# Patient Record
Sex: Female | Born: 1965 | Race: White | Hispanic: No | Marital: Single | Smoking: Never smoker
Health system: Southern US, Community
[De-identification: ages and names within clinical notes are randomized; demographics above are authoritative.]

## PROBLEM LIST (undated history)

## (undated) DIAGNOSIS — F32A Depression, unspecified: Secondary | ICD-10-CM

## (undated) DIAGNOSIS — K219 Gastro-esophageal reflux disease without esophagitis: Secondary | ICD-10-CM

## (undated) DIAGNOSIS — K649 Unspecified hemorrhoids: Secondary | ICD-10-CM

## (undated) DIAGNOSIS — N200 Calculus of kidney: Secondary | ICD-10-CM

## (undated) DIAGNOSIS — T4145XA Adverse effect of unspecified anesthetic, initial encounter: Secondary | ICD-10-CM

## (undated) DIAGNOSIS — R3129 Other microscopic hematuria: Secondary | ICD-10-CM

## (undated) DIAGNOSIS — C801 Malignant (primary) neoplasm, unspecified: Secondary | ICD-10-CM

## (undated) DIAGNOSIS — R3915 Urgency of urination: Secondary | ICD-10-CM

## (undated) DIAGNOSIS — E119 Type 2 diabetes mellitus without complications: Secondary | ICD-10-CM

## (undated) DIAGNOSIS — I10 Essential (primary) hypertension: Secondary | ICD-10-CM

## (undated) DIAGNOSIS — Z8719 Personal history of other diseases of the digestive system: Secondary | ICD-10-CM

## (undated) DIAGNOSIS — Z9889 Other specified postprocedural states: Secondary | ICD-10-CM

## (undated) DIAGNOSIS — Z85820 Personal history of malignant melanoma of skin: Secondary | ICD-10-CM

## (undated) DIAGNOSIS — N201 Calculus of ureter: Secondary | ICD-10-CM

## (undated) DIAGNOSIS — M797 Fibromyalgia: Secondary | ICD-10-CM

## (undated) DIAGNOSIS — T8859XA Other complications of anesthesia, initial encounter: Secondary | ICD-10-CM

## (undated) DIAGNOSIS — F329 Major depressive disorder, single episode, unspecified: Secondary | ICD-10-CM

## (undated) DIAGNOSIS — Z973 Presence of spectacles and contact lenses: Secondary | ICD-10-CM

## (undated) DIAGNOSIS — G43909 Migraine, unspecified, not intractable, without status migrainosus: Secondary | ICD-10-CM

## (undated) HISTORY — PX: TONSILLECTOMY: SUR1361

## (undated) HISTORY — DX: Gastro-esophageal reflux disease without esophagitis: K21.9

## (undated) HISTORY — PX: OTHER SURGICAL HISTORY: SHX169

## (undated) HISTORY — PX: HAND SURGERY: SHX662

## (undated) HISTORY — DX: Type 2 diabetes mellitus without complications: E11.9

## (undated) HISTORY — PX: ABDOMINAL HYSTERECTOMY: SHX81

## (undated) HISTORY — DX: Migraine, unspecified, not intractable, without status migrainosus: G43.909

---

## 2001-05-02 ENCOUNTER — Other Ambulatory Visit: Admission: RE | Admit: 2001-05-02 | Discharge: 2001-05-02 | Payer: Self-pay | Admitting: Obstetrics and Gynecology

## 2003-07-08 ENCOUNTER — Ambulatory Visit (HOSPITAL_COMMUNITY): Admission: RE | Admit: 2003-07-08 | Discharge: 2003-07-08 | Payer: Self-pay | Admitting: Obstetrics and Gynecology

## 2003-07-08 ENCOUNTER — Encounter: Payer: Self-pay | Admitting: Obstetrics and Gynecology

## 2005-03-03 ENCOUNTER — Ambulatory Visit (HOSPITAL_COMMUNITY): Payer: Self-pay | Admitting: Oncology

## 2005-03-03 ENCOUNTER — Encounter (HOSPITAL_COMMUNITY): Admission: RE | Admit: 2005-03-03 | Discharge: 2005-04-02 | Payer: Self-pay | Admitting: Oncology

## 2005-03-03 ENCOUNTER — Encounter: Admission: RE | Admit: 2005-03-03 | Discharge: 2005-03-03 | Payer: Self-pay | Admitting: Oncology

## 2005-05-14 ENCOUNTER — Ambulatory Visit (HOSPITAL_COMMUNITY): Admission: RE | Admit: 2005-05-14 | Discharge: 2005-05-14 | Payer: Self-pay | Admitting: Family Medicine

## 2005-06-15 ENCOUNTER — Ambulatory Visit: Payer: Self-pay | Admitting: Gastroenterology

## 2005-07-29 ENCOUNTER — Encounter (HOSPITAL_COMMUNITY): Admission: RE | Admit: 2005-07-29 | Discharge: 2005-08-28 | Payer: Self-pay | Admitting: Endocrinology

## 2008-12-28 ENCOUNTER — Emergency Department (HOSPITAL_COMMUNITY): Admission: EM | Admit: 2008-12-28 | Discharge: 2008-12-28 | Payer: Self-pay | Admitting: Emergency Medicine

## 2009-02-25 ENCOUNTER — Other Ambulatory Visit: Admission: RE | Admit: 2009-02-25 | Discharge: 2009-02-25 | Payer: Self-pay | Admitting: Obstetrics and Gynecology

## 2009-03-12 ENCOUNTER — Encounter: Payer: Self-pay | Admitting: Obstetrics & Gynecology

## 2009-03-12 ENCOUNTER — Ambulatory Visit (HOSPITAL_COMMUNITY): Admission: RE | Admit: 2009-03-12 | Discharge: 2009-03-12 | Payer: Self-pay | Admitting: Obstetrics & Gynecology

## 2009-03-12 HISTORY — PX: DILATION AND CURETTAGE OF UTERUS: SHX78

## 2009-03-15 ENCOUNTER — Emergency Department (HOSPITAL_COMMUNITY): Admission: EM | Admit: 2009-03-15 | Discharge: 2009-03-15 | Payer: Self-pay | Admitting: Emergency Medicine

## 2010-02-10 ENCOUNTER — Ambulatory Visit: Payer: Self-pay | Admitting: Gastroenterology

## 2010-02-10 ENCOUNTER — Inpatient Hospital Stay (HOSPITAL_COMMUNITY): Admission: AD | Admit: 2010-02-10 | Discharge: 2010-02-13 | Payer: Self-pay | Admitting: Family Medicine

## 2010-02-11 ENCOUNTER — Ambulatory Visit: Payer: Self-pay | Admitting: Gastroenterology

## 2010-02-12 ENCOUNTER — Ambulatory Visit: Payer: Self-pay | Admitting: Gastroenterology

## 2010-02-12 ENCOUNTER — Encounter (INDEPENDENT_AMBULATORY_CARE_PROVIDER_SITE_OTHER): Payer: Self-pay | Admitting: Family Medicine

## 2010-02-12 HISTORY — PX: OTHER SURGICAL HISTORY: SHX169

## 2010-03-02 ENCOUNTER — Encounter: Payer: Self-pay | Admitting: Gastroenterology

## 2010-03-02 ENCOUNTER — Telehealth: Payer: Self-pay | Admitting: Gastroenterology

## 2010-03-04 ENCOUNTER — Ambulatory Visit: Payer: Self-pay | Admitting: Gastroenterology

## 2010-03-04 ENCOUNTER — Ambulatory Visit (HOSPITAL_COMMUNITY): Admission: RE | Admit: 2010-03-04 | Discharge: 2010-03-04 | Payer: Self-pay | Admitting: Gastroenterology

## 2010-03-04 ENCOUNTER — Telehealth: Payer: Self-pay | Admitting: Gastroenterology

## 2010-03-06 ENCOUNTER — Encounter: Payer: Self-pay | Admitting: Gastroenterology

## 2010-03-06 ENCOUNTER — Ambulatory Visit (HOSPITAL_COMMUNITY): Admission: RE | Admit: 2010-03-06 | Discharge: 2010-03-06 | Payer: Self-pay | Admitting: Gastroenterology

## 2010-03-12 ENCOUNTER — Encounter: Payer: Self-pay | Admitting: Gastroenterology

## 2010-03-18 ENCOUNTER — Ambulatory Visit: Payer: Self-pay | Admitting: Gastroenterology

## 2010-05-05 ENCOUNTER — Ambulatory Visit: Payer: Self-pay | Admitting: Gastroenterology

## 2010-05-05 ENCOUNTER — Encounter (INDEPENDENT_AMBULATORY_CARE_PROVIDER_SITE_OTHER): Payer: Self-pay

## 2010-05-05 DIAGNOSIS — R1084 Generalized abdominal pain: Secondary | ICD-10-CM | POA: Insufficient documentation

## 2010-05-05 DIAGNOSIS — R197 Diarrhea, unspecified: Secondary | ICD-10-CM | POA: Insufficient documentation

## 2010-06-08 ENCOUNTER — Encounter: Payer: Self-pay | Admitting: Gastroenterology

## 2010-06-19 ENCOUNTER — Encounter: Payer: Self-pay | Admitting: Gastroenterology

## 2010-10-25 HISTORY — PX: CYST EXCISION: SHX5701

## 2010-11-24 NOTE — Letter (Signed)
Summary: GIVENS ORDER  GIVNES ORDER   Imported By: Ave Filter 03/04/2010 11:28:54  _____________________________________________________________________  External Attachment:    Type:   Image     Comment:   External Document

## 2010-11-24 NOTE — Progress Notes (Signed)
Summary: RECTAL BLEEDING  Called by Dr. Sudie Bailey. TCS WED 5/11-suprep, Dx: rectal bleeding. West Bali MD  Mar 02, 2010 10:10 AM  Appended Document: RECTAL BLEEDING Pt is on the schedule for 03/04/10@10 :45a.m.

## 2010-11-24 NOTE — Progress Notes (Signed)
Summary: CAPSULE ENDOSCOPY  Pt having capsule endoscopy Friday 03/06/10. Please read capsule. Pt has had a finding of enteritis on CT APR 2011. She continues with abd pain and loose stools. IleoTCS and enteroscopy revealed only H. pylori gastritis. West Bali MD  Mar 04, 2010 10:47 AM

## 2010-11-24 NOTE — Consult Note (Signed)
Summary: Consultation Report  Consultation Report   Imported By: Peggyann Shoals 03/06/2010 13:13:49  _____________________________________________________________________  External Attachment:    Type:   Image     Comment:   External Document  Appended Document: Consultation Report H. Pylori serologies positive in 4/11 hospitalization. Gastric bx also positive. Please find out if patient was treated.  Appended Document: Consultation Report EGD path from 4/11 needs to be in computer please.  Appended Document: Consultation Report LMOM to call.  Appended Document: Consultation Report Path Report scanned into emr.  Appended Document: Consultation Report Pt said she was treated for the H. Pylori with Amoxicillin, Biaxin, and Protonix.

## 2010-11-24 NOTE — Letter (Signed)
Summary: RECORDS FROM [facility] FROM [facility]   Imported By: Rexene Alberts 06/08/2010 12:27:13  _____________________________________________________________________  External Attachment:    Type:   Image     Comment:   External Document

## 2010-11-24 NOTE — Letter (Signed)
Summary: TRIAGE ORDER  TRIAGE ORDER   Imported By: Ave Filter 03/02/2010 11:07:24  _____________________________________________________________________  External Attachment:    Type:   Image     Comment:   External Document

## 2010-11-24 NOTE — Letter (Signed)
Summary: DISABILITY DETERMINATION  DISABILITY DETERMINATION   Imported By: Rexene Alberts 06/19/2010 15:03:50  _____________________________________________________________________  External Attachment:    Type:   Image     Comment:   External Document

## 2010-11-24 NOTE — Letter (Signed)
Summary: [facility] REFERRAL  [facility] REFERRAL   Imported By: Ave Filter 03/18/2010 15:26:34  _____________________________________________________________________  External Attachment:    Type:   Image     Comment:   External Document

## 2010-11-24 NOTE — Letter (Signed)
Summary: Out of Work Note  [facility]  [address]   [place], [place] [address]   Phone: [phone]  Fax: [phone]    05/05/2010  TO: Leodis Sias IT MAY CONCERN  RE: Courtney Hale [address] [place] Nov 19, 1965       The above named individual was seen in our office today.    If you have any further questions or need additional information, please call.     Sincerely,     [facility] R. Roetta Sessions, M.D.    Jonette Eva, M.D. Lorenza Burton, FNP-BC    Tana Coast, PA-C Phone: [phone]    Fax: [phone]

## 2010-11-24 NOTE — Assessment & Plan Note (Signed)
Summary: ABD PAIN, NVD likely 2o to IBS   Visit Type:  Follow-up Visit Primary Care Provider:  Sudie Bailey, M.D.  Chief Complaint:  follow up.  History of Present Illness: Pt still has bloating and pain. Bms: 12-13, 4-5 times per night: not a whole lot. Formed stool: 2x/week. No normal solid stool. No fever or chills. Has had vomtiing and nausea since she went back to work. Weight: 150s when first got sick and now gained  ~20 lbs. No heartburn or indigestion. No blood in stool or black stools. Appetite not good. "Doesn't eat much due to bloating". 10-15 mins after eats must be near a BR. On minocycline: June 9 for places on her arms: no change in stools, then last couple days NV. Taking ?Fiber pills: 3 every day-doesn't make bloating worse. No ice cream, rare cheese, or 2-3 cups a week: milk. [facility]: JUL 22.  Current Medications (verified): 1)  Minocycline Hcl 50 Mg Caps (Minocycline Hcl) .... Two Times A Day 2)  Protonix 40 Mg Tbec (Pantoprazole Sodium) .... Once Daily 3)  Zolpidem Tartrate 10 Mg Tabs (Zolpidem Tartrate) .... At Bedtime 4)  Ortho Tri-Cyclen (28) 0.18/0.215/0.25 Mg-35 Mcg Tabs (Norgestim-Eth Estrad Triphasic) .... Once Daily 5)  Citalopram Hydrobromide 20 Mg Tabs (Citalopram Hydrobromide) .... Once Daily 6)  Lisinopril 10 Mg Tabs (Lisinopril) .... Once Daily 7)  Clobetasol Propionade 0.5% .... Once Daily  Allergies (verified): No Known Drug Allergies  Past History:  Past Medical History: Epigstric pain, NV APR 2011:  CTAP w/ ivc- Diffuse thickening of small bowel loops in the mid abdomen, likely   mid to distal jejunum, with edema in the associated mesentery **PUSH ENTEROSCOPY APR 2011 unable to reach the jejunum; ILEOTCS 2011-NO LESIONS. Capsule endoscopy MAY 2011: SB ulcer at approximately 2 hours; pending DBE AT [facility] for small bowl enteritis. H. PYLORI GASTRITIS APR 2011-Rx by Dr. Sudie Bailey Skin lesions on body Right hepatic lobe hemangioma MRI 2006  Past Surgical  History: MAR 2010: D&C 2o to Miscarriage No Cholecystectomy  Vital Signs:  Patient profile:   45 year old female Height:      63.5 inches Weight:      175 pounds BMI:     30.62 Temp:     98.7 degrees F oral Pulse rate:   76 / minute BP sitting:   124 / 88  (left arm) Cuff size:   regular  Vitals Entered By: Hendricks Limes LPN (May 05, 2010 8:43 AM)  Physical Exam  General:  Well developed, well nourished, no acute distress. Head:  Normocephalic and atraumatic. Eyes:  PERRLA, no icterus. Mouth:  No deformity or lesions. Lungs:  Clear throughout to auscultation. Heart:  Regular rate and rhythm; no murmurs. Abdomen:  Soft, mild TTPx4 without guarding and without rebound, nondistended. Normal bowel sounds. obese.  Extremities:  No edema noted. Neurologic:  Alert and  oriented x4;  grossly normal neurologically. Skin:  Without significant lesions or rashes in the pretibial region.  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 SB enteritis still remains in differential diagnosis. Doubt SBBO. Continue fiber. Add Probiotics daily. Add Bentyl 10 mg 30 minutes breakfast, lunch and dinner. Med side effects discussed. Cut out dairy products. lactose free hand out given. Continue Protonix. Return in 2 mos. Will await results from DBE. NEXT UPPER ENDOSCOPY WITH PROPOFOL.  Orders: Est. Patient Level IV ([address]109)  Problem # 2:  DIARRHEA (ICD-787.91)  See #1.  CC: PCP  Orders: Est. Patient Level IV (29562)  Patient Instructions: 1)  Add Probiotics daily. 2)  Add Bentyl 10 mg 30 minutes breakfast, lunch and dinner. 3)  Cut out dairy products. 4)  Continue Protonix. 5)  Return in 2 mos. 6)  The medication list was reviewed and reconciled.  All changed / newly prescribed medications were explained.  A complete medication list was provided to the patient / caregiver. Prescriptions: DICYCLOMINE HCL 10 MG CAPS  (DICYCLOMINE HCL) 1 by mouth 30 minutes prior to breakfast, lunch, and dinner  #90 x 5   Entered and Authorized by:   West Bali MD   Signed by:   West Bali MD on 05/05/2010   Method used:   Electronically to        [facility]  [address] (retail)       [address]       [place], [place]  [address]       Ph: [phone]       Fax: [phone]   RxID:   [id number]   Appended Document: ABD PAIN, NVD likely 2o to IBS REMINDER APPOINTMENT IN THE COMPUTER

## 2010-11-24 NOTE — Op Note (Signed)
Summary: Operative Report  Operative Report   Imported By: Peggyann Shoals 03/06/2010 13:12:22  _____________________________________________________________________  External Attachment:    Type:   Image     Comment:   External Document

## 2011-01-12 LAB — DIFFERENTIAL
Basophils Absolute: 0 10*3/uL (ref 0.0–0.1)
Basophils Relative: 0 % (ref 0–1)
Eosinophils Absolute: 0 10*3/uL (ref 0.0–0.7)
Eosinophils Relative: 0 % (ref 0–5)
Lymphocytes Relative: 12 % (ref 12–46)
Lymphs Abs: 1.5 10*3/uL (ref 0.7–4.0)
Monocytes Absolute: 0.6 10*3/uL (ref 0.1–1.0)
Monocytes Relative: 4 % (ref 3–12)
Neutro Abs: 10.8 10*3/uL — ABNORMAL HIGH (ref 1.7–7.7)
Neutrophils Relative %: 83 % — ABNORMAL HIGH (ref 43–77)

## 2011-01-12 LAB — BASIC METABOLIC PANEL
BUN: 9 mg/dL (ref 6–23)
CO2: 23 mEq/L (ref 19–32)
Calcium: 8.7 mg/dL (ref 8.4–10.5)
Chloride: 104 mEq/L (ref 96–112)
Creatinine, Ser: 0.61 mg/dL (ref 0.4–1.2)
GFR calc Af Amer: >60 mL/min (ref >60)
GFR calc non Af Amer: >60 mL/min (ref >60)
Glucose, Bld: 105 mg/dL — ABNORMAL HIGH (ref 70–99)
Potassium: 3.6 mEq/L (ref 3.5–5.1)
Sodium: 134 mEq/L — ABNORMAL LOW (ref 135–145)

## 2011-01-12 LAB — CULTURE, BLOOD (ROUTINE X 2)
Culture: NO GROWTH
Culture: NO GROWTH
Report Status: 4242011
Report Status: 4242011

## 2011-01-12 LAB — PREGNANCY, URINE: Preg Test, Ur: NEGATIVE

## 2011-01-12 LAB — CBC
HCT: 36.6 % (ref 36.0–46.0)
Hemoglobin: 12.9 g/dL (ref 12.0–15.0)
MCHC: 35.2 g/dL (ref 30.0–36.0)
MCV: 84.8 fL (ref 78.0–100.0)
Platelets: 266 10*3/uL (ref 150–400)
RBC: 4.31 MIL/uL (ref 3.87–5.11)
RDW: 14.7 % (ref 11.5–15.5)
WBC: 12.9 10*3/uL — ABNORMAL HIGH (ref 4.0–10.5)

## 2011-01-12 LAB — URINALYSIS, ROUTINE W REFLEX MICROSCOPIC
Bilirubin Urine: NEGATIVE
Glucose, UA: NEGATIVE mg/dL
Hgb urine dipstick: NEGATIVE
Ketones, ur: NEGATIVE mg/dL
Nitrite: NEGATIVE
Protein, ur: NEGATIVE mg/dL
Specific Gravity, Urine: <1.005 — ABNORMAL LOW (ref 1.005–1.030)
Urobilinogen, UA: 0.2 mg/dL (ref 0.0–1.0)
pH: 6.5 (ref 5.0–8.0)

## 2011-01-12 LAB — URINE CULTURE

## 2011-01-12 LAB — C-REACTIVE PROTEIN: CRP: 0.4 mg/dL — ABNORMAL LOW (ref <0.6)

## 2011-01-12 LAB — H. PYLORI ANTIBODY, IGG: H Pylori IgG: 7 {ISR} — ABNORMAL HIGH

## 2011-01-12 LAB — SEDIMENTATION RATE: Sed Rate: 14 mm/hr (ref 0–22)

## 2011-02-02 LAB — URINALYSIS, MICROSCOPIC ONLY
Leukocytes, UA: NEGATIVE
Nitrite: NEGATIVE
Specific Gravity, Urine: 1.015 (ref 1.005–1.030)
Urine-Other: NONE SEEN
Urobilinogen, UA: 0.2 mg/dL (ref 0.0–1.0)
pH: 6.5 (ref 5.0–8.0)

## 2011-02-02 LAB — COMPREHENSIVE METABOLIC PANEL
ALT: 22 U/L (ref 0–35)
AST: 20 U/L (ref 0–37)
AST: 23 U/L (ref 0–37)
Albumin: 3.2 g/dL — ABNORMAL LOW (ref 3.5–5.2)
Alkaline Phosphatase: 51 U/L (ref 39–117)
BUN: 4 mg/dL — ABNORMAL LOW (ref 6–23)
BUN: 8 mg/dL (ref 6–23)
CO2: 23 mEq/L (ref 19–32)
Calcium: 9.1 mg/dL (ref 8.4–10.5)
Chloride: 103 mEq/L (ref 96–112)
Chloride: 107 mEq/L (ref 96–112)
Creatinine, Ser: 0.51 mg/dL (ref 0.4–1.2)
GFR calc Af Amer: >60 mL/min (ref >60)
GFR calc Af Amer: >60 mL/min (ref >60)
GFR calc non Af Amer: >60 mL/min (ref >60)
Potassium: 3.7 mEq/L (ref 3.5–5.1)
Sodium: 138 mEq/L (ref 135–145)
Total Bilirubin: 0.4 mg/dL (ref 0.3–1.2)
Total Protein: 5.9 g/dL — ABNORMAL LOW (ref 6.0–8.3)

## 2011-02-02 LAB — POCT CARDIAC MARKERS
CKMB, poc: <1 ng/mL — ABNORMAL LOW (ref 1.0–8.0)
Troponin i, poc: <0.05 ng/mL (ref 0.00–0.09)

## 2011-02-02 LAB — CBC
HCT: 35.3 % — ABNORMAL LOW (ref 36.0–46.0)
Hemoglobin: 12.7 g/dL (ref 12.0–15.0)
MCHC: 36.1 g/dL — ABNORMAL HIGH (ref 30.0–36.0)
MCV: 88.3 fL (ref 78.0–100.0)
Platelets: 235 10*3/uL (ref 150–400)
Platelets: 268 10*3/uL (ref 150–400)
RBC: 4 MIL/uL (ref 3.87–5.11)
RDW: 12.9 % (ref 11.5–15.5)
RDW: 13.2 % (ref 11.5–15.5)
WBC: 5.5 10*3/uL (ref 4.0–10.5)
WBC: 7 10*3/uL (ref 4.0–10.5)

## 2011-02-02 LAB — DIFFERENTIAL
Basophils Relative: 1 % (ref 0–1)
Eosinophils Relative: 1 % (ref 0–5)
Monocytes Absolute: 0.4 10*3/uL (ref 0.1–1.0)
Monocytes Relative: 7 % (ref 3–12)
Neutro Abs: 3.4 10*3/uL (ref 1.7–7.7)

## 2011-02-02 LAB — BRAIN NATRIURETIC PEPTIDE: Pro B Natriuretic peptide (BNP): 95.6 pg/mL (ref 0.0–100.0)

## 2011-02-04 LAB — PREGNANCY, URINE: Preg Test, Ur: POSITIVE

## 2011-02-04 LAB — CBC
HCT: 34.5 % — ABNORMAL LOW (ref 36.0–46.0)
MCHC: 34.3 g/dL (ref 30.0–36.0)
MCV: 91.1 fL (ref 78.0–100.0)
Platelets: 273 10*3/uL (ref 150–400)
RDW: 13.1 % (ref 11.5–15.5)
WBC: 7.3 10*3/uL (ref 4.0–10.5)

## 2011-02-04 LAB — BASIC METABOLIC PANEL
BUN: 8 mg/dL (ref 6–23)
Chloride: 106 mEq/L (ref 96–112)
Creatinine, Ser: 0.5 mg/dL (ref 0.4–1.2)
Glucose, Bld: 106 mg/dL — ABNORMAL HIGH (ref 70–99)

## 2011-02-04 LAB — DIFFERENTIAL
Basophils Absolute: 0 10*3/uL (ref 0.0–0.1)
Basophils Relative: 1 % (ref 0–1)
Eosinophils Absolute: 0.1 10*3/uL (ref 0.0–0.7)
Eosinophils Relative: 1 % (ref 0–5)
Lymphs Abs: 1.6 10*3/uL (ref 0.7–4.0)
Neutrophils Relative %: 70 % (ref 43–77)

## 2011-02-04 LAB — HCG, QUANTITATIVE, PREGNANCY: hCG, Beta Chain, Quant, S: 3546 m[IU]/mL — ABNORMAL HIGH (ref <5)

## 2011-03-09 NOTE — Op Note (Signed)
NAME:  Courtney Hale, Courtney Hale               ACCOUNT NO.:  [id number]   MEDICAL RECORD NO.:  [id number]          PATIENT TYPE:  AMB   LOCATION:  DAY                           FACILITY:  [facility]   PHYSICIAN:  Lazaro Arms, M.D.   DATE OF BIRTH:  [birth year]   DATE OF PROCEDURE:  03/12/2009  DATE OF DISCHARGE:  03/12/2009                               OPERATIVE REPORT   PREOPERATIVE DIAGNOSIS:  Missed abortion in the first trimester.   POSTOPERATIVE DIAGNOSIS:  Missed abortion in the first trimester.   PROCEDURE:  Cervical dilation with dilation and curettage.   SURGEON:  Lazaro Arms, MD   ANESTHESIA:  Laryngeal mask airway, general.   FINDINGS:  The patient had been seen in the office and had downward  trending hCG, no fetal cardiac activity after seeing cardiac activity  and a  sac that was becoming irregular.  The patient decided she want to  have D and C.  The uterus sounded to about 10 cm in the OR.  No  abnormalities.   DESCRIPTION OF OPERATION:  The patient was taken to the operating room,  placed in supine position, underwent general endotracheal anesthesia.  Placed in the lithotomy position, prepped and draped in usual sterile  fashion.  Cervix was grasped, dilated serially to allow passage of a #9  curved French suction curette.  Several passes were made, tissue was  removed.  Good uterine cry was obtained.  There was minimal bleed and  the patient was given Methergine IM.  She was taken to recovery room in  good and stable condition.  All counts were correct.  Blood loss was  minimal.      Lazaro Arms, M.D.  Electronically Signed     LHE/MEDQ  D:  03/12/2009  T:  03/13/2009  Job:  [id number]

## 2011-10-03 ENCOUNTER — Emergency Department (HOSPITAL_COMMUNITY)
Admission: EM | Admit: 2011-10-03 | Discharge: 2011-10-03 | Disposition: A | Discharge status: Home / Self Care | Payer: BC Managed Care – PPO | Attending: Emergency Medicine | Admitting: Emergency Medicine

## 2011-10-03 ENCOUNTER — Emergency Department (HOSPITAL_COMMUNITY): Payer: BC Managed Care – PPO

## 2011-10-03 DIAGNOSIS — T3795XA Adverse effect of unspecified systemic anti-infective and antiparasitic, initial encounter: Secondary | ICD-10-CM | POA: Insufficient documentation

## 2011-10-03 DIAGNOSIS — J111 Influenza due to unidentified influenza virus with other respiratory manifestations: Secondary | ICD-10-CM | POA: Insufficient documentation

## 2011-10-03 DIAGNOSIS — Z85820 Personal history of malignant melanoma of skin: Secondary | ICD-10-CM | POA: Insufficient documentation

## 2011-10-03 DIAGNOSIS — L270 Generalized skin eruption due to drugs and medicaments taken internally: Secondary | ICD-10-CM | POA: Insufficient documentation

## 2011-10-03 LAB — COMPREHENSIVE METABOLIC PANEL
ALT: 59 U/L — ABNORMAL HIGH (ref 0–35)
AST: 68 U/L — ABNORMAL HIGH (ref 0–37)
Albumin: 3.9 g/dL (ref 3.5–5.2)
Calcium: 9.1 mg/dL (ref 8.4–10.5)
Creatinine, Ser: 0.56 mg/dL (ref 0.50–1.10)
GFR calc non Af Amer: >90 mL/min (ref >90)
Sodium: 134 mEq/L — ABNORMAL LOW (ref 135–145)
Total Protein: 7.3 g/dL (ref 6.0–8.3)

## 2011-10-03 LAB — URINE MICROSCOPIC-ADD ON

## 2011-10-03 LAB — URINALYSIS, ROUTINE W REFLEX MICROSCOPIC
Bilirubin Urine: NEGATIVE
Specific Gravity, Urine: >1.03 — ABNORMAL HIGH (ref 1.005–1.030)
Urobilinogen, UA: 0.2 mg/dL (ref 0.0–1.0)
pH: 6 (ref 5.0–8.0)

## 2011-10-03 LAB — CBC
Hemoglobin: 13.4 g/dL (ref 12.0–15.0)
MCH: 31 pg (ref 26.0–34.0)
MCV: 90 fL (ref 78.0–100.0)
Platelets: 222 10*3/uL (ref 150–400)
RBC: 4.32 MIL/uL (ref 3.87–5.11)
WBC: 4.1 10*3/uL (ref 4.0–10.5)

## 2011-10-03 LAB — RAPID STREP SCREEN (MED CTR MEBANE ONLY): Streptococcus, Group A Screen (Direct): NEGATIVE

## 2011-10-03 MED ORDER — ONDANSETRON HCL 4 MG/2ML IJ SOLN
4.0000 mg | Freq: Once | INTRAMUSCULAR | Status: AC
  Administered 2011-10-03: 4 mg via INTRAVENOUS
  Filled 2011-10-03: qty 2

## 2011-10-03 MED ORDER — SODIUM CHLORIDE 0.9 % IV SOLN: INTRAVENOUS | Status: DC

## 2011-10-03 MED ORDER — HYDROCODONE-ACETAMINOPHEN 5-325 MG PO TABS: 1.0000 | ORAL_TABLET | ORAL | Status: AC | PRN

## 2011-10-03 MED ORDER — FAMOTIDINE IN NACL 20-0.9 MG/50ML-% IV SOLN
20.0000 mg | Freq: Once | INTRAVENOUS | Status: AC
  Administered 2011-10-03: 20 mg via INTRAVENOUS
  Filled 2011-10-03: qty 50

## 2011-10-03 MED ORDER — DIPHENHYDRAMINE HCL 50 MG/ML IJ SOLN
25.0000 mg | Freq: Once | INTRAMUSCULAR | Status: AC
  Administered 2011-10-03: 25 mg via INTRAVENOUS
  Filled 2011-10-03: qty 1

## 2011-10-03 MED ORDER — SODIUM CHLORIDE 0.9 % IV BOLUS (SEPSIS)
1000.0000 mL | Freq: Once | INTRAVENOUS | Status: AC
  Administered 2011-10-03: 1000 mL via INTRAVENOUS

## 2011-10-03 MED ORDER — FAMOTIDINE 20 MG PO TABS: 20.0000 mg | ORAL_TABLET | Freq: Two times a day (BID) | ORAL | Status: DC

## 2011-10-03 MED ORDER — HYDROMORPHONE HCL PF 1 MG/ML IJ SOLN
1.0000 mg | Freq: Once | INTRAMUSCULAR | Status: AC
  Administered 2011-10-03: 1 mg via INTRAVENOUS
  Filled 2011-10-03: qty 1

## 2011-10-03 MED ORDER — DIPHENHYDRAMINE HCL 25 MG PO TABS: 25.0000 mg | ORAL_TABLET | Freq: Four times a day (QID) | ORAL | Status: AC

## 2011-10-03 NOTE — ED Provider Notes (Signed)
History   This chart was scribed for Courtney Jakes, MD scribed by [name]. The patient was seen in room [facility]    CSN: [id number] Arrival date & time: 10/03/2011 11:41 AM   First MD Initiated Contact with Patient 10/03/11 1205      Chief Complaint  Patient presents with  . Rash  . Shortness of Breath  . Dysphagia  . Cough  . Generalized Body Aches    (Consider location/radiation/quality/duration/timing/severity/associated sxs/prior treatment) Patient is a 45 y.o. female presenting with allergic reaction. The history is provided by the patient. No language interpreter was used.  Allergic Reaction The primary symptoms are  cough and rash. The current episode started 13 to 24 hours ago. The problem has not changed since onset.This is a new problem.  The rash appears on the torso, face, back, chest, left arm, right arm, right leg and right foot. Risk factors for rash include new medications (Started 10 day course of Septra 11/29. Started Tamiflu last night. ).  The onset of the reaction was associated with a new medication. Associated symptoms comments: Burning senstation.   Pt seen at 1:01 PM Pt was sen on 11/29 by PCP Dr. Gerda Diss for congestion and cough, dx with sinus infection and placed on Septra. 3 days ago Pt reports her Sx worsened and she developed a high fever ~103, chills, and myalgias. Pt called PCP and was Rx'd with tamiflu. Last night Pt took tamiflu, cough syrup and Septra and woke with a severe, generalized rash. Pt reports taking her last dose of Septra and a second dose of tamiflu this morning. Pt currently c/o of rash, fever, chills, cough, and myalgias.    Past Medical History  Diagnosis Date  . Melanoma     Past Surgical History  Procedure Date  . Melanoma excision     No family history on file.  History  Substance Use Topics  . Smoking status: Never Smoker   . Smokeless tobacco: Not on file  . Alcohol Use: No    Review of Systems    Constitutional: Positive for fever and chills.  HENT: Positive for trouble swallowing.   Respiratory: Positive for cough.   Musculoskeletal: Positive for myalgias.  Skin: Positive for rash.  All other systems reviewed and are negative.    Allergies  Review of patient's allergies indicates no known allergies.  Home Medications   Current Outpatient Rx  Name Route Sig Dispense Refill  . LISINOPRIL 20 MG PO TABS Oral Take 20 mg by mouth daily.      . OSELTAMIVIR PHOSPHATE 75 MG PO CAPS Oral Take 75 mg by mouth 2 (two) times daily.      . SULFAMETHOXAZOLE-TMP DS 800-160 MG PO TABS Oral Take 1 tablet by mouth 2 (two) times daily. Patient started on 09/23/11 for 10 days.     Marland Kitchen DIPHENHYDRAMINE HCL 25 MG PO TABS Oral Take 1 tablet (25 mg total) by mouth every 6 (six) hours. 20 tablet 0  . FAMOTIDINE 20 MG PO TABS Oral Take 1 tablet (20 mg total) by mouth 2 (two) times daily. 30 tablet 0    BP 137/81  Pulse 110  Temp(Src) 101.1 F (38.4 C) (Oral)  Resp 20  Ht 5\' 3"  (1.6 m)  Wt 173 lb (78.472 kg)  BMI 30.65 kg/m2  SpO2 99%  LMP 09/30/2011  Physical Exam  Nursing note and vitals reviewed. Constitutional: She is oriented to person, place, and time. She appears well-developed and well-nourished. No distress.  HENT:  Head: Normocephalic and atraumatic.  Mouth/Throat: Oropharynx is clear and moist. No oropharyngeal exudate.  Eyes: EOM are normal. Pupils are equal, round, and reactive to light.       Conjunctiva is erythematous    Cardiovascular: Normal rate, regular rhythm and normal heart sounds.   Pulmonary/Chest: Effort normal and breath sounds normal.  Abdominal: Soft. Bowel sounds are normal. There is no tenderness.  Musculoskeletal: Normal range of motion.  Neurological: She is alert and oriented to person, place, and time. No cranial nerve deficit. Coordination normal.  Skin: Skin is warm. Rash noted. There is erythema (.).       Macular erythemas rash, which does blanch.  Most intense on her anterior chest and face, but present everywhere.  Psychiatric: She has a normal mood and affect.    ED Course  Procedures (including critical care time) DIAGNOSTIC STUDIES: Oxygen Saturation is 99% on room air, normal by my interpretation.    COORDINATION OF CARE:  Results for orders placed during the hospital encounter of 10/03/11  COMPREHENSIVE METABOLIC PANEL      Component Value Range   Sodium 134 (*) 135 - 145 (mEq/L)   Potassium 3.9  3.5 - 5.1 (mEq/L)   Chloride 102  96 - 112 (mEq/L)   CO2 21  19 - 32 (mEq/L)   Glucose, Bld 122 (*) 70 - 99 (mg/dL)   BUN 8  6 - 23 (mg/dL)   Creatinine, Ser 4.09  0.50 - 1.10 (mg/dL)   Calcium 9.1  8.4 - 81.1 (mg/dL)   Total Protein 7.3  6.0 - 8.3 (g/dL)   Albumin 3.9  3.5 - 5.2 (g/dL)   AST 68 (*) 0 - 37 (U/L)   ALT 59 (*) 0 - 35 (U/L)   Alkaline Phosphatase 125 (*) 39 - 117 (U/L)   Total Bilirubin 0.2 (*) 0.3 - 1.2 (mg/dL)   GFR calc non Af Amer >90  >90 (mL/min)   GFR calc Af Amer >90  >90 (mL/min)  URINALYSIS, ROUTINE W REFLEX MICROSCOPIC      Component Value Range   Color, Urine YELLOW  YELLOW    APPearance CLOUDY (*) CLEAR    Specific Gravity, Urine >1.030 (*) 1.005 - 1.030    pH 6.0  5.0 - 8.0    Glucose, UA NEGATIVE  NEGATIVE (mg/dL)   Hgb urine dipstick LARGE (*) NEGATIVE    Bilirubin Urine NEGATIVE  NEGATIVE    Ketones, ur NEGATIVE  NEGATIVE (mg/dL)   Protein, ur TRACE (*) NEGATIVE (mg/dL)   Urobilinogen, UA 0.2  0.0 - 1.0 (mg/dL)   Nitrite NEGATIVE  NEGATIVE    Leukocytes, UA NEGATIVE  NEGATIVE   CBC      Component Value Range   WBC 4.1  4.0 - 10.5 (K/uL)   RBC 4.32  3.87 - 5.11 (MIL/uL)   Hemoglobin 13.4  12.0 - 15.0 (g/dL)   HCT 91.4  78.2 - 95.6 (%)   MCV 90.0  78.0 - 100.0 (fL)   MCH 31.0  26.0 - 34.0 (pg)   MCHC 34.4  30.0 - 36.0 (g/dL)   RDW 21.3  08.6 - 57.8 (%)   Platelets 222  150 - 400 (K/uL)  RAPID STREP SCREEN      Component Value Range   Streptococcus, Group A Screen (Direct)  NEGATIVE  NEGATIVE   URINE MICROSCOPIC-ADD ON      Component Value Range   Squamous Epithelial / LPF RARE  RARE    WBC, UA 3-6  <  3 (WBC/hpf)   RBC / HPF 21-50  <3 (RBC/hpf)   Bacteria, UA FEW (*) RARE    Dg Chest 2 View  10/03/2011  *RADIOLOGY REPORT*  Clinical Data: Cough, fever, malaise.  CHEST - 2 VIEW  Comparison:  05/14/2005  Findings:  The heart size and mediastinal contours are within normal limits.  Both lungs are clear.  The visualized skeletal structures are unremarkable.  IMPRESSION: No active cardiopulmonary disease.  Original Report Authenticated By: Danae Orleans, M.D.     ED MEDICATIONS  Medications  0.9 %  sodium chloride infusion   sodium chloride 0.9 % bolus 1,000 mL   ondansetron (ZOFRAN) injection 4 mg  HYDROmorphone (DILAUDID) injection 1 mg   famotidine (PEPCID) IVPB 20 mg   diphenhydrAMINE (BENADRYL) injection 25 mg      1. Allergic drug rash due to sulfonamide   2. Influenza       MDM   Allergic drug reaction to sulfur, with skin rash, no hives no lip or tongue swelling no blood pressure problems no difficulty breathing. Patient just completed 10 day course of Septra the drug a [place] started approximately 2 days ago. She also would most likely onset of influenza, started on Tamiflu by her primary care. The Septra was for a questionable sinus infection. Patient does not have a history consistent with [facility] spotted fever and the rash is not consistent with a petechial kind of rash. Most fitting for a sulfur skin drug rash. No evidence of any blind or blistering. Patient white count is on the low side which is fitting with a viral process. Electrolytes without any specific findings. Urinalysis with 21-50 red blood cells of the bacteria is rare and leukocyte esterase is negative. Do not feel this is consistent with a UTI, but patient's menses. Patient observed in the emergency department is improved with fluids currently nontoxic no acute distress    I  personally performed the services described in this documentation, which was scribed in my presence. The recorded information has been reviewed and considered.          Courtney Jakes, MD 10/03/11 [phone]

## 2011-10-03 NOTE — ED Notes (Signed)
Pt presents with rash and burning pain to skin. Pt was given Bactrim for URI last week. Pt did not get any better and was given Tamiflu yesterday. Shortly after taking 1st does pt started breaking out in rash.

## 2011-10-03 NOTE — ED Notes (Signed)
Pt c/o red burning rash to face and over body. Pt states she first noticed burning prior to taking first dose of Tamiflu. Pt states she has been taking several doses of 2gm tylenol a day for fever. Risk of tylenol dose explained to pt. Pt voiced understanding.

## 2011-10-03 NOTE — ED Notes (Signed)
Pt also reports difficulty swallowing and SOB. NAD at this time. Pt states she is becoming nauseated.

## 2012-12-05 ENCOUNTER — Emergency Department (HOSPITAL_COMMUNITY)
Admission: EM | Admit: 2012-12-05 | Discharge: 2012-12-05 | Disposition: A | Discharge status: Home / Self Care | Payer: BC Managed Care – PPO | Attending: Emergency Medicine | Admitting: Emergency Medicine

## 2012-12-05 ENCOUNTER — Encounter (HOSPITAL_COMMUNITY): Payer: Self-pay | Admitting: *Deleted

## 2012-12-05 DIAGNOSIS — R5381 Other malaise: Secondary | ICD-10-CM | POA: Insufficient documentation

## 2012-12-05 DIAGNOSIS — F329 Major depressive disorder, single episode, unspecified: Secondary | ICD-10-CM | POA: Insufficient documentation

## 2012-12-05 DIAGNOSIS — R51 Headache: Secondary | ICD-10-CM | POA: Insufficient documentation

## 2012-12-05 DIAGNOSIS — R5383 Other fatigue: Secondary | ICD-10-CM | POA: Insufficient documentation

## 2012-12-05 DIAGNOSIS — I10 Essential (primary) hypertension: Secondary | ICD-10-CM | POA: Insufficient documentation

## 2012-12-05 DIAGNOSIS — Z85820 Personal history of malignant melanoma of skin: Secondary | ICD-10-CM | POA: Insufficient documentation

## 2012-12-05 DIAGNOSIS — F3289 Other specified depressive episodes: Secondary | ICD-10-CM | POA: Insufficient documentation

## 2012-12-05 DIAGNOSIS — R110 Nausea: Secondary | ICD-10-CM | POA: Insufficient documentation

## 2012-12-05 DIAGNOSIS — Z79899 Other long term (current) drug therapy: Secondary | ICD-10-CM | POA: Insufficient documentation

## 2012-12-05 HISTORY — DX: Essential (primary) hypertension: I10

## 2012-12-05 HISTORY — DX: Depression, unspecified: F32.A

## 2012-12-05 HISTORY — DX: Major depressive disorder, single episode, unspecified: F32.9

## 2012-12-05 LAB — CBC WITH DIFFERENTIAL/PLATELET
Basophils Absolute: 0 10*3/uL (ref 0.0–0.1)
Basophils Relative: 0 % (ref 0–1)
Eosinophils Absolute: 0.1 10*3/uL (ref 0.0–0.7)
HCT: 36 % (ref 36.0–46.0)
Hemoglobin: 12.5 g/dL (ref 12.0–15.0)
MCH: 30 pg (ref 26.0–34.0)
MCHC: 34.7 g/dL (ref 30.0–36.0)
Monocytes Absolute: 0.4 10*3/uL (ref 0.1–1.0)
Monocytes Relative: 7 % (ref 3–12)
Neutrophils Relative %: 57 % (ref 43–77)
RDW: 13 % (ref 11.5–15.5)

## 2012-12-05 LAB — COMPREHENSIVE METABOLIC PANEL
Albumin: 3.7 g/dL (ref 3.5–5.2)
BUN: 6 mg/dL (ref 6–23)
Calcium: 8.9 mg/dL (ref 8.4–10.5)
Creatinine, Ser: 0.5 mg/dL (ref 0.50–1.10)
Total Protein: 7 g/dL (ref 6.0–8.3)

## 2012-12-05 MED ORDER — METOCLOPRAMIDE HCL 5 MG/ML IJ SOLN
10.0000 mg | Freq: Once | INTRAMUSCULAR | Status: AC
  Administered 2012-12-05: 10 mg via INTRAVENOUS
  Filled 2012-12-05: qty 2

## 2012-12-05 MED ORDER — DIPHENHYDRAMINE HCL 50 MG/ML IJ SOLN
25.0000 mg | Freq: Once | INTRAMUSCULAR | Status: AC
  Administered 2012-12-05: 25 mg via INTRAVENOUS
  Filled 2012-12-05: qty 1

## 2012-12-05 MED ORDER — KETOROLAC TROMETHAMINE 30 MG/ML IJ SOLN
30.0000 mg | Freq: Once | INTRAMUSCULAR | Status: AC
  Administered 2012-12-05: 30 mg via INTRAVENOUS
  Filled 2012-12-05: qty 1

## 2012-12-05 MED ORDER — SODIUM CHLORIDE 0.9 % IV BOLUS (SEPSIS)
1000.0000 mL | Freq: Once | INTRAVENOUS | Status: AC
  Administered 2012-12-05: 1000 mL via INTRAVENOUS

## 2012-12-05 NOTE — ED Notes (Signed)
Pt reports having headache for 12 days & blood pressure running high. Was told by nurse to come to the ER.

## 2012-12-05 NOTE — ED Notes (Signed)
Pt c/o ongoing headache for over a week with an increasing BP.  Pt states today she began to feel dizzy with nausea and pain radiating from her head to her left shoulder.

## 2012-12-05 NOTE — ED Provider Notes (Signed)
History    This chart was scribed for [name], MD by [name], ED Scribe. This patient was seen in room APA02/APA02 and the patient's care was started 9:04 PM.   CSN: [id number]  Arrival date & time 12/05/12  2026   First MD Initiated Contact with Patient 12/05/12 2059      Chief Complaint  Patient presents with  . Headache  . Hypertension     The history is provided by the patient. No language interpreter was used.    Courtney Hale is a 47 y.o. female who presents to the Emergency Department complaining of ongoing, constant, unchanged HA that moves around and radiates down her left neck starting 12 days ago.  Her headache is currently in her temples the past 2 days. She rated her HA as 10/10 this morning but currently is a 6/10 after taking ibuprofen with relief. She states that while working, pt states all of a sudden she felt very weak and lost her energy 3 days ago. She checked her BP and it was 159/46. 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 She was seen by a neurologist and told to "tough it out". States she usually gets it with her period, but this time it has persisted.  Usually she doesn't have nausea with her HA unlike this time. States she was woken up last night by her hands being stiff. She has associated feelings of being off balance, nausea. She denies taking any different medications or using any new detergents. She states she feels like she is "burning up". She denies vomiting, numbness or tingling in upper extremities.    PCP Dr. Gerda Diss.    Past Medical History  Diagnosis Date  . Melanoma   . Hypertension   . Depression     Past Surgical History  Procedure Laterality Date  . Melanoma excision    . Tonsillectomy    . Skin biopsy    . Cyst excision      No family history on file.  History  Substance Use Topics  . Smoking status: Never Smoker   . Smokeless tobacco: Not on file  . Alcohol Use: No   employed  No OB history provided.   Review of Systems A complete 10 system review of systems was obtained and all systems are negative except as noted in the HPI and PMH.   Allergies  Sulfa antibiotics  Home Medications   Current Outpatient Rx  Name  Route  Sig  Dispense  Refill  . citalopram (CELEXA) 20 MG tablet   Oral   Take 20 mg by mouth every morning.         Marland Kitchen ibuprofen (ADVIL,MOTRIN) 200 MG tablet   Oral   Take 800 mg by mouth as needed for pain or headache.         . lisinopril (PRINIVIL,ZESTRIL) 20 MG tablet   Oral   Take 20 mg by mouth every morning.          . Norgestimate-Ethinyl Estradiol Triphasic (TRI-SPRINTEC) 0.18/0.215/0.25 MG-35 MCG tablet   Oral   Take 1 tablet by mouth every morning.           BP 171/84  Pulse 81  Temp(Src) 98 F (36.7 C) (Oral)  Resp 17  Ht 5\' 3"  (1.6 m)  Wt 170 lb (77.111 kg)  BMI 30.12 kg/m2  SpO2 97%  LMP 12/03/2012  Physical Exam  Nursing note and vitals reviewed.  Constitutional: She is oriented to person, place, and time. She appears well-developed and well-nourished.  Non-toxic appearance. She does not appear ill. No distress.  HENT:  Head: Normocephalic and atraumatic.  Right Ear: External ear normal.  Left Ear: External ear normal.  Nose: Nose normal. No mucosal edema or rhinorrhea.  Mouth/Throat: Oropharynx is clear and moist and mucous membranes are normal. No dental abscesses or edematous.  Eyes: Conjunctivae and EOM are normal. Pupils are equal, round, and reactive to light.  Neck: Normal range of motion and full passive range of motion without pain. Neck supple.  Cardiovascular: Normal rate, regular rhythm and normal heart sounds.  Exam reveals no gallop and no friction rub.   No murmur heard. Pulmonary/Chest: Effort normal and breath sounds normal. No respiratory distress. She has no wheezes. She has no rhonchi. She has no rales. She exhibits no tenderness and no crepitus.  Abdominal: Soft. Normal  appearance and bowel sounds are normal. She exhibits no distension. There is no tenderness. There is no rebound and no guarding.  Musculoskeletal: Normal range of motion. She exhibits no edema and no tenderness.  Moves all extremities well. Tenderness to left trapezius.   Neurological: She is alert and oriented to person, place, and time. She has normal strength. No cranial nerve deficit.  Skin: Skin is warm, dry and intact. No pallor.  Has redness and flushing of cheeks, upper chest and upper back.   Psychiatric: She has a normal mood and affect. Her speech is normal and behavior is normal. Her mood appears not anxious.    ED Course  Procedures (including critical care time)  Medications  sodium chloride 0.9 % bolus 1,000 mL (1,000 mLs Intravenous New Bag/Given 12/05/12 2141)  metoCLOPramide (REGLAN) injection 10 mg (10 mg Intravenous Given 12/05/12 2141)  diphenhydrAMINE (BENADRYL) injection 25 mg (25 mg Intravenous Given 12/05/12 2141)  ketorolac (TORADOL) 30 MG/ML injection 30 mg (30 mg Intravenous Given 12/05/12 2141)    DIAGNOSTIC STUDIES: Oxygen Saturation is 100% on room air, normal by my interpretation.    COORDINATION OF CARE: 9:12 PM Discussed treatment plan which includes CBC panel, comprehensive metabolic panel with pt at bedside and pt agreed to plan.    10:32PM Upon recheck, pt states the HA is  gone. Advised of lab results. Feels ready to go home.   Last BP is 133/84   Results for orders placed during the hospital encounter of 12/05/12  CBC WITH DIFFERENTIAL      Result Value Range   WBC 6.5  4.0 - 10.5 K/uL   RBC 4.16  3.87 - 5.11 MIL/uL   Hemoglobin 12.5  12.0 - 15.0 g/dL   HCT 45.4  09.8 - 11.9 %   MCV 86.5  78.0 - 100.0 fL   MCH 30.0  26.0 - 34.0 pg   MCHC 34.7  30.0 - 36.0 g/dL   RDW 14.7  82.9 - 56.2 %   Platelets 284  150 - 400 K/uL   Neutrophils Relative 57  43 - 77 %   Neutro Abs 3.7  1.7 - 7.7 K/uL   Lymphocytes Relative 35  12 - 46 %   Lymphs Abs 2.3   0.7 - 4.0 K/uL   Monocytes Relative 7  3 - 12 %   Monocytes Absolute 0.4  0.1 - 1.0 K/uL   Eosinophils Relative 1  0 - 5 %   Eosinophils Absolute 0.1  0.0 - 0.7 K/uL   Basophils Relative 0  0 - 1 %  Basophils Absolute 0.0  0.0 - 0.1 K/uL  COMPREHENSIVE METABOLIC PANEL      Result Value Range   Sodium 136  135 - 145 mEq/L   Potassium 3.3 (*) 3.5 - 5.1 mEq/L   Chloride 101  96 - 112 mEq/L   CO2 24  19 - 32 mEq/L   Glucose, Bld 100 (*) 70 - 99 mg/dL   BUN 6  6 - 23 mg/dL   Creatinine, Ser 9.14  0.50 - 1.10 mg/dL   Calcium 8.9  8.4 - 78.2 mg/dL   Total Protein 7.0  6.0 - 8.3 g/dL   Albumin 3.7  3.5 - 5.2 g/dL   AST 14  0 - 37 U/L   ALT 14  0 - 35 U/L   Alkaline Phosphatase 65  39 - 117 U/L   Total Bilirubin 0.3  0.3 - 1.2 mg/dL   GFR calc non Af Amer >90  >90 mL/min   GFR calc Af Amer >90  >90 mL/min      Laboratory interpretation all normal except mild hypokalemia    1. Headache   2. Hypertension     Plan discharge  Devoria Albe, MD, FACEP     MDM   I personally performed the services described in this documentation, which was scribed in my presence. The recorded information has been reviewed and considered.  Devoria Albe, MD, Armando Gang   [name], MD 12/05/12 [phone]

## 2013-01-27 ENCOUNTER — Encounter: Payer: Self-pay | Admitting: *Deleted

## 2013-01-31 ENCOUNTER — Ambulatory Visit (INDEPENDENT_AMBULATORY_CARE_PROVIDER_SITE_OTHER): Payer: BC Managed Care – PPO | Admitting: Nurse Practitioner

## 2013-01-31 ENCOUNTER — Encounter: Payer: Self-pay | Admitting: Nurse Practitioner

## 2013-01-31 VITALS — BP 140/92 | HR 90 | Ht 63.5 in | Wt 166.2 lb

## 2013-01-31 DIAGNOSIS — I10 Essential (primary) hypertension: Secondary | ICD-10-CM | POA: Insufficient documentation

## 2013-01-31 DIAGNOSIS — G43709 Chronic migraine without aura, not intractable, without status migrainosus: Secondary | ICD-10-CM

## 2013-01-31 MED ORDER — PHENTERMINE HCL 37.5 MG PO TABS: 37.5000 mg | ORAL_TABLET | Freq: Every day | ORAL | Status: DC

## 2013-01-31 MED ORDER — RIZATRIPTAN BENZOATE 10 MG PO TBDP: 10.0000 mg | ORAL_TABLET | ORAL | Status: DC | PRN

## 2013-01-31 MED ORDER — NAPROXEN 500 MG PO TABS: 500.0000 mg | ORAL_TABLET | Freq: Two times a day (BID) | ORAL | Status: DC

## 2013-01-31 MED ORDER — TOPIRAMATE 25 MG PO TABS: ORAL_TABLET | ORAL | Status: DC

## 2013-01-31 NOTE — Patient Instructions (Signed)
Migraine Headache A migraine headache is an intense, throbbing pain on one or both sides of your head. A migraine can last for 30 minutes to several hours. CAUSES  The exact cause of a migraine headache is not always known. However, a migraine may be caused when nerves in the brain become irritated and release chemicals that cause inflammation. This causes pain. SYMPTOMS  Pain on one or both sides of your head.  Pulsating or throbbing pain.  Severe pain that prevents daily activities.  Pain that is aggravated by any physical activity.  Nausea, vomiting, or both.  Dizziness.  Pain with exposure to bright lights, loud noises, or activity.  General sensitivity to bright lights, loud noises, or smells. Before you get a migraine, you may get warning signs that a migraine is coming (aura). An aura may include:  Seeing flashing lights.  Seeing bright spots, halos, or zig-zag lines.  Having tunnel vision or blurred vision.  Having feelings of numbness or tingling.  Having trouble talking.  Having muscle weakness. MIGRAINE TRIGGERS  Alcohol.  Smoking.  Stress.  Menstruation.  Aged cheeses.  Foods or drinks that contain nitrates, glutamate, aspartame, or tyramine.  Lack of sleep.  Chocolate.  Caffeine.  Hunger.  Physical exertion.  Fatigue.  Medicines used to treat chest pain (nitroglycerine), birth control pills, estrogen, and some blood pressure medicines. DIAGNOSIS  A migraine headache is often diagnosed based on:  Symptoms.  Physical examination.  A CT scan or MRI of your head. TREATMENT Medicines may be given for pain and nausea. Medicines can also be given to help prevent recurrent migraines.  HOME CARE INSTRUCTIONS  Only take over-the-counter or prescription medicines for pain or discomfort as directed by your caregiver. The use of long-term narcotics is not recommended.  Lie down in a dark, quiet room when you have a migraine.  Keep a journal  to find out what may trigger your migraine headaches. For example, write down:  What you eat and drink.  How much sleep you get.  Any change to your diet or medicines.  Limit alcohol consumption.  Quit smoking if you smoke.  Get 7 to 9 hours of sleep, or as recommended by your caregiver.  Limit stress.  Keep lights dim if bright lights bother you and make your migraines worse. SEEK IMMEDIATE MEDICAL CARE IF:   Your migraine becomes severe.  You have a fever.  You have a stiff neck.  You have vision loss.  You have muscular weakness or loss of muscle control.  You start losing your balance or have trouble walking.  You feel faint or pass out.  You have severe symptoms that are different from your first symptoms. MAKE SURE YOU:   Understand these instructions.  Will watch your condition.  Will get help right away if you are not doing well or get worse. Document Released: 10/11/2005 Document Revised: 01/03/2012 Document Reviewed: 10/01/2011 Exit[employer] Patient Information 2013 [employer].  

## 2013-01-31 NOTE — Assessment & Plan Note (Signed)
Continue verapamil and lisinopril as directed.

## 2013-01-31 NOTE — Progress Notes (Signed)
Subjective:  Presents for recheck. Has a history of migraine headache without aura for over 12 years. Averaging 1-2 headaches per month. Has had a complete workup in the past including MRI. Symptoms including throbbing pounding pain with photosensitivity photophobia and nausea. Rare vomiting. No visual changes. No numbness or weakness of the face arms or legs. No relief with verapamil see previous note. Currently on birth control pills. Denies any missed pills. No sexual activity for the past 3 years. Has migraine the week before her cycle. Has not identified any other specific triggers for other migraines. Also would like to continue phentermine for a while longer. Very active job. Also doing walking with her family. Doing well with her diet.  Objective:   BP 140/92  Pulse 90  Ht 5' 3.5" (1.613 m)  Wt 166 lb 4 oz (75.411 kg)  BMI 28.98 kg/m2 NAD. Alert, oriented. Lungs clear. Heart regular rate rhythm.

## 2013-01-31 NOTE — Assessment & Plan Note (Signed)
Continue verapamil for blood pressure. Add Topamax with titrated dose. Maxalt and naproxen as directed for migraine. Patient to keep headache diary and try to do 5 any other specific triggers besides her menses. Lengthy discussion regarding risk of birth defects associated with Topamax. If patient becomes sexually active, recommend other form of birth control. Recheck in 3 weeks, call back sooner if any problems.

## 2013-02-21 ENCOUNTER — Encounter: Payer: Self-pay | Admitting: Nurse Practitioner

## 2013-02-21 ENCOUNTER — Ambulatory Visit (INDEPENDENT_AMBULATORY_CARE_PROVIDER_SITE_OTHER): Payer: BC Managed Care – PPO | Admitting: Nurse Practitioner

## 2013-02-21 VITALS — BP 118/82 | Wt 162.4 lb

## 2013-02-21 DIAGNOSIS — G43709 Chronic migraine without aura, not intractable, without status migrainosus: Secondary | ICD-10-CM

## 2013-02-21 DIAGNOSIS — G43829 Menstrual migraine, not intractable, without status migrainosus: Secondary | ICD-10-CM

## 2013-02-21 MED ORDER — ESTRADIOL 0.025 MG/24HR TD PTWK: 1.0000 | MEDICATED_PATCH | TRANSDERMAL | Status: DC

## 2013-02-21 MED ORDER — TOPIRAMATE 50 MG PO TABS: 50.0000 mg | ORAL_TABLET | Freq: Two times a day (BID) | ORAL | Status: DC

## 2013-02-21 MED ORDER — RIZATRIPTAN BENZOATE 10 MG PO TBDP: 10.0000 mg | ORAL_TABLET | ORAL | Status: DC | PRN

## 2013-02-22 ENCOUNTER — Encounter: Payer: Self-pay | Admitting: Nurse Practitioner

## 2013-02-22 DIAGNOSIS — G43829 Menstrual migraine, not intractable, without status migrainosus: Secondary | ICD-10-CM | POA: Insufficient documentation

## 2013-02-22 NOTE — Assessment & Plan Note (Signed)
Continue tri-Sprintec as directed. Add low-dose estrogen patch at the end of the pack as directed.

## 2013-02-22 NOTE — Progress Notes (Signed)
Subjective:  Presents for recheck on her migraine headaches. Overall doing much better. Other than around the time of her menstrual cycle, has not had any migraine headaches. These headaches were improved with Maxalt. Has used about 4 doses total. Currently on tri-Sprintec for birth control. Cycles are regular. Her current menstrual cycle started yesterday, began having headaches 3 days ago. They usually start the day after she takes her last active pill. Has had off-and-on migraines since she took her last pill on 4/26. Also taking Topamax.  Objective:   BP 118/82  Wt 162 lb 6.4 oz (73.664 kg)  BMI 28.31 kg/m2 NAD. Alert, oriented. Lungs clear. Heart regular rate rhythm.  Assessment:Headache, chronic migraine without aura  Menstrual migraine  Plan: Meds ordered this encounter  Medications  . rizatriptan (MAXALT-MLT) 10 MG disintegrating tablet    Sig: Take 1 tablet (10 mg total) by mouth as needed for migraine. May repeat in 2 hours if needed. Max 2 per 24 hours    Dispense:  10 tablet    Refill:  11    Order Specific Question:  Supervising Provider    Answer:  Merlyn Albert [[id number]]  . topiramate (TOPAMAX) 50 MG tablet    Sig: Take 1 tablet (50 mg total) by mouth 2 (two) times daily.    Dispense:  60 tablet    Refill:  5    Order Specific Question:  Supervising Provider    Answer:  Merlyn Albert [[id number]]  . estradiol (CLIMARA - DOSED IN MG/24 HR) 0.025 mg/24hr    Sig: Place 1 patch (0.025 mg total) onto the skin once a week. During menses    Dispense:  4 patch    Refill:  12    Order Specific Question:  Supervising Provider    Answer:  Merlyn Albert [[id number]]   continue Maxalt as directed when necessary. Increase Topamax to 50 mg 1 by mouth twice a day. Again reminded about risk of birth defects with pregnancy. Discussed options regarding menstrual migraines. Patient to apply a low-dose estrogen patch on Saturday when she finishes her last active pill with her next pack. To  call back and let us know if this helps. Other options include increasing dose of estrogen patch or continuous birth control pills.

## 2013-02-22 NOTE — Assessment & Plan Note (Signed)
Improving with Topamax, dosage increased to 50 mg twice a day. Continues to have menstrual migraines.

## 2013-04-26 ENCOUNTER — Telehealth: Payer: Self-pay | Admitting: Family Medicine

## 2013-04-26 NOTE — Telephone Encounter (Signed)
Outgoing call to patient, received voicemail. Left message for the patient to return a call to the office ([facility]).  Her FMLA paperwork is ready for pick-up.  The patient will need to pay the office fee/sign the office form/RFM will be more than glad to mail out her paper work to the [employer] disability/leave service center, but we will not fax in the information.

## 2013-06-18 ENCOUNTER — Ambulatory Visit (INDEPENDENT_AMBULATORY_CARE_PROVIDER_SITE_OTHER): Payer: BC Managed Care – PPO | Admitting: Family Medicine

## 2013-06-18 ENCOUNTER — Encounter: Payer: Self-pay | Admitting: Family Medicine

## 2013-06-18 VITALS — BP 110/78 | Ht 63.5 in | Wt 157.8 lb

## 2013-06-18 DIAGNOSIS — S60559A Superficial foreign body of unspecified hand, initial encounter: Secondary | ICD-10-CM

## 2013-06-18 DIAGNOSIS — S60551A Superficial foreign body of right hand, initial encounter: Secondary | ICD-10-CM

## 2013-06-18 NOTE — Progress Notes (Signed)
  Subjective:    Patient ID: Courtney Hale, female    DOB: [birth year], 47 y.o.   MRN: [id number]  HPI Patient arrives with a splinter in right ring finger since last night. Hit hand on a wood shelf and splinther went under the finger nail.   Review of Systems Last tetanus shot a couple years ago ROS otherwise negative    Objective:   Physical Exam Procedure note patient was prepped draped anesthetized. Fine forceps were inserted underneath the fingernail. Very deep splinter was removed dressing applied wound care discussed       Assessment & Plan:  Impression foreign body removal plan warning signs discussed local measures discussed. WSL

## 2013-07-31 ENCOUNTER — Other Ambulatory Visit: Payer: Self-pay | Admitting: Family Medicine

## 2013-08-28 ENCOUNTER — Other Ambulatory Visit: Payer: Self-pay | Admitting: *Deleted

## 2013-08-28 ENCOUNTER — Telehealth: Payer: Self-pay | Admitting: Family Medicine

## 2013-08-28 MED ORDER — TOPIRAMATE 50 MG PO TABS: 50.0000 mg | ORAL_TABLET | Freq: Two times a day (BID) | ORAL | Status: DC

## 2013-08-28 NOTE — Telephone Encounter (Signed)
Med sent to pharm. Pt notified on voicemail.  

## 2013-08-28 NOTE — Telephone Encounter (Signed)
°  topiramate (TOPAMAX) 50 MG tablet     Pt states [facility] has sent this over several times we have not replied, i don't see any indication  That we have gotten a request, sending this back now. Call pt when done

## 2013-08-29 ENCOUNTER — Other Ambulatory Visit: Payer: Self-pay | Admitting: *Deleted

## 2013-08-29 ENCOUNTER — Telehealth: Payer: Self-pay | Admitting: *Deleted

## 2013-08-29 ENCOUNTER — Encounter: Payer: Self-pay | Admitting: Orthopedic Surgery

## 2013-08-29 ENCOUNTER — Ambulatory Visit (INDEPENDENT_AMBULATORY_CARE_PROVIDER_SITE_OTHER): Payer: BC Managed Care – PPO | Admitting: Orthopedic Surgery

## 2013-08-29 ENCOUNTER — Ambulatory Visit (INDEPENDENT_AMBULATORY_CARE_PROVIDER_SITE_OTHER): Payer: BC Managed Care – PPO

## 2013-08-29 VITALS — BP 126/82 | Ht 63.5 in | Wt 158.0 lb

## 2013-08-29 DIAGNOSIS — S6991XA Unspecified injury of right wrist, hand and finger(s), initial encounter: Secondary | ICD-10-CM

## 2013-08-29 DIAGNOSIS — M79641 Pain in right hand: Secondary | ICD-10-CM

## 2013-08-29 DIAGNOSIS — S6980XA Other specified injuries of unspecified wrist, hand and finger(s), initial encounter: Secondary | ICD-10-CM

## 2013-08-29 DIAGNOSIS — M79609 Pain in unspecified limb: Secondary | ICD-10-CM

## 2013-08-29 DIAGNOSIS — S6990XA Unspecified injury of unspecified wrist, hand and finger(s), initial encounter: Secondary | ICD-10-CM | POA: Insufficient documentation

## 2013-08-29 NOTE — Progress Notes (Signed)
Patient ID: Courtney Hale, female   DOB: Jul 02, 1966, 47 y.o.   MRN: [id number]   Chief Complaint  Patient presents with  . Hand Pain    Right thumb and hand pain,Numbness.  Had injury back in May 2014    HISTORY: This is a 47 year old female who is right-hand-dominant who works in [employer]. She was in the freezer at work in May and when she came out something pop at the base of her right thumb and since that time she had increasing pain decreasing motion and has noticed a defect in the bulk of the thumb musculature on the volar aspect of the hand. She complains of sharp stabbing 8/10 constant pain and she started to notice some numbness and tingling in the thumb and index finger with pain radiating distal to proximal not proximal to distal denies neck pain  History blurred vision eye pain nausea depression and seasonal allergies her remaining review of systems reported as negative  Allergies to sulfa  She had a right index finger trigger finger/cyst excision  Current medications are lisinopril, Celexa, verapamil, Maxalt, Naprosyn, Topamax,  Family history of heart disease, arthritis, cancer, diabetes  Social history divorced. Department manager the bakery at [employer] She does not smoke Alcohol use is light  BP 126/82  Ht 5' 3.5" (1.613 m)  Wt 158 lb (71.668 kg)  BMI 27.55 kg/m2 General appearance is normal, the patient is alert and oriented x3 with normal mood and affect. There is actual muscle bulk defect in the volar aspect of the thumb. Bilaterally however she has decreased metacarpophalangeal joint flexion. There is tenderness at the defect. The IP joint flexor is intact. Skin is normal. Pulses normal. Sensation normal to soft touch. She has no epitrochlear lymph nodes. There is no deformity. She does have some mild pain with palpation over the carpal metacarpal joint.  The x-rays do not show any major abnormality  Impression I think she is ruptured one of the O.-A.-F.  muscles of the thumb.  Recommend hand surgical followup  Splint until seen by hand surgery.

## 2013-08-29 NOTE — Telephone Encounter (Signed)
Faxed referral and office notes to [facility]. Awaiting appointment. 

## 2013-08-29 NOTE — Patient Instructions (Signed)
Refer to hand specialist for thumb flexor muscle tear. Dr Amanda Pea

## 2013-09-10 ENCOUNTER — Other Ambulatory Visit: Payer: Self-pay | Admitting: Family Medicine

## 2013-09-12 NOTE — Telephone Encounter (Signed)
Patient has an appointment with Dr. Amanda Pea 09/15/13 at 10:45 am.

## 2013-10-15 ENCOUNTER — Other Ambulatory Visit: Payer: Self-pay | Admitting: Family Medicine

## 2013-10-29 HISTORY — PX: CARPAL TUNNEL RELEASE: SHX101

## 2014-03-18 ENCOUNTER — Other Ambulatory Visit: Payer: Self-pay | Admitting: Family Medicine

## 2014-03-19 NOTE — Telephone Encounter (Signed)
Last seen for headaches 01/2013

## 2014-08-09 ENCOUNTER — Ambulatory Visit (INDEPENDENT_AMBULATORY_CARE_PROVIDER_SITE_OTHER): Payer: BC Managed Care – PPO | Admitting: Nurse Practitioner

## 2014-08-09 ENCOUNTER — Encounter: Payer: Self-pay | Admitting: Nurse Practitioner

## 2014-08-09 VITALS — BP 112/80 | Ht 63.5 in | Wt 169.0 lb

## 2014-08-09 DIAGNOSIS — I10 Essential (primary) hypertension: Secondary | ICD-10-CM

## 2014-08-09 DIAGNOSIS — E669 Obesity, unspecified: Secondary | ICD-10-CM

## 2014-08-09 DIAGNOSIS — G43709 Chronic migraine without aura, not intractable, without status migrainosus: Secondary | ICD-10-CM

## 2014-08-09 DIAGNOSIS — N943 Premenstrual tension syndrome: Secondary | ICD-10-CM

## 2014-08-09 DIAGNOSIS — G43829 Menstrual migraine, not intractable, without status migrainosus: Secondary | ICD-10-CM

## 2014-08-09 MED ORDER — NAPROXEN 500 MG PO TABS: 500.0000 mg | ORAL_TABLET | Freq: Two times a day (BID) | ORAL | Status: DC

## 2014-08-09 MED ORDER — NORGESTIMATE-ETH ESTRADIOL 0.25-35 MG-MCG PO TABS: ORAL_TABLET | ORAL | Status: DC

## 2014-08-09 MED ORDER — VERAPAMIL HCL ER 180 MG PO TBCR: EXTENDED_RELEASE_TABLET | ORAL | Status: DC

## 2014-08-09 MED ORDER — PHENTERMINE HCL 37.5 MG PO TABS: 37.5000 mg | ORAL_TABLET | Freq: Every day | ORAL | Status: DC

## 2014-08-09 MED ORDER — RIZATRIPTAN BENZOATE 10 MG PO TBDP
10.0000 mg | ORAL_TABLET | ORAL | Status: DC | PRN
Start: 2014-08-09 — End: 2015-05-23

## 2014-08-09 MED ORDER — TOPIRAMATE 50 MG PO TABS: ORAL_TABLET | ORAL | Status: DC

## 2014-08-09 NOTE — Patient Instructions (Signed)
nexplanon mirena Endometrial ablation [email or web address] Continuous birth control pills

## 2014-08-12 ENCOUNTER — Encounter: Payer: Self-pay | Admitting: Nurse Practitioner

## 2014-08-12 NOTE — Progress Notes (Signed)
Subjective:  Presents for routine followup. Sees gynecology for her preventive health physicals. Regular mammograms. Nonsmoker. Currently on tri-Sprintec. Having heavy menstrual cycles as well as menstrual migraines at start right before her menses. That is the main time that she is having migraines at this point. occurs a few days before her cycle begins. Some relief with Maxalt. Also on Topamax and verapamil. No chest pain/ischemic type pain or unusual shortness of breath. No edema. Same sexual partner. Staying active. Doing well with her diet. Would like to continue phentermine for now. Denies any adverse affects.  Objective:   BP 112/80  Ht 5' 3.5" (1.613 m)  Wt 169 lb (76.658 kg)  BMI 29.46 kg/m2 NAD. Alert, oriented. Lungs clear. Heart regular rate rhythm. Lower extremities no edema.  Assessment:  Problem List Items Addressed This Visit     Cardiovascular and Mediastinum   Headache, chronic migraine without aura - Primary   Relevant Medications      verapamil (CALAN-SR) CR tablet      topiramate (TOPAMAX) tablet      rizatriptan (MAXALT MLT) disintegrating tablet      naproxen (NAPROSYN) tablet   Hypertension   Relevant Medications      verapamil (CALAN-SR) CR tablet   Menstrual migraine   Relevant Medications      verapamil (CALAN-SR) CR tablet      topiramate (TOPAMAX) tablet      rizatriptan (MAXALT MLT) disintegrating tablet      naproxen (NAPROSYN) tablet    Other Visit Diagnoses   Obesity        Relevant Medications       phentermine (ADIPEX-P) 37.5 MG tablet         Plan:  Meds ordered this encounter  Medications  . verapamil (CALAN-SR) 180 MG CR tablet    Sig: TAKE ONE TABLET BY MOUTH ONCE DAILY    Dispense:  30 tablet    Refill:  5    Order Specific Question:  Supervising Provider    Answer:  [name] [[id number]]  . topiramate (TOPAMAX) 50 MG tablet    Sig: TAKE ONE TABLET BY MOUTH TWICE DAILY    Dispense:  60 tablet    Refill:  5    Order Specific  Question:  Supervising Provider    Answer:  [name] [[id number]]  . rizatriptan (MAXALT-MLT) 10 MG disintegrating tablet    Sig: Take 1 tablet (10 mg total) by mouth as needed for migraine. May repeat in 2 hours if needed. Max 2 per 24 hours    Dispense:  10 tablet    Refill:  11    Order Specific Question:  Supervising Provider    Answer:  [name] [[id number]]  . naproxen (NAPROSYN) 500 MG tablet    Sig: Take 1 tablet (500 mg total) by mouth 2 (two) times daily with a meal. Prn pain or migraine    Dispense:  30 tablet    Refill:  2    Order Specific Question:  Supervising Provider    Answer:  [name] [[id number]]  . phentermine (ADIPEX-P) 37.5 MG tablet    Sig: Take 1 tablet (37.5 mg total) by mouth daily before breakfast.    Dispense:  30 tablet    Refill:  2    Order Specific Question:  Supervising Provider    Answer:  [name] [[id number]]  . norgestimate-ethinyl estradiol (ORTHO-CYCLEN,SPRINTEC,PREVIFEM) 0.25-35 MG-MCG tablet    Sig: One po qd continuous  Dispense:  1 Package    Refill:  11    Needs to take continuous pills due to menorrhagia and migraines; please send PA if needed    Order Specific Question:  Supervising Provider    Answer:  [name] [[id number]]  . Epinastine HCl 0.05 % ophthalmic solution    Sig:    Switch to continuous Sprintec with her next pack of pills to keep a steady dose of hormones and to hopefully stop her cycle. Patient to let her gynecologist note at next visit. Call back if bleeding or headaches persist. Understands risks associated with hormone use. Discussed other options, encouraged patient to research these for her next visit with her gynecologist. Take Maxalt and naproxen at onset of migraine. Return in about 3 months (around 11/09/2014). Call back sooner if any problems.

## 2014-08-19 ENCOUNTER — Telehealth: Payer: Self-pay | Admitting: *Deleted

## 2014-08-19 ENCOUNTER — Emergency Department (HOSPITAL_COMMUNITY)
Admission: EM | Admit: 2014-08-19 | Discharge: 2014-08-19 | Disposition: A | Discharge status: Home / Self Care | Payer: BC Managed Care – PPO | Attending: Emergency Medicine | Admitting: Emergency Medicine

## 2014-08-19 ENCOUNTER — Emergency Department (HOSPITAL_COMMUNITY): Payer: BC Managed Care – PPO

## 2014-08-19 ENCOUNTER — Encounter (HOSPITAL_COMMUNITY): Payer: Self-pay | Admitting: Emergency Medicine

## 2014-08-19 DIAGNOSIS — K529 Noninfective gastroenteritis and colitis, unspecified: Secondary | ICD-10-CM

## 2014-08-19 DIAGNOSIS — F329 Major depressive disorder, single episode, unspecified: Secondary | ICD-10-CM | POA: Diagnosis not present

## 2014-08-19 DIAGNOSIS — Z79899 Other long term (current) drug therapy: Secondary | ICD-10-CM | POA: Insufficient documentation

## 2014-08-19 DIAGNOSIS — I10 Essential (primary) hypertension: Secondary | ICD-10-CM | POA: Diagnosis not present

## 2014-08-19 DIAGNOSIS — K5289 Other specified noninfective gastroenteritis and colitis: Secondary | ICD-10-CM | POA: Insufficient documentation

## 2014-08-19 DIAGNOSIS — Z85820 Personal history of malignant melanoma of skin: Secondary | ICD-10-CM | POA: Diagnosis not present

## 2014-08-19 DIAGNOSIS — Z3202 Encounter for pregnancy test, result negative: Secondary | ICD-10-CM | POA: Insufficient documentation

## 2014-08-19 DIAGNOSIS — R1084 Generalized abdominal pain: Secondary | ICD-10-CM | POA: Diagnosis present

## 2014-08-19 DIAGNOSIS — Z880 Allergy status to penicillin: Secondary | ICD-10-CM | POA: Insufficient documentation

## 2014-08-19 LAB — URINALYSIS, ROUTINE W REFLEX MICROSCOPIC
Bilirubin Urine: NEGATIVE
GLUCOSE, UA: NEGATIVE mg/dL
KETONES UR: NEGATIVE mg/dL
NITRITE: NEGATIVE
PH: 5 (ref 5.0–8.0)
Urobilinogen, UA: 0.2 mg/dL (ref 0.0–1.0)

## 2014-08-19 LAB — COMPREHENSIVE METABOLIC PANEL
ALBUMIN: 3.9 g/dL (ref 3.5–5.2)
ALK PHOS: 62 U/L (ref 39–117)
ALT: 12 U/L (ref 0–35)
ANION GAP: 15 (ref 5–15)
AST: 14 U/L (ref 0–37)
BILIRUBIN TOTAL: 0.3 mg/dL (ref 0.3–1.2)
BUN: 17 mg/dL (ref 6–23)
CHLORIDE: 104 meq/L (ref 96–112)
CO2: 19 mEq/L (ref 19–32)
CREATININE: 0.86 mg/dL (ref 0.50–1.10)
Calcium: 9.2 mg/dL (ref 8.4–10.5)
GFR calc Af Amer: >90 mL/min (ref >90)
GFR calc non Af Amer: 80 mL/min — ABNORMAL LOW (ref >90)
Glucose, Bld: 108 mg/dL — ABNORMAL HIGH (ref 70–99)
POTASSIUM: 3.4 meq/L — AB (ref 3.7–5.3)
Sodium: 138 mEq/L (ref 137–147)
TOTAL PROTEIN: 7.8 g/dL (ref 6.0–8.3)

## 2014-08-19 LAB — CBC WITH DIFFERENTIAL/PLATELET
BASOS ABS: 0 10*3/uL (ref 0.0–0.1)
BASOS PCT: 0 % (ref 0–1)
EOS ABS: 0.1 10*3/uL (ref 0.0–0.7)
Eosinophils Relative: 1 % (ref 0–5)
HCT: 39.5 % (ref 36.0–46.0)
Hemoglobin: 13.6 g/dL (ref 12.0–15.0)
Lymphocytes Relative: 28 % (ref 12–46)
Lymphs Abs: 3 10*3/uL (ref 0.7–4.0)
MCH: 29.8 pg (ref 26.0–34.0)
MCHC: 34.4 g/dL (ref 30.0–36.0)
MCV: 86.6 fL (ref 78.0–100.0)
Monocytes Absolute: 0.7 10*3/uL (ref 0.1–1.0)
Monocytes Relative: 7 % (ref 3–12)
NEUTROS ABS: 6.9 10*3/uL (ref 1.7–7.7)
NEUTROS PCT: 64 % (ref 43–77)
Platelets: 377 10*3/uL (ref 150–400)
RBC: 4.56 MIL/uL (ref 3.87–5.11)
RDW: 13.7 % (ref 11.5–15.5)
WBC: 10.7 10*3/uL — ABNORMAL HIGH (ref 4.0–10.5)

## 2014-08-19 LAB — URINE MICROSCOPIC-ADD ON

## 2014-08-19 LAB — PREGNANCY, URINE: Preg Test, Ur: NEGATIVE

## 2014-08-19 LAB — LIPASE, BLOOD: Lipase: 30 U/L (ref 11–59)

## 2014-08-19 MED ORDER — ONDANSETRON 4 MG PO TBDP: 4.0000 mg | ORAL_TABLET | Freq: Three times a day (TID) | ORAL | Status: DC | PRN

## 2014-08-19 MED ORDER — CIPROFLOXACIN HCL 500 MG PO TABS: 500.0000 mg | ORAL_TABLET | Freq: Two times a day (BID) | ORAL | Status: DC

## 2014-08-19 MED ORDER — ONDANSETRON HCL 4 MG/2ML IJ SOLN
4.0000 mg | Freq: Once | INTRAMUSCULAR | Status: AC
  Administered 2014-08-19: 4 mg via INTRAVENOUS
  Filled 2014-08-19: qty 2

## 2014-08-19 MED ORDER — OXYCODONE-ACETAMINOPHEN 5-325 MG PO TABS: 1.0000 | ORAL_TABLET | ORAL | Status: DC | PRN

## 2014-08-19 MED ORDER — METRONIDAZOLE 500 MG PO TABS: 500.0000 mg | ORAL_TABLET | Freq: Two times a day (BID) | ORAL | Status: DC

## 2014-08-19 MED ORDER — CIPROFLOXACIN HCL 250 MG PO TABS
500.0000 mg | ORAL_TABLET | Freq: Once | ORAL | Status: AC
  Administered 2014-08-19: 500 mg via ORAL
  Filled 2014-08-19: qty 2

## 2014-08-19 MED ORDER — HYDROMORPHONE HCL 1 MG/ML IJ SOLN
1.0000 mg | Freq: Once | INTRAMUSCULAR | Status: AC
Start: 2014-08-19 — End: 2014-08-19
  Administered 2014-08-19: 1 mg via INTRAVENOUS
  Filled 2014-08-19: qty 1

## 2014-08-19 MED ORDER — SODIUM CHLORIDE 0.9 % IV BOLUS (SEPSIS)
1000.0000 mL | Freq: Once | INTRAVENOUS | Status: AC
  Administered 2014-08-19: 1000 mL via INTRAVENOUS

## 2014-08-19 MED ORDER — IOHEXOL 300 MG/ML  SOLN
100.0000 mL | Freq: Once | INTRAMUSCULAR | Status: AC | PRN
  Administered 2014-08-19: 100 mL via INTRAVENOUS

## 2014-08-19 MED ORDER — METRONIDAZOLE 500 MG PO TABS
500.0000 mg | ORAL_TABLET | Freq: Once | ORAL | Status: AC
  Administered 2014-08-19: 500 mg via ORAL
  Filled 2014-08-19: qty 1

## 2014-08-19 MED ORDER — DOCUSATE SODIUM 100 MG PO CAPS: 100.0000 mg | ORAL_CAPSULE | Freq: Two times a day (BID) | ORAL | Status: DC

## 2014-08-19 MED ORDER — MORPHINE SULFATE 4 MG/ML IJ SOLN
4.0000 mg | Freq: Once | INTRAMUSCULAR | Status: AC
  Administered 2014-08-19: 4 mg via INTRAVENOUS
  Filled 2014-08-19: qty 1

## 2014-08-19 NOTE — ED Notes (Signed)
PT c/o abdominal bloating and pain with nausea/vomiting and no diarrhea x2 days. PT c/o diaphoresis last night and periumbical pain. PT reports having a normal BM yesterday.

## 2014-08-19 NOTE — ED Provider Notes (Signed)
This chart was scribed for [place], DO by Rayfield Citizen, ED Scribe. This patient was seen in room APA04/APA04 and the patient's care was started at 10:41 AM.   TIME SEEN: 10:41 AM  CHIEF COMPLAINT: Abdominal pain   HPI:   HPI Comments: Courtney Hale is a 48 y.o. female who presents to the Emergency Department complaining of 1 day of gradual onset abdominal pain. She reports associated nausea and vomiting. She states that she cannot eat or drink without pain; her bowel movements are normal. She reports that her stomach was distended upon waking this morning. She reports several years ago she had similar symptoms and had an endoscopy and colonoscopy by Dr. Oneida Alar. She states at that time she was tested for Crohn's disease and this was found to be negative.  She denies diarrhea, bloody or black tarry stools, urinary symptoms, vaginal discharge, sick contacts, recent travel, or prior abdominal surgery. She denies chest pain or shortness of breath.  ROS: See HPI Constitutional: no fever  Eyes: no drainage  ENT: no runny nose   Cardiovascular:  no chest pain  Resp: no SOB  GI: VOMITING  GU: no dysuria Integumentary: no rash  Allergy: no hives  Musculoskeletal: no leg swelling  Neurological: no slurred speech ROS otherwise negative  PAST MEDICAL HISTORY/PAST SURGICAL HISTORY:  Past Medical History  Diagnosis Date  . Melanoma   . Hypertension   . Depression     MEDICATIONS:  Prior to Admission medications   Medication Sig Start Date End Date Taking? Authorizing Provider  citalopram (CELEXA) 20 MG tablet Take 20 mg by mouth every morning.    Historical Provider, MD  Epinastine HCl 0.05 % ophthalmic solution  05/09/14   Historical Provider, MD  ibuprofen (ADVIL,MOTRIN) 200 MG tablet Take 800 mg by mouth as needed for pain or headache.    Historical Provider, MD  lisinopril (PRINIVIL,ZESTRIL) 20 MG tablet Take 20 mg by mouth every morning.     Historical Provider, MD  naproxen  (NAPROSYN) 500 MG tablet Take 1 tablet (500 mg total) by mouth 2 (two) times daily with a meal. Prn pain or migraine 08/09/14   Nilda Simmer, NP  norgestimate-ethinyl estradiol (ORTHO-CYCLEN,SPRINTEC,PREVIFEM) 0.25-35 MG-MCG tablet One po qd continuous 08/09/14   Nilda Simmer, NP  phentermine (ADIPEX-P) 37.5 MG tablet Take 1 tablet (37.5 mg total) by mouth daily before breakfast. 08/09/14   Nilda Simmer, NP  rizatriptan (MAXALT-MLT) 10 MG disintegrating tablet Take 1 tablet (10 mg total) by mouth as needed for migraine. May repeat in 2 hours if needed. Max 2 per 24 hours 08/09/14   Nilda Simmer, NP  topiramate (TOPAMAX) 50 MG tablet TAKE ONE TABLET BY MOUTH TWICE DAILY 08/09/14   Nilda Simmer, NP  verapamil (CALAN-SR) 180 MG CR tablet TAKE ONE TABLET BY MOUTH ONCE DAILY 08/09/14   Nilda Simmer, NP    ALLERGIES:  Allergies  Allergen Reactions  . Augmentin [Amoxicillin-Pot Clavulanate]     Gi upset  . Sulfa Antibiotics Other (See Comments)    Burning sensation, flushing to skin    SOCIAL HISTORY:  History  Substance Use Topics  . Smoking status: Never Smoker   . Smokeless tobacco: Not on file  . Alcohol Use: No     Comment: occassionally    FAMILY HISTORY: Family History  Problem Relation Age of Onset  . Diabetes Mother     EXAM: LMP 07/25/2014 CONSTITUTIONAL: Alert and oriented and responds appropriately to  questions. Well-appearing; well-nourished HEAD: Normocephalic EYES: Conjunctivae clear, PERRL ENT: normal nose; no rhinorrhea; moist mucous membranes; pharynx without lesions noted NECK: Supple, no meningismus, no LAD  CARD: regular rhythm and tachycardic; S1 and S2 appreciated; no murmurs, no clicks, no rubs, no gallops RESP: Normal chest excursion without splinting or tachypnea; breath sounds clear and equal bilaterally; no wheezes, no rhonchi, no rales,  ABD/GI: Diffusely tender, voluntary guarding, no tympany or fluid wave, normal bowel  sounds, no distention, no rebound, no peritoneal signs BACK:  The back appears normal and is non-tender to palpation, there is no CVA tenderness EXT: Normal ROM in all joints; non-tender to palpation; no edema; normal capillary refill; no cyanosis    SKIN: Normal color for age and race; warm NEURO: Moves all extremities equally PSYCH: The patient's mood and manner are appropriate. Grooming and personal hygiene are appropriate.  MEDICAL DECISION MAKING: Patient here with diffuse abdominal pain and vomiting.  Differential diagnosis includes Cholecystitis, pancreatitis, colitis, gastroenteritis, pyelonephritis, UTI. [place] labs, urine in a CT of her abdomen pelvis. Will give IV fluids and pain and nausea medicine.  ED PROGRESS: Patient's labs show a mild leukocytosis of 10.7 without left shift. Her electrolytes are normal.   Urine shows a possible urinary tract infection. Her CT scan shows in a rightness versus inflammatory bowel disease. She states she has been tested for Crohn's disease in the past and this has been negative. No family history of inflammatory bowel disease. She states she is feeling better and feel she can be discharged home. She is tolerating PO without further vomiting. Will discharge patient with prescriptions for Flagyl and Cipro as this will also treat her UTI. Will discharge with pain and nausea medicine. Discussed strict return precautions. Have advised her that she will need to follow-up with gastroenterology as she will likely need to repeat endoscopy and colonoscopy. She verbalized understanding and is comfortable with plan.     I personally performed the services described in this documentation, which was scribed in my presence. The recorded information has been reviewed and is accurate.     [place], DO 08/22/14 1238

## 2014-08-19 NOTE — Telephone Encounter (Signed)
Pt states she is having abd pain, not eating or drinking, vomiting. Started last night. Feels like she is going to pass out. Breaking out in cold sweats. Pt states her stomach is swollen and she cannot button pants. Advised pt to go directly to the ED. Pt agreed to go.

## 2014-08-19 NOTE — Discharge Instructions (Signed)
Abdominal Pain, Women °Abdominal (stomach, pelvic, or belly) pain can be caused by many things. It is important to tell your doctor: °· The location of the pain. °· Does it come and go or is it present all the time? °· Are there things that start the pain (eating certain foods, exercise)? °· Are there other symptoms associated with the pain (fever, nausea, vomiting, diarrhea)? °All of this is helpful to know when trying to find the cause of the pain. °CAUSES  °· Stomach: virus or bacteria infection, or ulcer. °· Intestine: appendicitis (inflamed appendix), regional ileitis (Crohn's disease), ulcerative colitis (inflamed colon), irritable bowel syndrome, diverticulitis (inflamed diverticulum of the colon), or cancer of the stomach or intestine. °· Gallbladder disease or stones in the gallbladder. °· Kidney disease, kidney stones, or infection. °· Pancreas infection or cancer. °· Fibromyalgia (pain disorder). °· Diseases of the female organs: °· Uterus: fibroid (non-cancerous) tumors or infection. °· Fallopian tubes: infection or tubal pregnancy. °· Ovary: cysts or tumors. °· Pelvic adhesions (scar tissue). °· Endometriosis (uterus lining tissue growing in the pelvis and on the pelvic organs). °· Pelvic congestion syndrome (female organs filling up with blood just before the menstrual period). °· Pain with the menstrual period. °· Pain with ovulation (producing an egg). °· Pain with an IUD (intrauterine device, birth control) in the uterus. °· Cancer of the female organs. °· Functional pain (pain not caused by a disease, may improve without treatment). °· Psychological pain. °· Depression. °DIAGNOSIS  °Your doctor will decide the seriousness of your pain by doing an examination. °· Blood tests. °· X-rays. °· Ultrasound. °· CT scan (computed tomography, special type of X-ray). °· MRI (magnetic resonance imaging). °· Cultures, for infection. °· Barium enema (dye inserted in the large intestine, to better view it with  X-rays). °· Colonoscopy (looking in intestine with a lighted tube). °· Laparoscopy (minor surgery, looking in abdomen with a lighted tube). °· Major abdominal exploratory surgery (looking in abdomen with a large incision). °TREATMENT  °The treatment will depend on the cause of the pain.  °· Many cases can be observed and treated at home. °· Over-the-counter medicines recommended by your caregiver. °· Prescription medicine. °· Antibiotics, for infection. °· Birth control pills, for painful periods or for ovulation pain. °· Hormone treatment, for endometriosis. °· Nerve blocking injections. °· Physical therapy. °· Antidepressants. °· Counseling with a psychologist or psychiatrist. °· Minor or major surgery. °HOME CARE INSTRUCTIONS  °· Do not take laxatives, unless directed by your caregiver. °· Take over-the-counter pain medicine only if ordered by your caregiver. Do not take aspirin because it can cause an upset stomach or bleeding. °· Try a clear liquid diet (broth or water) as ordered by your caregiver. Slowly move to a bland diet, as tolerated, if the pain is related to the stomach or intestine. °· Have a thermometer and take your temperature several times a day, and record it. °· Bed rest and sleep, if it helps the pain. °· Avoid sexual intercourse, if it causes pain. °· Avoid stressful situations. °· Keep your follow-up appointments and tests, as your caregiver orders. °· If the pain does not go away with medicine or surgery, you may try: °· Acupuncture. °· Relaxation exercises (yoga, meditation). °· Group therapy. °· Counseling. °SEEK MEDICAL CARE IF:  °· You notice certain foods cause stomach pain. °· Your home care treatment is not helping your pain. °· You need stronger pain medicine. °· You want your IUD removed. °· You feel faint or   lightheaded. °· You develop nausea and vomiting. °· You develop a rash. °· You are having side effects or an allergy to your medicine. °SEEK IMMEDIATE MEDICAL CARE IF:  °· Your  pain does not go away or gets worse. °· You have a fever. °· Your pain is felt only in portions of the abdomen. The right side could possibly be appendicitis. The left lower portion of the abdomen could be colitis or diverticulitis. °· You are passing blood in your stools (bright red or black tarry stools, with or without vomiting). °· You have blood in your urine. °· You develop chills, with or without a fever. °· You pass out. °MAKE SURE YOU:  °· Understand these instructions. °· Will watch your condition. °· Will get help right away if you are not doing well or get worse. °Document Released: 08/08/2007 Document Revised: 02/25/2014 Document Reviewed: 08/28/2009 °ExitCare® Patient Information ©2015 [employer]. This information is not intended to replace advice given to you by your health care provider. Make sure you discuss any questions you have with your health care provider. ° °Nausea and Vomiting °Nausea is a sick feeling that often comes before throwing up (vomiting). Vomiting is a reflex where stomach contents come out of your mouth. Vomiting can cause severe loss of body fluids (dehydration). Children and elderly adults can become dehydrated quickly, especially if they also have diarrhea. Nausea and vomiting are symptoms of a condition or disease. It is important to find the cause of your symptoms. °CAUSES  °· Direct irritation of the stomach lining. This irritation can result from increased acid production (gastroesophageal reflux disease), infection, food poisoning, taking certain medicines (such as nonsteroidal anti-inflammatory drugs), alcohol use, or tobacco use. °· Signals from the brain. These signals could be caused by a headache, heat exposure, an inner ear disturbance, increased pressure in the brain from injury, infection, a tumor, or a concussion, pain, emotional stimulus, or metabolic problems. °· An obstruction in the gastrointestinal tract (bowel obstruction). °· Illnesses such as diabetes,  hepatitis, gallbladder problems, appendicitis, kidney problems, cancer, sepsis, atypical symptoms of a heart attack, or eating disorders. °· Medical treatments such as chemotherapy and radiation. °· Receiving medicine that makes you sleep (general anesthetic) during surgery. °DIAGNOSIS °Your caregiver may ask for tests to be done if the problems do not improve after a few days. Tests may also be done if symptoms are severe or if the reason for the nausea and vomiting is not clear. Tests may include: °· Urine tests. °· Blood tests. °· Stool tests. °· Cultures (to look for evidence of infection). °· X-rays or other imaging studies. °Test results can help your caregiver make decisions about treatment or the need for additional tests. °TREATMENT °You need to stay well hydrated. Drink frequently but in small amounts. You may wish to drink water, sports drinks, clear broth, or eat frozen ice pops or gelatin dessert to help stay hydrated. When you eat, eating slowly may help prevent nausea. There are also some antinausea medicines that may help prevent nausea. °HOME CARE INSTRUCTIONS  °· Take all medicine as directed by your caregiver. °· If you do not have an appetite, do not force yourself to eat. However, you must continue to drink fluids. °· If you have an appetite, eat a normal diet unless your caregiver tells you differently. °¨ Eat a variety of complex carbohydrates (rice, wheat, potatoes, bread), lean meats, yogurt, fruits, and vegetables. °¨ Avoid high-fat foods because they are more difficult to digest. °· Drink enough water and fluids   to keep your urine clear or pale yellow. °· If you are dehydrated, ask your caregiver for specific rehydration instructions. Signs of dehydration may include: °¨ Severe thirst. °¨ Dry lips and mouth. °¨ Dizziness. °¨ Dark urine. °¨ Decreasing urine frequency and amount. °¨ Confusion. °¨ Rapid breathing or pulse. °SEEK IMMEDIATE MEDICAL CARE IF:  °· You have blood or brown flecks  (like coffee grounds) in your vomit. °· You have black or bloody stools. °· You have a severe headache or stiff neck. °· You are confused. °· You have severe abdominal pain. °· You have chest pain or trouble breathing. °· You do not urinate at least once every 8 hours. °· You develop cold or clammy skin. °· You continue to vomit for longer than 24 to 48 hours. °· You have a fever. °MAKE SURE YOU:  °· Understand these instructions. °· Will watch your condition. °· Will get help right away if you are not doing well or get worse. °Document Released: 10/11/2005 Document Revised: 01/03/2012 Document Reviewed: 03/10/2011 °ExitCare® Patient Information ©2015 [employer]. This information is not intended to replace advice given to you by your health care provider. Make sure you discuss any questions you have with your health care provider. ° °

## 2014-08-21 ENCOUNTER — Encounter: Payer: Self-pay | Admitting: Gastroenterology

## 2014-08-21 ENCOUNTER — Ambulatory Visit (INDEPENDENT_AMBULATORY_CARE_PROVIDER_SITE_OTHER): Payer: BC Managed Care – PPO | Admitting: Gastroenterology

## 2014-08-21 VITALS — BP 130/86 | HR 89 | Temp 98.6°F | Ht 63.5 in | Wt 161.4 lb

## 2014-08-21 DIAGNOSIS — K529 Noninfective gastroenteritis and colitis, unspecified: Secondary | ICD-10-CM

## 2014-08-21 NOTE — Patient Instructions (Signed)
Stop taking Naproxen.   Start taking Nexium 1 capsule each morning on an empty stomach for 14 days. I have provided samples.   I will be talking with Dr. Oneida Alar about any further work-up needed.

## 2014-08-21 NOTE — Progress Notes (Signed)
Primary Care Physician:  Rubbie Battiest, MD Primary Gastroenterologist:  Dr. Oneida Alar   Chief Complaint  Patient presents with  . Abdominal Pain  . Abdominal Cramping    HPI:   Courtney Hale is a 48 year old female presenting today after ED visit on 10/26 secondary to abdominal pain, bloating, N/V. 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 Interestingly, she was evaluated in 2011 secondary to abdominal pain, with enteritis on CT in April 2011. Underwent EGD with push enteroscopy. Capsule study with erosions/ulcer. Seen at [facility] for DBE, which was normal and path negative for Crohn's. It was felt that the findings were secondary to NSAIDs/aspirin powders.   Sunday didn't feel well. Felt like she was getting ready to pass out. Acute onset of N/V. Had upper abdominal pain, felt like stabbing, continued all night long. Repeated N/V. Felt like abdomen was distended/bloated. Sunday had 2 normal BMs. Tried to work 2 hours on Monday. Went to ED. This time felt completely different. Felt like she was pregnant. Ate soup and crackers but no BM since Sunday. On antibiotics. When eats, has pain in epigastric abdomen, radiates. Antibiotics helping to ease. Feels like if she could have a BM would have some relief. Able to touch stomach now but wasn't before due to such severe pain. No sick contacts. Started on Cipro and Flagyl. Stool softeners BID.   Over a year ago placed back on migraine medications. Takes ibuprofen intermittently not daily like last time. Naproxen BID as needed.   Past Medical History  Diagnosis Date  . Hypertension   . Depression   . Melanoma   . Migraines     Past Surgical History  Procedure Laterality Date  . Melanoma excision    . Tonsillectomy    . Skin biopsy    . Cyst excision    . G[facility]ens capsule study  May 2011    several erosions, ulcers. Referred for DBE at [facility].   . Double balloon enteroscopy  2011    Dr. Arsenio Loader at [facility]: no erosions, no evidence  of Crohn's disease, path without Crohn's.   Toy [place] with push enteroscopy  2011    Dr. Oneida Alar: patent distal peptic stricture with diffuse antral erythema, normal D1 and D2     Current Outpatient Prescriptions  Medication Sig Dispense Refill  . ciprofloxacin (CIPRO) 500 MG tablet Take 1 tablet (500 mg total) by mouth 2 (two) times daily.  20 tablet  0  . citalopram (CELEXA) 20 MG tablet Take 20 mg by mouth every morning.      . docusate sodium (COLACE) 100 MG capsule Take 1 capsule (100 mg total) by mouth every 12 (twelve) hours.  60 capsule  0  . lisinopril (PRINIVIL,ZESTRIL) 20 MG tablet Take 20 mg by mouth every morning.       . metroNIDAZOLE (FLAGYL) 500 MG tablet Take 1 tablet (500 mg total) by mouth 2 (two) times daily.  20 tablet  0  . naproxen (NAPROSYN) 500 MG tablet Take 1 tablet (500 mg total) by mouth 2 (two) times daily with a meal. Prn pain or migraine  30 tablet  2  . norgestimate-ethinyl estradiol (ORTHO-CYCLEN,SPRINTEC,PREVIFEM) 0.25-35 MG-MCG tablet One po qd continuous  1 Package  11  . ondansetron (ZOFRAN ODT) 4 MG disintegrating tablet Take 1 tablet (4 mg total) by mouth every 8 (eight) hours as needed for nausea or vomiting.  20 tablet  0  . oxyCODONE-acetaminophen (PERCOCET/ROXICET) 5-325 MG per tablet Take 1  tablet by mouth every 4 (four) hours as needed.  25 tablet  0  . phentermine (ADIPEX-P) 37.5 MG tablet Take 1 tablet (37.5 mg total) by mouth daily before breakfast.  30 tablet  2  . rizatriptan (MAXALT-MLT) 10 MG disintegrating tablet Take 1 tablet (10 mg total) by mouth as needed for migraine. May repeat in 2 hours if needed. Max 2 per 24 hours  10 tablet  11  . topiramate (TOPAMAX) 50 MG tablet TAKE ONE TABLET BY MOUTH TWICE DAILY  60 tablet  5  . verapamil (CALAN-SR) 180 MG CR tablet TAKE ONE TABLET BY MOUTH ONCE DAILY  30 tablet  5   No current facility-administered medications for this visit.    Allergies as of 08/21/2014 - Review Complete 08/21/2014  Allergen  Reaction Noted  . Augmentin [amoxicillin-pot clavulanate]  01/27/2013  . Sulfa antibiotics Other (See Comments) 12/05/2012    Family History  Problem Relation Age of Onset  . Diabetes Mother   . Colon cancer Neg Hx     History   Social History  . Marital Status: Divorced    Spouse Name: N/A    Number of Children: N/A  . Years of Education: N/A   Occupational History  . Not on file.   Social History Main Topics  . Smoking status: Never Smoker   . Smokeless tobacco: Not on file  . Alcohol Use: No     Comment: occassionally  . Drug Use: No  . Sexual Activity: Yes    Birth Control/ Protection: Pill, Condom   Other Topics Concern  . Not on file   Social History Narrative  . No narrative on file    Review of Systems: As mentioned in HPI.   Physical Exam: BP 130/86  Pulse 89  Temp(Src) 98.6 F (37 C) (Oral)  Ht 5' 3.5" (1.613 m)  Wt 161 lb 6.4 oz (73.211 kg)  BMI 28.14 kg/m2  LMP 07/25/2014 General:   Alert and oriented. Pleasant and cooperative. Well-nourished and well-developed.  Head:  Normocephalic and atraumatic. Eyes:  Without icterus, sclera clear and conjunctiva pink.  Ears:  Normal auditory acuity. Nose:  No deformity, discharge,  or lesions. Mouth:  No deformity or lesions, oral mucosa pink.  Lungs:  Clear to auscultation bilaterally. No wheezes, rales, or rhonchi. No distress.  Heart:  S1, S2 present without murmurs appreciated.  Abdomen:  +BS, soft, non-tender and non-distended. No HSM noted. No guarding or rebound. No masses appreciated.  Rectal:  Deferred  Msk:  Symmetrical without gross deformities. Normal posture. Extremities:  Without clubbing or edema. Neurologic:  Alert and  oriented x4;  grossly normal neurologically. Skin:  Intact without significant lesions or rashes. Psych:  Alert and cooperative. Normal mood and affect.   Lab Results  Component Value Date   WBC 10.7* 08/19/2014   HGB 13.6 08/19/2014   HCT 39.5 08/19/2014   MCV  86.6 08/19/2014   PLT 377 08/19/2014   Lab Results  Component Value Date   ALT 12 08/19/2014   AST 14 08/19/2014   ALKPHOS 62 08/19/2014   BILITOT 0.3 08/19/2014   Lab Results  Component Value Date   CREATININE 0.86 08/19/2014   BUN 17 08/19/2014   NA 138 08/19/2014   K 3.4* 08/19/2014   CL 104 08/19/2014   CO2 19 08/19/2014

## 2014-08-21 NOTE — Assessment & Plan Note (Signed)
48 year old female with acute N/V and abdominal pain recently, with CT showing enteritis. Clinically improved with empiric antibiotics. No evidence of diarrhea; in fact, she feels constipated. Prior work-up in 2011 extensively with small bowel erosions/ulcers secondary to NSAIDs/aspirin powders. No longer uses Excedrin; however, she has been taking Naproxen frequently and ibuprofen sparingly. Discussed avoidance of these agents indefinitely if at all possible.   Will start on Nexium X 2 weeks; may need to continue if she notes relief from this Likely dealing with self-limiting illness. However, due to her history, may benefit from further evaluation. Will discuss with Dr. Oneida Alar.  Miralax for constipation.

## 2014-08-21 NOTE — Progress Notes (Signed)
cc'ed to pcp °

## 2014-08-22 ENCOUNTER — Telehealth: Payer: Self-pay | Admitting: Gastroenterology

## 2014-08-22 NOTE — Telephone Encounter (Signed)
Pt seen Courtney Hale yesterday and needs a work note dated with today's date so she can return to work today. She will be stopping by during lunch to pick it up.

## 2014-08-22 NOTE — Telephone Encounter (Signed)
Letter is at front

## 2014-08-22 NOTE — Telephone Encounter (Signed)
Yes, that is fine. 

## 2014-08-22 NOTE — Telephone Encounter (Signed)
Courtney Hale is aware that letter is at the front.

## 2014-09-22 NOTE — Progress Notes (Signed)
REVIEWED. I PERSONALLY REVIEWED CT FROM OCT 2015 WITH DR. HASSELL. CT COMPARED TO APR 2011. PRIOR CT SHOWED 20 CM SEGMENT ?MID-JEJUNUM. DBE/Bx AT [facility] NEGATIVE FOR IBD. CT OCT 2015 SHOWED  SHORTER SEGMENT  PROXIMAL TO THE MID TO DISTAL JEJUNUM. PREVIOUS SEGMENT LESS PROMINENT BUT NOT NORMAL.  LAST GIVENS MAY 2011: ULCERS AT 2 HRS WHILE PT TAKING HIGH DOSE EXCEDRIN. TCS MAY 2011-NL COLON Bx, APR 2011-H PYLORI GASTRITIS. PT CONTINUES TO USE NSAIDs (IBUPROFEN/NAPROXEN).  PLEASE CALL PT. SHE NEEDS PUSH ENTEROSCOPY/POSSIBLE GIVENS CAPSULE PLACEMENT AND BIOPSY IN DEC 2015 WITH MAC. PT FAILED CONSCIOUS SEDATION. SHE NEEDS A IBD PANEL AS WELL.

## 2014-09-24 NOTE — Progress Notes (Signed)
I have faxed her information to her insurance company to see if she needs a PA for the [employer]

## 2014-09-27 NOTE — Progress Notes (Signed)
Doris: [facility] is working on capsule study. We need to send off an IBD panel to [facility].

## 2014-10-01 NOTE — Progress Notes (Signed)
LMOM to call and order faxed to [facility].

## 2014-10-02 NOTE — Progress Notes (Signed)
Pt called and is aware to go to the lab.  

## 2014-10-03 ENCOUNTER — Other Ambulatory Visit: Payer: Self-pay | Admitting: Gastroenterology

## 2014-10-03 ENCOUNTER — Other Ambulatory Visit: Payer: Self-pay

## 2014-10-03 NOTE — Progress Notes (Signed)
Talked with Courtney Hale at her insurance company and he said that she did not need a PA

## 2014-10-03 NOTE — Progress Notes (Signed)
Enterscopy and [facility] have been scheduled for 10/29/2013.  Mailed pt info.

## 2014-10-03 NOTE — Telephone Encounter (Signed)
Opened in error

## 2014-10-04 LAB — PROMETHEUS-MAIL

## 2014-10-09 NOTE — Progress Notes (Signed)
Quick Note:  Has the [facility] lab come in? ______

## 2014-10-14 NOTE — Progress Notes (Signed)
Quick Note:  I called [facility] and results will be faxed shortly. ______

## 2014-10-15 NOTE — Progress Notes (Signed)
Quick Note:  Scheduled for EGD with enteroscopy early Jan. Please let pt know IBD panel not consistent with IBD. Enteroscopy will help to shed light on clinical picture. ______

## 2014-10-15 NOTE — Progress Notes (Signed)
Quick Note:  [facility] labs reviewed. RESULTS: pattern NOT consistent with IBD. ______

## 2014-10-16 NOTE — Progress Notes (Signed)
Quick Note:  Pt returned call and was informed. ______ 

## 2014-10-16 NOTE — Progress Notes (Signed)
Quick Note:  LMOM to call. ______ 

## 2014-10-21 ENCOUNTER — Telehealth: Payer: Self-pay | Admitting: Gastroenterology

## 2014-10-21 ENCOUNTER — Encounter (HOSPITAL_COMMUNITY)
Admission: RE | Admit: 2014-10-21 | Discharge: 2014-10-21 | Disposition: A | Discharge status: Home / Self Care | Payer: BC Managed Care – PPO | Source: Ambulatory Visit | Attending: Gastroenterology | Admitting: Gastroenterology

## 2014-10-21 NOTE — Telephone Encounter (Signed)
Patient called today to cancel her procedure with [facility] on 10/29/14. She is having other health issues she wants to take care of first and will call back later to reschedule.

## 2014-10-21 NOTE — Telephone Encounter (Signed)
Noted. Procedures have been cancelled per pt request.

## 2014-10-26 HISTORY — PX: LAPAROSCOPIC ASSISTED VAGINAL HYSTERECTOMY: SHX5398

## 2014-10-29 ENCOUNTER — Ambulatory Visit (HOSPITAL_COMMUNITY)
Admission: RE | Admit: 2014-10-29 | Payer: BC Managed Care – PPO | Source: Ambulatory Visit | Admitting: Gastroenterology

## 2014-10-29 ENCOUNTER — Encounter (HOSPITAL_COMMUNITY): Admission: RE | Payer: Self-pay | Source: Ambulatory Visit

## 2014-10-29 SURGERY — ENTEROSCOPY
Anesthesia: Monitor Anesthesia Care

## 2014-11-08 ENCOUNTER — Ambulatory Visit: Payer: BC Managed Care – PPO | Admitting: Nurse Practitioner

## 2014-11-22 ENCOUNTER — Encounter: Payer: Self-pay | Admitting: Gastroenterology

## 2015-05-08 ENCOUNTER — Other Ambulatory Visit: Payer: Self-pay | Admitting: Nurse Practitioner

## 2015-05-09 NOTE — Telephone Encounter (Signed)
Needs office visit.

## 2015-05-23 ENCOUNTER — Telehealth: Payer: Self-pay | Admitting: Family Medicine

## 2015-05-23 ENCOUNTER — Encounter: Payer: Self-pay | Admitting: Family Medicine

## 2015-05-23 ENCOUNTER — Ambulatory Visit (INDEPENDENT_AMBULATORY_CARE_PROVIDER_SITE_OTHER): Payer: BLUE CROSS/BLUE SHIELD | Admitting: Family Medicine

## 2015-05-23 VITALS — BP 128/82 | Temp 98.8°F | Ht 63.5 in | Wt 167.6 lb

## 2015-05-23 DIAGNOSIS — G43829 Menstrual migraine, not intractable, without status migrainosus: Secondary | ICD-10-CM

## 2015-05-23 DIAGNOSIS — N943 Premenstrual tension syndrome: Secondary | ICD-10-CM

## 2015-05-23 DIAGNOSIS — J42 Unspecified chronic bronchitis: Secondary | ICD-10-CM

## 2015-05-23 DIAGNOSIS — I10 Essential (primary) hypertension: Secondary | ICD-10-CM | POA: Diagnosis not present

## 2015-05-23 MED ORDER — BENZONATATE 100 MG PO CAPS: 100.0000 mg | ORAL_CAPSULE | Freq: Three times a day (TID) | ORAL | Status: DC | PRN

## 2015-05-23 MED ORDER — CLARITHROMYCIN 500 MG PO TABS: 500.0000 mg | ORAL_TABLET | Freq: Two times a day (BID) | ORAL | Status: DC

## 2015-05-23 MED ORDER — ALBUTEROL SULFATE HFA 108 (90 BASE) MCG/ACT IN AERS: 2.0000 | INHALATION_SPRAY | Freq: Four times a day (QID) | RESPIRATORY_TRACT | Status: DC | PRN

## 2015-05-23 MED ORDER — RIZATRIPTAN BENZOATE 10 MG PO TBDP: 10.0000 mg | ORAL_TABLET | ORAL | Status: DC | PRN

## 2015-05-23 MED ORDER — LISINOPRIL 20 MG PO TABS: 20.0000 mg | ORAL_TABLET | Freq: Every morning | ORAL | Status: DC

## 2015-05-23 MED ORDER — TOPIRAMATE 50 MG PO TABS: 50.0000 mg | ORAL_TABLET | Freq: Two times a day (BID) | ORAL | Status: DC

## 2015-05-23 MED ORDER — VERAPAMIL HCL ER 180 MG PO TBCR: EXTENDED_RELEASE_TABLET | ORAL | Status: DC

## 2015-05-23 NOTE — Telephone Encounter (Signed)
Pt called stating that she has had a cough since April and its not getting better. Pt is either wanting to be seen or something called in.

## 2015-05-23 NOTE — Telephone Encounter (Signed)
Patient has not been seen since October 2015. Patient needs to be seen for this issue. Patient transferred to front desk to schedule appointment.

## 2015-05-23 NOTE — Progress Notes (Signed)
   Subjective:    Patient ID: Courtney Hale, female    DOB: 1966/08/01, 49 y.o.   MRN: [id number]  Cough This is a new problem. Associated symptoms include headaches, nasal congestion and a sore throat. Treatments tried: tylenol.    Using claritin daily plus sinus med prn   Cough generlly nonproductive, also with a sense of wheeziness and tightness at times. Has never used an inhaler before.   Patient is a nonsmoker no substantial smoke exposure  Unsteady at times  Uses topomax faithfully, min prob with mighraines headaches these days. Utilizes Maxalt approximately twice a month. Helped considerably.  Compliant with blood pressure medication. No obvious side effects. Watching salt intake. Numbers generally good when checked elsewhere.  Review of Systems  HENT: Positive for sore throat.   Respiratory: Positive for cough.   Neurological: Positive for headaches.       Objective:   Physical Exam Alert vitals stable no acute distress H&T normal. Lungs bilateral wheeze during cough no tachypnea heart regular in rhythm. Blood pressure good on repeat.       Assessment & Plan:  Impression 1 hypertension good control discussed #2 chronic migraine headaches good control #3 chronic cough with subacute bronchitis/reactive airway pattern plan albuterol proper use his "2 sprays 4 times a day. Prednisone and Tessalon. Rationale discussed. Chronic medications refilled. Diet exercise discussed. Check in 6 months. WSL"

## 2015-08-13 ENCOUNTER — Encounter: Payer: Self-pay | Admitting: Gastroenterology

## 2015-08-13 ENCOUNTER — Ambulatory Visit (INDEPENDENT_AMBULATORY_CARE_PROVIDER_SITE_OTHER): Payer: BLUE CROSS/BLUE SHIELD | Admitting: Gastroenterology

## 2015-08-13 ENCOUNTER — Other Ambulatory Visit: Payer: Self-pay

## 2015-08-13 VITALS — BP 115/77 | HR 72 | Temp 97.4°F | Ht 63.5 in | Wt 170.2 lb

## 2015-08-13 DIAGNOSIS — R197 Diarrhea, unspecified: Secondary | ICD-10-CM | POA: Diagnosis not present

## 2015-08-13 DIAGNOSIS — R194 Change in bowel habit: Secondary | ICD-10-CM

## 2015-08-13 LAB — TSH: TSH: 0.785 u[IU]/mL (ref 0.350–4.500)

## 2015-08-13 NOTE — Progress Notes (Signed)
ON RECALL  °

## 2015-08-13 NOTE — Patient Instructions (Signed)
DRINK WATER TO KEEP YOUR URINE LIGHT YELLOW.  FOLLOW A HIGH FIBER DIET. SEE INFO BELOW.  SUBMIT STOOL STUDIES.  COLONOSCOPY WITHIN THE NEXT 2-3 WEEKS.SUPREP SAMPLE. PHENERGAN IN PREOP  TRY VIBERZI. TAKE ONE TABLET TWICE DAILY. USE IMODIUM 1 OR 2 TIMES A DAY IF NEEDED TO SLOW DOWN DIARRHEA.  PLEASE CALL IF YOU GO MORE THAN 4 DAYS WITHOUT A BOWEL MOVEMENT OR YOU DEVELOP INCREASE IN NAUSEA, VOMITING, OR ABDOMINAL PAIN.  FOLLOW UP IN 3 MOS.   High-Fiber Diet A high-fiber diet changes your normal diet to include more whole grains, legumes, fruits, and vegetables. Changes in the diet involve replacing refined carbohydrates with unrefined foods. The calorie level of the diet is essentially unchanged. The Dietary Reference Intake (recommended amount) for adult males is 38 grams per day. For adult females, it is 25 grams per day. Pregnant and lactating women should consume 28 grams of fiber per day. Fiber is the intact part of a plant that is not broken down during digestion. Functional fiber is fiber that has been isolated from the plant to provide a beneficial effect in the body. PURPOSE  Increase stool bulk.   Ease and regulate bowel movements.   Lower cholesterol.  INDICATIONS THAT YOU NEED MORE FIBER  Constipation and hemorrhoids.   Uncomplicated diverticulosis (intestine condition) and irritable bowel syndrome.   Weight management.   As a protective measure against hardening of the arteries (atherosclerosis), diabetes, and cancer.   GUIDELINES FOR INCREASING FIBER IN THE DIET  Start adding fiber to the diet slowly. A gradual increase of about 5 more grams (2 slices of whole-wheat bread, 2 servings of most fruits or vegetables, or 1 bowl of high-fiber cereal) per day is best. Too rapid an increase in fiber may result in constipation, flatulence, and bloating.   Drink enough water and fluids to keep your urine clear or pale yellow. Water, juice, or caffeine-free drinks are  recommended. Not drinking enough fluid may cause constipation.   Eat a variety of high-fiber foods rather than one type of fiber.   Try to increase your intake of fiber through using high-fiber foods rather than fiber pills or supplements that contain small amounts of fiber.   The goal is to change the types of food eaten. Do not supplement your present diet with high-fiber foods, but replace foods in your present diet.  INCLUDE A VARIETY OF FIBER SOURCES  Replace refined and processed grains with whole grains, canned fruits with fresh fruits, and incorporate other fiber sources. White rice, white breads, and most bakery goods contain little or no fiber.   Brown whole-grain rice, buckwheat oats, and many fruits and vegetables are all good sources of fiber. These include: broccoli, Brussels sprouts, cabbage, cauliflower, beets, sweet potatoes, white potatoes (skin on), carrots, tomatoes, eggplant, squash, berries, fresh fruits, and dried fruits.   Cereals appear to be the richest source of fiber. Cereal fiber is found in whole grains and bran. Bran is the fiber-rich outer coat of cereal grain, which is largely removed in refining. In whole-grain cereals, the bran remains. In breakfast cereals, the largest amount of fiber is found in those with "bran" in their names. The fiber content is sometimes indicated on the label.   You may need to include additional fruits and vegetables each day.   In baking, for 1 cup white flour, you may use the following substitutions:   1 cup whole-wheat flour minus 2 tablespoons.   1/2 cup white flour plus 1/2 cup  whole-wheat flour.

## 2015-08-13 NOTE — Progress Notes (Signed)
CC'ED TO PCP 

## 2015-08-13 NOTE — Assessment & Plan Note (Addendum)
SYMPTOMS NOT CONTROLLED and associated with rectal urgency.  DIFFERENTIAL DIAGNOSIS INCLUDES: IBS-D, MICROSCOPIC COLITIS. LESS LIKELY CELIAC SPRUE, THYROID DISTURBANCE, GIARDIASIS, C DIFF COLITIS, OR IBD.  DRINK WATER TO KEEP YOUR URINE LIGHT YELLOW. FOLLOW A HIGH FIBER DIET. SEE INFO BELOW. SUBMIT STOOL STUDIES AND BLOOD SAMPLE. TCS WITHIN THE NEXT 2-3 WEEKS.SUPREP SAMPLE. PHENERGAN IN PREOP. DISCUSSED PROCEDURE, BENEFITS, & RISKS: < 1% chance of medication reaction, bleeding, perforation, or rupture of spleen/liver. TRY VIBERZI. TAKE ONE TABLET TWICE DAILY. USE IMODIUM 1 OR 2 TIMES A DAY IF NEEDED. CALL IF YOU GO MORE THAN 4 DAYS WITHOUT A BOWEL MOVEMENT OR YOU DEVELOP INCREASE IN NAUSEA, VOMITING, OR ABDOMINAL PAIN. FOLLOW UP IN 3 MOS.

## 2015-08-13 NOTE — Progress Notes (Signed)
Subjective:    Patient ID: Courtney Hale, female    DOB: 23-May-1966, 49 y.o.   MRN: [id number] Courtney Hillier, MD   HPI LAST SEEN OCT 2015: 161 LBS. CHANGE IN BOWEL IN HABITS 2.5 MOS AGO. HAVING TROUBLE WITH LLQ ABDOMINAL PAIN(NOT RELATED TO BMs, ALL THE TIME). FEELS LIKE MENSTRUAL PAIN.WORRIED SHE'S GOING TO GETTING IN TROUBLE AT WORK BECAUSE SHE IS HAVING ABDOMINAL PAIN/DIARRHEA. FEELS BOATED. BP DROPPED OTHER WEEK AT WORK. NAUSEA: ALL THE TIME. USING TYLENOL FOR ABDOMINAL PAIN. HAPPENING EVERY DAY. ABDOMINAL PAIN HAS HEMORRHOID FROM SITTING ON TOILET. STOPPED NAPROXEN. BMs: WATERY(5-10), SML; AMOUNT. LAST SOLID STOOL: 2.5 MOS AGO. NO ABX, WELL WATER, OR TRAVEL.VOMTIING SOM EON AND OFF: 4 TIMES IN OAST 2.5 MOS(NO BLOOD). PAIN IN CHEST AND HYPERVENTILATING. FEELS DEPRESSED IN SPITE OF CELEXA. NOT SLEEPING BUT 2 HRS A NIGHT. DOESN'T KNOW WHY SHE'S DEPRESSED-?MEDICAL ISSSUES. DIVORCED NOW FOR PAST 5 YRS. 2 KIDS AT THE HOUSE: 26, 14 & 4.  PT DENIES FEVER, CHILLS, HEMATOCHEZIA, melena, CHEST PAIN, SHORTNESS OF BREATH, constipation, problems swallowing, problems with sedation, OR  heartburn or indigestion.   Past Medical History  Diagnosis Date  . Hypertension   . Depression   . Melanoma   . Migraines    Past Surgical History  Procedure Laterality Date  . Melanoma excision    . Tonsillectomy    . Skin biopsy    . Cyst excision    . Givens capsule study  May 2011    several erosions, ulcers. Referred for DBE at [facility].   . Double balloon enteroscopy  2011    Dr. Arsenio Loader at [facility]: no erosions, no evidence of Crohn's disease, path without Crohn's.   Toy Cookey with push enteroscopy  2011    Dr. Oneida Alar: patent distal peptic stricture with diffuse antral erythema, normal D1 and D2    Allergies  Allergen Reactions  . Augmentin [Amoxicillin-Pot Clavulanate]     Gi upset  . Sulfa Antibiotics Other (See Comments)    Burning sensation, flushing to skin   Current Outpatient Prescriptions    Medication Sig Dispense Refill  . citalopram (CELEXA) 20 MG tablet Take 20 mg by mouth every morning.    Marland Kitchen lisinopril (PRINIVIL,ZESTRIL) 20 MG tablet Take 1 tablet (20 mg total) by mouth every morning.    . norgestimate-ethinyl estradiol (ORTHO-CYCLEN,SPRINTEC,PREVIFEM) 0.25-35 MG-MCG tablet One po qd continuous    .      . rizatriptan (MAXALT-MLT) 10 MG disintegrating tablet Take 1 tablet (10 mg total) by mouth as needed for migraine. May repeat in 2 hours if needed. Max 2 per 24 hours    . topiramate (TOPAMAX) 50 MG tablet Take 1 tablet (50 mg total) by mouth 2 (two) times daily.    . verapamil (CALAN-SR) 180 MG CR tablet TAKE ONE TABLET BY MOUTH ONCE DAILY    .      .      .      .      .      .      .       Social History  Substance Use Topics  . Smoking status: Never Smoker   . Smokeless tobacco: None  . Alcohol Use: No     Comment: occasionally < 3 DRINKS PER DAY.   Review of Systems PER HPI OTHERWISE ALL SYSTEMS ARE NEGATIVE.    Objective:   Physical Exam  Constitutional: She is oriented to person, place, and time. She appears well-developed  and well-nourished. No distress.  HENT:  Head: Normocephalic and atraumatic.  Mouth/Throat: Oropharynx is clear and moist. No oropharyngeal exudate.  Eyes: Pupils are equal, round, and reactive to light. No scleral icterus.  Neck: Normal range of motion. Neck supple.  Cardiovascular: Normal rate, regular rhythm and normal heart sounds.   Pulmonary/Chest: Effort normal and breath sounds normal. No respiratory distress.  Abdominal: Soft. Bowel sounds are normal. She exhibits no distension. There is tenderness. There is no rebound and no guarding.  MILD LLQs TTP & PERUMBILICAL REGION  Musculoskeletal: She exhibits no edema.  Lymphadenopathy:    She has no cervical adenopathy.  Neurological: She is alert and oriented to person, place, and time.  NO FOCAL DEFICITS   Psychiatric: She has a normal mood and affect.  Vitals  reviewed.         Assessment & Plan:

## 2015-08-14 LAB — TISSUE TRANSGLUTAMINASE, IGA: TISSUE TRANSGLUTAMINASE AB, IGA: 1 U/mL (ref <4)

## 2015-08-25 ENCOUNTER — Telehealth: Payer: Self-pay

## 2015-08-25 NOTE — Telephone Encounter (Signed)
PLEASE CALL PT. HER THYROID TEST IS NORMAL. SHE DOES NOT HAVE CELIAC SPRUE.

## 2015-08-25 NOTE — Telephone Encounter (Signed)
PLEASE CALL PT. HER FMLA PAPERWORK WILL BE READY FOR PICK UP THUR NOV 3 AFTER 2 PM.

## 2015-08-25 NOTE — Telephone Encounter (Signed)
Pt called and is wondering if we have filled out her FMLA papers. Routing to Dr. Oneida Alar

## 2015-08-25 NOTE — Telephone Encounter (Signed)
Pt aware and wants to make sure that the dates are for 08/25/2015-08/27/2015

## 2015-08-26 LAB — GIARDIA ANTIGEN: Giardia Screen (EIA): NEGATIVE

## 2015-08-27 ENCOUNTER — Encounter (HOSPITAL_COMMUNITY)
Admission: RE | Disposition: A | Discharge status: Home / Self Care | Payer: Self-pay | Source: Ambulatory Visit | Attending: Gastroenterology

## 2015-08-27 ENCOUNTER — Encounter (HOSPITAL_COMMUNITY): Payer: Self-pay

## 2015-08-27 ENCOUNTER — Ambulatory Visit (HOSPITAL_COMMUNITY)
Admission: RE | Admit: 2015-08-27 | Discharge: 2015-08-27 | Disposition: A | Discharge status: Home / Self Care | Payer: BLUE CROSS/BLUE SHIELD | Source: Ambulatory Visit | Attending: Gastroenterology | Admitting: Gastroenterology

## 2015-08-27 DIAGNOSIS — Z79899 Other long term (current) drug therapy: Secondary | ICD-10-CM | POA: Insufficient documentation

## 2015-08-27 DIAGNOSIS — R194 Change in bowel habit: Secondary | ICD-10-CM | POA: Diagnosis not present

## 2015-08-27 DIAGNOSIS — K648 Other hemorrhoids: Secondary | ICD-10-CM | POA: Diagnosis not present

## 2015-08-27 DIAGNOSIS — R197 Diarrhea, unspecified: Secondary | ICD-10-CM | POA: Diagnosis present

## 2015-08-27 DIAGNOSIS — I10 Essential (primary) hypertension: Secondary | ICD-10-CM | POA: Diagnosis not present

## 2015-08-27 DIAGNOSIS — R109 Unspecified abdominal pain: Secondary | ICD-10-CM | POA: Diagnosis not present

## 2015-08-27 DIAGNOSIS — F329 Major depressive disorder, single episode, unspecified: Secondary | ICD-10-CM | POA: Diagnosis not present

## 2015-08-27 DIAGNOSIS — Q438 Other specified congenital malformations of intestine: Secondary | ICD-10-CM | POA: Diagnosis not present

## 2015-08-27 HISTORY — DX: Other complications of anesthesia, initial encounter: T88.59XA

## 2015-08-27 HISTORY — DX: Adverse effect of unspecified anesthetic, initial encounter: T41.45XA

## 2015-08-27 HISTORY — PX: COLONOSCOPY: SHX5424

## 2015-08-27 LAB — CLOSTRIDIUM DIFFICILE BY PCR: CDIFFPCR: NOT DETECTED

## 2015-08-27 SURGERY — COLONOSCOPY
Anesthesia: Moderate Sedation

## 2015-08-27 MED ORDER — PROMETHAZINE HCL 25 MG/ML IJ SOLN
INTRAMUSCULAR | Status: DC | PRN
  Administered 2015-08-27: 12.5 mg via INTRAVENOUS

## 2015-08-27 MED ORDER — STERILE WATER FOR IRRIGATION IR SOLN
Status: DC | PRN
  Administered 2015-08-27: 09:00:00

## 2015-08-27 MED ORDER — SODIUM CHLORIDE 0.9 % IV SOLN
INTRAVENOUS | Status: DC
  Administered 2015-08-27: 08:00:00 via INTRAVENOUS

## 2015-08-27 MED ORDER — PROMETHAZINE HCL 25 MG/ML IJ SOLN
INTRAMUSCULAR | Status: AC
  Filled 2015-08-27: qty 1

## 2015-08-27 MED ORDER — MEPERIDINE HCL 100 MG/ML IJ SOLN
INTRAMUSCULAR | Status: AC
  Filled 2015-08-27: qty 2

## 2015-08-27 MED ORDER — SODIUM CHLORIDE 0.9 % IJ SOLN
INTRAMUSCULAR | Status: AC
  Filled 2015-08-27: qty 3

## 2015-08-27 MED ORDER — MIDAZOLAM HCL 5 MG/5ML IJ SOLN
INTRAMUSCULAR | Status: DC | PRN
  Administered 2015-08-27: 1 mg via INTRAVENOUS
  Administered 2015-08-27: 2 mg via INTRAVENOUS
  Administered 2015-08-27 (×2): 1 mg via INTRAVENOUS

## 2015-08-27 MED ORDER — MEPERIDINE HCL 100 MG/ML IJ SOLN
INTRAMUSCULAR | Status: DC | PRN
  Administered 2015-08-27: 50 mg via INTRAVENOUS

## 2015-08-27 MED ORDER — MIDAZOLAM HCL 5 MG/5ML IJ SOLN
INTRAMUSCULAR | Status: AC
  Filled 2015-08-27: qty 10

## 2015-08-27 NOTE — Telephone Encounter (Signed)
Called pt and LMOM.  

## 2015-08-27 NOTE — Op Note (Signed)
[facility] [address] [place], [address]   COLONOSCOPY PROCEDURE REPORT  PATIENT: Courtney Hale, [place]  MR#: [id number] BIRTHDATE: Jul 25, 1966 , 42  yrs. old GENDER: female ENDOSCOPIST: Danie Binder, MD REFERRED WE:[id number] Wolfgang Phoenix, M.D. PROCEDURE DATE:  09/16/15 PROCEDURE:   Colonoscopy with biopsy INDICATIONS:unexplained diarrhea.  MOTHER SAID PT HAS ALWAYS HAD DIARRHEA. MEDICATIONS: Promethazine (Phenergan) 12.5 mg IV, Demerol 50 mg IV, and Versed 5 mg IV  DESCRIPTION OF PROCEDURE:    Physical exam was performed.  Informed consent was obtained from the patient after explaining the benefits, risks, and alternatives to procedure.  The patient was connected to monitor and placed in left lateral position. Continuous oxygen was provided by nasal cannula and IV medicine administered through an indwelling cannula.  After administration of sedation and rectal exam, the patients rectum was intubated and the EC-[id number] ([id number])  colonoscope was advanced under direct visualization to the ileum.  The scope was removed slowly by carefully examining the color, texture, anatomy, and integrity mucosa on the way out.  The patient was recovered in endoscopy and discharged home in satisfactory condition. Estimated blood loss is zero unless otherwise noted in this procedure report.    COLON FINDINGS: The examined terminal ileum appeared to be normal. 10-15 CM VISUALIZED.  , The colon was redundant.  Manual abdominal counter-pressure was used to reach the cecum, A normal appearing cecum, ileocecal valve, and appendiceal orifice were identified.  The ascending, transverse, descending, sigmoid colon, and rectum appeared unremarkable.  Multiple biopsies were performed using cold forceps.  , and Moderate sized internal hemorrhoids were found.  PREP QUALITY: excellent. CECAL W/D TIME: 12       minutes COMPLICATIONS: None  ENDOSCOPIC IMPRESSION: 1.   DIARRHEA MOST LIKELY DUE  TO IBS-D 2.   The LEFT colon IS redundant 3.   Moderate sized internal hemorrhoids  RECOMMENDATIONS: DRINK WATER TO KEEP URINE LIGHT YELLOW. FOLLOW A HIGH FIBER DIET. TRY VIBERZI.  TAKE ONE TABLET TWICE DAILY.  USE IMODIUM 1 OR 2 TIMES A DAY IF NEEDED TO SLOW DOWN DIARRHEA. CALL IF MORE THAN 4 DAYS WITHOUT A BOWEL MOVEMENT OR YOU DEVELOP INCREASE IN NAUSEA, VOMITING, OR ABDOMINAL PAIN. AWAIT BIOPSY. FOLLOW UP IN Jan 2017. Next colonoscopy in 10 years.   eSigned:  Danie Binder, MD 09/16/15 10:37 AM  CPT CODES: ICD CODES:  The ICD and CPT codes recommended by this software are interpretations from the data that the clinical staff has captured with the software.  The verification of the translation of this report to the ICD and CPT codes and modifiers is the sole responsibility of the health care institution and practicing physician where this report was generated.  [facility]. will not be held responsible for the validity of the ICD and CPT codes included on this report.  AMA assumes no liability for data contained or not contained herein. CPT is a Designer, television/film set of the [employer].

## 2015-08-27 NOTE — Discharge Instructions (Signed)
You have internal hemorrhoids. YOU DID NOT HAVE ANY POLYPS. I biopsied your colon.   DRINK WATER TO KEEP YOUR URINE LIGHT YELLOW.  FOLLOW A HIGH FIBER DIET. SEE INFO BELOW.  TRY VIBERZI. TAKE ONE TABLET TWICE DAILY. USE IMODIUM 1 OR 2 TIMES A DAY IF NEEDED TO SLOW DOWN DIARRHEA. Call for a prescription if it helps.  PLEASE CALL IF YOU GO MORE THAN 4 DAYS WITHOUT A BOWEL MOVEMENT OR YOU DEVELOP INCREASE IN NAUSEA, VOMITING, OR ABDOMINAL PAIN.  FOLLOW UP IN  Jan 2017.   Next colonoscopy in 10 years.   Colonoscopy Care After Read the instructions outlined below and refer to this sheet in the next week. These discharge instructions provide you with general information on caring for yourself after you leave the hospital. While your treatment has been planned according to the most current medical practices available, unavoidable complications occasionally occur. If you have any problems or questions after discharge, call DR. Deira Shimer, [phone].  ACTIVITY  You may resume your regular activity, but move at a slower pace for the next 24 hours.   Take frequent rest periods for the next 24 hours.   Walking will help get rid of the air and reduce the bloated feeling in your belly (abdomen).   No driving for 24 hours (because of the medicine (anesthesia) used during the test).   You may shower.   Do not sign any important legal documents or operate any machinery for 24 hours (because of the anesthesia used during the test).    NUTRITION  Drink plenty of fluids.   You may resume your normal diet as instructed by your doctor.   Begin with a light meal and progress to your normal diet. Heavy or fried foods are harder to digest and may make you feel sick to your stomach (nauseated).   Avoid alcoholic beverages for 24 hours or as instructed.    MEDICATIONS  You may resume your normal medications.   WHAT YOU CAN EXPECT TODAY  Some feelings of bloating in the abdomen.   Passage  of more gas than usual.   Spotting of blood in your stool or on the toilet paper  .  IF YOU HAD POLYPS REMOVED DURING THE COLONOSCOPY:  Eat a soft diet IF YOU HAVE NAUSEA, BLOATING, ABDOMINAL PAIN, OR VOMITING.    FINDING OUT THE RESULTS OF YOUR TEST Not all test results are available during your visit. DR. Oneida Alar WILL CALL YOU WITHIN 7 DAYS OF YOUR PROCEDUE WITH YOUR RESULTS. Do not assume everything is normal if you have not heard from DR. Bralyn Folkert IN ONE WEEK, CALL HER OFFICE AT [phone].  SEEK IMMEDIATE MEDICAL ATTENTION AND CALL THE OFFICE: [phone] IF:  You have more than a spotting of blood in your stool.   Your belly is swollen (abdominal distention).   You are nauseated or vomiting.   You have a temperature over 101F.   You have abdominal pain or discomfort that is severe or gets worse throughout the day.  High-Fiber Diet A high-fiber diet changes your normal diet to include more whole grains, legumes, fruits, and vegetables. Changes in the diet involve replacing refined carbohydrates with unrefined foods. The calorie level of the diet is essentially unchanged. The Dietary Reference Intake (recommended amount) for adult males is 38 grams per day. For adult females, it is 25 grams per day. Pregnant and lactating women should consume 28 grams of fiber per day. Fiber is the intact part of a plant that  is not broken down during digestion. Functional fiber is fiber that has been isolated from the plant to provide a beneficial effect in the body. PURPOSE  Increase stool bulk.   Ease and regulate bowel movements.   Lower cholesterol.  INDICATIONS THAT YOU NEED MORE FIBER  Constipation and hemorrhoids.   Uncomplicated diverticulosis (intestine condition) and irritable bowel syndrome.   Weight management.   As a protective measure against hardening of the arteries (atherosclerosis), diabetes, and cancer.   GUIDELINES FOR INCREASING FIBER IN THE DIET  Start adding  fiber to the diet slowly. A gradual increase of about 5 more grams (2 slices of whole-wheat bread, 2 servings of most fruits or vegetables, or 1 bowl of high-fiber cereal) per day is best. Too rapid an increase in fiber may result in constipation, flatulence, and bloating.   Drink enough water and fluids to keep your urine clear or pale yellow. Water, juice, or caffeine-free drinks are recommended. Not drinking enough fluid may cause constipation.   Eat a variety of high-fiber foods rather than one type of fiber.   Try to increase your intake of fiber through using high-fiber foods rather than fiber pills or supplements that contain small amounts of fiber.   The goal is to change the types of food eaten. Do not supplement your present diet with high-fiber foods, but replace foods in your present diet.  INCLUDE A VARIETY OF FIBER SOURCES  Replace refined and processed grains with whole grains, canned fruits with fresh fruits, and incorporate other fiber sources. White rice, white breads, and most bakery goods contain little or no fiber.   Brown whole-grain rice, buckwheat oats, and many fruits and vegetables are all good sources of fiber. These include: broccoli, Brussels sprouts, cabbage, cauliflower, beets, sweet potatoes, white potatoes (skin on), carrots, tomatoes, eggplant, squash, berries, fresh fruits, and dried fruits.   Cereals appear to be the richest source of fiber. Cereal fiber is found in whole grains and bran. Bran is the fiber-rich outer coat of cereal grain, which is largely removed in refining. In whole-grain cereals, the bran remains. In breakfast cereals, the largest amount of fiber is found in those with "bran" in their names. The fiber content is sometimes indicated on the label.   You may need to include additional fruits and vegetables each day.   In baking, for 1 cup white flour, you may use the following substitutions:   1 cup whole-wheat flour minus 2 tablespoons.    1/2 cup white flour plus 1/2 cup whole-wheat flour.   Hemorrhoids Hemorrhoids are dilated (enlarged) veins around the rectum. Sometimes clots will form in the veins. This makes them swollen and painful. These are called thrombosed hemorrhoids. Causes of hemorrhoids include:  Constipation.   Straining to have a bowel movement.   HEAVY LIFTING HOME CARE INSTRUCTIONS  Eat a well balanced diet and drink 6 to 8 glasses of water every day to avoid constipation. You may also use a bulk laxative.   Avoid straining to have bowel movements.   Keep anal area dry and clean.   Do not use a donut shaped pillow or sit on the toilet for long periods. This increases blood pooling and pain.   Move your bowels when your body has the urge; this will require less straining and will decrease pain and pressure.

## 2015-08-27 NOTE — Interval H&P Note (Signed)
History and Physical Interval Note:  08/27/2015 8:35 AM  Courtney Hale  has presented today for surgery, with the diagnosis of change in bowel habits  The various methods of treatment have been discussed with the patient and family. After consideration of risks, benefits and other options for treatment, the patient has consented to  Procedure(s) with comments: COLONOSCOPY (N/A) - 0830 as a surgical intervention .  The patient's history has been reviewed, patient examined, no change in status, stable for surgery.  I have reviewed the patient's chart and labs.  Questions were answered to the patient's satisfaction.     [employer]

## 2015-08-27 NOTE — H&P (View-Only) (Signed)
Subjective:    Patient ID: Courtney Hale, female    DOB: [birth year], 49 y.o.   MRN: [id number] Courtney Hillier, MD   HPI LAST SEEN OCT 2015: 161 LBS. CHANGE IN BOWEL IN HABITS 2.5 MOS AGO. HAVING TROUBLE WITH LLQ ABDOMINAL PAIN(NOT RELATED TO BMs, ALL THE TIME). FEELS LIKE MENSTRUAL PAIN.WORRIED SHE'S GOING TO GETTING IN TROUBLE AT WORK BECAUSE SHE IS HAVING ABDOMINAL PAIN/DIARRHEA. FEELS BOATED. BP DROPPED OTHER WEEK AT WORK. NAUSEA: ALL THE TIME. USING TYLENOL FOR ABDOMINAL PAIN. HAPPENING EVERY DAY. ABDOMINAL PAIN HAS HEMORRHOID FROM SITTING ON TOILET. STOPPED NAPROXEN. BMs: WATERY(5-10), SML; AMOUNT. LAST SOLID STOOL: 2.5 MOS AGO. NO ABX, WELL WATER, OR TRAVEL.VOMTIING SOM EON AND OFF: 4 TIMES IN OAST 2.5 MOS(NO BLOOD). PAIN IN CHEST AND HYPERVENTILATING. FEELS DEPRESSED IN SPITE OF CELEXA. NOT SLEEPING BUT 2 HRS A NIGHT. DOESN'T KNOW WHY SHE'S DEPRESSED-?MEDICAL ISSSUES. DIVORCED NOW FOR PAST 5 YRS. 2 KIDS AT THE HOUSE: 26, 14 & 4.  PT DENIES FEVER, CHILLS, HEMATOCHEZIA, melena, CHEST PAIN, SHORTNESS OF BREATH, constipation, problems swallowing, problems with sedation, OR  heartburn or indigestion.   Past Medical History  Diagnosis Date  . Hypertension   . Depression   . Melanoma   . Migraines    Past Surgical History  Procedure Laterality Date  . Melanoma excision    . Tonsillectomy    . Skin biopsy    . Cyst excision    . Givens capsule study  May 2011    several erosions, ulcers. Referred for DBE at [facility]   . Double balloon enteroscopy  2011    Dr. Arsenio Loader at [facility]: no erosions, no evidence of Crohn's disease, path without Crohn's.   Toy Cookey with push enteroscopy  2011    Dr. Oneida Alar: patent distal peptic stricture with diffuse antral erythema, normal D1 and D2    Allergies  Allergen Reactions  . Augmentin [Amoxicillin-Pot Clavulanate]     Gi upset  . Sulfa Antibiotics Other (See Comments)    Burning sensation, flushing to skin   Current Outpatient Prescriptions    Medication Sig Dispense Refill  . citalopram (CELEXA) 20 MG tablet Take 20 mg by mouth every morning.    Marland Kitchen lisinopril (PRINIVIL,ZESTRIL) 20 MG tablet Take 1 tablet (20 mg total) by mouth every morning.    . norgestimate-ethinyl estradiol (ORTHO-CYCLEN,SPRINTEC,PREVIFEM) 0.25-35 MG-MCG tablet One po qd continuous    .      . rizatriptan (MAXALT-MLT) 10 MG disintegrating tablet Take 1 tablet (10 mg total) by mouth as needed for migraine. May repeat in 2 hours if needed. Max 2 per 24 hours    . topiramate (TOPAMAX) 50 MG tablet Take 1 tablet (50 mg total) by mouth 2 (two) times daily.    . verapamil (CALAN-SR) 180 MG CR tablet TAKE ONE TABLET BY MOUTH ONCE DAILY    .      .      .      .      .      .      .       Social History  Substance Use Topics  . Smoking status: Never Smoker   . Smokeless tobacco: None  . Alcohol Use: No     Comment: occasionally < 3 DRINKS PER DAY.   Review of Systems PER HPI OTHERWISE ALL SYSTEMS ARE NEGATIVE.    Objective:   Physical Exam  Constitutional: She is oriented to person, place, and time. She appears well-developed  and well-nourished. No distress.  HENT:  Head: Normocephalic and atraumatic.  Mouth/Throat: Oropharynx is clear and moist. No oropharyngeal exudate.  Eyes: Pupils are equal, round, and reactive to light. No scleral icterus.  Neck: Normal range of motion. Neck supple.  Cardiovascular: Normal rate, regular rhythm and normal heart sounds.   Pulmonary/Chest: Effort normal and breath sounds normal. No respiratory distress.  Abdominal: Soft. Bowel sounds are normal. She exhibits no distension. There is tenderness. There is no rebound and no guarding.  MILD LLQs TTP & PERUMBILICAL REGION  Musculoskeletal: She exhibits no edema.  Lymphadenopathy:    She has no cervical adenopathy.  Neurological: She is alert and oriented to person, place, and time.  NO FOCAL DEFICITS   Psychiatric: She has a normal mood and affect.  Vitals  reviewed.         Assessment & Plan:

## 2015-08-28 NOTE — Telephone Encounter (Signed)
Pt is aware.  

## 2015-08-28 NOTE — Telephone Encounter (Signed)
PLEASE CALL PT. FMLA PAPERWORK FAXED TODAY AT [phone].

## 2015-09-02 ENCOUNTER — Other Ambulatory Visit: Payer: Self-pay | Admitting: Family Medicine

## 2015-09-03 ENCOUNTER — Encounter (HOSPITAL_COMMUNITY): Payer: Self-pay | Admitting: Gastroenterology

## 2015-09-04 NOTE — Telephone Encounter (Signed)
Ok six mo worth 

## 2015-09-06 ENCOUNTER — Telehealth: Payer: Self-pay | Admitting: Gastroenterology

## 2015-09-06 NOTE — Telephone Encounter (Signed)
Please call pt. Her colon biopsies are normal.   DRINK WATER TO KEEP YOUR URINE LIGHT YELLOW.  FOLLOW A HIGH FIBER DIET. SEE INFO BELOW.  CONTINUE VIBERZI. TAKE ONE TABLET TWICE DAILY. USE IMODIUM 1 OR 2 TIMES A DAY IF NEEDED TO SLOW DOWN DIARRHEA. Call for a prescription if it helps.  PLEASE CALL IF YOU GO MORE THAN 4 DAYS WITHOUT A BOWEL MOVEMENT OR YOU DEVELOP INCREASE IN NAUSEA, VOMITING, OR ABDOMINAL PAIN.  FOLLOW UP IN  Jan 2017 E30 DIARRHEA/ABDOMINAL PAIN.   Next colonoscopy in 10 years.

## 2015-09-08 ENCOUNTER — Telehealth: Payer: Self-pay | Admitting: Gastroenterology

## 2015-09-08 NOTE — Telephone Encounter (Signed)
Pt is aware and will need Rx sent to pharmacy.

## 2015-09-08 NOTE — Telephone Encounter (Signed)
Pt was calling DS back about her results. Pt hung up before DS could take the call.

## 2015-09-08 NOTE — Telephone Encounter (Signed)
ON RECALL  °

## 2015-09-08 NOTE — Telephone Encounter (Signed)
LMOM to call.

## 2015-09-08 NOTE — Telephone Encounter (Signed)
See result note.  

## 2015-09-08 NOTE — Telephone Encounter (Signed)
PLEASE CALL PT. SHE SHOULD CONTINUE VIBERZI FOR 3 MOS.

## 2015-09-08 NOTE — Telephone Encounter (Signed)
Pt is aware of results. She said she does not see that the Viberzi is helping much yet, how long should she expect before she can see that it helps?  She is still having a lot of watery diarrhea, has the urge to go a lot, but not a lot at the time. She is afraid she will get in trouble at work and is going to bring papers by for Dr. Oneida Alar to fill out for her.  Please advise!

## 2015-09-10 MED ORDER — ELUXADOLINE 100 MG PO TABS: 1.0000 | ORAL_TABLET | Freq: Two times a day (BID) | ORAL | Status: DC

## 2015-09-10 NOTE — Addendum Note (Signed)
Addended by: Gordy Levan, ERIC A on: 09/10/2015 10:03 PM   Modules accepted: Orders

## 2015-09-10 NOTE — Telephone Encounter (Signed)
What dosage?

## 2015-09-10 NOTE — Telephone Encounter (Signed)
Presume 100 mg based on rare ETOH use (<3 drinks/day), no history of pancreatitis, no noted cholecystectomy on surgical history.

## 2015-09-11 NOTE — Telephone Encounter (Signed)
Rx was sent by Walden Field, NP.

## 2015-09-23 NOTE — Telephone Encounter (Signed)
Pt left Vm that she had been unable to get her prescription for the Viberzi. Looks like it was prescribed by Walden Field, NP on 09/10/2015, but this does not go electronically, and must have not gotten faxed.  I called [employer] and give the verbal order from [place] from 09/10/2015 to [place] at the pharmacy. I called pt and LMOM that I had called in and she should be able to get it this evening.

## 2015-10-10 ENCOUNTER — Ambulatory Visit (INDEPENDENT_AMBULATORY_CARE_PROVIDER_SITE_OTHER): Payer: BLUE CROSS/BLUE SHIELD | Admitting: Nurse Practitioner

## 2015-10-10 ENCOUNTER — Ambulatory Visit (HOSPITAL_COMMUNITY)
Admission: RE | Admit: 2015-10-10 | Discharge: 2015-10-10 | Disposition: A | Discharge status: Home / Self Care | Payer: BLUE CROSS/BLUE SHIELD | Source: Ambulatory Visit | Attending: Nurse Practitioner | Admitting: Nurse Practitioner

## 2015-10-10 ENCOUNTER — Encounter: Payer: Self-pay | Admitting: Gastroenterology

## 2015-10-10 ENCOUNTER — Encounter: Payer: Self-pay | Admitting: Nurse Practitioner

## 2015-10-10 VITALS — BP 128/86 | Temp 99.3°F | Ht 63.5 in | Wt 171.0 lb

## 2015-10-10 DIAGNOSIS — R05 Cough: Secondary | ICD-10-CM | POA: Diagnosis not present

## 2015-10-10 DIAGNOSIS — J069 Acute upper respiratory infection, unspecified: Secondary | ICD-10-CM | POA: Diagnosis not present

## 2015-10-10 DIAGNOSIS — R062 Wheezing: Secondary | ICD-10-CM | POA: Insufficient documentation

## 2015-10-10 DIAGNOSIS — I10 Essential (primary) hypertension: Secondary | ICD-10-CM | POA: Insufficient documentation

## 2015-10-10 DIAGNOSIS — B9689 Other specified bacterial agents as the cause of diseases classified elsewhere: Secondary | ICD-10-CM

## 2015-10-10 DIAGNOSIS — R053 Chronic cough: Secondary | ICD-10-CM

## 2015-10-10 MED ORDER — PREDNISONE 20 MG PO TABS: ORAL_TABLET | ORAL | Status: DC

## 2015-10-10 MED ORDER — LOSARTAN POTASSIUM 100 MG PO TABS: 100.0000 mg | ORAL_TABLET | Freq: Every day | ORAL | Status: DC

## 2015-10-10 MED ORDER — HYDROCODONE-HOMATROPINE 5-1.5 MG/5ML PO SYRP: 5.0000 mL | ORAL_SOLUTION | ORAL | Status: DC | PRN

## 2015-10-10 MED ORDER — AZITHROMYCIN 250 MG PO TABS: ORAL_TABLET | ORAL | Status: DC

## 2015-10-10 MED ORDER — FLUTICASONE FUROATE-VILANTEROL 100-25 MCG/INH IN AEPB: INHALATION_SPRAY | RESPIRATORY_TRACT | Status: DC

## 2015-10-10 MED ORDER — BENZONATATE 100 MG PO CAPS: 100.0000 mg | ORAL_CAPSULE | Freq: Three times a day (TID) | ORAL | Status: DC | PRN

## 2015-10-14 ENCOUNTER — Other Ambulatory Visit: Payer: Self-pay | Admitting: Nurse Practitioner

## 2015-10-14 ENCOUNTER — Encounter: Payer: Self-pay | Admitting: Nurse Practitioner

## 2015-10-14 NOTE — Progress Notes (Signed)
Subjective:  Presents for complaints of cough that began in April. Seen in office in July. Used albuterol inhaler twice yesterday, tried to use today but threw up. Worse x 1 week. Non smoker. No fever. Facial headache. Sore throat. Runny nose. Frequent cough. Ear pain. Occasional wheeze. No reflux or abd pain.   Objective:   BP 128/86 mmHg  Temp(Src) 99.3 F (37.4 C) (Oral)  Ht 5' 3.5" (1.613 m)  Wt 171 lb (77.565 kg)  BMI 29.81 kg/m2  SpO2 97%  LMP 10/26/2014 NAD. Alert, oriented. TMs clear effusion. Pharynx erythematous with PND noted. Neck supple with mild anterior adenopathy. Lungs clear. No wheezing or tachypnea. Occasional congested cough. Heart RRR. Abdomen soft, non tender.   Assessment: Bacterial upper respiratory infection  Wheezing - Plan: DG Chest 2 View  Chronic cough possibly related to lisinopril use- Plan: DG Chest 2 View  Plan:  Meds ordered this encounter  Medications  . losartan (COZAAR) 100 MG tablet    Sig: Take 1 tablet (100 mg total) by mouth daily.    Dispense:  30 tablet    Refill:  5    Order Specific Question:  Supervising Provider    Answer:  [name] [[id number]]  . azithromycin (ZITHROMAX Z-PAK) 250 MG tablet    Sig: Take 2 tablets (500 mg) on  Day 1,  followed by 1 tablet (250 mg) once daily on Days 2 through 5.    Dispense:  6 each    Refill:  0    Order Specific Question:  Supervising Provider    Answer:  [name] [[id number]]  . predniSONE (DELTASONE) 20 MG tablet    Sig: 3 po qd x 3 d then 2 po qd x 3 d then 1 po qd x 3 d    Dispense:  18 tablet    Refill:  0    Order Specific Question:  Supervising Provider    Answer:  [name] [[id number]]  . benzonatate (TESSALON) 100 MG capsule    Sig: Take 1 capsule (100 mg total) by mouth 3 (three) times daily as needed for cough.    Dispense:  30 capsule    Refill:  2    Order Specific Question:  Supervising Provider    Answer:  [name] [[id number]]  . HYDROcodone-homatropine  (HYCODAN) 5-1.5 MG/5ML syrup    Sig: Take 5 mLs by mouth every 4 (four) hours as needed.    Dispense:  120 mL    Refill:  0    Order Specific Question:  Supervising Provider    Answer:  [name] [[id number]]  . Fluticasone Furoate-Vilanterol (BREO ELLIPTA) 100-25 MCG/INH AEPB    Sig: 1 puff qd as directed    Dispense:  1 each    Refill:  0    Order Specific Question:  Supervising Provider    Answer:  [name] [[id number]]   Chest xray pending. Stop Lisinopril; switch to Losartan. Add Breo to regimen for the next several weeks. Call back in 2 weeks if cough persists, sooner if worse.

## 2015-12-18 ENCOUNTER — Emergency Department (HOSPITAL_COMMUNITY): Payer: BLUE CROSS/BLUE SHIELD

## 2015-12-18 ENCOUNTER — Emergency Department (HOSPITAL_COMMUNITY)
Admission: EM | Admit: 2015-12-18 | Discharge: 2015-12-18 | Disposition: A | Discharge status: Home / Self Care | Payer: BLUE CROSS/BLUE SHIELD | Attending: Emergency Medicine | Admitting: Emergency Medicine

## 2015-12-18 ENCOUNTER — Encounter (HOSPITAL_COMMUNITY): Payer: Self-pay

## 2015-12-18 DIAGNOSIS — Z85820 Personal history of malignant melanoma of skin: Secondary | ICD-10-CM | POA: Diagnosis not present

## 2015-12-18 DIAGNOSIS — N200 Calculus of kidney: Secondary | ICD-10-CM

## 2015-12-18 DIAGNOSIS — F329 Major depressive disorder, single episode, unspecified: Secondary | ICD-10-CM | POA: Insufficient documentation

## 2015-12-18 DIAGNOSIS — Z79899 Other long term (current) drug therapy: Secondary | ICD-10-CM | POA: Diagnosis not present

## 2015-12-18 DIAGNOSIS — Z881 Allergy status to other antibiotic agents status: Secondary | ICD-10-CM | POA: Diagnosis not present

## 2015-12-18 DIAGNOSIS — I10 Essential (primary) hypertension: Secondary | ICD-10-CM | POA: Insufficient documentation

## 2015-12-18 DIAGNOSIS — Z8669 Personal history of other diseases of the nervous system and sense organs: Secondary | ICD-10-CM | POA: Insufficient documentation

## 2015-12-18 DIAGNOSIS — R109 Unspecified abdominal pain: Secondary | ICD-10-CM | POA: Diagnosis present

## 2015-12-18 LAB — COMPREHENSIVE METABOLIC PANEL
ALK PHOS: 91 U/L (ref 38–126)
ALT: 28 U/L (ref 14–54)
ANION GAP: 12 (ref 5–15)
AST: 27 U/L (ref 15–41)
Albumin: 3.9 g/dL (ref 3.5–5.0)
BILIRUBIN TOTAL: 0.5 mg/dL (ref 0.3–1.2)
BUN: 14 mg/dL (ref 6–20)
CO2: 21 mmol/L — AB (ref 22–32)
Calcium: 9.3 mg/dL (ref 8.9–10.3)
Chloride: 110 mmol/L (ref 101–111)
Creatinine, Ser: 0.7 mg/dL (ref 0.44–1.00)
GFR calc non Af Amer: >60 mL/min (ref >60)
GLUCOSE: 136 mg/dL — AB (ref 65–99)
Potassium: 3.5 mmol/L (ref 3.5–5.1)
SODIUM: 143 mmol/L (ref 135–145)
Total Protein: 7 g/dL (ref 6.5–8.1)

## 2015-12-18 LAB — URINE MICROSCOPIC-ADD ON

## 2015-12-18 LAB — CBC WITH DIFFERENTIAL/PLATELET
Basophils Absolute: 0 10*3/uL (ref 0.0–0.1)
Basophils Relative: 0 %
EOS ABS: 0.2 10*3/uL (ref 0.0–0.7)
EOS PCT: 2 %
HCT: 36 % (ref 36.0–46.0)
Hemoglobin: 12.3 g/dL (ref 12.0–15.0)
LYMPHS ABS: 2 10*3/uL (ref 0.7–4.0)
Lymphocytes Relative: 23 %
MCH: 29.6 pg (ref 26.0–34.0)
MCHC: 34.2 g/dL (ref 30.0–36.0)
MCV: 86.5 fL (ref 78.0–100.0)
MONOS PCT: 5 %
Monocytes Absolute: 0.4 10*3/uL (ref 0.1–1.0)
Neutro Abs: 6.4 10*3/uL (ref 1.7–7.7)
Neutrophils Relative %: 70 %
Platelets: 269 10*3/uL (ref 150–400)
RBC: 4.16 MIL/uL (ref 3.87–5.11)
RDW: 13.5 % (ref 11.5–15.5)
WBC: 9 10*3/uL (ref 4.0–10.5)

## 2015-12-18 LAB — URINALYSIS, ROUTINE W REFLEX MICROSCOPIC
Bilirubin Urine: NEGATIVE
Glucose, UA: NEGATIVE mg/dL
Ketones, ur: NEGATIVE mg/dL
Leukocytes, UA: NEGATIVE
Nitrite: NEGATIVE
Protein, ur: NEGATIVE mg/dL
pH: 5.5 (ref 5.0–8.0)

## 2015-12-18 MED ORDER — ONDANSETRON HCL 4 MG/2ML IJ SOLN
4.0000 mg | Freq: Once | INTRAMUSCULAR | Status: AC
  Administered 2015-12-18: 4 mg via INTRAVENOUS
  Filled 2015-12-18: qty 2

## 2015-12-18 MED ORDER — ONDANSETRON 4 MG PO TBDP: 4.0000 mg | ORAL_TABLET | Freq: Three times a day (TID) | ORAL | Status: DC | PRN

## 2015-12-18 MED ORDER — KETOROLAC TROMETHAMINE 30 MG/ML IJ SOLN
30.0000 mg | Freq: Once | INTRAMUSCULAR | Status: AC
  Administered 2015-12-18: 30 mg via INTRAVENOUS
  Filled 2015-12-18: qty 1

## 2015-12-18 MED ORDER — OXYCODONE-ACETAMINOPHEN 5-325 MG PO TABS: 1.0000 | ORAL_TABLET | Freq: Four times a day (QID) | ORAL | Status: DC | PRN

## 2015-12-18 MED ORDER — MORPHINE SULFATE (PF) 4 MG/ML IV SOLN
4.0000 mg | Freq: Once | INTRAVENOUS | Status: AC
  Administered 2015-12-18: 4 mg via INTRAVENOUS
  Filled 2015-12-18: qty 1

## 2015-12-18 MED ORDER — IBUPROFEN 800 MG PO TABS: 800.0000 mg | ORAL_TABLET | Freq: Three times a day (TID) | ORAL | Status: DC | PRN

## 2015-12-18 MED ORDER — TAMSULOSIN HCL 0.4 MG PO CAPS: 0.4000 mg | ORAL_CAPSULE | Freq: Every day | ORAL | Status: DC

## 2015-12-18 NOTE — ED Provider Notes (Signed)
TIME SEEN: 1:10 AM  CHIEF COMPLAINT: Left flank pain, vomiting  HPI: Pt is a 50 y.o. female with history of hypertension, migraines, melanoma who presents to the emergency department with sudden onset left lower back pain that sharp in nature, severe that started while at rest tonight. Caused her to have nausea and vomiting. No diarrhea. No fevers, chills, dysuria, hematuria, vaginal bleeding or discharge. She is status post hysterectomy. No history of injury to the back. No fevers. No numbness, tingling or focal weakness. No bowel or bladder incontinence. Pain is worse with movement but not reproducible with palpation. No alleviating factors. Pain waxes and wanes. She has never had similar symptoms. No history of kidney stones or kidney infection.  ROS: See HPI Constitutional: no fever  Eyes: no drainage  ENT: no runny nose   Cardiovascular:  no chest pain  Resp: no SOB  GI: no vomiting GU: no dysuria Integumentary: no rash  Allergy: no hives  Musculoskeletal: no leg swelling  Neurological: no slurred speech ROS otherwise negative  PAST MEDICAL HISTORY/PAST SURGICAL HISTORY:  Past Medical History  Diagnosis Date  . Hypertension   . Depression   . Melanoma ([place])   . Migraines   . Complication of anesthesia     MEDICATIONS:  Prior to Admission medications   Medication Sig Start Date End Date Taking? Authorizing Provider  acetaminophen (TYLENOL) 500 MG tablet Take 500 mg by mouth every 6 (six) hours as needed.   Yes Historical Provider, MD  citalopram (CELEXA) 20 MG tablet Take 20 mg by mouth every morning.   Yes Historical Provider, MD  Cyanocobalamin (VITAMIN B-12 PO) Take 1 tablet by mouth daily.   Yes Historical Provider, MD  doxycycline (VIBRAMYCIN) 100 MG capsule Take 100 mg by mouth 2 (two) times daily.   Yes Historical Provider, MD  Eluxadoline (VIBERZI) 100 MG TABS Take 1 tablet by mouth 2 (two) times daily. 09/10/15  Yes Carlis Stable, NP  losartan (COZAAR) 100 MG tablet  Take 1 tablet (100 mg total) by mouth daily. 10/10/15  Yes Nilda Simmer, NP  Multiple Vitamin (MULTIVITAMIN) tablet Take 1 tablet by mouth daily.   Yes Historical Provider, MD  rizatriptan (MAXALT-MLT) 10 MG disintegrating tablet Take 1 tablet (10 mg total) by mouth as needed for migraine. May repeat in 2 hours if needed. Max 2 per 24 hours 05/23/15  Yes [name], MD  topiramate (TOPAMAX) 50 MG tablet TAKE ONE TABLET BY MOUTH TWICE DAILY 09/05/15  Yes [name], MD  azithromycin (ZITHROMAX Z-PAK) 250 MG tablet Take 2 tablets (500 mg) on  Day 1,  followed by 1 tablet (250 mg) once daily on Days 2 through 5. 10/10/15   Nilda Simmer, NP  benzonatate (TESSALON) 100 MG capsule Take 1 capsule (100 mg total) by mouth 3 (three) times daily as needed for cough. 10/10/15   Nilda Simmer, NP  Fluticasone Furoate-Vilanterol (BREO ELLIPTA) 100-25 MCG/INH AEPB 1 puff qd as directed 10/10/15   Nilda Simmer, NP  HYDROcodone-homatropine [facility]) 5-1.5 MG/5ML syrup Take 5 mLs by mouth every 4 (four) hours as needed. 10/10/15   Nilda Simmer, NP  predniSONE (DELTASONE) 20 MG tablet 3 po qd x 3 d then 2 po qd x 3 d then 1 po qd x 3 d 10/10/15   Nilda Simmer, NP  Pyridoxine HCl (VITAMIN B6 PO) Take 1 tablet by mouth daily.    Historical Provider, MD  verapamil (CALAN-SR) 180 MG CR  tablet TAKE ONE TABLET BY MOUTH ONCE DAILY 10/14/15   [name], MD    ALLERGIES:  Allergies  Allergen Reactions  . Augmentin [Amoxicillin-Pot Clavulanate]     Gi upset  . Sulfa Antibiotics Other (See Comments)    Burning sensation, flushing to skin    SOCIAL HISTORY:  Social History  Substance Use Topics  . Smoking status: Never Smoker   . Smokeless tobacco: Not on file  . Alcohol Use: Yes     Comment: occassionally    FAMILY HISTORY: Family History  Problem Relation Age of Onset  . Diabetes Mother   . Colon cancer Neg Hx     EXAM: BP 141/82 mmHg  Pulse 72  Temp(Src)  98.3 F (36.8 C) (Oral)  Resp 18  Ht 5' 3.5" (1.613 m)  Wt 169 lb (76.658 kg)  BMI 29.46 kg/m2  SpO2 98%  LMP 10/26/2014 CONSTITUTIONAL: Alert and oriented and responds appropriately to questions. Well-appearing; well-nourished HEAD: Normocephalic EYES: Conjunctivae clear, PERRL ENT: normal nose; no rhinorrhea; moist mucous membranes; pharynx without lesions noted NECK: Supple, no meningismus, no LAD  CARD: RRR; S1 and S2 appreciated; no murmurs, no clicks, no rubs, no gallops RESP: Normal chest excursion without splinting or tachypnea; breath sounds clear and equal bilaterally; no wheezes, no rhonchi, no rales, no hypoxia or respiratory distress, speaking full sentences ABD/GI: Normal bowel sounds; non-distended; soft, non-tender, no rebound, no guarding, no peritoneal signs BACK:  The back appears normal and is non-tender to palpation, there is no CVA tenderness, no midline spinal tenderness or step-off or deformity, point the left lower lumbar area as the source of her pain but no tenderness over the spinal musculature in this area; patient does have a 3 x 5 cm area on her upper back that has been excised with a small surrounding area of erythema and healing tissue that is bleeding minimally with no drainage otherwise EXT: Normal ROM in all joints; non-tender to palpation; no edema; normal capillary refill; no cyanosis, no calf tenderness or swelling    SKIN: Normal color for age and race; warm NEURO: Moves all extremities equally, sensation to light touch intact diffusely, cranial nerves II through XII intact PSYCH: The patient's mood and manner are appropriate. Grooming and personal hygiene are appropriate.  MEDICAL DECISION MAKING: Patient here with what appears to be possible kidney stone. We'll obtain labs, urine and CT of her abdomen and pelvis. She does have an area where she had a recent melanoma excised but does not does not appear infected and she is currently on doxycycline. This  is not where her pain is located. Doubt cauda equina, spinal stenosis, transverse myelitis or discitis, epidural abscess or hematoma. Neurologically intact. No history of back injury. We'll give IV Zofran, Toradol and morphine and reassess.  ED PROGRESS: Patient's labs unremarkable. Urine shows large blood, calcium oxalate crystals and many bacteria. Urine culture is pending given many bacteria. CT scan shows mild to moderate right-sided hydronephrosis with an obstructing 6 x 5 mm stone at the proximal right ureter just below the right renal pelvis. She also has scattered nonobstructing renal stones on the left side. She has a 3 cm hyperdensity in the right hepatic lobe compatible with hemangioma that was similar to a CT scan in 2015.  Patient reports her pain and nausea have been well controlled with Toradol, morphine and Zofran. I feel patient is safe to be discharged home with close outpatient urology follow-up given she may need intervention for this  obstructing stone although it is 6 x 5 mm and may pass on its own. Have recommended increasing her fluid intake. We'll discharge with ibuprofen, Percocet, Zofran, Flomax. We'll give urology follow-up information and a urine strainer. She's been able to drink in the emergency department without vomiting. Discussed strict return precautions. She verbalized understanding and is comfortable with this plan.     Williams, DO 12/18/15 [phone]

## 2015-12-18 NOTE — Discharge Instructions (Signed)
Dietary Guidelines to Help Prevent Kidney Stones °Your risk of kidney stones can be decreased by adjusting the foods you eat. The most important thing you can do is drink enough fluid. You should drink enough fluid to keep your urine clear or pale yellow. The following guidelines provide specific information for the type of kidney stone you have had. °GUIDELINES ACCORDING TO TYPE OF KIDNEY STONE °Calcium Oxalate Kidney Stones °· Reduce the amount of salt you eat. Foods that have a lot of salt cause your body to release excess calcium into your urine. The excess calcium can combine with a substance called oxalate to form kidney stones. °· Reduce the amount of animal protein you eat if the amount you eat is excessive. Animal protein causes your body to release excess calcium into your urine. Ask your dietitian how much protein from animal sources you should be eating. °· Avoid foods that are high in oxalates. If you take vitamins, they should have less than 500 mg of vitamin C. Your body turns vitamin C into oxalates. You do not need to avoid fruits and vegetables high in vitamin C. °Calcium Phosphate Kidney Stones °· Reduce the amount of salt you eat to help prevent the release of excess calcium into your urine. °· Reduce the amount of animal protein you eat if the amount you eat is excessive. Animal protein causes your body to release excess calcium into your urine. Ask your dietitian how much protein from animal sources you should be eating. °· Get enough calcium from food or take a calcium supplement (ask your dietitian for recommendations). Food sources of calcium that do not increase your risk of kidney stones include: °· Broccoli. °· Dairy products, such as cheese and yogurt. °· Pudding. °Uric Acid Kidney Stones °· Do not have more than 6 oz of animal protein per day. °FOOD SOURCES °Animal Protein Sources °· Meat (all types). °· Poultry. °· Eggs. °· Fish, seafood. °Foods High in Salt °· Salt seasonings. °· Soy  sauce. °· Teriyaki sauce. °· Cured and processed meats. °· Salted crackers and snack foods. °· Fast food. °· Canned soups and most canned foods. °Foods High in Oxalates °· Grains: °· Amaranth. °· Barley. °· Grits. °· Wheat germ. °· Bran. °· Buckwheat flour. °· All bran cereals. °· Pretzels. °· Whole wheat bread. °· Vegetables: °· Beans (wax). °· Beets and beet greens. °· Collard greens. °· Eggplant. °· Escarole. °· Leeks. °· Okra. °· Parsley. °· Rutabagas. °· Spinach. °· Swiss chard. °· Tomato paste. °· Fried potatoes. °· Sweet potatoes. °· Fruits: °· Red currants. °· Figs. °· Kiwi. °· Rhubarb. °· Meat and Other Protein Sources: °· Beans (dried). °· Soy burgers and other soybean products. °· Miso. °· Nuts (peanuts, almonds, pecans, cashews, hazelnuts). °· Nut butters. °· Sesame seeds and tahini (paste made of sesame seeds). °· Poppy seeds. °· Beverages: °· Chocolate drink mixes. °· Soy milk. °· Instant iced tea. °· Juices made from high-oxalate fruits or vegetables. °· Other: °· Carob. °· Chocolate. °· Fruitcake. °· Marmalades. °  °This information is not intended to replace advice given to you by your health care provider. Make sure you discuss any questions you have with your health care provider. °  °Document Released: 02/05/2011 Document Revised: 10/16/2013 Document Reviewed: 09/07/2013 °Elsevier Interactive Patient Education ©2016 [employer] ° °Kidney Stones °Kidney stones (urolithiasis) are deposits that form inside your kidneys. The intense pain is caused by the stone moving through the urinary tract. When the stone moves, the ureter   goes into spasm around the stone. The stone is usually passed in the urine.  °CAUSES  °· A disorder that makes certain neck glands produce too much parathyroid hormone (primary hyperparathyroidism). °· A buildup of uric acid crystals, similar to gout in your joints. °· Narrowing (stricture) of the ureter. °· A kidney obstruction present at birth (congenital  obstruction). °· Previous surgery on the kidney or ureters. °· Numerous kidney infections. °SYMPTOMS  °· Feeling sick to your stomach (nauseous). °· Throwing up (vomiting). °· Blood in the urine (hematuria). °· Pain that usually spreads (radiates) to the groin. °· Frequency or urgency of urination. °DIAGNOSIS  °· Taking a history and physical exam. °· Blood or urine tests. °· CT scan. °· Occasionally, an examination of the inside of the urinary bladder (cystoscopy) is performed. °TREATMENT  °· Observation. °· Increasing your fluid intake. °· Extracorporeal shock wave lithotripsy--This is a noninvasive procedure that uses shock waves to break up kidney stones. °· Surgery may be needed if you have severe pain or persistent obstruction. There are various surgical procedures. Most of the procedures are performed with the use of small instruments. Only small incisions are needed to accommodate these instruments, so recovery time is minimized. °The size, location, and chemical composition are all important variables that will determine the proper choice of action for you. Talk to your health care provider to better understand your situation so that you will minimize the risk of injury to yourself and your kidney.  °HOME CARE INSTRUCTIONS  °· Drink enough water and fluids to keep your urine clear or pale yellow. This will help you to pass the stone or stone fragments. °· Strain all urine through the provided strainer. Keep all particulate matter and stones for your health care provider to see. The stone causing the pain may be as small as a grain of salt. It is very important to use the strainer each and every time you pass your urine. The collection of your stone will allow your health care provider to analyze it and verify that a stone has actually passed. The stone analysis will often identify what you can do to reduce the incidence of recurrences. °· Only take over-the-counter or prescription medicines for pain,  discomfort, or fever as directed by your health care provider. °· Keep all follow-up visits as told by your health care provider. This is important. °· Get follow-up X-rays if required. The absence of pain does not always mean that the stone has passed. It may have only stopped moving. If the urine remains completely obstructed, it can cause loss of kidney function or even complete destruction of the kidney. It is your responsibility to make sure X-rays and follow-ups are completed. Ultrasounds of the kidney can show blockages and the status of the kidney. Ultrasounds are not associated with any radiation and can be performed easily in a matter of minutes. °· Make changes to your daily diet as told by your health care provider. You may be told to: °¨ Limit the amount of salt that you eat. °¨ Eat 5 or more servings of fruits and vegetables each day. °¨ Limit the amount of meat, poultry, fish, and eggs that you eat. °· Collect a 24-hour urine sample as told by your health care provider. You may need to collect another urine sample every 6-12 months. °SEEK MEDICAL CARE IF: °· You experience pain that is progressive and unresponsive to any pain medicine you have been prescribed. °SEEK IMMEDIATE MEDICAL CARE IF:  °· Pain   Pain cannot be controlled with the prescribed medicine. °· You have a fever or shaking chills. °· The severity or intensity of pain increases over 18 hours and is not relieved by pain medicine. °· You develop a new onset of abdominal pain. °· You feel faint or pass out. °· You are unable to urinate. °  °This information is not intended to replace advice given to you by your health care provider. Make sure you discuss any questions you have with your health care provider. °  °Document Released: 10/11/2005 Document Revised: 07/02/2015 Document Reviewed: 03/14/2013 °Elsevier Interactive Patient Education ©2016 [employer] Inc[employer] ° °

## 2015-12-18 NOTE — ED Notes (Signed)
Patient was given a prepackage of Percocet and Zofran quantity six each, given instructions on use, patient verbally understands.

## 2015-12-18 NOTE — ED Notes (Signed)
Pt started having mid back pain this evening with the pain being so bad she has been vomiting.

## 2015-12-19 LAB — URINE CULTURE: Culture: NO GROWTH

## 2015-12-22 ENCOUNTER — Other Ambulatory Visit: Payer: Self-pay | Admitting: Urology

## 2015-12-22 ENCOUNTER — Ambulatory Visit (HOSPITAL_COMMUNITY)
Admission: RE | Admit: 2015-12-22 | Discharge: 2015-12-22 | Disposition: A | Discharge status: Home / Self Care | Payer: BLUE CROSS/BLUE SHIELD | Source: Ambulatory Visit | Attending: Urology | Admitting: Urology

## 2015-12-22 ENCOUNTER — Encounter (HOSPITAL_COMMUNITY)
Admission: AD | Disposition: A | Discharge status: Home / Self Care | Payer: Self-pay | Source: Ambulatory Visit | Attending: Urology

## 2015-12-22 ENCOUNTER — Encounter (HOSPITAL_COMMUNITY): Payer: Self-pay | Admitting: *Deleted

## 2015-12-22 ENCOUNTER — Ambulatory Visit (HOSPITAL_COMMUNITY): Payer: BLUE CROSS/BLUE SHIELD | Admitting: Anesthesiology

## 2015-12-22 DIAGNOSIS — Z79899 Other long term (current) drug therapy: Secondary | ICD-10-CM | POA: Diagnosis not present

## 2015-12-22 DIAGNOSIS — N202 Calculus of kidney with calculus of ureter: Secondary | ICD-10-CM | POA: Insufficient documentation

## 2015-12-22 DIAGNOSIS — Z90710 Acquired absence of both cervix and uterus: Secondary | ICD-10-CM | POA: Diagnosis not present

## 2015-12-22 DIAGNOSIS — I10 Essential (primary) hypertension: Secondary | ICD-10-CM | POA: Insufficient documentation

## 2015-12-22 HISTORY — PX: CYSTOSCOPY W/ URETERAL STENT PLACEMENT: SHX1429

## 2015-12-22 SURGERY — CYSTOSCOPY, WITH RETROGRADE PYELOGRAM AND URETERAL STENT INSERTION
Anesthesia: General | Site: Ureter | Laterality: Right

## 2015-12-22 MED ORDER — SODIUM CHLORIDE 0.9 % IR SOLN
Status: DC | PRN
  Administered 2015-12-22: 3000 mL

## 2015-12-22 MED ORDER — MIDAZOLAM HCL 5 MG/5ML IJ SOLN
INTRAMUSCULAR | Status: DC | PRN
  Administered 2015-12-22: 2 mg via INTRAVENOUS

## 2015-12-22 MED ORDER — PHENYLEPHRINE HCL 10 MG/ML IJ SOLN
INTRAMUSCULAR | Status: DC | PRN
  Administered 2015-12-22: 80 ug via INTRAVENOUS

## 2015-12-22 MED ORDER — FENTANYL CITRATE (PF) 100 MCG/2ML IJ SOLN
INTRAMUSCULAR | Status: AC
Start: 2015-12-22 — End: 2015-12-22
  Filled 2015-12-22: qty 2

## 2015-12-22 MED ORDER — OXYCODONE-ACETAMINOPHEN 5-325 MG PO TABS: 1.0000 | ORAL_TABLET | Freq: Four times a day (QID) | ORAL | Status: DC | PRN

## 2015-12-22 MED ORDER — ONDANSETRON HCL 4 MG/2ML IJ SOLN
INTRAMUSCULAR | Status: AC
  Filled 2015-12-22: qty 2

## 2015-12-22 MED ORDER — ONDANSETRON HCL 4 MG/2ML IJ SOLN
INTRAMUSCULAR | Status: DC | PRN
  Administered 2015-12-22: 4 mg via INTRAVENOUS

## 2015-12-22 MED ORDER — DEXAMETHASONE SODIUM PHOSPHATE 10 MG/ML IJ SOLN
INTRAMUSCULAR | Status: AC
  Filled 2015-12-22: qty 1

## 2015-12-22 MED ORDER — MIDAZOLAM HCL 2 MG/2ML IJ SOLN
INTRAMUSCULAR | Status: AC
  Filled 2015-12-22: qty 2

## 2015-12-22 MED ORDER — PHENYLEPHRINE 40 MCG/ML (10ML) SYRINGE FOR IV PUSH (FOR BLOOD PRESSURE SUPPORT)
PREFILLED_SYRINGE | INTRAVENOUS | Status: AC
  Filled 2015-12-22: qty 10

## 2015-12-22 MED ORDER — LACTATED RINGERS IV SOLN: INTRAVENOUS | Status: DC

## 2015-12-22 MED ORDER — IOHEXOL 300 MG/ML  SOLN
INTRAMUSCULAR | Status: DC | PRN
  Administered 2015-12-22: 15 mL

## 2015-12-22 MED ORDER — PROPOFOL 10 MG/ML IV BOLUS
INTRAVENOUS | Status: AC
  Filled 2015-12-22: qty 20

## 2015-12-22 MED ORDER — CIPROFLOXACIN HCL 500 MG PO TABS: 500.0000 mg | ORAL_TABLET | Freq: Two times a day (BID) | ORAL | Status: DC

## 2015-12-22 MED ORDER — DEXAMETHASONE SODIUM PHOSPHATE 10 MG/ML IJ SOLN
INTRAMUSCULAR | Status: DC | PRN
  Administered 2015-12-22: 10 mg via INTRAVENOUS

## 2015-12-22 MED ORDER — CEFAZOLIN SODIUM-DEXTROSE 2-3 GM-% IV SOLR
INTRAVENOUS | Status: AC
  Filled 2015-12-22: qty 50

## 2015-12-22 MED ORDER — CEFAZOLIN SODIUM-DEXTROSE 2-3 GM-% IV SOLR
2.0000 g | INTRAVENOUS | Status: AC
  Administered 2015-12-22: 2 g via INTRAVENOUS

## 2015-12-22 MED ORDER — FENTANYL CITRATE (PF) 100 MCG/2ML IJ SOLN
INTRAMUSCULAR | Status: DC | PRN
  Administered 2015-12-22: 25 ug via INTRAVENOUS
  Administered 2015-12-22: 50 ug via INTRAVENOUS
  Administered 2015-12-22: 25 ug via INTRAVENOUS

## 2015-12-22 MED ORDER — PHENYLEPHRINE 40 MCG/ML (10ML) SYRINGE FOR IV PUSH (FOR BLOOD PRESSURE SUPPORT)
PREFILLED_SYRINGE | INTRAVENOUS | Status: AC
  Filled 2015-12-22: qty 30

## 2015-12-22 MED ORDER — LIDOCAINE HCL (CARDIAC) 20 MG/ML IV SOLN
INTRAVENOUS | Status: DC | PRN
  Administered 2015-12-22: 100 mg via INTRAVENOUS

## 2015-12-22 MED ORDER — LACTATED RINGERS IV SOLN
INTRAVENOUS | Status: DC | PRN
  Administered 2015-12-22 (×2): via INTRAVENOUS

## 2015-12-22 MED ORDER — FENTANYL CITRATE (PF) 100 MCG/2ML IJ SOLN: 25.0000 ug | INTRAMUSCULAR | Status: DC | PRN

## 2015-12-22 SURGICAL SUPPLY — 12 items
BAG URO CATCHER STRL LF (MISCELLANEOUS) ×3 IMPLANT
CATH INTERMIT  6FR 70CM (CATHETERS) ×2 IMPLANT
CLOTH BEACON ORANGE TIMEOUT ST (SAFETY) ×3 IMPLANT
GLOVE BIOGEL M STRL SZ7.5 (GLOVE) ×3 IMPLANT
GOWN STRL REUS W/TWL LRG LVL3 (GOWN DISPOSABLE) ×6 IMPLANT
GUIDEWIRE ANG ZIPWIRE 038X150 (WIRE) IMPLANT
GUIDEWIRE STR DUAL SENSOR (WIRE) ×2 IMPLANT
MANIFOLD NEPTUNE II (INSTRUMENTS) ×3 IMPLANT
PACK CYSTO (CUSTOM PROCEDURE TRAY) ×3 IMPLANT
STENT URET 6FRX24 CONTOUR (STENTS) ×2 IMPLANT
TUBING CONNECTING 10 (TUBING) ×1 IMPLANT
TUBING CONNECTING 10' (TUBING) ×1

## 2015-12-22 NOTE — H&P (Signed)
Chief Complaint Nephrolithiasis    Referring provider: Pearson Forster   History of Present Illness The patient is a 50 year old female who presents with a 5 mm proximal right ureteral stone. She also has a nonobstructing left 7 mm renal stone. Both stones seemed to be visible on KUB at the level of L3. The pain has become unbearable over the last 24 hours. The symptoms first started approximately 5 days ago. She is unable to keep food on this point. She has constant vomiting. She does not eat anything today. She has not had fevers or chills.   Past Medical History Problems  1. History of Anxiety (F41.9) 2. History of depression (Z86.59) 3. History of hypertension (Z86.79)  Surgical History Problems  1. History of Hand Surgery 2. History of Hysterectomy  Current Meds 1. Losartan Potassium 100 MG Oral Tablet;  Therapy: (Recorded:27Feb2017) to Recorded 2. Maxalt 10 MG Oral Tablet (Rizatriptan Benzoate);  Therapy: (Recorded:27Feb2017) to Recorded 3. Multi-Day Vitamins TABS;  Therapy: (Recorded:27Feb2017) to Recorded 4. Ondansetron 4 MG Oral Tablet Dispersible;  Therapy: (Recorded:27Feb2017) to Recorded 5. Oxycodone-Acetaminophen 5-325 MG Oral Tablet;  Therapy: (Recorded:27Feb2017) to Recorded 6. Tamsulosin HCl - 0.4 MG Oral Capsule;  Therapy: (Recorded:27Feb2017) to Recorded 7. Topiramate TABS;  Therapy: (Recorded:27Feb2017) to Recorded 8. Verapamil HCl ER 180 MG Oral Capsule Extended Release 24 Hour;  Therapy: (Recorded:27Feb2017) to Recorded 9. Vitamin B-12 TABS;  Therapy: (Recorded:27Feb2017) to Recorded  Allergies Medication  1. Sulfa Drugs  Family History Problems  1. Family history of cardiac disorder (Z82.49) : Father 2. Family history of diabetes mellitus (Z83.3) : Mother  Social History Problems    Alcohol use (Z78.9)   Denied: History of Caffeine use   Father deceased   Mother alive and healthy   Never a smoker   Number of children    Occupation  Review of Systems Genitourinary, constitutional, skin, eye, otolaryngeal, hematologic/lymphatic, cardiovascular, pulmonary, endocrine, musculoskeletal, gastrointestinal, neurological and psychiatric system(s) were reviewed and pertinent findings if present are noted and are otherwise negative.  Genitourinary: urinary frequency and nocturia.  Gastrointestinal: nausea and vomiting.  Constitutional: feeling tired (fatigue).  Musculoskeletal: back pain.  Neurological: dizziness and headache.  Psychiatric: depression and anxiety.    Vitals Vital Signs [Data Includes: Last 1 Day]  Recorded: 27Feb2017 11:30AM  Height: 5 ft 3.5 in Weight: 166 lb  BMI Calculated: 28.94 BSA Calculated: 1.8 Blood Pressure: 162 / 93 Temperature: 99 F Heart Rate: 71  Physical Exam Constitutional: Well nourished . No acute distress.   ENT:. The ears and nose are normal in appearance.   Neck: The appearance of the neck is normal.   Pulmonary: No respiratory distress.   Cardiovascular:. No peripheral edema.   Abdomen: Right CVA tenderness.   Skin: Normal skin turgor and no visible rash.   Neuro/Psych:. Mood and affect are appropriate. No focal sensory deficits.    Results/Data Urine [Data Includes: Last 1 Day]   [id number]  COLOR YELLOW   APPEARANCE CLEAR   SPECIFIC GRAVITY 1.020   pH 6.0   GLUCOSE NEGATIVE   BILIRUBIN NEGATIVE   KETONE NEGATIVE   BLOOD 3+   PROTEIN NEGATIVE   NITRITE NEGATIVE   LEUKOCYTE ESTERASE NEGATIVE   SQUAMOUS EPITHELIAL/HPF 0-5 HPF  WBC 0-5 WBC/HPF  RBC >60 RBC/HPF  BACTERIA NONE SEEN HPF  CRYSTALS NONE SEEN HPF  CASTS NONE SEEN LPF  Yeast NONE SEEN HPF   Study Result     CLINICAL DATA: Acute onset of mid back pain and vomiting. Initial encounter.  EXAM: CT ABDOMEN AND PELVIS WITHOUT CONTRAST  TECHNIQUE: Multidetector CT imaging of the abdomen and pelvis was performed following the standard protocol without IV contrast.  COMPARISON: CT of the abdomen  and pelvis from 08/19/2014  FINDINGS: The visualized lung bases are clear.  A 3.0 cm hypodensity is noted at the right hepatic lobe. The liver and spleen are otherwise unremarkable. The gallbladder is within normal limits. The pancreas and adrenal glands are unremarkable.  There is mild-to-moderate right-sided hydronephrosis, with an obstructing 6 x 5 mm stone noted at the proximal right ureter, just below the right renal pelvis. Scattered nonobstructing left renal stones are noted, measuring up to 7 mm in size.  No free fluid is identified. The small bowel is unremarkable in appearance. The stomach is within normal limits. No acute vascular abnormalities are seen.  The appendix is normal in caliber, without evidence of appendicitis. The colon is unremarkable in appearance.  The bladder is decompressed and unremarkable in appearance. The patient is status post hysterectomy. No suspicious adnexal masses are seen. No inguinal lymphadenopathy is seen.  No acute osseous abnormalities are identified.  IMPRESSION: 1. Mild to moderate right-sided hydronephrosis, with an obstructing 6 x 5 mm stone at the proximal right ureter, just below the right renal pelvis. 2. Scattered nonobstructing left renal stones, measuring up to 7 mm in size. 3. 3.0 cm hypodensity within the right hepatic lobe is compatible with a hemangioma, on prior CT from 2015.      Assessment The patient is incredibly uncomfortable at this point, so I do not think she is a candidate for medical expulsive therapy. We'll schedule her today for cystoscopy with right ureteral stent placement. She understands the goal of this procedure is to relieve the obstruction across the sternal R date. She is agreeable to proceeding with this. We'll schedule her for this as soon as possible today.   Plan Health Maintenance  1. UA With REFLEX; [Do Not Release]; Status:Complete;   [id number] 11:10AM  1. Right ureteral stone  -Cystoscopy, right retrograde  pyelogram, right ureteral stent placement.    2. Left nonobstructing renal stone.     3. Microscopic hematuria  -Will ensure resolution after treating about.   Amendment CC: Pearson Forster   Signatures Electronically signed by : Baruch Gouty, M.D.; Dec 22 2015 12:02PM EST

## 2015-12-22 NOTE — Transfer of Care (Signed)
Immediate Anesthesia Transfer of Care Note  Patient: Courtney Hale  Procedure(s) Performed: Procedure(s): CYSTOSCOPY WITH RETROGRADE PYELOGRAM/URETERAL STENT PLACEMENT (Right)  Patient Location: PACU  Anesthesia Type:General  Level of Consciousness:  sedated, patient cooperative and responds to stimulation  Airway & Oxygen Therapy:Patient Spontanous Breathing and Patient connected to face mask oxgen  Post-op Assessment:  Report given to PACU RN and Post -op Vital signs reviewed and stable  Post vital signs:  Reviewed and stable  Last Vitals:  Filed Vitals:   12/22/15 1603 12/22/15 1815  BP:  119/77  Pulse:  100  Temp: 37.6 C   Resp:  17    Complications: No apparent anesthesia complications

## 2015-12-22 NOTE — Op Note (Signed)
Date of procedure: 12/22/2015  Preoperative diagnosis:  1. Right ureteral stone 2. Left nonobstructing renal stone   Postoperative diagnosis:  1. Right ureteral stone 2. Left nonobstructing renal stone   Procedure: 1. Cystoscopy 2. Right retrograde pyelogram with interpretation 3. Right ureteral stent placement 6 French by 24 cm  Surgeon: Baruch Gouty, MD  Anesthesia: General  Complications: None  Intraoperative findings: Intraoperative retrograde pyelogram on the right showed hydroureteronephrosis. Filling defect was not appreciated but likely present. Fluoroscopy confirmed correct stent placement at the end of the case on the right side.  EBL: None  Specimens: None  Drains: 6 French by 24 cm right double-J ureteral stent  Disposition: Stable to the postanesthesia care unit  Indication for procedure: The patient is a 50 y.o. female with intractable nausea, vomiting, and pain secondary to right ureteral stone and presents today for right ureteral stent placement.  After reviewing the management options for treatment, the patient elected to proceed with the above surgical procedure(s). We have discussed the potential benefits and risks of the procedure, side effects of the proposed treatment, the likelihood of the patient achieving the goals of the procedure, and any potential problems that might occur during the procedure or recuperation. Informed consent has been obtained.  Description of procedure: The patient was met in the preoperative area. All risks, benefits, and indications of the procedure were described in great detail. The patient consented to the procedure. Preoperative antibiotics were given. The patient was taken to the operative theater. General anesthesia was induced per the anesthesia service. The patient was then placed in the dorsal lithotomy position and prepped and draped in the usual sterile fashion. A preoperative timeout was called. A 21 French 30  cystoscope was inserted into the patient's bladder per urethra atraumatically. The right ureteral orifice was visualized in a retrograde polygrams obtained. This showed hydroureteronephrosis with no obvious filling defect of the stone was likely still present due to the significant hydronephrosis. She did have a narrow right ureteral orifice decision was made at this point just to leave a ureteral stent. A sensor wire was then advanced level of the right renal pelvis under fluoroscopy. A 6 French by 24 cm double-J ureteral stent was placed over the sensor wire. The sensor wire was removed. The stent was confirmed to be in the correct position across in the patient's right renal pelvis on fluoroscopy and a curl in the urinary bladder under direct position. There is significant drainage of clear yellow urine from the stent at this point. The patient was awoken from anesthesia and transferred in stable condition to the postanesthesia care unit.   Plan: The patient will follow-up in the near future for right ureteroscopy. She'll likely need left lithotripsy for a lower pole left renal stone in the future.  Baruch Gouty, M.D.

## 2015-12-22 NOTE — Anesthesia Postprocedure Evaluation (Signed)
Anesthesia Post Note  Patient: Courtney Hale  Procedure(s) Performed: Procedure(s) (LRB): CYSTOSCOPY WITH RETROGRADE PYELOGRAM/URETERAL STENT PLACEMENT (Right)  Patient location during evaluation: PACU Anesthesia Type: General Level of consciousness: awake and alert Pain management: pain level controlled Vital Signs Assessment: post-procedure vital signs reviewed and stable Respiratory status: spontaneous breathing, nonlabored ventilation, respiratory function stable and patient connected to nasal cannula oxygen Cardiovascular status: blood pressure returned to baseline and stable Postop Assessment: no signs of nausea or vomiting Anesthetic complications: no    Last Vitals:  Filed Vitals:   12/22/15 1838 12/22/15 1921  BP: 115/69 135/77  Pulse:  72  Temp: 37.1 C 37.2 C  Resp: 16 18    Last Pain:  Filed Vitals:   12/22/15 1923  PainSc: 0-No pain                 Ruari Mudgett L

## 2015-12-22 NOTE — Anesthesia Preprocedure Evaluation (Signed)
Anesthesia Evaluation  Patient identified by MRN, date of birth, ID band Patient awake    Reviewed: Allergy & Precautions, H&P , NPO status , Patient's Chart, lab work & pertinent test results  Airway Mallampati: II  TM Distance: >3 FB Neck ROM: full    Dental no notable dental hx. (+) Dental Advisory Given, Teeth Intact   Pulmonary neg pulmonary ROS,    Pulmonary exam normal breath sounds clear to auscultation       Cardiovascular Exercise Tolerance: Good hypertension, Pt. on medications Normal cardiovascular exam Rhythm:regular Rate:Normal     Neuro/Psych  Headaches, negative neurological ROS  negative psych ROS   GI/Hepatic negative GI ROS, Neg liver ROS,   Endo/Other  negative endocrine ROS  Renal/GU negative Renal ROS  negative genitourinary   Musculoskeletal   Abdominal   Peds  Hematology negative hematology ROS (+)   Anesthesia Other Findings   Reproductive/Obstetrics negative OB ROS                             Anesthesia Physical Anesthesia Plan  ASA: II  Anesthesia Plan: General   Post-op Pain Management:    Induction: Intravenous  Airway Management Planned: LMA  Additional Equipment:   Intra-op Plan:   Post-operative Plan:   Informed Consent: I have reviewed the patients History and Physical, chart, labs and discussed the procedure including the risks, benefits and alternatives for the proposed anesthesia with the patient or authorized representative who has indicated his/her understanding and acceptance.   Dental Advisory Given  Plan Discussed with: CRNA and Surgeon  Anesthesia Plan Comments:         Anesthesia Quick Evaluation

## 2015-12-22 NOTE — Discharge Instructions (Signed)
General Anesthesia, Adult, Care After Refer to this sheet in the next few weeks. These instructions provide you with information on caring for yourself after your procedure. Your health care provider may also give you more specific instructions. Your treatment has been planned according to current medical practices, but problems sometimes occur. Call your health care provider if you have any problems or questions after your procedure. WHAT TO EXPECT AFTER THE PROCEDURE After the procedure, it is typical to experience:  Sleepiness.  Nausea and vomiting. HOME CARE INSTRUCTIONS  For the first 24 hours after general anesthesia:  Have a responsible person with you.  Do not drive a car. If you are alone, do not take public transportation.  Do not drink alcohol.  Do not take medicine that has not been prescribed by your health care provider.  Do not sign important papers or make important decisions.  You may resume a normal diet and activities as directed by your health care provider.  Change bandages (dressings) as directed.  If you have questions or problems that seem related to general anesthesia, call the hospital and ask for the anesthetist or anesthesiologist on call. SEEK MEDICAL CARE IF:  You have nausea and vomiting that continue the day after anesthesia.  You develop a rash. SEEK IMMEDIATE MEDICAL CARE IF:   You have difficulty breathing.  You have chest pain.  You have any allergic problems.   This information is not intended to replace advice given to you by your health care provider. Make sure you discuss any questions you have with your health care provider.   Document Released: 01/17/2001 Document Revised: 11/01/2014 Document Reviewed: 02/09/2012 Elsevier Interactive Patient Education 2016 [employer] Ureteral Stent Implantation, Care After Refer to this sheet in the next few weeks. These instructions provide you with information on caring for yourself  after your procedure. Your health care provider may also give you more specific instructions. Your treatment has been planned according to current medical practices, but problems sometimes occur. Call your health care provider if you have any problems or questions after your procedure. WHAT TO EXPECT AFTER THE PROCEDURE You should be back to normal activity within 48 hours after the procedure. Nausea and vomiting may occur and are commonly the result of anesthesia. It is common to experience sharp pain in the back or lower abdomen and penis with voiding. This is caused by movement of the ends of the stent with the act of urinating.It usually goes away within minutes after you have stopped urinating. HOME CARE INSTRUCTIONS Make sure to drink plenty of fluids. You may have small amounts of bleeding, causing your urine to be red. This is normal. Certain movements may trigger pain or a feeling that you need to urinate. You may be given medicines to prevent infection or bladder spasms. Be sure to take all medicines as directed. Only take over-the-counter or prescription medicines for pain, discomfort, or fever as directed by your health care provider. Do not take aspirin, as this can make bleeding worse. Your stent will be left in until the blockage is resolved. This may take 2 weeks or longer, depending on the reason for stent implantation. You may have an X-ray exam to make sure your ureter is open and that the stent has not moved out of position (migrated). The stent can be removed by your health care provider in the office. Medicines may be given for comfort while the stent is being removed. Be sure to keep all follow-up appointments  so your health care provider can check that you are healing properly. SEEK MEDICAL CARE IF:  You experience increasing pain.  Your pain medicine is not working. SEEK IMMEDIATE MEDICAL CARE IF:  Your urine is dark red or has blood clots.  You are leaking urine  (incontinent).  You have a fever, chills, feeling sick to your stomach (nausea), or vomiting.  Your pain is not relieved by pain medicine.  The end of the stent comes out of the urethra.  You are unable to urinate.   This information is not intended to replace advice given to you by your health care provider. Make sure you discuss any questions you have with your health care provider.   Document Released: 06/13/2013 Document Revised: 10/16/2013 Document Reviewed: 04/25/2015 Elsevier Interactive Patient Education 2016 [place]. Cystoscopy, Care After Refer to this sheet in the next few weeks. These instructions provide you with information on caring for yourself after your procedure. Your caregiver may also give you more specific instructions. Your treatment has been planned according to current medical practices, but problems sometimes occur. Call your caregiver if you have any problems or questions after your procedure. HOME CARE INSTRUCTIONS  Things you can do to ease any discomfort after your procedure include:  Drinking enough water and fluids to keep your urine clear or pale yellow.  Taking a warm bath to relieve any burning feelings. SEEK IMMEDIATE MEDICAL CARE IF:   You have an increase in blood in your urine.  You notice blood clots in your urine.  You have difficulty passing urine.  You have the chills.  You have abdominal pain.  You have a fever or persistent symptoms for more than 2-3 days.  You have a fever and your symptoms suddenly get worse. MAKE SURE YOU:   Understand these instructions.  Will watch your condition.  Will get help right away if you are not doing well or get worse.   This information is not intended to replace advice given to you by your health care provider. Make sure you discuss any questions you have with your health care provider.   Document Released: 04/30/2005 Document Revised: 11/01/2014 Document Reviewed: 04/03/2012 Elsevier  Interactive Patient Education [employer].

## 2015-12-23 ENCOUNTER — Encounter (HOSPITAL_COMMUNITY): Payer: Self-pay | Admitting: Urology

## 2015-12-24 ENCOUNTER — Other Ambulatory Visit: Payer: Self-pay | Admitting: Urology

## 2015-12-26 MED FILL — Oxycodone w/ Acetaminophen Tab 5-325 MG: ORAL | Qty: 6 | Status: AC

## 2015-12-26 MED FILL — Ondansetron HCl Tab 4 MG: ORAL | Qty: 4 | Status: AC

## 2015-12-30 ENCOUNTER — Encounter (HOSPITAL_BASED_OUTPATIENT_CLINIC_OR_DEPARTMENT_OTHER): Payer: Self-pay | Admitting: *Deleted

## 2015-12-30 NOTE — Progress Notes (Signed)
NPO AFTER MN.  ARRIVE AT 0745.  CURRENT LAB RESULTS AND EKG IN CHART AND EPIC.  WILL TAKE AM MEDS W/ SIPS OF WATER DOS AND IF NEEDED TAKE PAIN/ NAUSEA RX.

## 2016-01-05 ENCOUNTER — Ambulatory Visit (HOSPITAL_BASED_OUTPATIENT_CLINIC_OR_DEPARTMENT_OTHER): Payer: BLUE CROSS/BLUE SHIELD | Admitting: Anesthesiology

## 2016-01-05 ENCOUNTER — Encounter (HOSPITAL_BASED_OUTPATIENT_CLINIC_OR_DEPARTMENT_OTHER): Payer: Self-pay | Admitting: *Deleted

## 2016-01-05 ENCOUNTER — Encounter (HOSPITAL_BASED_OUTPATIENT_CLINIC_OR_DEPARTMENT_OTHER)
Admission: RE | Disposition: A | Discharge status: Home / Self Care | Payer: Self-pay | Source: Ambulatory Visit | Attending: Urology

## 2016-01-05 ENCOUNTER — Ambulatory Visit (HOSPITAL_BASED_OUTPATIENT_CLINIC_OR_DEPARTMENT_OTHER)
Admission: RE | Admit: 2016-01-05 | Discharge: 2016-01-05 | Disposition: A | Discharge status: Home / Self Care | Payer: BLUE CROSS/BLUE SHIELD | Source: Ambulatory Visit | Attending: Urology | Admitting: Urology

## 2016-01-05 DIAGNOSIS — N200 Calculus of kidney: Secondary | ICD-10-CM

## 2016-01-05 DIAGNOSIS — Z79899 Other long term (current) drug therapy: Secondary | ICD-10-CM | POA: Insufficient documentation

## 2016-01-05 DIAGNOSIS — I10 Essential (primary) hypertension: Secondary | ICD-10-CM | POA: Diagnosis not present

## 2016-01-05 DIAGNOSIS — N132 Hydronephrosis with renal and ureteral calculous obstruction: Secondary | ICD-10-CM | POA: Diagnosis not present

## 2016-01-05 HISTORY — DX: Urgency of urination: R39.15

## 2016-01-05 HISTORY — DX: Presence of spectacles and contact lenses: Z97.3

## 2016-01-05 HISTORY — DX: Other specified postprocedural states: Z85.820

## 2016-01-05 HISTORY — PX: CYSTOSCOPY WITH RETROGRADE PYELOGRAM, URETEROSCOPY AND STENT PLACEMENT: SHX5789

## 2016-01-05 HISTORY — DX: Other specified postprocedural states: Z98.890

## 2016-01-05 HISTORY — DX: Unspecified hemorrhoids: K64.9

## 2016-01-05 HISTORY — DX: Personal history of other diseases of the digestive system: Z87.19

## 2016-01-05 HISTORY — PX: STONE EXTRACTION WITH BASKET: SHX5318

## 2016-01-05 HISTORY — DX: Calculus of ureter: N20.1

## 2016-01-05 HISTORY — DX: Calculus of kidney: N20.0

## 2016-01-05 HISTORY — DX: Other microscopic hematuria: R31.29

## 2016-01-05 SURGERY — CYSTOURETEROSCOPY, WITH RETROGRADE PYELOGRAM AND STENT INSERTION
Anesthesia: General | Site: Renal | Laterality: Right

## 2016-01-05 MED ORDER — OXYCODONE-ACETAMINOPHEN 5-325 MG PO TABS: 1.0000 | ORAL_TABLET | Freq: Four times a day (QID) | ORAL | Status: DC | PRN

## 2016-01-05 MED ORDER — PROPOFOL 500 MG/50ML IV EMUL
INTRAVENOUS | Status: DC | PRN
  Administered 2016-01-05: 200 mL via INTRAVENOUS

## 2016-01-05 MED ORDER — LIDOCAINE HCL (CARDIAC) 20 MG/ML IV SOLN
INTRAVENOUS | Status: AC
  Filled 2016-01-05: qty 5

## 2016-01-05 MED ORDER — MIDAZOLAM HCL 2 MG/2ML IJ SOLN
INTRAMUSCULAR | Status: AC
  Filled 2016-01-05: qty 2

## 2016-01-05 MED ORDER — KETOROLAC TROMETHAMINE 30 MG/ML IJ SOLN
30.0000 mg | Freq: Once | INTRAMUSCULAR | Status: DC | PRN
Start: 2016-01-05 — End: 2016-01-05
  Filled 2016-01-05: qty 1

## 2016-01-05 MED ORDER — ONDANSETRON HCL 4 MG/2ML IJ SOLN
INTRAMUSCULAR | Status: DC | PRN
  Administered 2016-01-05: 4 mg via INTRAVENOUS

## 2016-01-05 MED ORDER — CEFAZOLIN SODIUM-DEXTROSE 2-3 GM-% IV SOLR
2.0000 g | INTRAVENOUS | Status: AC
  Administered 2016-01-05: 2 g via INTRAVENOUS
  Filled 2016-01-05: qty 50

## 2016-01-05 MED ORDER — ONDANSETRON HCL 4 MG/2ML IJ SOLN
INTRAMUSCULAR | Status: AC
Start: 2016-01-05 — End: 2016-01-05
  Filled 2016-01-05: qty 4

## 2016-01-05 MED ORDER — LACTATED RINGERS IV SOLN
INTRAVENOUS | Status: DC
  Administered 2016-01-05: 08:00:00 via INTRAVENOUS
  Filled 2016-01-05: qty 1000

## 2016-01-05 MED ORDER — CEFAZOLIN SODIUM-DEXTROSE 2-3 GM-% IV SOLR
INTRAVENOUS | Status: AC
  Filled 2016-01-05: qty 50

## 2016-01-05 MED ORDER — FENTANYL CITRATE (PF) 100 MCG/2ML IJ SOLN
INTRAMUSCULAR | Status: AC
  Filled 2016-01-05: qty 2

## 2016-01-05 MED ORDER — ONDANSETRON HCL 4 MG/2ML IJ SOLN
INTRAMUSCULAR | Status: AC
  Filled 2016-01-05: qty 2

## 2016-01-05 MED ORDER — OXYCODONE-ACETAMINOPHEN 5-325 MG PO TABS
ORAL_TABLET | ORAL | Status: AC
  Filled 2016-01-05: qty 1

## 2016-01-05 MED ORDER — FENTANYL CITRATE (PF) 100 MCG/2ML IJ SOLN
INTRAMUSCULAR | Status: DC | PRN
  Administered 2016-01-05: 100 ug via INTRAVENOUS

## 2016-01-05 MED ORDER — IOHEXOL 350 MG/ML SOLN
INTRAVENOUS | Status: DC | PRN
  Administered 2016-01-05: 10 mL

## 2016-01-05 MED ORDER — CEPHALEXIN 500 MG PO CAPS
500.0000 mg | ORAL_CAPSULE | Freq: Three times a day (TID) | ORAL | Status: DC
Start: 2016-01-05 — End: 2016-02-12

## 2016-01-05 MED ORDER — SODIUM CHLORIDE 0.9 % IR SOLN
Status: DC | PRN
  Administered 2016-01-05: 4000 mL

## 2016-01-05 MED ORDER — PROMETHAZINE HCL 25 MG/ML IJ SOLN
6.2500 mg | INTRAMUSCULAR | Status: DC | PRN
  Filled 2016-01-05: qty 1

## 2016-01-05 MED ORDER — DEXAMETHASONE SODIUM PHOSPHATE 10 MG/ML IJ SOLN
INTRAMUSCULAR | Status: AC
  Filled 2016-01-05: qty 1

## 2016-01-05 MED ORDER — LIDOCAINE HCL (CARDIAC) 20 MG/ML IV SOLN
INTRAVENOUS | Status: DC | PRN
  Administered 2016-01-05: 70 mg via INTRAVENOUS

## 2016-01-05 MED ORDER — OXYCODONE-ACETAMINOPHEN 5-325 MG PO TABS
1.0000 | ORAL_TABLET | Freq: Once | ORAL | Status: AC
  Administered 2016-01-05: 1 via ORAL
  Filled 2016-01-05: qty 1

## 2016-01-05 MED ORDER — FENTANYL CITRATE (PF) 100 MCG/2ML IJ SOLN
25.0000 ug | INTRAMUSCULAR | Status: DC | PRN
  Filled 2016-01-05: qty 1

## 2016-01-05 MED ORDER — MIDAZOLAM HCL 5 MG/5ML IJ SOLN
INTRAMUSCULAR | Status: DC | PRN
  Administered 2016-01-05: 2 mg via INTRAVENOUS

## 2016-01-05 MED ORDER — DEXAMETHASONE SODIUM PHOSPHATE 10 MG/ML IJ SOLN
INTRAMUSCULAR | Status: DC | PRN
  Administered 2016-01-05: 10 mg via INTRAVENOUS

## 2016-01-05 SURGICAL SUPPLY — 31 items
BAG DRAIN URO-CYSTO SKYTR STRL (DRAIN) ×4 IMPLANT
BAG DRN UROCATH (DRAIN) ×2
BASKET ZERO TIP NITINOL 2.4FR (BASKET) ×3 IMPLANT
BSKT STON RTRVL ZERO TP 2.4FR (BASKET) ×2
CATH URET 5FR 28IN OPEN ENDED (CATHETERS) ×4 IMPLANT
CATH URET DUAL LUMEN 6-10FR 50 (CATHETERS) IMPLANT
CLOTH BEACON ORANGE TIMEOUT ST (SAFETY) ×4 IMPLANT
FIBER LASER FLEXIVA 365 (UROLOGICAL SUPPLIES) IMPLANT
FIBER LASER TRAC TIP (UROLOGICAL SUPPLIES) IMPLANT
GLOVE BIO SURGEON STRL SZ 6.5 (GLOVE) ×2 IMPLANT
GLOVE BIO SURGEON STRL SZ7.5 (GLOVE) ×4 IMPLANT
GLOVE BIO SURGEONS STRL SZ 6.5 (GLOVE) ×1
GLOVE BIOGEL PI IND STRL 6.5 (GLOVE) ×2 IMPLANT
GLOVE BIOGEL PI INDICATOR 6.5 (GLOVE) ×4
GLOVE INDICATOR 6.5 STRL GRN (GLOVE) ×6 IMPLANT
GOWN STRL REUS W/ TWL LRG LVL3 (GOWN DISPOSABLE) ×1 IMPLANT
GOWN STRL REUS W/ TWL XL LVL3 (GOWN DISPOSABLE) ×2 IMPLANT
GOWN STRL REUS W/TWL LRG LVL3 (GOWN DISPOSABLE) ×4
GOWN STRL REUS W/TWL XL LVL3 (GOWN DISPOSABLE) ×4
GUIDEWIRE STR DUAL SENSOR (WIRE) ×4 IMPLANT
IV NS 1000ML (IV SOLUTION) ×4
IV NS 1000ML BAXH (IV SOLUTION) ×1 IMPLANT
IV NS IRRIG 3000ML ARTHROMATIC (IV SOLUTION) ×4 IMPLANT
KIT ROOM TURNOVER WOR (KITS) ×4 IMPLANT
MANIFOLD NEPTUNE II (INSTRUMENTS) ×4 IMPLANT
NS IRRIG 500ML POUR BTL (IV SOLUTION) IMPLANT
PACK CYSTOSCOPY (CUSTOM PROCEDURE TRAY) ×4 IMPLANT
STENT URET 6FRX24 CONTOUR (STENTS) ×3 IMPLANT
SYRINGE IRR TOOMEY STRL 70CC (SYRINGE) ×4 IMPLANT
TUBE CONNECTING 12'X1/4 (SUCTIONS) ×1
TUBE CONNECTING 12X1/4 (SUCTIONS) ×3 IMPLANT

## 2016-01-05 NOTE — Op Note (Signed)
Date of procedure: 01/05/2016  Preoperative diagnosis:  1. Right ureteral stone 2. Left nonobstructing renal stone  Postoperative diagnosis:  1. Right ureteral stone 2. Left nonobstructing renal stone   Procedure: 1. Cystoscopy 2. Right ureteroscopy 3. Stone removal with stone basketing 4. Right retrograde pyelogram with interpretation 5. Right ureteral stent exchange 6 [place] by 24 cm  Surgeon: Baruch Gouty, MD  Anesthesia: General  Complications: None  Intraoperative findings: The patient had a distal right ureteral stone that was grasped with stone basket and removed during ureteroscopy. Pain ureteroscopy was unremarkable for further stones. Retrograde pyelogram on the right side after procedure showed no filling defects to suggest further stone burden.  EBL: None  Specimens: Right ureteral stone office lab  Drains: 6 French by 24 cm right double-J ureteral stent  Disposition: Stable to the postanesthesia care unit  Indication for procedure: The patient is a 50 y.o. female with a right ureteral stone status post right ureteral stent placement for intolerance of medical expulsive therapy presents today for definitive stone management.  After reviewing the management options for treatment, the patient elected to proceed with the above surgical procedure(s). We have discussed the potential benefits and risks of the procedure, side effects of the proposed treatment, the likelihood of the patient achieving the goals of the procedure, and any potential problems that might occur during the procedure or recuperation. Informed consent has been obtained.  Description of procedure: The patient was met in the preoperative area. All risks, benefits, and indications of the procedure were described in great detail. The patient consented to the procedure. Preoperative antibiotics were given. The patient was taken to the operative theater. General anesthesia was induced per the anesthesia  service. The patient was then placed in the dorsal lithotomy position and prepped and draped in the usual sterile fashion. A preoperative timeout was called. A 21 French 30 cystoscope was inserted into the patient's bladder per urethra atraumatically. The right ureteral stent was grasped flexible graspers and brought to level urethral meatus. A sensor wire was exchanged through the stent to the level of the renal pelvis on fluoroscopy and the stent was removed. A some urge ureters was inserted in the patient's bladder into the right ureteral orifice. Distal ureter the stone was encountered. The ureter appeared to be quite patent so an attempt was made to grab the stone basket and remove it. This was done without difficulty. The stone was sent to pathology. Pan ureteroscopy revealed no further stone as expected as originally one stone on the right side and CT scan. A retrograde pyelogram also obtained which again confirmed no further stones. The ureteroscope was removed. Cystoscope was assembled over the sensor wire. A 6 French by 24 cm double-J ureter stent was placed over the sensor wire and sensor wire removed. The stent was confirmed in the correct location with a curl seen in the patient's renal pelvis on fluoroscopy and curl seen in the patient's urinary bladder under direct visualization. The patient's bladder was then drained. The cystoscope was removed. The patient was awoken from anesthesia and transferred in stable condition to the post anesthesia care unit.  Plan: The patient will follow-up in one week for stent removal. She'll need a renal ultrasound in 1 month's are anicteric hydronephrosis. We will discuss with her Topamax this may be a source for stones. She will also need left ESWL in the future.  Baruch Gouty, M.D.

## 2016-01-05 NOTE — Anesthesia Preprocedure Evaluation (Addendum)
Anesthesia Evaluation  Patient identified by MRN, date of birth, ID band Patient awake    Reviewed: Allergy & Precautions, NPO status , Patient's Chart, lab work & pertinent test results  Airway Mallampati: II  TM Distance: >3 FB Neck ROM: Full    Dental no notable dental hx. (+) Teeth Intact, Dental Advisory Given   Pulmonary neg pulmonary ROS,    Pulmonary exam normal breath sounds clear to auscultation       Cardiovascular Exercise Tolerance: Good hypertension, Pt. on medications Normal cardiovascular exam Rhythm:Regular Rate:Normal     Neuro/Psych negative neurological ROS  negative psych ROS   GI/Hepatic negative GI ROS, Neg liver ROS,   Endo/Other  negative endocrine ROS  Renal/GU negative Renal ROS  negative genitourinary   Musculoskeletal negative musculoskeletal ROS (+)   Abdominal   Peds negative pediatric ROS (+)  Hematology negative hematology ROS (+)   Anesthesia Other Findings   Reproductive/Obstetrics negative OB ROS                            Anesthesia Physical Anesthesia Plan  ASA: II  Anesthesia Plan: General   Post-op Pain Management:    Induction: Intravenous  Airway Management Planned: LMA  Additional Equipment:   Intra-op Plan:   Post-operative Plan: Extubation in OR  Informed Consent: I have reviewed the patients History and Physical, chart, labs and discussed the procedure including the risks, benefits and alternatives for the proposed anesthesia with the patient or authorized representative who has indicated his/her understanding and acceptance.   Dental advisory given  Plan Discussed with: CRNA and Surgeon  Anesthesia Plan Comments:         Anesthesia Quick Evaluation

## 2016-01-05 NOTE — H&P (View-Only) (Signed)
Chief Complaint Nephrolithiasis    Referring provider: Pearson Forster   History of Present Illness The patient is a 50 year old female who presents with a 5 mm proximal right ureteral stone. She also has a nonobstructing left 7 mm renal stone. Both stones seemed to be visible on KUB at the level of L3. The pain has become unbearable over the last 24 hours. The symptoms first started approximately 5 days ago. She is unable to keep food on this point. She has constant vomiting. She does not eat anything today. She has not had fevers or chills.   Past Medical History Problems  1. History of Anxiety (F41.9) 2. History of depression (Z86.59) 3. History of hypertension (Z86.79)  Surgical History Problems  1. History of Hand Surgery 2. History of Hysterectomy  Current Meds 1. Losartan Potassium 100 MG Oral Tablet;  Therapy: (Recorded:27Feb2017) to Recorded 2. Maxalt 10 MG Oral Tablet (Rizatriptan Benzoate);  Therapy: (Recorded:27Feb2017) to Recorded 3. Multi-Day Vitamins TABS;  Therapy: (Recorded:27Feb2017) to Recorded 4. Ondansetron 4 MG Oral Tablet Dispersible;  Therapy: (Recorded:27Feb2017) to Recorded 5. Oxycodone-Acetaminophen 5-325 MG Oral Tablet;  Therapy: (Recorded:27Feb2017) to Recorded 6. Tamsulosin HCl - 0.4 MG Oral Capsule;  Therapy: (Recorded:27Feb2017) to Recorded 7. Topiramate TABS;  Therapy: (Recorded:27Feb2017) to Recorded 8. Verapamil HCl ER 180 MG Oral Capsule Extended Release 24 Hour;  Therapy: (Recorded:27Feb2017) to Recorded 9. Vitamin B-12 TABS;  Therapy: (Recorded:27Feb2017) to Recorded  Allergies Medication  1. Sulfa Drugs  Family History Problems  1. Family history of cardiac disorder (Z82.49) : Father 2. Family history of diabetes mellitus (Z83.3) : Mother  Social History Problems    Alcohol use (Z78.9)   Denied: History of Caffeine use   Father deceased   Mother alive and healthy   Never a smoker   Number of children    Occupation  Review of Systems Genitourinary, constitutional, skin, eye, otolaryngeal, hematologic/lymphatic, cardiovascular, pulmonary, endocrine, musculoskeletal, gastrointestinal, neurological and psychiatric system(s) were reviewed and pertinent findings if present are noted and are otherwise negative.  Genitourinary: urinary frequency and nocturia.  Gastrointestinal: nausea and vomiting.  Constitutional: feeling tired (fatigue).  Musculoskeletal: back pain.  Neurological: dizziness and headache.  Psychiatric: depression and anxiety.    Vitals Vital Signs [Data Includes: Last 1 Day]  Recorded: 27Feb2017 11:30AM  Height: 5 ft 3.5 in Weight: 166 lb  BMI Calculated: 28.94 BSA Calculated: 1.8 Blood Pressure: 162 / 93 Temperature: 99 F Heart Rate: 71  Physical Exam Constitutional: Well nourished . No acute distress.   ENT:. The ears and nose are normal in appearance.   Neck: The appearance of the neck is normal.   Pulmonary: No respiratory distress.   Cardiovascular:. No peripheral edema.   Abdomen: Right CVA tenderness.   Skin: Normal skin turgor and no visible rash.   Neuro/Psych:. Mood and affect are appropriate. No focal sensory deficits.    Results/Data Urine [Data Includes: Last 1 Day]   [id number]  COLOR YELLOW   APPEARANCE CLEAR   SPECIFIC GRAVITY 1.020   pH 6.0   GLUCOSE NEGATIVE   BILIRUBIN NEGATIVE   KETONE NEGATIVE   BLOOD 3+   PROTEIN NEGATIVE   NITRITE NEGATIVE   LEUKOCYTE ESTERASE NEGATIVE   SQUAMOUS EPITHELIAL/HPF 0-5 HPF  WBC 0-5 WBC/HPF  RBC >60 RBC/HPF  BACTERIA NONE SEEN HPF  CRYSTALS NONE SEEN HPF  CASTS NONE SEEN LPF  Yeast NONE SEEN HPF   Study Result     CLINICAL DATA: Acute onset of mid back pain and vomiting. Initial encounter.  EXAM: CT ABDOMEN AND PELVIS WITHOUT CONTRAST  TECHNIQUE: Multidetector CT imaging of the abdomen and pelvis was performed following the standard protocol without IV contrast.  COMPARISON: CT of the abdomen  and pelvis from 08/19/2014  FINDINGS: The visualized lung bases are clear.  A 3.0 cm hypodensity is noted at the right hepatic lobe. The liver and spleen are otherwise unremarkable. The gallbladder is within normal limits. The pancreas and adrenal glands are unremarkable.  There is mild-to-moderate right-sided hydronephrosis, with an obstructing 6 x 5 mm stone noted at the proximal right ureter, just below the right renal pelvis. Scattered nonobstructing left renal stones are noted, measuring up to 7 mm in size.  No free fluid is identified. The small bowel is unremarkable in appearance. The stomach is within normal limits. No acute vascular abnormalities are seen.  The appendix is normal in caliber, without evidence of appendicitis. The colon is unremarkable in appearance.  The bladder is decompressed and unremarkable in appearance. The patient is status post hysterectomy. No suspicious adnexal masses are seen. No inguinal lymphadenopathy is seen.  No acute osseous abnormalities are identified.  IMPRESSION: 1. Mild to moderate right-sided hydronephrosis, with an obstructing 6 x 5 mm stone at the proximal right ureter, just below the right renal pelvis. 2. Scattered nonobstructing left renal stones, measuring up to 7 mm in size. 3. 3.0 cm hypodensity within the right hepatic lobe is compatible with a hemangioma, on prior CT from 2015.      Assessment The patient is incredibly uncomfortable at this point, so I do not think she is a candidate for medical expulsive therapy. We'll schedule her today for cystoscopy with right ureteral stent placement. She understands the goal of this procedure is to relieve the obstruction across the sternal R date. She is agreeable to proceeding with this. We'll schedule her for this as soon as possible today.   Plan Health Maintenance  1. UA With REFLEX; [Do Not Release]; Status:Complete;   DoneQE:[id number] 11:10AM  1. Right ureteral stone  -Cystoscopy, right retrograde  pyelogram, right ureteral stent placement.    2. Left nonobstructing renal stone.     3. Microscopic hematuria  -Will ensure resolution after treating about.   Amendment CC: Pearson Forster   Signatures Electronically signed by : Baruch Gouty, M.D.; Dec 22 2015 12:02PM EST

## 2016-01-05 NOTE — Interval H&P Note (Signed)
History and Physical Interval Note:  01/05/2016 8:16 AM  Courtney Hale  has presented today for surgery, with the diagnosis of RIGHT URETERAL STONE  The various methods of treatment have been discussed with the patient and family. After consideration of risks, benefits and other options for treatment, the patient has consented to  Procedure(s): CYSTOSCOPY/URETEROSCOPY/HOLMIUM LASER/STENT REPLACEMENT (Right) HOLMIUM LASER APPLICATION (Right) as a surgical intervention .  The patient's history has been reviewed, patient examined, no change in status, stable for surgery.  I have reviewed the patient's chart and labs.  Questions were answered to the patient's satisfaction.    Patient presents for definitive stone management.  Nickie Retort

## 2016-01-05 NOTE — Anesthesia Postprocedure Evaluation (Signed)
Anesthesia Post Note  Patient: Courtney Hale  Procedure(s) Performed: Procedure(s) (LRB): CYSTOSCOPY WITH RETROGRADE PYELOGRAM, URETEROSCOPY AND STENT REPLACEMENT (Right) STONE EXTRACTION WITH BASKET (Right)  Patient location during evaluation: PACU Anesthesia Type: General Level of consciousness: awake and alert Pain management: pain level controlled Vital Signs Assessment: post-procedure vital signs reviewed and stable Respiratory status: spontaneous breathing, nonlabored ventilation, respiratory function stable and patient connected to nasal cannula oxygen Cardiovascular status: blood pressure returned to baseline and stable Postop Assessment: no signs of nausea or vomiting Anesthetic complications: no    Last Vitals:  Filed Vitals:   01/05/16 1022 01/05/16 1042  BP: 133/86   Pulse: 74 67  Temp: 36.6 C   Resp: 14 13    Last Pain:  Filed Vitals:   01/05/16 1043  PainSc: Asleep                 Fantashia Shupert S

## 2016-01-05 NOTE — Anesthesia Procedure Notes (Signed)
Procedure Name: LMA Insertion Date/Time: 01/05/2016 9:44 AM Performed by: Wanita Chamberlain Pre-anesthesia Checklist: Patient identified, Timeout performed, Emergency Drugs available, Suction available and Patient being monitored Patient Re-evaluated:Patient Re-evaluated prior to inductionOxygen Delivery Method: Circle system utilized Preoxygenation: Pre-oxygenation with 100% oxygen Intubation Type: IV induction Ventilation: Mask ventilation without difficulty LMA: LMA inserted LMA Size: 4.0 Number of attempts: 1 Placement Confirmation: positive ETCO2 and breath sounds checked- equal and bilateral Tube secured with: Tape Dental Injury: Teeth and Oropharynx as per pre-operative assessment

## 2016-01-05 NOTE — Transfer of Care (Signed)
Immediate Anesthesia Transfer of Care Note  Patient: Courtney Hale  Procedure(s) Performed: Procedure(s): CYSTOSCOPY WITH RETROGRADE PYELOGRAM, URETEROSCOPY AND STENT REPLACEMENT (Right) STONE EXTRACTION WITH BASKET (Right)  Patient Location: PACU  Anesthesia Type:General  Level of Consciousness: awake, alert , oriented and patient cooperative  Airway & Oxygen Therapy: Patient Spontanous Breathing and Patient connected to nasal cannula oxygen  Post-op Assessment: Report given to RN and Post -op Vital signs reviewed and stable  Post vital signs: Reviewed and stable  Last Vitals:  Filed Vitals:   01/05/16 0801  BP: 105/71  Pulse: 81  Temp: 37.1 C  Resp: 12    Complications: No apparent anesthesia complications

## 2016-01-05 NOTE — Discharge Instructions (Signed)
[facility] °[phone] °Post Ureteroscopy With or Without Stent Instructions ° °Definitions: ° °Ureter: The duct that transports urine from the kidney to the bladder. °Stent:   A plastic hollow tube that is placed into the ureter, from the kidney to the                 bladder to prevent the ureter from swelling shut. ° °GENERAL INSTRUCTIONS: ° °Despite the fact that no skin incisions were used, the area around the ureter and bladder is raw and irritated. The stent is a foreign body which will further irritate the bladder wall. This irritation is manifested by increased frequency of urination, both day and night, and by an increase in the urge to urinate. In some, the urge to urinate is present almost always. Sometimes the urge is strong enough that you may not be able to stop yourself from urinating. The only real cure is to remove the stent and then give time for the bladder wall to heal which can't be done until the danger of the ureter swelling shut has passed, which varies. ° °You may see some blood in your urine while the stent is in place and a few days afterwards. Do not be alarmed, even if the urine was clear for a while. Get off your feet and drink lots of fluids until clearing occurs. If you start to pass clots or don't improve, call us. ° °DIET: °You may return to your normal diet immediately. Because of the raw surface of your bladder, alcohol, spicy foods, acid type foods and drinks with caffeine may cause irritation or frequency and should be used in moderation. To keep your urine flowing freely and to avoid constipation, drink plenty of fluids during the day ( 8-10 glasses ). °Tip: Avoid cranberry juice because it is very acidic. ° °ACTIVITY: °Your physical activity doesn't need to be restricted. However, if you are very active, you may see some blood in your urine. We suggest that you reduce your activity under these circumstances until the bleeding has stopped. ° °BOWELS: °It is  important to keep your bowels regular during the postoperative period. Straining with bowel movements can cause bleeding. A bowel movement every other day is reasonable. Use a mild laxative if needed, such as Milk of Magnesia 2-3 tablespoons, or 2 Dulcolax tablets. Call if you continue to have problems. If you have been taking narcotics for pain, before, during or after your surgery, you may be constipated. Take a laxative if necessary. ° ° °MEDICATION: °You should resume your pre-surgery medications unless told not to. In addition you will often be given an antibiotic to prevent infection. These should be taken as prescribed until the bottles are finished unless you are having an unusual reaction to one of the drugs. ° °PROBLEMS YOU SHOULD REPORT TO US: °· Fevers over 100.5 Fahrenheit. °· Heavy bleeding, or clots ( See above notes about blood in urine ). °· Inability to urinate. °· Drug reactions ( hives, rash, nausea, vomiting, diarrhea ). °· Severe burning or pain with urination that is not improving. ° °FOLLOW-UP: °You will need a follow-up appointment to monitor your progress. Call for this appointment at the number listed above. Usually the first appointment will be about three to fourteen days after your surgery. ° ° ° ° ° °Post Anesthesia Home Care Instructions ° °Activity: °Get plenty of rest for the remainder of the day. A responsible adult should stay with you for 24 hours following the procedure.  °  For the next 24 hours, DO NOT: -Drive a car -Paediatric nurse -Drink alcoholic beverages -Take any medication unless instructed by your physician -Make any legal decisions or sign important papers.  Meals: Start with liquid foods such as gelatin or soup. Progress to regular foods as tolerated. Avoid greasy, spicy, heavy foods. If nausea and/or vomiting occur, drink only clear liquids until the nausea and/or vomiting subsides. Call your physician if vomiting continues.  Special  Instructions/Symptoms: Your throat may feel dry or sore from the anesthesia or the breathing tube placed in your throat during surgery. If this causes discomfort, gargle with warm salt water. The discomfort should disappear within 24 hours.  If you had a scopolamine patch placed behind your ear for the management of post- operative nausea and/or vomiting:  1. The medication in the patch is effective for 72 hours, after which it should be removed.  Wrap patch in a tissue and discard in the trash. Wash hands thoroughly with soap and water. 2. You may remove the patch earlier than 72 hours if you experience unpleasant side effects which may include dry mouth, dizziness or visual disturbances. 3. Avoid touching the patch. Wash your hands with soap and water after contact with the patch.    Post Anesthesia Home Care Instructions  Activity: Get plenty of rest for the remainder of the day. A responsible adult should stay with you for 24 hours following the procedure.  For the next 24 hours, DO NOT: -Drive a car -Paediatric nurse -Drink alcoholic beverages -Take any medication unless instructed by your physician -Make any legal decisions or sign important papers.  Meals: Start with liquid foods such as gelatin or soup. Progress to regular foods as tolerated. Avoid greasy, spicy, heavy foods. If nausea and/or vomiting occur, drink only clear liquids until the nausea and/or vomiting subsides. Call your physician if vomiting continues.  Special Instructions/Symptoms: Your throat may feel dry or sore from the anesthesia or the breathing tube placed in your throat during surgery. If this causes discomfort, gargle with warm salt water. The discomfort should disappear within 24 hours.  If you had a scopolamine patch placed behind your ear for the management of post- operative nausea and/or vomiting:  1. The medication in the patch is effective for 72 hours, after which it should be removed.  Wrap  patch in a tissue and discard in the trash. Wash hands thoroughly with soap and water. 2. You may remove the patch earlier than 72 hours if you experience unpleasant side effects which may include dry mouth, dizziness or visual disturbances. 3. Avoid touching the patch. Wash your hands with soap and water after contact with the patch.

## 2016-01-06 ENCOUNTER — Encounter (HOSPITAL_BASED_OUTPATIENT_CLINIC_OR_DEPARTMENT_OTHER): Payer: Self-pay | Admitting: Urology

## 2016-01-26 ENCOUNTER — Encounter: Payer: Self-pay | Admitting: Family Medicine

## 2016-02-07 ENCOUNTER — Other Ambulatory Visit: Payer: Self-pay | Admitting: Family Medicine

## 2016-02-10 ENCOUNTER — Other Ambulatory Visit: Payer: Self-pay | Admitting: Urology

## 2016-02-10 ENCOUNTER — Encounter (HOSPITAL_COMMUNITY): Payer: Self-pay | Admitting: *Deleted

## 2016-02-11 NOTE — H&P (Signed)
Chief Complaint Nephrolithiasis    Referring provider: Pearson Forster   History of Present Illness        The patient is a 50 year old female who presents with a 5 mm proximal right ureteral stone. She also has a nonobstructing left 7 mm renal stone. Both stones seemed to be visible on KUB at the level of L3. The pain has become unbearable over the last 24 hours. The symptoms first started approximately 5 days ago. She is unable to keep food on this point. She has constant vomiting. She does not eat anything today. She has not had fevers or chills.     The patient underwent cystoscopy, ureteroscopy, stone extraction, ureteral stent with stent later removed. Her stone was 35% calcium phosphate 65% calcium oxalate dihydrate.       She presents today for post-instrumentation renal ultrasound. Her right renal ultrasounds is unremarkable. She does have the previously known stones in her left kidney.     She stopped her Topamax since her last visit since she was taken for headaches. These were her first instance of stone disease. She would like to have her left stone treated with lithotripsy.   Past Medical History Problems  1. History of Anxiety (F41.9) 2. History of depression (Z86.59) 3. History of hypertension (Z86.79)  Surgical History Problems  1. History of Cystoscopy With Insertion Of Ureteral Stent Right 2. History of Cystoscopy With Insertion Of Ureteral Stent Right 3. History of Cystoscopy With Ureteroscopy With Removal Of Calculus 4. History of Hand Surgery 5. History of Hysterectomy  Current Meds 1. Losartan Potassium 100 MG Oral Tablet;  Therapy: (Recorded:27Feb2017) to Recorded 2. Maxalt 10 MG Oral Tablet;  Therapy: (Recorded:27Feb2017) to Recorded 3. Multi-Day Vitamins TABS;  Therapy: (Recorded:27Feb2017) to Recorded 4. Ondansetron 4 MG Oral Tablet Dispersible;  Therapy: (Recorded:27Feb2017) to Recorded 5. Oxycodone-Acetaminophen 5-325 MG Oral Tablet;   Therapy: (Recorded:27Feb2017) to Recorded 6. Tamsulosin HCl - 0.4 MG Oral Capsule;  Therapy: (Recorded:27Feb2017) to Recorded 7. Topiramate TABS;  Therapy: (Recorded:27Feb2017) to Recorded 8. Verapamil HCl ER 180 MG Oral Capsule Extended Release 24 Hour;  Therapy: (Recorded:27Feb2017) to Recorded 9. Vitamin B-12 TABS;  Therapy: (Recorded:27Feb2017) to Recorded  Allergies Medication  1. Sulfa Drugs  Family History Problems  1. Family history of cardiac disorder (Z82.49) : Father 2. Family history of diabetes mellitus (Z83.3) : Mother  Social History Problems  1. Alcohol use (Z78.9) 2. Denied: History of Caffeine use 3. Father deceased   Heart disease at 70 4. Mother alive and healthy   92 5. Never a smoker 6. 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 No acute distress.   ENT:. The ears and nose are normal in appearance.   Neck: The appearance of the neck is normal.   Pulmonary: No respiratory distress.   Cardiovascular:. No peripheral edema.   Abdomen: The abdomen is soft and nontender.   Skin: Normal skin turgor and no visible rash.   Neuro/Psych:. Mood and affect are appropriate. No focal sensory deficits.    Results/Data Urine [Data Includes: Last 1 Day]   17Apr2017  COLOR YELLOW   APPEARANCE CLEAR   SPECIFIC GRAVITY 1.025   pH 5.0   GLUCOSE NEGATIVE   BILIRUBIN NEGATIVE   KETONE NEGATIVE   BLOOD NEGATIVE   PROTEIN NEGATIVE   NITRITE NEGATIVE   LEUKOCYTE ESTERASE TRACE   SQUAMOUS EPITHELIAL/HPF 0-5 HPF  WBC 0-5 WBC/HPF  RBC 0-2  RBC/HPF  BACTERIA NONE SEEN HPF  CRYSTALS NONE SEEN HPF  CASTS NONE SEEN LPF  Yeast NONE SEEN HPF   Renal ultrasound:  Right kidney: Length 12.16 cm. Depth 4.80 cm. Cortical width 1.40 cm. Width 5.2 cm. No hydronephrosis  Left kidney: Length 12.49 cm. Depth 5.16 cm. Cortical width 1.44 cm. Width 5.69 cm. 0.78 cm lower pole stone.     Postvoid residual 5.91 cm.    Impression:  1. No hydronephrosis  2. Stable left nephrolithiasis   Assessment Assessed  1. Nephrolithiasis (N20.0)  Plan Health Maintenance  1. UA With REFLEX; [Do Not Release]; Status:Complete;   Done: [id number] 10:52AM Nephrolithiasis  2. URINE CULTURE; Status:In Progress - Specimen/Data Collected;   Done: [id number]  1. Right ureteral stone  -Negative post-instrumentation renal ultrasound  -The patient has stopped her Topamax since her last visit.    2. Left nonobstructing renal stone  I discussed the risks, benefits, indications of left extra corporeal shockwave lithotripsy. The patient understands the goal is to break the stone into small fragments that she'll been passed. She understands the risk include but are not limited to steinstrasse, repeat procedures, hematoma, bleeding, infection among others. All questions were answered. She is elected to proceed. Her left renal stone is visible on previous KUB.    3. Microscopic hematuria  -Resolved   Amendment CC: Pearson Forster   Signatures Electronically signed by : Baruch Gouty, M.D.; Feb 09 2016 12:12PM EST  Addendum: Chart and imaging reviewed.  7 mm left lower pole stone.  Urine culture was negative.

## 2016-02-12 ENCOUNTER — Ambulatory Visit (HOSPITAL_COMMUNITY)
Admission: RE | Admit: 2016-02-12 | Discharge: 2016-02-12 | Disposition: A | Discharge status: Home / Self Care | Payer: BLUE CROSS/BLUE SHIELD | Source: Ambulatory Visit | Attending: Urology | Admitting: Urology

## 2016-02-12 ENCOUNTER — Encounter (HOSPITAL_COMMUNITY): Payer: Self-pay | Admitting: *Deleted

## 2016-02-12 ENCOUNTER — Encounter (HOSPITAL_COMMUNITY)
Admission: RE | Disposition: A | Discharge status: Home / Self Care | Payer: Self-pay | Source: Ambulatory Visit | Attending: Urology

## 2016-02-12 ENCOUNTER — Ambulatory Visit (HOSPITAL_COMMUNITY): Payer: BLUE CROSS/BLUE SHIELD

## 2016-02-12 DIAGNOSIS — N200 Calculus of kidney: Secondary | ICD-10-CM | POA: Diagnosis not present

## 2016-02-12 DIAGNOSIS — I10 Essential (primary) hypertension: Secondary | ICD-10-CM | POA: Diagnosis not present

## 2016-02-12 DIAGNOSIS — Z79899 Other long term (current) drug therapy: Secondary | ICD-10-CM | POA: Diagnosis not present

## 2016-02-12 SURGERY — LITHOTRIPSY, ESWL
Anesthesia: LOCAL | Laterality: Left

## 2016-02-12 MED ORDER — CIPROFLOXACIN HCL 500 MG PO TABS
500.0000 mg | ORAL_TABLET | ORAL | Status: AC
  Administered 2016-02-12: 500 mg via ORAL
  Filled 2016-02-12: qty 1

## 2016-02-12 MED ORDER — SODIUM CHLORIDE 0.9 % IV SOLN
INTRAVENOUS | Status: DC
  Administered 2016-02-12: 07:00:00 via INTRAVENOUS

## 2016-02-12 MED ORDER — OXYCODONE-ACETAMINOPHEN 5-325 MG PO TABS: 1.0000 | ORAL_TABLET | Freq: Four times a day (QID) | ORAL | Status: DC | PRN

## 2016-02-12 MED ORDER — DIPHENHYDRAMINE HCL 25 MG PO CAPS
25.0000 mg | ORAL_CAPSULE | ORAL | Status: AC
  Administered 2016-02-12: 25 mg via ORAL
  Filled 2016-02-12: qty 1

## 2016-02-12 MED ORDER — DIAZEPAM 5 MG PO TABS
10.0000 mg | ORAL_TABLET | ORAL | Status: AC
  Administered 2016-02-12: 10 mg via ORAL
  Filled 2016-02-12: qty 2

## 2016-02-12 MED ORDER — ALFUZOSIN HCL ER 10 MG PO TB24
10.0000 mg | ORAL_TABLET | Freq: Every day | ORAL | Status: DC
Start: 2016-02-12 — End: 2016-03-18

## 2016-02-12 NOTE — Op Note (Signed)
Left ESWL  42mm LLP stone - fragmented well. Pt tolerated well. Stable.

## 2016-02-12 NOTE — Interval H&P Note (Signed)
History and Physical Interval Note:  02/12/2016 7:42 AM  Courtney Hale  has presented today for surgery, with the diagnosis of LEFT RENAL STONE  The various methods of treatment have been discussed with the patient and family. After consideration of risks, benefits and other options for treatment, the patient has consented to  Procedure(s): LEFT EXTRACORPOREAL SHOCK WAVE LITHOTRIPSY (ESWL) (Left) as a surgical intervention .  The patient's history has been reviewed, patient examined, no change in status, stable for surgery.  I have reviewed the patient's chart, images and labs. She has been well. No dysuria or fever. I discussed with the patient the nature, potential benefits, risks and alternatives to ESWL, including side effects of the proposed treatment, the likelihood of the patient achieving the goals of the procedure, and any potential problems that might occur during the procedure or recuperation. All questions answered. Patient elects to proceed. She said knowing her luck she would "need another stent".     Tayshon Winker

## 2016-02-12 NOTE — Progress Notes (Signed)
Ambulated to BR post ESWL left side, minimal assist. Voided moderate amount tea colored urine. No clots or particles. Pt ready for discharge

## 2016-02-12 NOTE — Discharge Instructions (Signed)
Lithotripsy, Care After °Refer to this sheet in the next few weeks. These instructions provide you with information on caring for yourself after your procedure. Your health care provider may also give you more specific instructions. Your treatment has been planned according to current medical practices, but problems sometimes occur. Call your health care provider if you have any problems or questions after your procedure. °WHAT TO EXPECT AFTER THE PROCEDURE  °· Your urine may have a red tinge for a few days after treatment. Blood loss is usually minimal. °· You may have soreness in the back or flank area. This usually goes away after a few days. The procedure can cause blotches or bruises on the back where the pressure wave enters the skin. These marks usually cause only minimal discomfort and should disappear in a short time. °· Stone fragments should begin to pass within 24 hours of treatment. However, a delayed passage is not unusual. °· You may have pain, discomfort, and feel sick to your stomach (nauseated) when the crushed fragments of stone are passed down the tube from the kidney to the bladder. Stone fragments can pass soon after the procedure and may last for up to 4-8 weeks. °· A small number of patients may have severe pain when stone fragments are not able to pass, which leads to an obstruction. °· If your stone is greater than 1 inch (2.5 cm) in diameter or if you have multiple stones that have a combined diameter greater than 1 inch (2.5 cm), you may require more than one treatment. °· If you had a stent placed prior to your procedure, you may experience some discomfort, especially during urination. You may experience the pain or discomfort in your flank or back, or you may experience a sharp pain or discomfort at the base of your penis or in your lower abdomen. The discomfort usually lasts only a few minutes after urinating. °HOME CARE INSTRUCTIONS  °· Rest at home until you feel your energy  improving. °· Only take over-the-counter or prescription medicines for pain, discomfort, or fever as directed by your health care provider. Depending on the type of lithotripsy, you may need to take antibiotics and anti-inflammatory medicines for a few days. °· Drink enough water and fluids to keep your urine clear or pale yellow. This helps "flush" your kidneys. It helps pass any remaining pieces of stone and prevents stones from coming back. °· Most people can resume daily activities within 1-2 days after standard lithotripsy. It can take longer to recover from laser and percutaneous lithotripsy. °· Strain all urine through the provided strainer. Keep all particulate matter and stones for your health care provider to see. The stone may be as small as a grain of salt. It is very important to use the strainer each and every time you pass your urine. Any stones that are found can be sent to a medical lab for examination. °· Visit your health care provider for a follow-up appointment in a few weeks. Your doctor may remove your stent if you have one. Your health care provider will also check to see whether stone particles still remain. °SEEK MEDICAL CARE IF:  °· Your pain is not relieved by medicine. °· You have a lasting nauseous feeling. °· You feel there is too much blood in the urine. °· You develop persistent problems with frequent or painful urination that does not at least partially improve after 2 days following the procedure. °· You have a congested cough. °· You feel   lightheaded. °· You develop a rash or any other signs that might suggest an allergic problem. °· You develop any reaction or side effects to your medicine(s). °SEEK IMMEDIATE MEDICAL CARE IF:  °· You experience severe back or flank pain or both. °· You see nothing but blood when you urinate. °· You cannot pass any urine at all. °· You have a fever or shaking chills. °· You develop shortness of breath, difficulty breathing, or chest pain. °· You  develop vomiting that will not stop after 6-8 hours. °· You have a fainting episode. °  °This information is not intended to replace advice given to you by your health care provider. Make sure you discuss any questions you have with your health care provider. °  °Document Released: 10/31/2007 Document Revised: 07/02/2015 Document Reviewed: 04/26/2013 °Elsevier Interactive Patient Education ©2016 [employer] ° °

## 2016-03-18 ENCOUNTER — Ambulatory Visit (INDEPENDENT_AMBULATORY_CARE_PROVIDER_SITE_OTHER): Payer: BLUE CROSS/BLUE SHIELD | Admitting: Gastroenterology

## 2016-03-18 ENCOUNTER — Encounter: Payer: Self-pay | Admitting: Gastroenterology

## 2016-03-18 VITALS — BP 123/70 | HR 74 | Temp 99.4°F | Ht 63.5 in | Wt 174.0 lb

## 2016-03-18 DIAGNOSIS — R1900 Intra-abdominal and pelvic swelling, mass and lump, unspecified site: Secondary | ICD-10-CM | POA: Insufficient documentation

## 2016-03-18 DIAGNOSIS — K439 Ventral hernia without obstruction or gangrene: Secondary | ICD-10-CM | POA: Diagnosis not present

## 2016-03-18 DIAGNOSIS — M6208 Separation of muscle (nontraumatic), other site: Secondary | ICD-10-CM | POA: Insufficient documentation

## 2016-03-18 DIAGNOSIS — C439 Malignant melanoma of skin, unspecified: Secondary | ICD-10-CM | POA: Insufficient documentation

## 2016-03-18 DIAGNOSIS — K589 Irritable bowel syndrome without diarrhea: Secondary | ICD-10-CM | POA: Diagnosis not present

## 2016-03-18 NOTE — Progress Notes (Signed)
Primary Care Physician: [name], MD  Primary Gastroenterologist:  Barney Drain, MD   Chief Complaint  Patient presents with  . Bloated    knot on stomach    HPI: Courtney Hale is a 50 y.o. female here for follow up. She is no longer on Viberzi, couldn't afford it. 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 She is aware that she cannot take Viberzi if her gallbladder is ever removed.   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 "Caught in time". This is her second episode of melanoma. The first time it was on her leg. She is followed closely by Dr. Juel Burrow office. She is also had issues with kidney stones.  She complains mostly of bloating. People ask her if she's pregnant. Several bowel movements daily. Usually 2-3, always loose. Currently not taking anything for her bowels. No melena or rectal bleeding. Noticed a bulge in her upper abdomen when she was on the exercise ball last week. Some associated discomfort. Never noticed it before. No nausea or vomiting. No unintentional weight loss.  Current Outpatient Prescriptions  Medication Sig Dispense Refill  . citalopram (CELEXA) 20 MG tablet Take 20 mg by mouth every morning.    Marland Kitchen losartan (COZAAR) 100 MG tablet Take 1 tablet (100 mg total) by mouth daily. (Patient taking differently: Take 100 mg by mouth every morning. ) 30 tablet 5  . rizatriptan (MAXALT-MLT) 10 MG disintegrating tablet Take 1 tablet (10 mg total) by mouth as needed for migraine. May repeat in 2 hours if needed. Max 2 per 24 hours 10 tablet 0  . verapamil (CALAN-SR) 180 MG CR tablet TAKE ONE TABLET BY MOUTH ONCE DAILY 30 tablet 4   No current facility-administered medications for this visit.    Allergies as of 03/18/2016 - Review Complete 03/18/2016  Allergen Reaction Noted  . Augmentin [amoxicillin-pot clavulanate] Other (See Comments) 01/27/2013  . Sulfa antibiotics Other (See Comments) 12/05/2012    ROS:  General:  Negative for anorexia, weight loss, fever, chills, fatigue, weakness. ENT: Negative for hoarseness, difficulty swallowing , nasal congestion. CV: Negative for chest pain, angina, palpitations, dyspnea on exertion, peripheral edema.  Respiratory: Negative for dyspnea at rest, dyspnea on exertion, cough, sputum, wheezing.  GI: See history of present illness. GU:  Negative for dysuria, hematuria, urinary incontinence, urinary frequency, nocturnal urination.  Endo: Negative for unusual weight change.    Physical Examination:   BP 123/70 mmHg  Pulse 74  Temp(Src) 99.4 F (37.4 C) (Oral)  Ht 5' 3.5" (1.613 m)  Wt 174 lb (78.926 kg)  BMI 30.34 kg/m2  LMP 10/26/2014  General: Well-nourished, well-developed in no acute distress.  Eyes: No icterus. Mouth: Oropharyngeal mucosa moist and pink , no lesions erythema or exudate. Lungs: Clear to auscultation bilaterally.  Heart: Regular rate and rhythm, no murmurs rubs or gallops.  Abdomen: Bowel sounds are normal, bulging noted in the central abdomen, 3-4 inches in diameter, rounded nature. Did not appreciate abdominal wall defect, this may be diastases recti. Associated with tenderness.   Extremities: No lower extremity edema. No clubbing or deformities. Neuro: Alert and oriented x 4   Skin: Warm and dry, no jaundice.   Psych: Alert and cooperative, normal mood and affect.  Labs:  Lab Results  Component Value Date   WBC 9.0 12/18/2015   HGB 12.3 12/18/2015   HCT 36.0 12/18/2015   MCV 86.5 12/18/2015   PLT 269 12/18/2015  Lab Results  Component Value Date   CREATININE 0.70 12/18/2015   BUN 14 12/18/2015   NA 143 12/18/2015   K 3.5 12/18/2015   CL 110 12/18/2015   CO2 21* 12/18/2015   Lab Results  Component Value Date   ALT 28 12/18/2015   AST 27 12/18/2015   ALKPHOS 91 12/18/2015   BILITOT 0.5 12/18/2015   Lab Results  Component Value Date   TSH 0.785 08/13/2015    Imaging Studies: No results found.

## 2016-03-18 NOTE — Assessment & Plan Note (Signed)
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 She has a history of melanoma recently removed from her back, for years ago one was removed from her leg. Discussed at length with patient, suspect her bloating/swelling symptoms may be IBS related. She does have a history of "enteritis" extensively evaluated at [facility] including push enteroscopy with no evidence of Crohn's disease.  Suspect abnormal contour the upper abdomen due to rectus diastases but cannot exclude underlying hernia. Patient describes rapid appearance with exercising.  Plan on CT abdomen and pelvis. Further evaluate abdominal wall. Doubt we are dealing with malignancy or worsening enteritis. Trial of probiotics one daily for bloating. Could consider adding Bentyl for stool frequency/urgency if no improvement on probiotics. Further recommendations to follow.

## 2016-03-18 NOTE — Patient Instructions (Signed)
1. Start probiotic 1 daily. Samples provided. You may stop when you complete samples. 2. CT scan of abdomen. We will call you with results within 5-7 business days.

## 2016-03-18 NOTE — Progress Notes (Signed)
cc'ed to pcp °

## 2016-03-26 ENCOUNTER — Ambulatory Visit (HOSPITAL_COMMUNITY)
Admission: RE | Admit: 2016-03-26 | Discharge: 2016-03-26 | Disposition: A | Discharge status: Home / Self Care | Payer: BLUE CROSS/BLUE SHIELD | Source: Ambulatory Visit | Attending: Gastroenterology | Admitting: Gastroenterology

## 2016-03-26 DIAGNOSIS — K589 Irritable bowel syndrome without diarrhea: Secondary | ICD-10-CM | POA: Diagnosis present

## 2016-03-26 DIAGNOSIS — K429 Umbilical hernia without obstruction or gangrene: Secondary | ICD-10-CM | POA: Insufficient documentation

## 2016-03-26 DIAGNOSIS — R1900 Intra-abdominal and pelvic swelling, mass and lump, unspecified site: Secondary | ICD-10-CM | POA: Diagnosis present

## 2016-03-26 DIAGNOSIS — K439 Ventral hernia without obstruction or gangrene: Secondary | ICD-10-CM | POA: Insufficient documentation

## 2016-03-26 DIAGNOSIS — D1803 Hemangioma of intra-abdominal structures: Secondary | ICD-10-CM | POA: Insufficient documentation

## 2016-03-26 DIAGNOSIS — N200 Calculus of kidney: Secondary | ICD-10-CM | POA: Diagnosis not present

## 2016-03-26 DIAGNOSIS — C439 Malignant melanoma of skin, unspecified: Secondary | ICD-10-CM

## 2016-03-26 MED ORDER — IOPAMIDOL (ISOVUE-300) INJECTION 61%
100.0000 mL | Freq: Once | INTRAVENOUS | Status: AC | PRN
  Administered 2016-03-26: 100 mL via INTRAVENOUS

## 2016-03-29 NOTE — Progress Notes (Signed)
Quick Note:  Please let patient know that she has stable hepatic hemangioma, 94mm left kidney stone without obstruction, small umbilical hernia containing fat only. Moderate stool in left colon.  Suspect the area of concern in upper abd due to rectus diastasis, weakened abdominal wall muscles. Nothing to do about umbilical hernia she develops abdominal pain related to this.  Recommend completing probiotics as planned. Call if ongoing problems. ______

## 2016-03-30 NOTE — Progress Notes (Signed)
Quick Note:  LMOM to call. ______ 

## 2016-03-30 NOTE — Progress Notes (Signed)
Quick Note:  PT called and is aware. ______

## 2016-04-06 ENCOUNTER — Other Ambulatory Visit: Payer: Self-pay | Admitting: Nurse Practitioner

## 2016-04-09 ENCOUNTER — Telehealth: Payer: Self-pay | Admitting: Gastroenterology

## 2016-04-09 NOTE — Telephone Encounter (Signed)
PATIENT WOULD LIKE A PRESCRIPTION OF ALIGN TO BE CALLED INTO [facility] [place], STATED THE SAMPLES ARE WORKING WELL  [phone]

## 2016-04-09 NOTE — Telephone Encounter (Signed)
I called and told pt the Align is OTC. She said OK, she will get some.

## 2016-04-19 ENCOUNTER — Other Ambulatory Visit: Payer: Self-pay | Admitting: Family Medicine

## 2016-05-12 ENCOUNTER — Telehealth: Payer: Self-pay | Admitting: Nurse Practitioner

## 2016-05-12 ENCOUNTER — Telehealth: Payer: Self-pay | Admitting: Family Medicine

## 2016-05-12 MED ORDER — LOSARTAN POTASSIUM 100 MG PO TABS: ORAL_TABLET | ORAL | Status: DC

## 2016-05-12 NOTE — Telephone Encounter (Signed)
Pt would like a refill on her losartan, she has an ov for the 3rd of aug for her  Med check   [facility] [place]

## 2016-05-12 NOTE — Telephone Encounter (Signed)
Spoke with patient and informed her that a refill was sent over to her pharmacy. Patient verbalized understanding.

## 2016-05-12 NOTE — Telephone Encounter (Signed)
Pt states she needs a refill on her

## 2016-05-27 ENCOUNTER — Ambulatory Visit: Payer: BLUE CROSS/BLUE SHIELD | Admitting: Nurse Practitioner

## 2016-05-27 ENCOUNTER — Encounter: Payer: Self-pay | Admitting: Family Medicine

## 2016-06-09 ENCOUNTER — Other Ambulatory Visit: Payer: Self-pay | Admitting: Nurse Practitioner

## 2016-06-09 ENCOUNTER — Ambulatory Visit (INDEPENDENT_AMBULATORY_CARE_PROVIDER_SITE_OTHER): Payer: BLUE CROSS/BLUE SHIELD | Admitting: Nurse Practitioner

## 2016-06-09 ENCOUNTER — Encounter: Payer: Self-pay | Admitting: Nurse Practitioner

## 2016-06-09 VITALS — BP 136/88 | Ht 63.5 in | Wt 183.0 lb

## 2016-06-09 DIAGNOSIS — K589 Irritable bowel syndrome without diarrhea: Secondary | ICD-10-CM | POA: Diagnosis not present

## 2016-06-09 DIAGNOSIS — M6740 Ganglion, unspecified site: Secondary | ICD-10-CM

## 2016-06-09 DIAGNOSIS — M791 Myalgia, unspecified site: Secondary | ICD-10-CM

## 2016-06-09 DIAGNOSIS — R5383 Other fatigue: Secondary | ICD-10-CM | POA: Diagnosis not present

## 2016-06-09 DIAGNOSIS — I10 Essential (primary) hypertension: Secondary | ICD-10-CM

## 2016-06-09 DIAGNOSIS — M797 Fibromyalgia: Secondary | ICD-10-CM

## 2016-06-09 DIAGNOSIS — G43709 Chronic migraine without aura, not intractable, without status migrainosus: Secondary | ICD-10-CM | POA: Diagnosis not present

## 2016-06-09 DIAGNOSIS — I8312 Varicose veins of left lower extremity with inflammation: Secondary | ICD-10-CM

## 2016-06-09 DIAGNOSIS — I8311 Varicose veins of right lower extremity with inflammation: Secondary | ICD-10-CM

## 2016-06-09 DIAGNOSIS — I872 Venous insufficiency (chronic) (peripheral): Secondary | ICD-10-CM | POA: Insufficient documentation

## 2016-06-09 MED ORDER — LOSARTAN POTASSIUM 100 MG PO TABS: ORAL_TABLET | ORAL | 5 refills | Status: DC

## 2016-06-09 MED ORDER — VERAPAMIL HCL ER 180 MG PO TBCR
180.0000 mg | EXTENDED_RELEASE_TABLET | Freq: Every day | ORAL | 5 refills | Status: DC
Start: 2016-06-09 — End: 2017-02-27

## 2016-06-09 MED ORDER — CITALOPRAM HYDROBROMIDE 20 MG PO TABS: 20.0000 mg | ORAL_TABLET | Freq: Every morning | ORAL | 5 refills | Status: DC

## 2016-06-09 MED ORDER — RIZATRIPTAN BENZOATE 10 MG PO TBDP: 10.0000 mg | ORAL_TABLET | ORAL | 5 refills | Status: DC | PRN

## 2016-06-09 NOTE — Progress Notes (Signed)
Subjective:  Presents for multiple issues. Recently had to stop her Topamax to 2 renal stones. Was told that part of the stones was made up of her Topamax. Stopped her verapamil on her own. Denies any adverse effects. Also complaining of generalized pain from "head to toe". Was diagnosed with fibromyalgia by her local endocrinologist years ago, did not follow-up for treatment. Has not been seen for this since. Since stopping Topamax has had a flareup of her migraines, taking Maxalt 3-4 times per week. Has noticed a cyst at the base of her left thumb for the past 3-4 weeks. Very tender. Has had some surgery on her right hand one of them to remove a cyst. Has a history of IBS, currently in a constipation phase. Also complaints of recurrent mildly erythematous stinging rash on the lower anterior legs resolves on its own after a few days. Has a picture on her phone of a mildly erythematous confluent rash anterior lower extremity. Occasional mild edema. Continues to struggle with her weight. Some fatigue.  Objective:   BP 136/88   Ht 5' 3.5" (1.613 m)   Wt 183 lb (83 kg)   LMP 10/26/2014   BMI 31.91 kg/m  NAD. Alert, oriented. Lungs clear. Heart regular rate rhythm. Lower extremities trace pitting edema with multiple superficial varicosities. Some mild venous stasis color changes to the anterior lower legs bilaterally. Some difficulty assessing due to hand skin. Nontender to palpation. No active erythematous rash. Small mobile nodule noted on the tendon the base of the left thumb, tender to palpation.  Assessment:  Problem List Items Addressed This Visit      Cardiovascular and Mediastinum   Headache, chronic migraine without aura   Relevant Medications   verapamil (CALAN-SR) 180 MG CR tablet   rizatriptan (MAXALT-MLT) 10 MG disintegrating tablet   citalopram (CELEXA) 20 MG tablet   losartan (COZAAR) 100 MG tablet   Hypertension - Primary   Relevant Medications   verapamil (CALAN-SR) 180 MG CR  tablet   losartan (COZAAR) 100 MG tablet     Digestive   IBS (irritable bowel syndrome)   Relevant Medications   Calcium Polycarbophil (FIBER-CAPS PO)     Musculoskeletal and Integument   Venous stasis dermatitis of both lower extremities    Other Visit Diagnoses    Other fatigue       Relevant Orders   TSH   VITAMIN D 25 Hydroxy (Vit-D Deficiency, Fractures)   Vitamin B12   Ferritin   Myalgia       Relevant Orders   TSH   VITAMIN D 25 Hydroxy (Vit-D Deficiency, Fractures)   Vitamin B12   Ferritin   Ganglion cyst base of left thumb       Plan:  Meds ordered this encounter  Medications  . vitamin B-12 (CYANOCOBALAMIN) 250 MCG tablet    Sig: Take 250 mcg by mouth daily.  . Calcium Polycarbophil (FIBER-CAPS PO)    Sig: Take by mouth.  . verapamil (CALAN-SR) 180 MG CR tablet    Sig: Take 1 tablet (180 mg total) by mouth daily.    Dispense:  30 tablet    Refill:  5    Order Specific Question:   Supervising Provider    Answer:   [name] [[id number]]  . rizatriptan (MAXALT-MLT) 10 MG disintegrating tablet    Sig: Take 1 tablet (10 mg total) by mouth as needed for migraine. May repeat in 2 hours if needed. Max 2 per 24 hours  Dispense:  10 tablet    Refill:  5    Order Specific Question:   Supervising Provider    Answer:   [name] [[id number]]  . citalopram (CELEXA) 20 MG tablet    Sig: Take 1 tablet (20 mg total) by mouth every morning.    Dispense:  30 tablet    Refill:  5    Order Specific Question:   Supervising Provider    Answer:   [name] [[id number]]  . losartan (COZAAR) 100 MG tablet    Sig: TAKE ONE TABLET BY MOUTH ONCE DAILY    Dispense:  30 tablet    Refill:  5    Order Specific Question:   Supervising Provider    Answer:   [name] [[id number]]   Restart verapamil for BP and headaches. Her goal is for her to reduce her use of Maxalt to avoid rebound headaches. Patient to call back if no improvement over the next few weeks. Continue  other medications as directed. Lab work pending. Consider adding phentermine back to her regimen to help with weight loss depending on lab work. Refer to rheumatology for further evaluation. Patient to make an appointment with her hand specialist for recheck. Continue current measures for IBS which are helping.  Return in about 3 months (around 09/09/2016) for recheck.

## 2016-06-10 LAB — VITAMIN B12: Vitamin B-12: >2000 pg/mL — ABNORMAL HIGH (ref 211–946)

## 2016-06-10 LAB — FERRITIN: Ferritin: 52 ng/mL (ref 15–150)

## 2016-06-10 LAB — VITAMIN D 25 HYDROXY (VIT D DEFICIENCY, FRACTURES): Vit D, 25-Hydroxy: 32.4 ng/mL (ref 30.0–100.0)

## 2016-06-10 LAB — TSH: TSH: 1.22 u[IU]/mL (ref 0.450–4.500)

## 2016-06-14 ENCOUNTER — Telehealth: Payer: Self-pay | Admitting: Family Medicine

## 2016-06-14 NOTE — Telephone Encounter (Signed)
Patient requesting results to her labs.  Also, requesting Rx for her weight loss medication.    [facility]

## 2016-06-17 ENCOUNTER — Other Ambulatory Visit: Payer: Self-pay | Admitting: Nurse Practitioner

## 2016-06-17 MED ORDER — PHENTERMINE HCL 37.5 MG PO TABS: 37.5000 mg | ORAL_TABLET | Freq: Every day | ORAL | 2 refills | Status: DC

## 2016-06-17 NOTE — Telephone Encounter (Signed)
Answer sent through my chart and med sent in.

## 2016-06-23 ENCOUNTER — Ambulatory Visit (INDEPENDENT_AMBULATORY_CARE_PROVIDER_SITE_OTHER): Payer: BLUE CROSS/BLUE SHIELD | Admitting: Family Medicine

## 2016-06-23 ENCOUNTER — Encounter: Payer: Self-pay | Admitting: Family Medicine

## 2016-06-23 VITALS — BP 120/80 | Temp 98.7°F | Ht 63.5 in | Wt 181.0 lb

## 2016-06-23 DIAGNOSIS — M26609 Unspecified temporomandibular joint disorder, unspecified side: Secondary | ICD-10-CM

## 2016-06-23 MED ORDER — ETODOLAC 400 MG PO TABS: 400.0000 mg | ORAL_TABLET | Freq: Two times a day (BID) | ORAL | 2 refills | Status: DC

## 2016-06-23 MED ORDER — CYCLOBENZAPRINE HCL 10 MG PO TABS: 10.0000 mg | ORAL_TABLET | Freq: Every evening | ORAL | 2 refills | Status: DC | PRN

## 2016-06-23 NOTE — Progress Notes (Signed)
   Subjective:    Patient ID: Courtney Hale, female    DOB: 1966-02-05, 50 y.o.   MRN: [id number]  HPI Patient is here today for jaw pain. Onset 3 weeks ago.    Treatments tried: oxycodone with moderate relief. Patient has no other concerns at this time.   Pt notes jaw paoin bilat  Also tend in the jaws  ocas gum chewer  Patient admits to more stress lately. Not sure if she grinds her teeth at night or not. Next  Went to the dizziness. Was advised that this was not her teeth.   Review of Systems No headache, no major weight loss or weight gain, no chest pain no back pain abdominal pain no change in bowel habits complete ROS otherwise negative     Objective:   Physical Exam  Alert vitals stable, NAD. Blood pressure good on repeat. HEENT normal. Lungs clear. Heart regular rate and rhythm. Bilateral positive TMJ tenderness to palpation worse with extension and crepitations palpable right jaw      Assessment & Plan:  120 80 Impression TMJ syndrome discussed plan avoid chewing gum. Cut back on chewing of food in coming weeks. Flexeril 10 daily at bedtime. Lodine 400 twice a day with food when necessary symptom care discussed WSL

## 2016-06-23 NOTE — Patient Instructions (Signed)

## 2016-06-24 ENCOUNTER — Encounter: Payer: Self-pay | Admitting: Family Medicine

## 2016-06-29 ENCOUNTER — Encounter: Payer: Self-pay | Admitting: Family Medicine

## 2016-09-05 ENCOUNTER — Other Ambulatory Visit: Payer: Self-pay | Admitting: Family Medicine

## 2016-09-09 ENCOUNTER — Ambulatory Visit (INDEPENDENT_AMBULATORY_CARE_PROVIDER_SITE_OTHER): Payer: BLUE CROSS/BLUE SHIELD | Admitting: Nurse Practitioner

## 2016-09-09 ENCOUNTER — Encounter: Payer: Self-pay | Admitting: Nurse Practitioner

## 2016-09-09 VITALS — BP 132/88 | Ht 63.5 in | Wt 187.0 lb

## 2016-09-09 DIAGNOSIS — I10 Essential (primary) hypertension: Secondary | ICD-10-CM | POA: Diagnosis not present

## 2016-09-09 DIAGNOSIS — Z23 Encounter for immunization: Secondary | ICD-10-CM | POA: Diagnosis not present

## 2016-09-09 MED ORDER — LORCASERIN HCL 10 MG PO TABS: ORAL_TABLET | ORAL | 2 refills | Status: DC

## 2016-09-09 NOTE — Progress Notes (Signed)
Subjective:  Presents for routine follow-up. Has not lost any weight on phentermine, in fact has gained more weight particularly around the abdominal area. No chest pain/ischemic type pain or shortness of breath. Feels more depressed than usual which she relates to her weight gain. Tries to watch her diet, skips meals many times. Working third shift. Would like to try different medication for weight loss.  Objective:   BP 132/88   Ht 5' 3.5" (1.613 m)   Wt 187 lb (84.8 kg)   LMP 10/26/2014   BMI 32.61 kg/m  NAD. Alert, oriented. Lungs clear. Heart regular rate rhythm. Significant central obesity noted.  Assessment:  Problem List Items Addressed This Visit      Cardiovascular and Mediastinum   Hypertension - Primary    Other Visit Diagnoses    Morbid obesity ([place])       Relevant Medications   Lorcaserin HCl 10 MG TABS   Need for vaccination       Relevant Orders   Flu Vaccine QUAD 36+ mos PF IM (Fluarix & Fluzone Quad PF) (Completed)     Plan: Meds ordered this encounter  Medications  . Lorcaserin HCl 10 MG TABS    Sig: One po BID    Dispense:  60 tablet    Refill:  2    Order Specific Question:   Supervising Provider    Answer:   [name] [[id number]]   Stop phentermine. Trial of Belviq, start with 1 pill a day and if tolerated increase to twice a day. Discussed importance of regular activity and healthy diet. Refer to dietitian for consultation. Continue other medications as directed. DC Belviq and call if any adverse effects. Return in about 3 months (around 12/10/2016) for recheck.

## 2016-10-05 ENCOUNTER — Encounter: Payer: Self-pay | Admitting: Family Medicine

## 2016-10-14 ENCOUNTER — Encounter: Payer: Self-pay | Admitting: Gastroenterology

## 2016-10-14 ENCOUNTER — Ambulatory Visit (INDEPENDENT_AMBULATORY_CARE_PROVIDER_SITE_OTHER): Payer: BLUE CROSS/BLUE SHIELD | Admitting: Gastroenterology

## 2016-10-14 DIAGNOSIS — K439 Ventral hernia without obstruction or gangrene: Secondary | ICD-10-CM | POA: Diagnosis not present

## 2016-10-14 DIAGNOSIS — K580 Irritable bowel syndrome with diarrhea: Secondary | ICD-10-CM | POA: Diagnosis not present

## 2016-10-14 MED ORDER — PAROXETINE HCL 20 MG PO TABS: ORAL_TABLET | ORAL | 11 refills | Status: DC

## 2016-10-14 NOTE — Progress Notes (Signed)
Subjective:    Patient ID: Courtney Hale, female    DOB: [birth year], 50 y.o.   MRN: [id number] Courtney Hillier, MD   HPI Tried to lose weight on diet pill and got down to 145 lbs. PT REPORTS. DRINKING COCOA. PAIN IN MIDDLE ABDOMEN SOMETIMES. NOTICED PAIN IN LOWER AND UPPER ABDOMEN AS WELL.PICKING UP 60-70 LBS. BMs: 3-4X/DAY( #7). FEELS LIKE SHE'S CONSTIPATED(FEELS NEED TO HAVE BM BUT DOESN'T). SOMETIMES GETS SOB WITH HER DEPRESSION. STOPPED FIBER WHEN WENT OFF OTHER MEDS. BEEN ON CELEXA FOR YEARS. HAD BRBPR A COUPLE WEEKS AGO. MAY SEE BLOOD WHEN SHE WIPES. STAYS NAUSEATED. LAST TCS NOV 2016 FOR DIARRHEA-NL COLON Bx, HEMORRHOIDS.  PT DENIES FEVER, CHILLS, HEMATEMESIS, vomiting, melena, CHEST PAIN, SHORTNESS OF BREATH, CHANGE IN BOWEL IN HABITS, problems swallowing, OR heartburn or indigestion.  Past Medical History:  Diagnosis Date  . Complication of anesthesia    woke up during endoscopy and colonoscopy  . Depression   . Hemorrhoids   . History of esophageal stricture    2011  peptic w/  dilatation  . History of gastritis    2011  . History of melanoma excision    2015  left leg/   12-09-2015 back   . Hypertension   . Microhematuria   . Migraines   . Nephrolithiasis    left side non-obstructive   . Right ureteral stone   . Urgency of urination   . Wears contact lenses     Past Surgical History:  Procedure Laterality Date  . ABDOMINAL HYSTERECTOMY    . CARPAL TUNNEL RELEASE Right 01-05- 2015  . COLONOSCOPY N/A 08/27/2015   Procedure: COLONOSCOPY;  Surgeon: Danie Binder, MD;  Location: [facility];  Service: Endoscopy;  Laterality: N/A;  0830  . CYST EXCISION  2012     right hand  . CYSTOSCOPY W/ URETERAL STENT PLACEMENT Right 12/22/2015   Procedure: CYSTOSCOPY WITH RETROGRADE PYELOGRAM/URETERAL STENT PLACEMENT;  Surgeon: Nickie Retort, MD;  Location: [facility];  Service: Urology;  Laterality: Right;  . CYSTOSCOPY WITH RETROGRADE PYELOGRAM, URETEROSCOPY AND STENT  PLACEMENT Right 01/05/2016   Procedure: CYSTOSCOPY WITH RETROGRADE PYELOGRAM, URETEROSCOPY AND STENT REPLACEMENT;  Surgeon: Nickie Retort, MD;  Location: [facility];  Service: Urology;  Laterality: Right;  . DILATION AND CURETTAGE OF UTERUS  03-12-2009   w/  Suction  . double balloon enteroscopy     Dr. Arsenio Loader at [facility]: no erosions, no evidence of Crohn's disease, path without Crohn's.   [facility] EGD with push enteroscopy  02-12-2010    patent distal peptic stricture with diffuse antral erythema, normal D1 and D2   . LAPAROSCOPIC ASSISTED VAGINAL HYSTERECTOMY  10-26-2014   w/  Bilateral Salpingoophorectomy  . STONE EXTRACTION WITH BASKET Right 01/05/2016   Procedure: STONE EXTRACTION WITH BASKET;  Surgeon: Nickie Retort, MD;  Location: [facility];  Service: Urology;  Laterality: Right;  . TONSILLECTOMY  1998  approx    Allergies  Allergen Reactions  . Augmentin [Amoxicillin-Pot Clavulanate] Other (See Comments)    Gi upset  . Sulfa Antibiotics Other (See Comments)    Burning sensation, flushing to skin    Current Outpatient Prescriptions  Medication Sig Dispense Refill  . citalopram (CELEXA) 20 MG tablet Take 1 tablet (20 mg total) by mouth every morning.    . Cyanocobalamin (VITAMIN B-12) 2500 MCG SUBL Place 1 tablet under the tongue daily.    . cyclobenzaprine (FLEXERIL) 10 MG tablet Take 1 tablet (10  mg total) by mouth at bedtime as needed for muscle spasms.    . Lorcaserin HCl 10 MG TABS One po BID DIET PILL   . losartan (COZAAR) 100 MG tablet TAKE ONE TABLET BY MOUTH ONCE DAILY    . rizatriptan (MAXALT-MLT) 10 MG disintegrating tablet Take 1 tablet (10 mg total) by mouth as needed for migraine. May repeat in 2 hours if needed. Max 2 per 24 hours    . verapamil (CALAN-SR) 180 MG CR tablet Take 1 tablet (180 mg total) by mouth daily.    .      . etodolac (LODINE) 400 MG tablet TAKE ONE TABLET BY MOUTH TWICE DAILY WITH FOOD      Review of  Systems PER HPI OTHERWISE ALL SYSTEMS ARE NEGATIVE.    Objective:   Physical Exam  Constitutional: She is oriented to person, place, and time. She appears well-developed and well-nourished. No distress.  HENT:  Head: Normocephalic and atraumatic.  Mouth/Throat: Oropharynx is clear and moist. No oropharyngeal exudate.  Eyes: Pupils are equal, round, and reactive to light. No scleral icterus.  Neck: Normal range of motion. Neck supple.  Cardiovascular: Normal rate, regular rhythm and normal heart sounds.   Pulmonary/Chest: Effort normal and breath sounds normal. No respiratory distress.  Abdominal: Soft. Bowel sounds are normal. She exhibits no distension. There is tenderness. There is no rebound and no guarding.  MIDLINE BULGE THAT INCREASES WITH VALSALVA, REDUCIBLE, mild tenderness   Musculoskeletal: She exhibits no edema.  Lymphadenopathy:    She has no cervical adenopathy.  Neurological: She is alert and oriented to person, place, and time.  NO FOCAL DEFICITS  Psychiatric:  FLAT AFFECT, SLIGHTLY ANXIOUS MOOD  Vitals reviewed.     Assessment & Plan:

## 2016-10-14 NOTE — Assessment & Plan Note (Signed)
REDUCIBLE AND MOST LIKELY DUE TO RECTUS SHEATH WEAKNESS AND ASSOCIATED WITH PAIN WITH HEAVY LIFTING.  CONTINUE YOUR WEIGHT LOSS EFFORTS. NO LIFTING GREATER THAN 50 LBS WITHOUT ASSISTANCE. FOLLOW UP IN 4 MOS.

## 2016-10-14 NOTE — Progress Notes (Signed)
ON RECALL  °

## 2016-10-14 NOTE — Progress Notes (Signed)
cc'ed to pcp °

## 2016-10-14 NOTE — Assessment & Plan Note (Signed)
SYMPTOMS NOT IDEALLY CONTROLLED AND ASSOCIATED WITH WEIGHT GAIN.  FOLLOW A LOW FODMAP DIET.  HANDOUT GIVEN. TAKE CELEXA DEC 22, 24, 26, AND START PAXIL DEC 29. PAXIL MAY CAUSE CONSTIPATION. ADD FIBER IF YOU HAVE TROUBLE WITH CONSTIPATION. PLEASE CALL WITH QUESTIONS OR CONCERNS. FOLLOW UP IN 4 MOS.

## 2016-10-14 NOTE — Patient Instructions (Addendum)
FOLLOW A LOW FODMAP DIET. SEE HANDOUT.  TAKE CELEXA DEC 22, 24, 26, AND START PAXIL DEC 29. PAXIL MAY CAUSE CONSTIPATION. ADD FIBER IF YOU HAVE TROUBLE WITH CONSTIPATION.  PLEASE CALL WITH QUESTIONS OR CONCERNS.  NO LIFTING GREATER THAN 50 LBS WITHOUT ASSISTANCE.  FOLLOW UP IN 4 MOS.

## 2016-12-06 ENCOUNTER — Telehealth: Payer: Self-pay | Admitting: Pediatrics

## 2016-12-06 MED ORDER — OSELTAMIVIR PHOSPHATE 75 MG PO CAPS: 75.0000 mg | ORAL_CAPSULE | Freq: Two times a day (BID) | ORAL | 0 refills | Status: AC

## 2016-12-06 NOTE — Telephone Encounter (Signed)
Mother in office with child today c/o headache and other flu related symptoms.  Child was diagnosed with the flu.

## 2016-12-06 NOTE — Telephone Encounter (Signed)
tamiflu 75 bid five d 

## 2016-12-06 NOTE — Telephone Encounter (Signed)
Prescription sent electronically to pharmacy. Patient notified. 

## 2016-12-10 ENCOUNTER — Ambulatory Visit: Payer: BLUE CROSS/BLUE SHIELD | Admitting: Nurse Practitioner

## 2016-12-17 ENCOUNTER — Encounter: Payer: Self-pay | Admitting: Family Medicine

## 2016-12-17 ENCOUNTER — Encounter: Payer: Self-pay | Admitting: Nurse Practitioner

## 2016-12-17 ENCOUNTER — Ambulatory Visit (INDEPENDENT_AMBULATORY_CARE_PROVIDER_SITE_OTHER): Payer: BLUE CROSS/BLUE SHIELD | Admitting: Nurse Practitioner

## 2016-12-17 VITALS — BP 120/88 | Temp 98.3°F | Ht 63.5 in | Wt 189.4 lb

## 2016-12-17 DIAGNOSIS — G43709 Chronic migraine without aura, not intractable, without status migrainosus: Secondary | ICD-10-CM

## 2016-12-17 DIAGNOSIS — J329 Chronic sinusitis, unspecified: Secondary | ICD-10-CM

## 2016-12-17 DIAGNOSIS — F418 Other specified anxiety disorders: Secondary | ICD-10-CM

## 2016-12-17 DIAGNOSIS — F419 Anxiety disorder, unspecified: Secondary | ICD-10-CM

## 2016-12-17 DIAGNOSIS — F329 Major depressive disorder, single episode, unspecified: Secondary | ICD-10-CM

## 2016-12-17 MED ORDER — PAROXETINE HCL 20 MG PO TABS: ORAL_TABLET | ORAL | 2 refills | Status: DC

## 2016-12-17 MED ORDER — AZITHROMYCIN 250 MG PO TABS: ORAL_TABLET | ORAL | 0 refills | Status: DC

## 2016-12-18 ENCOUNTER — Encounter: Payer: Self-pay | Admitting: Nurse Practitioner

## 2016-12-18 DIAGNOSIS — F32A Depression, unspecified: Secondary | ICD-10-CM | POA: Insufficient documentation

## 2016-12-18 DIAGNOSIS — F419 Anxiety disorder, unspecified: Secondary | ICD-10-CM

## 2016-12-18 DIAGNOSIS — F329 Major depressive disorder, single episode, unspecified: Secondary | ICD-10-CM | POA: Insufficient documentation

## 2016-12-18 NOTE — Progress Notes (Signed)
Subjective:  Presents for c/o headaches over the past 2 months. Almost constant dull headaches. Has occasional migraine, last one was yesterday which caused her to miss work. Usually improved with Maxalt. Had to stop Topamax due to kidney stones. Was recently switched to Paxil by Dr. Oneida Alar. Had been on Celexa for years but did not seem to be working anymore. Was diagnosed with abd hernia but no further follow up needed with GI. Has noticed minimal improvement on Paxil 20 mg. Denies adverse effects. Minimal caffeine intake. No fever. Persistent sore throat. No difficulty swallowing. Frontal area headache. No runny nose or cough. No ear pain.   Objective:   BP 120/88   Temp 98.3 F (36.8 C) (Oral)   Ht 5' 3.5" (1.613 m)   Wt 189 lb 6 oz (85.9 kg)   LMP 10/26/2014   BMI 33.02 kg/m  NAD. Alert, oriented. Mildly anxious affect. TMs significant clear effusion. Pharynx injected with green PND noted. Neck supple with moderate tender anterior adenopathy. Lungs clear. Heart RRR. Abdomen obese, soft, no epigastric area tenderness with large mid abd bulging consistent with abd hernia.   Assessment:   Problem List Items Addressed This Visit      Cardiovascular and Mediastinum   Headache, chronic migraine without aura - Primary   Relevant Medications   PARoxetine (PAXIL) 20 MG tablet     Other   Anxiety and depression    Other Visit Diagnoses    Rhinosinusitis       Relevant Medications   azithromycin (ZITHROMAX Z-PAK) 250 MG tablet        Plan:   Meds ordered this encounter  Medications  . PARoxetine (PAXIL) 20 MG tablet    Sig: 1 1/2 tabs po qd    Dispense:  45 tablet    Refill:  2    Order Specific Question:   Supervising Provider    Answer:   [name] [[id number]]  . azithromycin (ZITHROMAX Z-PAK) 250 MG tablet    Sig: Take 2 tablets (500 mg) on  Day 1,  followed by 1 tablet (250 mg) once daily on Days 2 through 5.    Dispense:  6 each    Refill:  0    Order Specific Question:    Supervising Provider    Answer:   [name] [[id number]]   OTC  meds as directed for congestion. Headaches may be exacerbated by sinus pressure, weather or anxiety/depression. Discussed options. Will increase Paxil to 30 mg. To contact office to let us know how this dose is working. May need to increase to 40 mg, add Wellbutrin or change med.  Return in about 2 months (around 02/14/2017). Call sooner if needed.

## 2016-12-22 ENCOUNTER — Other Ambulatory Visit: Payer: Self-pay | Admitting: Nurse Practitioner

## 2016-12-27 ENCOUNTER — Encounter: Payer: Self-pay | Admitting: Gastroenterology

## 2016-12-27 DIAGNOSIS — Z029 Encounter for administrative examinations, unspecified: Secondary | ICD-10-CM

## 2016-12-28 ENCOUNTER — Telehealth: Payer: Self-pay | Admitting: Family Medicine

## 2016-12-28 NOTE — Telephone Encounter (Signed)
(  Message for Courtney Hale)-patient came in 3/5 to fill out form for FMLA paper work which was faxed to [place] on 2/27 was waiting on patient to fill out front sheet. Forms are in your box on side of file cabinet. Needing highlighted areas to be filled in not enough info on symptoms in notes.Forms has be be faxed by 3/16.

## 2016-12-31 NOTE — Telephone Encounter (Signed)
Done on 12/29/16 and given to [place].

## 2017-02-14 ENCOUNTER — Ambulatory Visit: Payer: BLUE CROSS/BLUE SHIELD | Admitting: Nurse Practitioner

## 2017-02-22 ENCOUNTER — Ambulatory Visit (INDEPENDENT_AMBULATORY_CARE_PROVIDER_SITE_OTHER): Payer: BLUE CROSS/BLUE SHIELD | Admitting: Nurse Practitioner

## 2017-02-22 ENCOUNTER — Encounter: Payer: Self-pay | Admitting: Nurse Practitioner

## 2017-02-22 VITALS — BP 112/82 | Ht 63.5 in | Wt 188.4 lb

## 2017-02-22 DIAGNOSIS — F329 Major depressive disorder, single episode, unspecified: Secondary | ICD-10-CM | POA: Diagnosis not present

## 2017-02-22 DIAGNOSIS — G43709 Chronic migraine without aura, not intractable, without status migrainosus: Secondary | ICD-10-CM | POA: Diagnosis not present

## 2017-02-22 DIAGNOSIS — F419 Anxiety disorder, unspecified: Secondary | ICD-10-CM | POA: Diagnosis not present

## 2017-02-22 MED ORDER — PHENTERMINE HCL 37.5 MG PO TABS: 37.5000 mg | ORAL_TABLET | Freq: Every day | ORAL | 2 refills | Status: DC

## 2017-02-22 MED ORDER — NAPROXEN 500 MG PO TABS: 500.0000 mg | ORAL_TABLET | Freq: Two times a day (BID) | ORAL | 0 refills | Status: DC

## 2017-02-22 MED ORDER — RIZATRIPTAN BENZOATE 10 MG PO TBDP: 10.0000 mg | ORAL_TABLET | ORAL | 5 refills | Status: DC | PRN

## 2017-02-23 ENCOUNTER — Encounter: Payer: Self-pay | Admitting: Nurse Practitioner

## 2017-02-23 NOTE — Progress Notes (Signed)
Subjective:  Presents for recheck on her migraines. Works third shift. Difficulty sleeping. Only gets 2-3 hours of sleep. Had 1 migraine that lasted a day a week before last, one last week that lasted 2 days. Maxalt does help headaches. Not currently taking an anti-inflammatory with migraines. Stopped her Paxil for about 3-4 days at one point but had significant serotonin withdrawal symptoms and exacerbation of her anxiety and depression, has been back on this for a while. Would like to increase dose to see if this will help. Patient also interested in restarting weight loss medicine, has taken the past without difficulty. Has a very active job.   Objective:   BP 112/82   Ht 5' 3.5" (1.613 m)   Wt 188 lb 6.4 oz (85.5 kg)   LMP 10/26/2014   BMI 32.85 kg/m  NAD. Alert, oriented. Calm affect. Lungs clear. Heart regular rate rhythm. Weight stable. Significant central obesity.  Assessment:   Problem List Items Addressed This Visit      Cardiovascular and Mediastinum   Headache, chronic migraine without aura - Primary   Relevant Medications   naproxen (NAPROSYN) 500 MG tablet   rizatriptan (MAXALT-MLT) 10 MG disintegrating tablet     Other   Anxiety and depression    Other Visit Diagnoses    Morbid obesity ([place])       Relevant Medications   phentermine (ADIPEX-P) 37.5 MG tablet       Plan:   Meds ordered this encounter  Medications  . naproxen (NAPROSYN) 500 MG tablet    Sig: Take 1 tablet (500 mg total) by mouth 2 (two) times daily with a meal. Prn migraines    Dispense:  30 tablet    Refill:  0    Order Specific Question:   Supervising Provider    Answer:   [name] [[id number]]  . phentermine (ADIPEX-P) 37.5 MG tablet    Sig: Take 1 tablet (37.5 mg total) by mouth daily before breakfast.    Dispense:  30 tablet    Refill:  2    Order Specific Question:   Supervising Provider    Answer:   [name] [[id number]]  . rizatriptan (MAXALT-MLT) 10 MG disintegrating tablet   Sig: Take 1 tablet (10 mg total) by mouth as needed for migraine. May repeat in 2 hours if needed. Max 2 per 24 hours    Dispense:  10 tablet    Refill:  5    Order Specific Question:   Supervising Provider    Answer:   [name] [[id number]]   Discussed options at length. Continue Maxalt as directed, take naproxen at onset of migraine as well. Discussed medications such as trazodone for sleep the patient wishes to try weight loss medicine first. Encourage regular activity and healthy diet. Discussed importance of adequate rest. Return in about 3 months (around 05/25/2017) for recheck. Call back sooner if needed.

## 2017-02-27 ENCOUNTER — Other Ambulatory Visit: Payer: Self-pay | Admitting: Nurse Practitioner

## 2017-02-27 ENCOUNTER — Other Ambulatory Visit: Payer: Self-pay | Admitting: Family Medicine

## 2017-04-27 ENCOUNTER — Other Ambulatory Visit: Payer: Self-pay | Admitting: Nurse Practitioner

## 2017-05-25 ENCOUNTER — Ambulatory Visit: Payer: BLUE CROSS/BLUE SHIELD | Admitting: Nurse Practitioner

## 2017-05-26 ENCOUNTER — Encounter: Payer: Self-pay | Admitting: Nurse Practitioner

## 2017-05-26 ENCOUNTER — Ambulatory Visit (INDEPENDENT_AMBULATORY_CARE_PROVIDER_SITE_OTHER): Payer: BLUE CROSS/BLUE SHIELD | Admitting: Nurse Practitioner

## 2017-05-26 VITALS — BP 122/88 | Ht 63.5 in | Wt 175.0 lb

## 2017-05-26 DIAGNOSIS — F419 Anxiety disorder, unspecified: Secondary | ICD-10-CM

## 2017-05-26 DIAGNOSIS — F329 Major depressive disorder, single episode, unspecified: Secondary | ICD-10-CM | POA: Diagnosis not present

## 2017-05-26 DIAGNOSIS — F32A Depression, unspecified: Secondary | ICD-10-CM

## 2017-05-26 MED ORDER — PHENTERMINE HCL 37.5 MG PO TABS: 37.5000 mg | ORAL_TABLET | Freq: Every day | ORAL | 2 refills | Status: DC

## 2017-05-26 MED ORDER — PAROXETINE HCL 40 MG PO TABS: 40.0000 mg | ORAL_TABLET | ORAL | 1 refills | Status: DC

## 2017-05-27 ENCOUNTER — Encounter: Payer: Self-pay | Admitting: Nurse Practitioner

## 2017-05-27 NOTE — Progress Notes (Signed)
Subjective:  Presents for recheck on anxiety and depression. Has done well on Paxil 30 mg would like to increase to 40 mg. Overall improvement but still experiencing some anxiety. Denies any adverse affects. Also doing well on phentermine. Doing better with her diet. Still limited activity. Denies any adverse effects.  Objective:   BP 122/88   Ht 5' 3.5" (1.613 m)   Wt 175 lb 0.2 oz (79.4 kg)   LMP 10/26/2014   BMI 30.52 kg/m  NAD. Alert, oriented. Cheerful affect. Thoughts logical coherent and relevant. Dressed properly. Making good eye contact. Lungs clear. Heart regular rate rhythm. Has lost over 13 pounds since her last visit.  Assessment:   Problem List Items Addressed This Visit      Other   Anxiety and depression - Primary   Morbid obesity ([place])   Relevant Medications   phentermine (ADIPEX-P) 37.5 MG tablet       Plan:   Meds ordered this encounter  Medications  . phentermine (ADIPEX-P) 37.5 MG tablet    Sig: Take 1 tablet (37.5 mg total) by mouth daily before breakfast.    Dispense:  30 tablet    Refill:  2    Order Specific Question:   Supervising Provider    Answer:   [name] [[id number]]  . PARoxetine (PAXIL) 40 MG tablet    Sig: Take 1 tablet (40 mg total) by mouth every morning.    Dispense:  90 tablet    Refill:  1    Order Specific Question:   Supervising Provider    Answer:   Maggie Font   May continue phentermine for 3 more months then discontinue for 6 months. Encouraged regular activity. Increase Paxil to 40 mg daily. Call back if any problems with new dosage. Return for recheck in 3-4 months.

## 2017-06-29 ENCOUNTER — Other Ambulatory Visit: Payer: Self-pay | Admitting: Nurse Practitioner

## 2017-07-12 ENCOUNTER — Emergency Department (HOSPITAL_COMMUNITY)
Admission: EM | Admit: 2017-07-12 | Discharge: 2017-07-13 | Disposition: A | Discharge status: Home / Self Care | Payer: BLUE CROSS/BLUE SHIELD | Attending: Emergency Medicine | Admitting: Emergency Medicine

## 2017-07-12 DIAGNOSIS — R079 Chest pain, unspecified: Secondary | ICD-10-CM | POA: Diagnosis present

## 2017-07-12 DIAGNOSIS — Z79899 Other long term (current) drug therapy: Secondary | ICD-10-CM | POA: Diagnosis not present

## 2017-07-12 DIAGNOSIS — R072 Precordial pain: Secondary | ICD-10-CM | POA: Diagnosis not present

## 2017-07-12 DIAGNOSIS — I10 Essential (primary) hypertension: Secondary | ICD-10-CM | POA: Diagnosis not present

## 2017-07-13 ENCOUNTER — Emergency Department (HOSPITAL_COMMUNITY): Payer: BLUE CROSS/BLUE SHIELD

## 2017-07-13 ENCOUNTER — Encounter (HOSPITAL_COMMUNITY): Payer: Self-pay | Admitting: *Deleted

## 2017-07-13 LAB — CBC
HCT: 36.3 % (ref 36.0–46.0)
Hemoglobin: 12.7 g/dL (ref 12.0–15.0)
MCH: 30.7 pg (ref 26.0–34.0)
MCHC: 35 g/dL (ref 30.0–36.0)
MCV: 87.7 fL (ref 78.0–100.0)
Platelets: 252 10*3/uL (ref 150–400)
RBC: 4.14 MIL/uL (ref 3.87–5.11)
RDW: 12.9 % (ref 11.5–15.5)
WBC: 6.2 10*3/uL (ref 4.0–10.5)

## 2017-07-13 LAB — BASIC METABOLIC PANEL
Anion gap: 9 (ref 5–15)
BUN: 11 mg/dL (ref 6–20)
CALCIUM: 8.5 mg/dL — AB (ref 8.9–10.3)
CO2: 24 mmol/L (ref 22–32)
CREATININE: 0.55 mg/dL (ref 0.44–1.00)
Chloride: 104 mmol/L (ref 101–111)
GFR calc Af Amer: >60 mL/min (ref >60)
GLUCOSE: 135 mg/dL — AB (ref 65–99)
Potassium: 3.3 mmol/L — ABNORMAL LOW (ref 3.5–5.1)
Sodium: 137 mmol/L (ref 135–145)

## 2017-07-13 LAB — I-STAT TROPONIN, ED
TROPONIN I, POC: 0 ng/mL (ref 0.00–0.08)
Troponin i, poc: 0 ng/mL (ref 0.00–0.08)

## 2017-07-13 MED ORDER — SODIUM CHLORIDE 0.9 % IV BOLUS (SEPSIS)
1000.0000 mL | Freq: Once | INTRAVENOUS | Status: AC
  Administered 2017-07-13: 1000 mL via INTRAVENOUS

## 2017-07-13 NOTE — ED Provider Notes (Signed)
[facility] DEPT Provider Note   CSN: [id number] Arrival date & time: 07/12/17  2349     History   Chief Complaint Chief Complaint  Patient presents with  . Chest Pain    HPI Courtney Hale is a 51 y.o. female.  The history is provided by the patient.  Chest Pain   This is a new problem. Episode onset: several hrs ago. The problem occurs constantly. The problem has been gradually worsening. The pain is moderate. The quality of the pain is described as pressure-like. The pain does not radiate. Associated symptoms include abdominal pain, diaphoresis, dizziness, nausea and shortness of breath. Pertinent negatives include no fever, no syncope and no vomiting. She has tried nothing for the symptoms.  Her past medical history is significant for hypertension.  Pertinent negatives for past medical history include no CAD and no PE.  pt reports while at work she had onset of chest pressure/SOB She has had this previously but denies any episodes in past week She is now improving She does not have an active job, mostly occurred at rest    She still reports generalized dizziness at this time   Past Medical History:  Diagnosis Date  . Complication of anesthesia    woke up during endoscopy and colonoscopy  . Depression   . Hemorrhoids   . History of esophageal stricture    2011  peptic w/  dilatation  . History of gastritis    2011  . History of melanoma excision    2015  left leg/   12-09-2015 back   . Hypertension   . Microhematuria   . Migraines   . Nephrolithiasis    left side non-obstructive   . Right ureteral stone   . Urgency of urination   . Wears contact lenses     Patient Active Problem List   Diagnosis Date Noted  . Morbid obesity ([place]) 05/27/2017  . Anxiety and depression 12/18/2016  . Venous stasis dermatitis of both lower extremities 06/09/2016  . Hernia of anterior abdominal wall 03/18/2016  . IBS (irritable bowel syndrome) 03/18/2016  . Melanoma of  skin ([place]) 03/18/2016  . Headache, chronic migraine without aura 01/31/2013  . Hypertension 01/31/2013    Past Surgical History:  Procedure Laterality Date  . ABDOMINAL HYSTERECTOMY    . CARPAL TUNNEL RELEASE Right 01-05- 2015  . COLONOSCOPY N/A 08/27/2015   Procedure: COLONOSCOPY;  Surgeon: Danie Binder, MD;  [facility];  Service: Endoscopy;  Laterality: N/A;  0830  . CYST EXCISION  2012     right hand  . CYSTOSCOPY W/ URETERAL STENT PLACEMENT Right 12/22/2015   Procedure: CYSTOSCOPY WITH RETROGRADE PYELOGRAM/URETERAL STENT PLACEMENT;  Surgeon: Nickie Retort, MD;  Location: [facility];  Service: Urology;  Laterality: Right;  . CYSTOSCOPY WITH RETROGRADE PYELOGRAM, URETEROSCOPY AND STENT PLACEMENT Right 01/05/2016   Procedure: CYSTOSCOPY WITH RETROGRADE PYELOGRAM, URETEROSCOPY AND STENT REPLACEMENT;  Surgeon: Nickie Retort, MD;  Location: [facility];  Service: Urology;  Laterality: Right;  . DILATION AND CURETTAGE OF UTERUS  03-12-2009   w/  Suction  . double balloon enteroscopy     Dr. Arsenio Loader at [facility]: no erosions, no evidence of Crohn's disease, path without Crohn's.   Marland Kitchen EGD with push enteroscopy  02-12-2010    patent distal peptic stricture with diffuse antral erythema, normal D1 and D2   . LAPAROSCOPIC ASSISTED VAGINAL HYSTERECTOMY  10-26-2014   w/  Bilateral Salpingoophorectomy  . STONE EXTRACTION WITH BASKET  Right 01/05/2016   Procedure: STONE EXTRACTION WITH BASKET;  Surgeon: Nickie Retort, MD;  Location: [facility];  Service: Urology;  Laterality: Right;  . TONSILLECTOMY  1998  approx    OB History    No data available       Home Medications    Prior to Admission medications   Medication Sig Start Date End Date Taking? Authorizing Provider  Cyanocobalamin (VITAMIN B-12) 2500 MCG SUBL Place 1 tablet under the tongue daily.   Yes [provider]  losartan (COZAAR) 100 MG tablet TAKE 1 TABLET BY MOUTH ONCE  DAILY 06/29/17  Yes [name], MD  naproxen (NAPROSYN) 500 MG tablet Take 1 tablet (500 mg total) by mouth 2 (two) times daily with a meal. Prn migraines 02/22/17  Yes Nilda Simmer, NP  PARoxetine (PAXIL) 40 MG tablet Take 1 tablet (40 mg total) by mouth every morning. 05/26/17  Yes Nilda Simmer, NP  rizatriptan (MAXALT-MLT) 10 MG disintegrating tablet Take 1 tablet (10 mg total) by mouth as needed for migraine. May repeat in 2 hours if needed. Max 2 per 24 hours 02/22/17  Yes Nilda Simmer, NP  verapamil (CALAN-SR) 180 MG CR tablet TAKE ONE TABLET BY MOUTH ONCE DAILY 02/28/17  Yes [name], MD  Calcium Polycarbophil (FIBER-CAPS PO) Take by mouth.    [provider]  phentermine (ADIPEX-P) 37.5 MG tablet Take 1 tablet (37.5 mg total) by mouth daily before breakfast. 05/26/17   Nilda Simmer, NP    Family History Family History  Problem Relation Age of Onset  . Diabetes Mother   . Colon cancer Neg Hx     Social History Social History  Substance Use Topics  . Smoking status: Never Smoker  . Smokeless tobacco: Never Used  . Alcohol use Yes     Comment: occassionally     Allergies   Topamax [topiramate]; Augmentin [amoxicillin-pot clavulanate]; and Sulfa antibiotics   Review of Systems Review of Systems  Constitutional: Positive for diaphoresis and fatigue. Negative for fever.  Respiratory: Positive for shortness of breath.   Cardiovascular: Positive for chest pain. Negative for syncope.  Gastrointestinal: Positive for abdominal pain and nausea. Negative for vomiting.  Neurological: Positive for dizziness.  All other systems reviewed and are negative.    Physical Exam Updated Vital Signs BP (!) 148/127   Pulse 97   Resp 14   Ht 1.613 m (5' 3.5")   Wt 78.5 kg (173 lb)   LMP 10/26/2014   SpO2 98%   BMI 30.16 kg/m   Physical Exam CONSTITUTIONAL: Well developed/well nourished HEAD: Normocephalic/atraumatic EYES: EOMI/PERRL ENMT:  Mucous membranes moist NECK: supple no meningeal signs SPINE/BACK:entire spine nontender CV: S1/S2 noted, no murmurs/rubs/gallops noted LUNGS: Lungs are clear to auscultation bilaterally, no apparent distress ABDOMEN: soft, nontender, no rebound or guarding, bowel sounds noted throughout abdomen GU:no cva tenderness NEURO: Pt is awake/alert/appropriate, moves all extremitiesx4.  No facial droop.   EXTREMITIES: pulses normal/equal, full ROM, no LE edema or tenderness SKIN: warm, color normal PSYCH: anxious  ED Treatments / Results  Labs (all labs ordered are listed, but only abnormal results are displayed) Labs Reviewed  BASIC METABOLIC PANEL - Abnormal; Notable for the following:       Result Value   Potassium 3.3 (*)    Glucose, Bld 135 (*)    Calcium 8.5 (*)    All other components within normal limits  CBC  I-STAT TROPONIN, ED  I-STAT TROPONIN,  ED    EKG  EKG Interpretation  Date/Time:  Tuesday July 12 2017 23:59:24 EDT Ventricular Rate:  82 PR Interval:    QRS Duration: 86 QT Interval:  394 QTC Calculation: 461 R Axis:   15 Text Interpretation:  Sinus rhythm Low voltage, precordial leads Confirmed by Ripley Fraise [phone]) on 07/13/2017 12:03:41 AM       Radiology Dg Chest 2 View  Result Date: 07/13/2017 CLINICAL DATA:  Mid chest pain and shortness of breath tonight. Two day history of cough. History of hypertension. EXAM: CHEST  2 VIEW COMPARISON:  10/10/2015 FINDINGS: The heart size and mediastinal contours are within normal limits. Both lungs are clear. The visualized skeletal structures are unremarkable. IMPRESSION: No active cardiopulmonary disease. Electronically Signed   By: Lucienne Capers M.D.   On: 07/13/2017 00:56    Procedures Procedures    Medications Ordered in ED Medications  sodium chloride 0.9 % bolus 1,000 mL (0 mLs Intravenous Stopped 07/13/17 0454)     Initial Impression / Assessment and Plan / ED Course  I have reviewed the  triage vital signs and the nursing notes.  Pertinent labs & imaging results that were available during my care of the patient were reviewed by me and considered in my medical decision making (see chart for details).     4:24 AM I doubt acute PE/Dissection at this time Low suspicion for ACS, EKG unchanged, will get repeat troponin Pt ambulated without difficulty   Pt improved No hypoxia/tachycardia to suggest PE Repeat troponin negative Strong suspicion for underlying anxiety contributing to this episode Pt feels well for d/c home Referred to PCP, would benefit from stress testing as outpatient We discussed strict ER return precautions Pt feels comfortable for d/c home   Final Clinical Impressions(s) / ED Diagnoses   Final diagnoses:  Precordial chest pain    New Prescriptions New Prescriptions   No medications on file     Ripley Fraise, MD 07/13/17 [phone]

## 2017-07-13 NOTE — Discharge Instructions (Signed)

## 2017-07-13 NOTE — ED Notes (Signed)
Pt walked around the nurses station without assistance and only small complaint of some dizziness; Dr. Christy Gentles informed; pt given diet coke

## 2017-07-13 NOTE — ED Triage Notes (Signed)
Pt states chest pains for the past few days got worse tonight. Pt says tingling down her right arm also.

## 2017-07-14 ENCOUNTER — Encounter: Payer: Self-pay | Admitting: Family Medicine

## 2017-07-14 ENCOUNTER — Telehealth: Payer: Self-pay | Admitting: Family Medicine

## 2017-07-14 ENCOUNTER — Ambulatory Visit (INDEPENDENT_AMBULATORY_CARE_PROVIDER_SITE_OTHER): Payer: BLUE CROSS/BLUE SHIELD | Admitting: Family Medicine

## 2017-07-14 VITALS — BP 126/84 | Ht 63.5 in | Wt 185.8 lb

## 2017-07-14 DIAGNOSIS — R0789 Other chest pain: Secondary | ICD-10-CM

## 2017-07-14 DIAGNOSIS — F419 Anxiety disorder, unspecified: Secondary | ICD-10-CM | POA: Diagnosis not present

## 2017-07-14 DIAGNOSIS — F329 Major depressive disorder, single episode, unspecified: Secondary | ICD-10-CM | POA: Diagnosis not present

## 2017-07-14 DIAGNOSIS — F32A Depression, unspecified: Secondary | ICD-10-CM

## 2017-07-14 DIAGNOSIS — I10 Essential (primary) hypertension: Secondary | ICD-10-CM | POA: Diagnosis not present

## 2017-07-14 MED ORDER — HYDROCHLOROTHIAZIDE 25 MG PO TABS: 25.0000 mg | ORAL_TABLET | Freq: Every day | ORAL | 5 refills | Status: DC

## 2017-07-14 MED ORDER — CLONAZEPAM 1 MG PO TABS: 0.5000 mg | ORAL_TABLET | Freq: Every day | ORAL | 1 refills | Status: DC | PRN

## 2017-07-14 NOTE — Progress Notes (Signed)
   Subjective:    Patient ID: Courtney Hale, female    DOB: [birth year], 51 y.o.   MRN: [id number]  HPI  Patient arrives for a follow up from a recent hospital visit for elevated blood pressure.  Discomfort at times with chest pressure and sometimes sob.Concerned of course with his family history of heart disease. Evaluate emergency room. Full ER visit grief reviewed. Negative cardiac enzymes. No specific EKG changes  Pt feels anxiou at times  Feels stressed these days  fam hx of high blood pressure, father  Ps fam hx of sudden MI and CAD with father, who had attempted heart transplantn    Review of Systems No headache, no major weight loss or weight gain, no chest pain no back pain abdominal pain no change in bowel habits complete ROS otherwise negative     Objective:   Physical Exam  Alert and oriented, vitals reviewed and stable, NAD ENT-TM's and ext canals WNL bilat via otoscopic exam Soft palate, tonsils and post pharynx WNL via oropharyngeal exam Neck-symmetric, no masses; thyroid nonpalpable and nontender Pulmonary-no tachypnea or accessory muscle use; Clear without wheezes via auscultation Card--no abnrml murmurs, rhythm reg and rate WNL Carotid pulses symmetric, without bruits       Assessment & Plan:  Impression hypertension suboptimal control discussed. Plan add hydrochlorothiazide to patient's losartan. Hopefully this will help control blood pressure.  #2 chest pain atypical in nature discussed good idea to see it through and have cardiology assess discussed  #3 chronic anxiety deftly factor patient's situation. Chronically stress but worse so recently. Medication just adjusted so will remain the same  Plan follow-up as noted above.

## 2017-07-14 NOTE — Telephone Encounter (Signed)
Pt was seen today and thought that the doctor was going to increase her Losartan but there Rx at the pharmacy for a higher dose  Also wanted him to know that she can't start the HCTZ until tomorrow due to the pharmacy didn't have enough to completely fill her Rx.    Please advise  [facility]

## 2017-07-14 NOTE — Telephone Encounter (Signed)
I told pt we could incr her verap a little , but that we really needed to add a new med.  I did nmot change losartan

## 2017-07-14 NOTE — Telephone Encounter (Signed)
Spoke with patient and informed her per Dr.Steve Luking- we increased verapamil a little but we really need to add a new medication. We did not change the losartan. Patient verbalized understanding.

## 2017-07-15 ENCOUNTER — Encounter: Payer: Self-pay | Admitting: Family Medicine

## 2017-07-27 ENCOUNTER — Ambulatory Visit (INDEPENDENT_AMBULATORY_CARE_PROVIDER_SITE_OTHER): Payer: BLUE CROSS/BLUE SHIELD | Admitting: Gastroenterology

## 2017-07-27 ENCOUNTER — Encounter: Payer: Self-pay | Admitting: Gastroenterology

## 2017-07-27 DIAGNOSIS — K581 Irritable bowel syndrome with constipation: Secondary | ICD-10-CM

## 2017-07-27 DIAGNOSIS — M6208 Separation of muscle (nontraumatic), other site: Secondary | ICD-10-CM

## 2017-07-27 DIAGNOSIS — K219 Gastro-esophageal reflux disease without esophagitis: Secondary | ICD-10-CM | POA: Insufficient documentation

## 2017-07-27 MED ORDER — PANTOPRAZOLE SODIUM 40 MG PO TBEC: DELAYED_RELEASE_TABLET | ORAL | 11 refills | Status: DC

## 2017-07-27 NOTE — Progress Notes (Signed)
Subjective:    Patient ID: Courtney Hale, female    DOB: Oct 25, 1966, 51 y.o.   MRN: [id number]  HPI Been doing antacid because something doesn't feel right. ZantAc hasn't been helping. Feeling nauseated. CONCERNED ABOUT ABDOMINAL PAIN/BULGE ABOVE UMBLICAL AREA. KNOWS SHE HAS A UMBILICAL HERNIA. HARDLY EATS AT WORK. DRINKING WATER INSTEAD OF SODA. TAKING ZANTAC 75 MG 1-2 X/DAY TO CONTROL PAIN IN UPPER ABDOMEN AND PAINS @[facility] . BEEN THROWING UP(NO BLOOD) AND ON AND OF NAUSEA.  MAY BE MIGRAINES AND MAY BE REFLUX. RARE BRBPR AND SAW SOME IN HER PANTIES(? HEMORRHOIDS) & ON TOP OF HER STOOL: 2-3X/MO). FEELING SOB WHEN BP ELEVATED. WAS SEEN IN ED FOR CHEST PAIN AND BP WAS ELEVATED. TAKE COLACE: 2-3X/WEEK. MAY FEEL LIKE SOLIDS OR LIQUIDS GETS STUCK IN HER THROAT.  PT DENIES FEVER, CHILLS, melena, diarrhea, CHANGE IN BOWEL IN HABITS, OR problems with sedation.  Past Medical History:  Diagnosis Date  . Complication of anesthesia    woke up during endoscopy and colonoscopy  . Depression   . Hemorrhoids   . History of esophageal stricture    2011  peptic w/  dilatation  . History of gastritis    2011  . History of melanoma excision    2015  left leg/   12-09-2015 back   . Hypertension   . Microhematuria   . Migraines   . Nephrolithiasis    left side non-obstructive   . Right ureteral stone   . Urgency of urination   . Wears contact lenses     Past Surgical History:  Procedure Laterality Date  . ABDOMINAL HYSTERECTOMY    . CARPAL TUNNEL RELEASE Right 01-05- 2015  . COLONOSCOPY N/A 08/27/2015   Procedure: COLONOSCOPY;  Surgeon: Danie Binder, MD;  [facility];  Service: Endoscopy;  Laterality: N/A;  0830  . CYST EXCISION  2012     right hand  . CYSTOSCOPY W/ URETERAL STENT PLACEMENT Right 12/22/2015   Procedure: CYSTOSCOPY WITH RETROGRADE PYELOGRAM/URETERAL STENT PLACEMENT;  Surgeon: Nickie Retort, MD;  Location: [facility];  Service: Urology;  Laterality: Right;  .  CYSTOSCOPY WITH RETROGRADE PYELOGRAM, URETEROSCOPY AND STENT PLACEMENT Right 01/05/2016   Procedure: CYSTOSCOPY WITH RETROGRADE PYELOGRAM, URETEROSCOPY AND STENT REPLACEMENT;  Surgeon: Nickie Retort, MD;  Location[facility];  Service: Urology;  Laterality: Right;  . DILATION AND CURETTAGE OF UTERUS  03-12-2009   w/  Suction  . double balloon enteroscopy     [facility]io [facility]er at [facility]: no erosions, no evidence of Crohn's disease, path without Crohn's.   Marland Kitchen EGD with push enteroscopy  02-12-2010    patent distal peptic stricture with diffuse antral erythema, normal D1 and D2   . LAPAROSCOPIC ASSISTED VAGINAL HYSTERECTOMY  10-26-2014   w/  Bilateral Salpingoophorectomy  . STONE EXTRACTION WITH BASKET Right 01/05/2016   Procedure: STONE EXTRACTION WITH BASKET;  Surgeon: Nickie Retort, MD;  Location: [facility];  Service: Urology;  Laterality: Right;  . TONSILLECTOMY  1998  approx    Allergies  Allergen Reactions  . Topamax [Topiramate] Other (See Comments)    Developed kidney stones  . Augmentin [Amoxicillin-Pot Clavulanate] Other (See Comments)    Gi upset  . Sulfa Antibiotics Other (See Comments)    Burning sensation, flushing to skin   Current Outpatient Prescriptions  Medication Sig Dispense Refill  . acetaminophen (TYLENOL) 500 MG tablet Take 500 mg by mouth every 6 (six) hours as needed. As needed    .  clonazePAM (KLONOPIN) 1 MG tablet Take 0.5-1 mg by mouth daily as needed for anxiety. Has on hand, but not taking yet)    . docusate sodium (COLACE) 100 MG capsule Take 100 mg by mouth 2 (two) times daily. As needed 2-3X/WK   . hydrochlorothiazide (HYDRODIURIL) 25 MG tablet Take 1 tablet (25 mg total) by mouth daily.    Marland Kitchen losartan (COZAAR) 100 MG tablet TAKE 1 TABLET BY MOUTH ONCE DAILY    . naproxen (NAPROSYN) 500 MG tablet Take 1 tablet (500 mg total) by mouth 2 (two) times daily with a meal. Prn migraines    . naproxen sodium (ANAPROX) 220 MG  tablet Take 220 mg by mouth 2 (two) times daily with a meal. Only as needed    . PARoxetine (PAXIL) 40 MG tablet Take 1 tablet (40 mg total) by mouth every morning.    . ranitidine (ZANTAC) 75 MG capsule Take 75 mg by mouth 2 (two) times daily. As needed    . rizatriptan (MAXALT-MLT) 10 MG disintegrating tablet Take 1 tablet (10 mg total) by mouth as needed for migraine. May repeat in 2 hours if needed. Max 2 per 24 hours    . verapamil (CALAN-SR) 180 MG CR tablet TAKE ONE TABLET BY MOUTH ONCE DAILY    . Calcium Polycarbophil (FIBER-CAPS PO) Take by mouth.    . Cyanocobalamin (VITAMIN B-12) 2500 MCG SUBL Place 1 tablet under the tongue daily.    .       Review of Systems PER HPI OTHERWISE ALL SYSTEMS ARE NEGATIVE.    Objective:   Physical Exam  Constitutional: She is oriented to person, place, and time. She appears well-developed and well-nourished. No distress.  HENT:  Head: Normocephalic and atraumatic.  Mouth/Throat: Oropharynx is clear and moist. No oropharyngeal exudate.  Eyes: Pupils are equal, round, and reactive to light. No scleral icterus.  Neck: Normal range of motion. Neck supple.  Cardiovascular: Normal rate, regular rhythm and normal heart sounds.   Pulmonary/Chest: Effort normal and breath sounds normal. No respiratory distress.  Abdominal: Soft. Bowel sounds are normal. She exhibits no distension. There is tenderness. There is no rebound and no guarding.  MILD TTP IN THE EPIGASTRIUM & BUQS. MIDLINE BULGE THAT INCREASES WITH VALSALVA, REDUCIBLE,UNABLE TO APPRECIATE UMBILICAL HERNIA, NON-TENDER UMBILICUS  Musculoskeletal: She exhibits no edema.  Lymphadenopathy:    She has no cervical adenopathy.  Neurological: She is alert and oriented to person, place, and time.  NO  NEW FOCAL DEFICITS  Psychiatric:  SLIGHTLY ANXIOUS MOOD, NL AFFECT  Vitals reviewed.     Assessment & Plan:

## 2017-07-27 NOTE — Assessment & Plan Note (Addendum)
SYMPTOMS NOT IDEALLY CONTROLLED WITH ZANTAC OTC 1X/DAY AND DUE TO BMI > 30 AND DIETARY CHOICES.  DRINK WATER TO KEEP YOUR URINE LIGHT YELLOW. CONTINUE YOUR WEIGHT LOSS EFFORTS. A WEIGHT OF 170 lbs   WILL GET YOUR BODY MASS INDEX(BMI) UNDER 30. WHILE I DO NOT WANT TO ALARM YOU, currently YOUR BODY MASS INDEX IS OVER 30 WHICH MEANS YOU ARE OBESE. OBESITY IS ASSOCIATED WITH AN INCREASED RISK FOR CIRRHOSIS AND ALL CANCERS, INCLUDING ESOPHAGEAL AND COLON CANCER. CONTINUE PROTONIX. TAKE 30 MINUTES PRIOR TO MEALS TWICE DAILY FOR 3 MOS THEN ONCE DAILY. ZANTAC HELPS MOST WHEN USED AS NEEDED.  FOLLOW UP IN 4 MOS.

## 2017-07-27 NOTE — Assessment & Plan Note (Signed)
SYMPTOMS FAIRLY WELL CONTROLLED WITH COLACE.  COLACE PRN. DRINK WATER EAT FIBER FOLLOW UP IN 4 MOS.

## 2017-07-27 NOTE — Assessment & Plan Note (Addendum)
EXPLAINED TO PT PHYSIOLOGY OF ANTERIOR WALL DEFECT WHICH IS NOT A TRUE HERNIA AND IS ASSOCIATED WITH TRUNCAL OBESITY.  DRINK WATER TO KEEP YOUR URINE LIGHT YELLOW. CONTINUE YOUR WEIGHT LOSS EFFORTS. A WEIGHT OF 170 lbs   WILL GET YOUR BODY MASS INDEX(BMI) UNDER 30. WHILE I DO NOT WANT TO ALARM YOU, currently YOUR BODY MASS INDEX IS OVER 30 WHICH MEANS YOU ARE OBESE. OBESITY IS ASSOCIATED WITH AN INCREASED RISK FOR CIRRHOSIS AND ALL CANCERS, INCLUDING ESOPHAGEAL AND COLON CANCER. DO PLANKS TO STRENGTHEN YOUR ABDOMINAL AND BACK MUSCLES: FULL PLANK 15 SECS REST 1 MIN-REPEAT 3 TIMES, SIDE PLANK FOR 15 SEC REST 1 MIN-REPEAT 3 TIMES ON RIGHT AND LEFT SIDE, AND HALF PLANK FOR 15 SECS-REST 1 MIN. REPEAT 3 TIMES. EXERCISES DEMONSTRATED IN THE OFFICE.  CALL WITH QUESTIONS OR CONCERNS. FOLLOW UP IN 4 MOS.

## 2017-07-27 NOTE — Progress Notes (Signed)
cc'ed to pcp °

## 2017-07-27 NOTE — Patient Instructions (Addendum)
DRINK WATER TO KEEP YOUR URINE LIGHT YELLOW.  CONTINUE YOUR WEIGHT LOSS EFFORTS. A WEIGHT OF 170 lbs  WILL GET YOUR BODY MASS INDEX(BMI) UNDER 30. WHILE I DO NOT WANT TO ALARM YOU, currently YOUR BODY MASS INDEX IS OVER 30 WHICH MEANS YOU ARE OBESE. OBESITY IS ASSOCIATED WITH AN INCREASED RISK FOR CIRRHOSIS AND ALL CANCERS, INCLUDING ESOPHAGEAL AND COLON CANCER.   DO PLANKS TO STRENGTHEN YOUR ABDOMINAL AND BACK MUSCLES: FULL PLANK 15 SECS REST 1 MIN-REPEAT 3 TIMES, SIDE PLANK FOR 15 SEC REST 1 MIN-REPEAT 3 TIMES ON RIGHT AND LEFT SIDE, AND HALF PLANK FOR 15 SECS-REST 1 MIN. REPEAT 3 TIMES.  AVOID REFLUX TRIGGERS. SEE INFO BELOW.  START PROTONIX. TAKE 30 MINUTES PRIOR TO MEALS TWICE DAILY FOR 3 MOS THEN ONCE DAILY.  ZANTAC HELPS MOST WHEN USED AS NEEDED.  FOLLOW UP IN 4 MOS.    Lifestyle and home remedies TO MANAGE REFLUX/CHEST PAIN  You may eliminate or reduce the frequency of heartburn by making the following lifestyle changes:  . Control your weight. Being overweight is a major risk factor for heartburn and GERD. Excess pounds put pressure on your abdomen, pushing up your stomach and causing acid to back up into your esophagus.   . Eat smaller meals. 4 TO 6 MEALS A DAY. This reduces pressure on the lower esophageal sphincter, helping to prevent the valve from opening and acid from washing back into your esophagus.   Dolphus Jenny your belt. Clothes that fit tightly around your waist put pressure on your abdomen and the lower esophageal sphincter.   . Eliminate heartburn triggers. Everyone has specific triggers. Common triggers such as fatty or fried foods, spicy food, tomato sauce, carbonated beverages, alcohol, chocolate, mint, garlic, onion, caffeine and nicotine may make heartburn worse.   Marland Kitchen Avoid stooping or bending. Tying your shoes is OK. Bending over for longer periods to weed your garden isn't, especially soon after eating.   . Don't lie down after a meal. Wait at least three to  four hours after eating before going to bed, and don't lie down right after eating.   Marland Kitchen PUT THE HEAD OF YOUR BED ON 6 INCH BLOCKS.   Alternative medicine . Several home remedies exist for treating GERD, but they provide only temporary relief. They include drinking baking soda (sodium bicarbonate) added to water or drinking other fluids such as baking soda mixed with cream of tartar and water.  . Although these liquids create temporary relief by neutralizing, washing away or buffering acids, eventually they aggravate the situation by adding gas and fluid to your stomach, increasing pressure and causing more acid reflux. Further, adding more sodium to your diet may increase your blood pressure and add stress to your heart, and excessive bicarbonate ingestion can alter the acid-base balance in your body.

## 2017-07-28 NOTE — Progress Notes (Signed)
On recall  °

## 2017-07-29 ENCOUNTER — Other Ambulatory Visit: Payer: Self-pay | Admitting: Family Medicine

## 2017-07-29 NOTE — Progress Notes (Signed)
Cardiology Office Note   Date:  08/02/2017   ID:  Courtney Hale, DOB 09-10-1966, MRN [id number]  PCP:  [name], MD  Cardiologist:   Jenkins Rouge, MD   No chief complaint on file.     History of Present Illness: Courtney Hale is a 51 y.o. female who presents for consultation regarding chest pain. Referred by Dr Wolfgang Phoenix BP has been labile lately verapamil increased, diuretic added and on ARB. Reviewed primary office note from 07/14/17 Occasional discomfort in chest and dyspnea Father had premature CAD and evaluated for heart transplant. His notes Indicated chronic anxiety. Pressure in chest sporadic. Pain mostly at rest. Some associated dizziness and abdominal pain  Does have history of esophageal stricture, PUD and gastritis. D/c from ER negative troponin x 2   She has some exertional dyspnea and LE edema started on diuretic. Continues to have pain sensation in center of Chest not related exertion, position or breathing   Past Medical History:  Diagnosis Date  . Complication of anesthesia    woke up during endoscopy and colonoscopy  . Depression   . Hemorrhoids   . History of esophageal stricture    2011  peptic w/  dilatation  . History of gastritis    2011  . History of melanoma excision    2015  left leg/   12-09-2015 back   . Hypertension   . Microhematuria   . Migraines   . Nephrolithiasis    left side non-obstructive   . Right ureteral stone   . Urgency of urination   . Wears contact lenses     Past Surgical History:  Procedure Laterality Date  . ABDOMINAL HYSTERECTOMY    . CARPAL TUNNEL RELEASE Right 01-05- 2015  . COLONOSCOPY N/A 08/27/2015   Procedure: COLONOSCOPY;  Surgeon: Danie Binder, MD;  [facility];  Service: Endoscopy;  Laterality: N/A;  0830  . CYST EXCISION  2012     right hand  . CYSTOSCOPY W/ URETERAL STENT PLACEMENT Right 12/22/2015   Procedure: CYSTOSCOPY WITH RETROGRADE PYELOGRAM/URETERAL STENT PLACEMENT;   Surgeon: Nickie Retort, MD;  Location: [facility];  Service: Urology;  Laterality: Right;  . CYSTOSCOPY WITH RETROGRADE PYELOGRAM, URETEROSCOPY AND STENT PLACEMENT Right 01/05/2016   Procedure: CYSTOSCOPY WITH RETROGRADE PYELOGRAM, URETEROSCOPY AND STENT REPLACEMENT;  Surgeon: Nickie Retort, MD;  Location: [facility];  Service: Urology;  Laterality: Right;  . DILATION AND CURETTAGE OF UTERUS  03-12-2009   w/  Suction  . double balloon enteroscopy     Dr. Arsenio Loader at [facility]: no erosions, no evidence of Crohn's disease, path without Crohn's.   Marland Kitchen EGD with push enteroscopy  02-12-2010    patent distal peptic stricture with diffuse antral erythema, normal D1 and D2   . LAPAROSCOPIC ASSISTED VAGINAL HYSTERECTOMY  10-26-2014   w/  Bilateral Salpingoophorectomy  . STONE EXTRACTION WITH BASKET Right 01/05/2016   Procedure: STONE EXTRACTION WITH BASKET;  Surgeon: Nickie Retort, MD;  Location: [facility];  Service: Urology;  Laterality: Right;  . TONSILLECTOMY  1998  approx     Current Outpatient Prescriptions  Medication Sig Dispense Refill  . acetaminophen (TYLENOL) 500 MG tablet Take 500 mg by mouth every 6 (six) hours as needed. As needed    . clonazePAM (KLONOPIN) 1 MG tablet Take 0.5-1 tablets (0.5-1 mg total) by mouth daily as needed for anxiety. (Patient taking differently: Take 0.5-1 mg by mouth daily as needed for  anxiety. Has on hand, but not taking yet) 24 tablet 1  . losartan (COZAAR) 100 MG tablet TAKE 1 TABLET BY MOUTH ONCE DAILY 30 tablet 5  . naproxen (NAPROSYN) 500 MG tablet Take 1 tablet (500 mg total) by mouth 2 (two) times daily with a meal. Prn migraines 30 tablet 0  . naproxen sodium (ANAPROX) 220 MG tablet Take 220 mg by mouth 2 (two) times daily with a meal. Only as needed    . PARoxetine (PAXIL) 40 MG tablet Take 1 tablet (40 mg total) by mouth every morning. 90 tablet 1  . ranitidine (ZANTAC) 150 MG capsule Take 150 mg by mouth 2 (two)  times daily. As needed    . rizatriptan (MAXALT-MLT) 10 MG disintegrating tablet Take 1 tablet (10 mg total) by mouth as needed for migraine. May repeat in 2 hours if needed. Max 2 per 24 hours 10 tablet 5  . verapamil (CALAN-SR) 180 MG CR tablet TAKE 1 TABLET BY MOUTH ONCE DAILY 30 tablet 5   No current facility-administered medications for this visit.     Allergies:   Topamax [topiramate]; Augmentin [amoxicillin-pot clavulanate]; and Sulfa antibiotics    Social History:  The patient  reports that she has never smoked. She has never used smokeless tobacco. She reports that she drinks alcohol. She reports that she does not use drugs.   Family History:  The patient's family history includes Diabetes in her mother.    ROS:  Please see the history of present illness.   Otherwise, review of systems are positive for none.   All other systems are reviewed and negative.    PHYSICAL EXAM: VS:  BP 118/80 (BP Location: Right Arm)   Pulse 93   Ht 5' 4.5" (1.638 m)   Wt 181 lb (82.1 kg)   LMP 10/26/2014   SpO2 98%   BMI 30.59 kg/m  , BMI Body mass index is 30.59 kg/m. Affect appropriate Healthy:  appears stated age 89: normal Neck supple with no adenopathy JVP normal no bruits no thyromegaly Lungs clear with no wheezing and good diaphragmatic motion Heart:  S1/S2 no murmur, no rub, gallop or click PMI normal Abdomen: benighn, BS positve, no tenderness, no AAA no bruit.  No HSM or HJR Distal pulses intact with no bruits No edema Neuro non-focal Skin warm and dry No muscular weakness    EKG:  07/03/17 SR rate 81 low voltage    Recent Labs: 07/13/2017: BUN 11; Creatinine, Ser 0.55; Hemoglobin 12.7; Platelets 252; Potassium 3.3; Sodium 137    Lipid Panel No results found for: CHOL, TRIG, HDL, CHOLHDL, VLDL, LDLCALC, LDLDIRECT    Wt Readings from Last 3 Encounters:  08/02/17 181 lb (82.1 kg)  07/27/17 178 lb (80.7 kg)  07/14/17 185 lb 12.8 oz (84.3 kg)      Other  studies Reviewed: Additional studies/ records that were reviewed today include: Notes Dr Wolfgang Phoenix ER visit 9/18 ECG, CXR and labs .    ASSESSMENT AND PLAN:  1.  Chest Pain atypical r/o in ER ECG ok f/u exercise myovue 2. HTN: Well controlled.  Continue current medications and low sodium Dash type diet.   3. Anxiety seems compensated  4. PUD f/u Dr Oneida Alar no plans for f/u EGD BCBS denied med change continue ranitidine 5. Dyspnea/Edema: f/u echo to assess RV/LV function    Current medicines are reviewed at length with the patient today.  The patient does not have concerns regarding medicines.  The following changes have been  made:  no change  Labs/ tests ordered today include: Ex Myovue and Echo  No orders of the defined types were placed in this encounter.    Disposition:   FU with cardiology PRN      Signed, Jenkins Rouge, MD  08/02/2017 1:55 PM    [facility] [address], [place], [place]  [address] Phone: [phone]; Fax: [phone]

## 2017-08-02 ENCOUNTER — Other Ambulatory Visit: Payer: Self-pay | Admitting: Nurse Practitioner

## 2017-08-02 ENCOUNTER — Encounter: Payer: Self-pay | Admitting: Cardiovascular Disease

## 2017-08-02 ENCOUNTER — Ambulatory Visit (INDEPENDENT_AMBULATORY_CARE_PROVIDER_SITE_OTHER): Payer: BLUE CROSS/BLUE SHIELD | Admitting: Cardiovascular Disease

## 2017-08-02 VITALS — BP 118/80 | HR 93 | Ht 64.5 in | Wt 181.0 lb

## 2017-08-02 DIAGNOSIS — R0602 Shortness of breath: Secondary | ICD-10-CM | POA: Diagnosis not present

## 2017-08-02 DIAGNOSIS — R079 Chest pain, unspecified: Secondary | ICD-10-CM

## 2017-08-02 DIAGNOSIS — R609 Edema, unspecified: Secondary | ICD-10-CM | POA: Diagnosis not present

## 2017-08-02 NOTE — Patient Instructions (Signed)
Medication Instructions:  Your physician recommends that you continue on your current medications as directed. Please refer to the Current Medication list given to you today.   Labwork: NONE  Testing/Procedures: Your physician has requested that you have an echocardiogram. Echocardiography is a painless test that uses sound waves to create images of your heart. It provides your doctor with information about the size and shape of your heart and how well your heart's chambers and valves are working. This procedure takes approximately one hour. There are no restrictions for this procedure.  Your physician has requested that you have en exercise stress myoview. For further information please visit [email or web address]. Please follow instruction sheet, as given.      Follow-Up: Your physician recommends that you schedule a follow-up appointment in: AS NEEDED    Any Other Special Instructions Will Be Listed Below (If Applicable).     If you need a refill on your cardiac medications before your next appointment, please call your pharmacy.

## 2017-08-11 ENCOUNTER — Ambulatory Visit (HOSPITAL_COMMUNITY)
Admission: RE | Admit: 2017-08-11 | Discharge: 2017-08-11 | Disposition: A | Discharge status: Home / Self Care | Payer: BLUE CROSS/BLUE SHIELD | Source: Ambulatory Visit | Attending: Family Medicine | Admitting: Family Medicine

## 2017-08-11 ENCOUNTER — Encounter (HOSPITAL_COMMUNITY)
Admission: RE | Admit: 2017-08-11 | Discharge: 2017-08-11 | Disposition: A | Discharge status: Home / Self Care | Payer: BLUE CROSS/BLUE SHIELD | Source: Ambulatory Visit | Attending: Cardiovascular Disease | Admitting: Cardiovascular Disease

## 2017-08-11 ENCOUNTER — Encounter (HOSPITAL_COMMUNITY): Payer: Self-pay

## 2017-08-11 ENCOUNTER — Ambulatory Visit (HOSPITAL_COMMUNITY)
Admission: RE | Admit: 2017-08-11 | Discharge: 2017-08-11 | Disposition: A | Discharge status: Home / Self Care | Payer: BLUE CROSS/BLUE SHIELD | Source: Ambulatory Visit | Attending: Cardiovascular Disease | Admitting: Cardiovascular Disease

## 2017-08-11 DIAGNOSIS — R079 Chest pain, unspecified: Secondary | ICD-10-CM | POA: Diagnosis not present

## 2017-08-11 DIAGNOSIS — R609 Edema, unspecified: Secondary | ICD-10-CM

## 2017-08-11 DIAGNOSIS — R0602 Shortness of breath: Secondary | ICD-10-CM | POA: Diagnosis present

## 2017-08-11 DIAGNOSIS — I10 Essential (primary) hypertension: Secondary | ICD-10-CM | POA: Diagnosis not present

## 2017-08-11 LAB — ECHOCARDIOGRAM COMPLETE
CHL CUP DOP CALC LVOT VTI: 25.6 cm
CHL CUP MV DEC (S): 222
E decel time: 222 msec
EERAT: 7.17
FS: 31 % (ref 28–44)
IVS/LV PW RATIO, ED: 1.07
LA diam end sys: 29 mm
LA diam index: 1.48 cm/m2
LA vol A4C: 30 ml
LA vol: 32.6 mL
LASIZE: 29 mm
LAVOLIN: 16.6 mL/m2
LV PW d: 9.18 mm — AB (ref 0.6–1.1)
LV TDI E'MEDIAL: 7.62
LV dias vol index: 35 mL/m2
LVDIAVOL: 68 mL (ref 46–106)
LVEEAVG: 7.17
LVEEMED: 7.17
LVELAT: 10.9 cm/s
LVOT area: 2.84 cm2
LVOT peak grad rest: 6 mmHg
LVOTD: 19 mm
LVOTPV: 124 cm/s
LVOTSV: 73 mL
LVSYSVOL: 30 mL
LVSYSVOLIN: 15 mL/m2
Lateral S' vel: 12.9 cm/s
MV Peak grad: 2 mmHg
MV pk A vel: 76.1 m/s
MV pk E vel: 78.2 m/s
Simpson's disk: 57
Stroke v: 39 ml
TAPSE: 16.7 mm
TDI e' lateral: 10.9

## 2017-08-11 LAB — NM MYOCAR MULTI W/SPECT W/WALL MOTION / EF
CHL CUP MPHR: 171 {beats}/min
CHL CUP RESTING HR STRESS: 77 {beats}/min
CHL RATE OF PERCEIVED EXERTION: 17
CSEPEDS: 0 s
CSEPPHR: 150 {beats}/min
Estimated workload: 10.1 METS
Exercise duration (min): 9 min
LVDIAVOL: 58 mL (ref 46–106)
LVSYSVOL: 16 mL
Percent HR: 87 %
RATE: 0.46
SDS: 0
SRS: 0
SSS: 0
TID: 1.23

## 2017-08-11 MED ORDER — TECHNETIUM TC 99M TETROFOSMIN IV KIT
10.0000 | PACK | Freq: Once | INTRAVENOUS | Status: AC | PRN
  Administered 2017-08-11: 10 via INTRAVENOUS

## 2017-08-11 MED ORDER — SODIUM CHLORIDE 0.9% FLUSH
INTRAVENOUS | Status: AC
  Filled 2017-08-11: qty 10

## 2017-08-11 MED ORDER — TECHNETIUM TC 99M TETROFOSMIN IV KIT
30.0000 | PACK | Freq: Once | INTRAVENOUS | Status: AC | PRN
  Administered 2017-08-11: 33 via INTRAVENOUS

## 2017-08-11 MED ORDER — REGADENOSON 0.4 MG/5ML IV SOLN
INTRAVENOUS | Status: AC
  Filled 2017-08-11: qty 5

## 2017-08-11 NOTE — Progress Notes (Signed)
*  PRELIMINARY RESULTS* Echocardiogram 2D Echocardiogram has been performed.  Courtney Hale 08/11/2017, 10:56 AM

## 2017-08-17 ENCOUNTER — Telehealth: Payer: Self-pay | Admitting: *Deleted

## 2017-08-17 ENCOUNTER — Telehealth: Payer: Self-pay | Admitting: Cardiovascular Disease

## 2017-08-17 NOTE — Telephone Encounter (Signed)
Called patient with test results. No answer. Left message to call back.  

## 2017-08-17 NOTE — Telephone Encounter (Signed)
Called patient. No answer. Left message to call back.  

## 2017-08-17 NOTE — Telephone Encounter (Signed)
Pt lvm returning CC call from yesterday

## 2017-08-17 NOTE — Telephone Encounter (Signed)
-----   Message from Josue Hector, MD sent at 08/15/2017  5:35 PM EDT ----- Normal myovue study with no evidence of ischemia or infarction

## 2017-08-17 NOTE — Telephone Encounter (Signed)
Spoke with pt. Gave her test results. She voiced understanding.

## 2017-08-26 ENCOUNTER — Ambulatory Visit: Payer: BLUE CROSS/BLUE SHIELD | Admitting: Nurse Practitioner

## 2017-08-29 ENCOUNTER — Encounter: Payer: Self-pay | Admitting: Nurse Practitioner

## 2017-08-29 ENCOUNTER — Ambulatory Visit (INDEPENDENT_AMBULATORY_CARE_PROVIDER_SITE_OTHER): Payer: BLUE CROSS/BLUE SHIELD | Admitting: Nurse Practitioner

## 2017-08-29 VITALS — BP 128/82 | Ht 63.5 in | Wt 181.4 lb

## 2017-08-29 DIAGNOSIS — F329 Major depressive disorder, single episode, unspecified: Secondary | ICD-10-CM | POA: Diagnosis not present

## 2017-08-29 DIAGNOSIS — F5102 Adjustment insomnia: Secondary | ICD-10-CM | POA: Diagnosis not present

## 2017-08-29 DIAGNOSIS — F32A Depression, unspecified: Secondary | ICD-10-CM

## 2017-08-29 DIAGNOSIS — Z23 Encounter for immunization: Secondary | ICD-10-CM | POA: Diagnosis not present

## 2017-08-29 DIAGNOSIS — F419 Anxiety disorder, unspecified: Secondary | ICD-10-CM | POA: Diagnosis not present

## 2017-08-29 DIAGNOSIS — R252 Cramp and spasm: Secondary | ICD-10-CM | POA: Diagnosis not present

## 2017-08-29 DIAGNOSIS — Z79899 Other long term (current) drug therapy: Secondary | ICD-10-CM | POA: Diagnosis not present

## 2017-08-29 DIAGNOSIS — I10 Essential (primary) hypertension: Secondary | ICD-10-CM

## 2017-08-29 DIAGNOSIS — Z13220 Encounter for screening for lipoid disorders: Secondary | ICD-10-CM

## 2017-08-29 NOTE — Progress Notes (Signed)
Subjective: Presents for recheck on her anxiety/depression and insomnia.  Continues to work third shift which has a great impact on her sleep as well as her emotions.  Can only sleep for about 2 hours at a time on the days that she works.  Depression has been stable overall.  Has Klonopin for breakthrough anxiety symptoms but has not used this.  Complaints of continued fatigue.  Now also having intermittent migratory generalized muscle cramps/spasms.  Had a history of this when she was much younger.  Was prescribed medication at that time which helped, unsure of the name.  Doing well on Paxil 40 mg.   Objective:   BP 128/82   Ht 5' 3.5" (1.613 m)   Wt 181 lb 6.4 oz (82.3 kg)   LMP 10/26/2014   BMI 31.63 kg/m  NAD.  Alert, oriented.  Cheerful.  Mildly anxious affect.  Thoughts logical coherent and relevant.  Dressed appropriately.  Making good eye contact.  Lungs clear.  Heart regular rate and rhythm.  Carotids no bruits or thrills.  Assessment:   Problem List Items Addressed This Visit      Cardiovascular and Mediastinum   Hypertension   Relevant Orders   Basic metabolic panel   Magnesium     Other   Anxiety and depression - Primary    Other Visit Diagnoses    Need for vaccination       Relevant Orders   Flu Vaccine QUAD 36+ mos IM (Completed)   Adjustment insomnia       Cramps, muscle, general       Lipid screening       Relevant Orders   Lipid panel   High risk medication use       Relevant Orders   Hepatic function panel       Plan: Continue current medications as directed.  Use Klonopin as directed in the evening for sleep as needed.  Lab work pending.  Discussed importance of stress reduction and adequate sleep. Return in about 4 months (around 12/27/2017) for recheck.

## 2017-09-14 LAB — LIPID PANEL
Chol/HDL Ratio: 5.2 ratio — ABNORMAL HIGH (ref 0.0–4.4)
Cholesterol, Total: 240 mg/dL — ABNORMAL HIGH (ref 100–199)
HDL: 46 mg/dL (ref >39)
LDL Calculated: 158 mg/dL — ABNORMAL HIGH (ref 0–99)
TRIGLYCERIDES: 178 mg/dL — AB (ref 0–149)
VLDL Cholesterol Cal: 36 mg/dL (ref 5–40)

## 2017-09-14 LAB — MAGNESIUM: MAGNESIUM: 1.9 mg/dL (ref 1.6–2.3)

## 2017-09-14 LAB — BASIC METABOLIC PANEL
BUN/Creatinine Ratio: 19 (ref 9–23)
BUN: 12 mg/dL (ref 6–24)
CALCIUM: 9.5 mg/dL (ref 8.7–10.2)
CHLORIDE: 100 mmol/L (ref 96–106)
CO2: 25 mmol/L (ref 20–29)
Creatinine, Ser: 0.62 mg/dL (ref 0.57–1.00)
GFR calc Af Amer: 122 mL/min/{1.73_m2} (ref >59)
GFR calc non Af Amer: 106 mL/min/{1.73_m2} (ref >59)
Glucose: 106 mg/dL — ABNORMAL HIGH (ref 65–99)
POTASSIUM: 4 mmol/L (ref 3.5–5.2)
SODIUM: 140 mmol/L (ref 134–144)

## 2017-09-14 LAB — HEPATIC FUNCTION PANEL
ALK PHOS: 111 IU/L (ref 39–117)
ALT: 68 IU/L — ABNORMAL HIGH (ref 0–32)
AST: 45 IU/L — ABNORMAL HIGH (ref 0–40)
Albumin: 4.4 g/dL (ref 3.5–5.5)
BILIRUBIN TOTAL: 0.2 mg/dL (ref 0.0–1.2)
BILIRUBIN, DIRECT: 0.1 mg/dL (ref 0.00–0.40)
TOTAL PROTEIN: 6.8 g/dL (ref 6.0–8.5)

## 2017-10-27 ENCOUNTER — Encounter: Payer: Self-pay | Admitting: Gastroenterology

## 2017-11-20 ENCOUNTER — Other Ambulatory Visit: Payer: Self-pay | Admitting: Nurse Practitioner

## 2017-11-22 ENCOUNTER — Encounter: Payer: Self-pay | Admitting: Family Medicine

## 2017-12-14 ENCOUNTER — Ambulatory Visit (INDEPENDENT_AMBULATORY_CARE_PROVIDER_SITE_OTHER): Payer: BLUE CROSS/BLUE SHIELD | Admitting: Family Medicine

## 2017-12-14 ENCOUNTER — Encounter: Payer: Self-pay | Admitting: Family Medicine

## 2017-12-14 VITALS — BP 128/86 | Ht 63.5 in | Wt 193.4 lb

## 2017-12-14 DIAGNOSIS — M25572 Pain in left ankle and joints of left foot: Secondary | ICD-10-CM | POA: Diagnosis not present

## 2017-12-14 MED ORDER — NAPROXEN 500 MG PO TABS: ORAL_TABLET | ORAL | 1 refills | Status: DC

## 2017-12-14 NOTE — Progress Notes (Signed)
   Subjective:    Patient ID: Courtney Hale, female    DOB: 1966/04/19, 52 y.o.   MRN: [id number]  HPI  Patient arrives with c/o left ankle pain- knot in area. Patient relates that both ankles have a swollen area on the outer aspect this bothers her over the past few days very painful to walk on her left ankle she denies any injury to it never had this problem for no foot pain denies knee pain denies hip pain.  No sciatica issues.  PMH benign. Review of Systems    Negative for headaches chest pain shortness breath nausea vomiting diarrhea negative for hip pain sciatica.  Positive for ankle pain Objective:   Physical Exam Lungs clear respiratory rate normal heart regular no murmurs extremities no edema she does have a fat pad on both ankles on the lateral aspect with tenderness in the left ankle good range of motion of the ankle no signs of cyanosis       Assessment & Plan:  Tendinitis possible arthritic changes recommend the patient do anti-inflammatories over the next 2 weeks if not seeing significant improvement may need intervention with orthopedics patient will let us know.  Work excuse given for the next few days

## 2017-12-19 ENCOUNTER — Encounter: Payer: Self-pay | Admitting: Orthopedic Surgery

## 2017-12-19 ENCOUNTER — Ambulatory Visit (INDEPENDENT_AMBULATORY_CARE_PROVIDER_SITE_OTHER): Payer: BLUE CROSS/BLUE SHIELD

## 2017-12-19 ENCOUNTER — Ambulatory Visit (INDEPENDENT_AMBULATORY_CARE_PROVIDER_SITE_OTHER): Payer: BLUE CROSS/BLUE SHIELD | Admitting: Orthopedic Surgery

## 2017-12-19 VITALS — BP 117/79 | HR 105 | Ht 63.0 in | Wt 192.0 lb

## 2017-12-19 DIAGNOSIS — M19049 Primary osteoarthritis, unspecified hand: Secondary | ICD-10-CM

## 2017-12-19 DIAGNOSIS — M25572 Pain in left ankle and joints of left foot: Secondary | ICD-10-CM

## 2017-12-19 NOTE — Progress Notes (Signed)
NEW PATIENT OFFICE VISIT    Chief Complaint  Patient presents with  . Ankle Pain    Left ankle pain, no injury.    52 year old female comes in for evaluation of left ankle pain and swelling  She is 52 years old she stands all day she complains of dull aching pain left ankle lateral side with mass for several months cyst probably, pain for the last 3 weeks swelling painful weightbearing she is changed her shoes    Review of Systems  Constitutional: Negative for fever.  Neurological: Negative for tingling, tremors and sensory change.     Past Medical History:  Diagnosis Date  . Complication of anesthesia    woke up during endoscopy and colonoscopy  . Depression   . Hemorrhoids   . History of esophageal stricture    2011  peptic w/  dilatation  . History of gastritis    2011  . History of melanoma excision    2015  left leg/   12-09-2015 back   . Hypertension   . Microhematuria   . Migraines   . Nephrolithiasis    left side non-obstructive   . Right ureteral stone   . Urgency of urination   . Wears contact lenses     Past Surgical History:  Procedure Laterality Date  . ABDOMINAL HYSTERECTOMY    . CARPAL TUNNEL RELEASE Right 01-05- 2015  . COLONOSCOPY N/A 08/27/2015   Procedure: COLONOSCOPY;  Surgeon: Danie Binder, MD;  [facility];  Service: Endoscopy;  Laterality: N/A;  0830  . CYST EXCISION  2012     right hand  . CYSTOSCOPY W/ URETERAL STENT PLACEMENT Right 12/22/2015   Procedure: CYSTOSCOPY WITH RETROGRADE PYELOGRAM/URETERAL STENT PLACEMENT;  Surgeon: Nickie Retort, MD;  Location: [facility];  Service: Urology;  Laterality: Right;  . CYSTOSCOPY WITH RETROGRADE PYELOGRAM, URETEROSCOPY AND STENT PLACEMENT Right 01/05/2016   Procedure: CYSTOSCOPY WITH RETROGRADE PYELOGRAM, URETEROSCOPY AND STENT REPLACEMENT;  Surgeon: Nickie Retort, MD;  Location: [facility];  Service: Urology;  Laterality: Right;  . DILATION AND CURETTAGE OF  UTERUS  03-12-2009   w/  Suction  . double balloon enteroscopy     Dr. Arsenio Loader at [facility]: no erosions, no evidence of Crohn's disease, path without Crohn's.   Marland Kitchen EGD with push enteroscopy  02-12-2010    patent distal peptic stricture with diffuse antral erythema, normal D1 and D2   . LAPAROSCOPIC ASSISTED VAGINAL HYSTERECTOMY  10-26-2014   w/  Bilateral Salpingoophorectomy  . STONE EXTRACTION WITH BASKET Right 01/05/2016   Procedure: STONE EXTRACTION WITH BASKET;  Surgeon: Nickie Retort, MD;  Location: [facility];  Service: Urology;  Laterality: Right;  . TONSILLECTOMY  1998  approx    Family History  Problem Relation Age of Onset  . Diabetes Mother   . Colon cancer Neg Hx    Social History   Tobacco Use  . Smoking status: Never Smoker  . Smokeless tobacco: Never Used  Substance Use Topics  . Alcohol use: Yes    Comment: occassionally  . Drug use: No    No outpatient medications have been marked as taking for the 12/19/17 encounter (Office Visit) with Carole Civil, MD.    BP 117/79   Pulse (!) 105   Ht 5\' 3"  (1.6 m)   Wt 192 lb (87.1 kg)   LMP 10/26/2014   BMI 34.01 kg/m   Physical Exam  Constitutional: She is oriented to person, place,  and time. She appears well-developed and well-nourished.  Neurological: She is alert and oriented to person, place, and time. Gait normal.  Psychiatric: She has a normal mood and affect. Judgment normal.  Vitals reviewed.   Ortho Exam   Bilateral ankle exam  Right left ankle inspection reveals no masses in the subtalar joint cyst.  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  . Pain of joint of left ankle and foot Yes  . [place] arthritis     Please see my dictated report the ankle joint was normal  We injected the subtalar area of the left ankle  Verbal consent and timeout were taken to complete the following injection, A steroid injection was performed at left subtalar joint using 1% plain Lidocaine and 40 mg of Depo-Medrol  This was well tolerated without complication . This was well tolerated. PLAN:  Return in 4 weeks continue anti-inflammatory medication  Encounter Diagnoses  Name Primary?  . Pain of joint of left ankle and foot Yes  . CMC arthritis

## 2017-12-22 ENCOUNTER — Ambulatory Visit (INDEPENDENT_AMBULATORY_CARE_PROVIDER_SITE_OTHER): Payer: BLUE CROSS/BLUE SHIELD | Admitting: Family Medicine

## 2017-12-22 ENCOUNTER — Encounter: Payer: Self-pay | Admitting: Family Medicine

## 2017-12-22 VITALS — BP 126/82 | Temp 98.7°F | Ht 63.0 in | Wt 194.2 lb

## 2017-12-22 DIAGNOSIS — J329 Chronic sinusitis, unspecified: Secondary | ICD-10-CM | POA: Diagnosis not present

## 2017-12-22 MED ORDER — CEFDINIR 300 MG PO CAPS
300.0000 mg | ORAL_CAPSULE | Freq: Two times a day (BID) | ORAL | 0 refills | Status: DC
Start: 2017-12-22 — End: 2018-02-06

## 2017-12-22 MED ORDER — BENZONATATE 100 MG PO CAPS: 100.0000 mg | ORAL_CAPSULE | Freq: Four times a day (QID) | ORAL | 0 refills | Status: DC | PRN

## 2017-12-22 NOTE — Progress Notes (Signed)
   Subjective:    Patient ID: TIOMBE TOMEO, female    DOB: Mar 03, 1966, 52 y.o.   MRN: [id number]  Cough  This is a new problem. The current episode started in the past 7 days. Associated symptoms include ear pain, headaches, nasal congestion and a sore throat. She has tried OTC cough suppressant (tylenol) for the symptoms.   Throat hurting some over past several days  Hit hard two d agot   Frontal heache , non smoker   Cough started 48 hrs ago, got worse  Nose now all gunky   Blood disch   coug prod uctive bu   Review of Systems  HENT: Positive for ear pain and sore throat.   Respiratory: Positive for cough.   Neurological: Positive for headaches.       Objective:   Physical Exam  Alert, mild malaise. Hydration good Vitals stable. frontal/ maxillary tenderness evident positive nasal congestion. pharynx normal neck supple  lungs clear/no crackles or wheezes. heart regular in rhythm       Assessment & Plan:  Impression rhinosinusitis likely post viral, discussed with patient. plan antibiotics prescribed. Questions answered. Symptomatic care discussed. warning signs discussed. WSL

## 2017-12-23 ENCOUNTER — Encounter: Payer: Self-pay | Admitting: Family Medicine

## 2017-12-26 ENCOUNTER — Ambulatory Visit: Payer: BLUE CROSS/BLUE SHIELD | Admitting: Nurse Practitioner

## 2017-12-28 ENCOUNTER — Encounter: Payer: Self-pay | Admitting: Orthopedic Surgery

## 2017-12-31 ENCOUNTER — Other Ambulatory Visit: Payer: Self-pay | Admitting: Family Medicine

## 2018-01-13 ENCOUNTER — Encounter: Payer: Self-pay | Admitting: Gastroenterology

## 2018-01-18 ENCOUNTER — Ambulatory Visit: Payer: BLUE CROSS/BLUE SHIELD | Admitting: Orthopedic Surgery

## 2018-01-19 ENCOUNTER — Ambulatory Visit (INDEPENDENT_AMBULATORY_CARE_PROVIDER_SITE_OTHER): Payer: BLUE CROSS/BLUE SHIELD | Admitting: Orthopedic Surgery

## 2018-01-19 VITALS — BP 135/87 | HR 74 | Ht 63.0 in | Wt 194.0 lb

## 2018-01-19 DIAGNOSIS — M25572 Pain in left ankle and joints of left foot: Secondary | ICD-10-CM | POA: Diagnosis not present

## 2018-01-19 NOTE — Progress Notes (Signed)
Progress Note   Patient ID: Courtney Hale, female   DOB: Jul 02, 1966, 52 y.o.   MRN: [id number]  Chief Complaint  Patient presents with  . Follow-up    Recheck on left ankle.    52 year old female previously treated with injection and Naprosyn for synovitis and arthritis of the foot presents back after the injection with improvement in her overall pain but still has some residual pain dorsal lateral foot and ankle    Review of Systems  Neurological: Negative for tingling.   No outpatient medications have been marked as taking for the 01/19/18 encounter (Office Visit) with Carole Civil, MD.    Allergies  Allergen Reactions  . Topamax [Topiramate] Other (See Comments)    Developed kidney stones  . Augmentin [Amoxicillin-Pot Clavulanate] Other (See Comments)    Gi upset  . Sulfa Antibiotics Other (See Comments)    Burning sensation, flushing to skin     BP 135/87   Pulse 74   Ht 5\' 3"  (1.6 m)   Wt 194 lb (88 kg)   LMP 10/26/2014   BMI 34.37 kg/m   Physical Exam  Constitutional: She is oriented to person, place, and time. She appears well-developed and well-nourished.  Musculoskeletal:       Feet:  Neurological: She is alert and oriented to person, place, and time.  Psychiatric: She has a normal mood and affect. Judgment normal.  Vitals reviewed.    Medical decision-making Encounter Diagnosis  Name Primary?  . Pain of joint of left ankle and foot Yes    Recommend Spenco orthotics, warm-and-form for 2 months then recheck to see if any improvement     Arther Abbott, MD 01/19/2018 3:01 PM

## 2018-02-06 ENCOUNTER — Ambulatory Visit (INDEPENDENT_AMBULATORY_CARE_PROVIDER_SITE_OTHER): Payer: BLUE CROSS/BLUE SHIELD | Admitting: Family Medicine

## 2018-02-06 ENCOUNTER — Encounter: Payer: Self-pay | Admitting: Family Medicine

## 2018-02-06 VITALS — Temp 98.9°F | Ht 63.0 in | Wt 194.0 lb

## 2018-02-06 DIAGNOSIS — G43709 Chronic migraine without aura, not intractable, without status migrainosus: Secondary | ICD-10-CM

## 2018-02-06 DIAGNOSIS — I10 Essential (primary) hypertension: Secondary | ICD-10-CM | POA: Diagnosis not present

## 2018-02-06 DIAGNOSIS — R42 Dizziness and giddiness: Secondary | ICD-10-CM

## 2018-02-06 MED ORDER — MECLIZINE HCL 25 MG PO TABS: 25.0000 mg | ORAL_TABLET | Freq: Three times a day (TID) | ORAL | 0 refills | Status: DC | PRN

## 2018-02-06 MED ORDER — VERAPAMIL HCL ER 240 MG PO TBCR: 180.0000 mg | EXTENDED_RELEASE_TABLET | Freq: Every day | ORAL | 1 refills | Status: DC

## 2018-02-06 NOTE — Progress Notes (Signed)
   Subjective:    Patient ID: Courtney Hale, female    DOB: 07/04/1966, 52 y.o.   MRN: [id number] Mood HPI Patient is here today with complaints of a headache and bp has been up and feels dizzy.   Felt rough, seemed imblaance. Felt unsteady, drove slowly to work. yest bp elev a 155 over 98.   Considered going to er via ambulance, but did not.  bp 150 over 100  Ongoing for the last two days. She has been trying to rest and take her bp meds.Orthostatics done and pt complains of dizziness when changing positions. Have her sitting in chair for safety reasons.   Pt notes soreness in neck and shoulders, feels tight and achey  Notes a bit of soreness also   definite spinning with movement or motion      Review of Systems No headache, no major weight loss or weight gain, no chest pain no back pain abdominal pain no change in bowel habits complete ROS otherwise negative     Objective:   Physical Exam  Impression 1 vertigo.  ImpressionAlert and oriented, vitals reviewed and stable, NAD ENT-TM's and ext canals WNL bilat via otoscopic exam Soft palate, tonsils and post pharynx WNL via oropharyngeal exam Neck-symmetric, no masses; thyroid nonpalpable and nontender Pulmonary-no tachypnea or accessory muscle use; Clear without wheezes via auscultation Card--no abnrml murmurs, rhythm reg and rate WNL Carotid pulses symmetric, without bruits       Assessment & Plan:  Impression1 #1 vertigo oh vertigo discussed at great length.  Antivert 25 3 times daily as needed symptom care discussed.  Potentially related to #2  2.  Migraine headaches.  Patient states fairly stable.  Uses medicine as needed discussed to maintain same  3.  Hypertension once again fair control medications refilled diet exercise discussed salt intake discussed  Greater than 50% of this 25 minute face to face visit was spent in counseling and discussion and coordination of care regarding the above  diagnosis/diagnosies   follow-up as scheduled

## 2018-02-07 ENCOUNTER — Other Ambulatory Visit: Payer: Self-pay | Admitting: Family Medicine

## 2018-02-15 DIAGNOSIS — Z029 Encounter for administrative examinations, unspecified: Secondary | ICD-10-CM

## 2018-02-20 ENCOUNTER — Telehealth: Payer: Self-pay | Admitting: Family Medicine

## 2018-02-20 NOTE — Telephone Encounter (Signed)
Patient dropped off FMLA at last visit and please review date ,sign in yellow folder in office.

## 2018-02-23 ENCOUNTER — Encounter: Payer: Self-pay | Admitting: Orthopedic Surgery

## 2018-03-22 ENCOUNTER — Ambulatory Visit: Payer: Self-pay | Admitting: Orthopedic Surgery

## 2018-03-29 ENCOUNTER — Ambulatory Visit: Payer: BLUE CROSS/BLUE SHIELD | Admitting: Gastroenterology

## 2018-04-03 ENCOUNTER — Other Ambulatory Visit: Payer: Self-pay | Admitting: Family Medicine

## 2018-04-09 ENCOUNTER — Other Ambulatory Visit: Payer: Self-pay | Admitting: Family Medicine

## 2018-04-13 ENCOUNTER — Ambulatory Visit (INDEPENDENT_AMBULATORY_CARE_PROVIDER_SITE_OTHER): Payer: BLUE CROSS/BLUE SHIELD | Admitting: Gastroenterology

## 2018-04-13 ENCOUNTER — Encounter: Payer: Self-pay | Admitting: Gastroenterology

## 2018-04-13 DIAGNOSIS — M6208 Separation of muscle (nontraumatic), other site: Secondary | ICD-10-CM | POA: Diagnosis not present

## 2018-04-13 DIAGNOSIS — K581 Irritable bowel syndrome with constipation: Secondary | ICD-10-CM | POA: Diagnosis not present

## 2018-04-13 DIAGNOSIS — K625 Hemorrhage of anus and rectum: Secondary | ICD-10-CM

## 2018-04-13 DIAGNOSIS — K219 Gastro-esophageal reflux disease without esophagitis: Secondary | ICD-10-CM | POA: Diagnosis not present

## 2018-04-13 NOTE — Progress Notes (Signed)
Subjective:    Patient ID: Courtney Hale, female    DOB: 1966-04-19, 52 y.o.   MRN: [id number]  [name], MD   HPI Drinking water eating fiber. Seeing brbpr every time when she goes to bathroom. MAY SEE IT WHEN SHE URINATES.  NAUSEATED DUE TO MIGRAINES. SPELLS OF VOMITING IN PAST FEW MOS. GETTING ACID REFLUX: ZANTAC.NOW ON LOW CARB OVER 2.5 WEEKS. NOW OFF OF IT AGAIN. TROUBLE WITH VERTIGO AND ON MEDS FROM TUES-LAST THUR. MAY FEEL SOB. "HERNIA" HURTS WHEN SHE BENDS OVER AND DOES HER JOB. BOWELS MOVE GOOD. TROUBLE SWALLOWING PILLS. HEARTBURN: CONTROLLED WITH RANITIDINE. GAINED 9 LBS SINCE OCT 2018. BEEN EATING BECAUSE HER BOYFRIEND LEFT HER.  PT DENIES FEVER, CHILLS, HEMATEMESIS, melena, diarrhea, CHEST PAIN,   CHANGE IN BOWEL IN HABITS, constipation, OR heartburn or indigestion.  Past Medical History:  Diagnosis Date  . Complication of anesthesia    woke up during endoscopy and colonoscopy  . Depression   . Hemorrhoids   . History of esophageal stricture    2011  peptic w/  dilatation  . History of gastritis    2011  . History of melanoma excision    2015  left leg/   12-09-2015 back   . Hypertension   . Microhematuria   . Migraines   . Nephrolithiasis    left side non-obstructive   . Right ureteral stone   . Urgency of urination   . Wears contact lenses    Past Surgical History:  Procedure Laterality Date  . ABDOMINAL HYSTERECTOMY    . CARPAL TUNNEL RELEASE Right 01-05- 2015  . COLONOSCOPY N/A 08/27/2015   Procedure: COLONOSCOPY;  Surgeon: Danie Binder, MD;  Location: [facility];  Service: Endoscopy;  Laterality: N/A;  0830  . CYST EXCISION  2012     right hand  . CYSTOSCOPY W/ URETERAL STENT PLACEMENT Right 12/22/2015   Procedure: CYSTOSCOPY WITH RETROGRADE PYELOGRAM/URETERAL STENT PLACEMENT;  Surgeon: Nickie Retort, MD;  Location: [facility];  Service: Urology;  Laterality: Right;  . CYSTOSCOPY WITH RETROGRADE PYELOGRAM, URETEROSCOPY AND STENT PLACEMENT  Right 01/05/2016   Procedure: CYSTOSCOPY WITH RETROGRADE PYELOGRAM, URETEROSCOPY AND STENT REPLACEMENT;  Surgeon: Nickie Retort, MD;  Location: [facility];  Service: Urology;  Laterality: Right;  . DILATION AND CURETTAGE OF UTERUS  03-12-2009   w/  Suction  . double balloon enteroscopy     Dr. Arsenio Loader at [facility]: no erosions, no evidence of Crohn's disease, path without Crohn's.   Marland Kitchen EGD with push enteroscopy  02-12-2010    patent distal peptic stricture with diffuse antral erythema, normal D1 and D2   . LAPAROSCOPIC ASSISTED VAGINAL HYSTERECTOMY  10-26-2014   w/  Bilateral Salpingoophorectomy  . STONE EXTRACTION WITH BASKET Right 01/05/2016   Procedure: STONE EXTRACTION WITH BASKET;  Surgeon: Nickie Retort, MD;  Location: [facility];  Service: Urology;  Laterality: Right;  . TONSILLECTOMY  1998  approx   Allergies  Allergen Reactions  . Topamax [Topiramate] Other (See Comments)    Developed kidney stones  . Augmentin [Amoxicillin-Pot Clavulanate] Other (See Comments)    Gi upset  . Sulfa Antibiotics Other (See Comments)    Burning sensation, flushing to skin   Current Outpatient Medications  Medication Sig    . acetaminophen (TYLENOL) 500 MG tablet Take 500 mg by mouth every 6 (six) hours as needed. As needed    . benzonatate (TESSALON PERLES) 100 MG capsule Take 1 capsule (100  mg total) by mouth every 6 (six) hours as needed for cough.    . clonazePAM (KLONOPIN) 1 MG tablet Take 0.5-1 tablets (0.5-1 mg total) by mouth daily as needed for anxiety.    . hydrochlorothiazide (HYDRODIURIL) 25 MG tablet TAKE 1 TABLET BY MOUTH ONCE DAILY    . losartan (COZAAR) 100 MG tablet TAKE 1 TABLET BY MOUTH ONCE DAILY    . meclizine (ANTIVERT) 25 MG tablet TAKE 1 TABLET BY MOUTH THREE TIMES DAILY AS NEEDED FOR DIZZINESS    .      Marland Kitchen PARoxetine (PAXIL) 40 MG tablet TAKE 1 TABLET BY MOUTH ONCE DAILY IN THE MORNING    . ranitidine (ZANTAC) 150 MG capsule Take 150  mg by mouth as needed. As needed  QAM BEFORE WORK   . rizatriptan (MAXALT-MLT) 10 MG disintegrating tablet Take 1 tablet (10 mg total) by mouth as needed for migraine. May repeat in 2 hours if needed. Max 2 per 24 hours    . verapamil (CALAN-SR) 240 MG CR tablet Take 1 tablet (240 mg total) by mouth daily.    . naproxen sodium (ANAPROX) 220 MG tablet Take 220 mg by mouth 2 (two) times daily with a meal. Only as needed     Review of Systems PER HPI OTHERWISE ALL SYSTEMS ARE NEGATIVE.     Objective:   Physical Exam  Constitutional: She is oriented to person, place, and time. She appears well-developed and well-nourished. No distress.  HENT:  Head: Normocephalic and atraumatic.  Mouth/Throat: Oropharynx is clear and moist. No oropharyngeal exudate.  Eyes: Pupils are equal, round, and reactive to light. No scleral icterus.  Neck: Normal range of motion. Neck supple.  Cardiovascular: Normal rate, regular rhythm and normal heart sounds.  Pulmonary/Chest: Effort normal and breath sounds normal. No respiratory distress.  Abdominal: Soft. Bowel sounds are normal. She exhibits no distension. There is tenderness. There is no rebound and no guarding.  MILD TTP IN THE EPIGASTRIUM & BUQS.  Musculoskeletal: She exhibits no edema.  Lymphadenopathy:    She has no cervical adenopathy.  Neurological: She is alert and oriented to person, place, and time.  NO FOCAL DEFICITS  Psychiatric:  ANXIOUS MOOD. NL AFFECT  Vitals reviewed.     Assessment & Plan:

## 2018-04-13 NOTE — Assessment & Plan Note (Signed)
GAINED 9 LBS SINCE OCT 2018.    USE PREPARATION H FOUR TIMES  A DAY for 5 days and then IF NEEDED TO RELIEVE RECTAL PAIN/PRESSURE/BLEEDING.  PLEASE CALL IN ONE MONTH IF SYMPTOMS ARE NOT IMPROVED AN DI WILL REFER YOU TO DR. Arnoldo Morale FOR HEMORRHOID SURGERY.     DRINK WATER TO KEEP YOUR URINE LIGHT YELLOW. CONTINUE YOUR WEIGHT LOSS EFFORTS. A WEIGHT OF 170 lbs  WILL GET YOUR BODY MASS INDEX(BMI) UNDER 30.  Do not drink DIET SODA, AVOID HIGH FRUCTOSE CORN SYRUP, chew SUGAR FREE GUM, OR USE ARTIFICIAL SWEETENERS. USE STEVIA, HONEY, OR AGAVE NECTAR. DO PLANKS TO STRENGTHEN YOUR ABDOMINAL AND BACK MUSCLES: FULL PLANK 15 SECS REST 1 MIN-REPEAT 3 TIMES, SIDE PLANK FOR 15 SEC REST 1 MIN-REPEAT 3 TIMES ON RIGHT AND LEFT SIDE, AND HALF PLANK FOR 15 SECS-REST 1 MIN. REPEAT 3 TIMES.     FOLLOW UP IN 6 MOS.

## 2018-04-13 NOTE — Assessment & Plan Note (Signed)
SYMPTOMS FAIRLY WELL CONTROLLED.  CONTINUE TO MONITOR SYMPTOMS. FOLLOW UP IN 6 MOS.  

## 2018-04-13 NOTE — Patient Instructions (Addendum)
   USE PREPARATION H FOUR TIMES  A DAY for 5 days and then IF NEEDED TO RELIEVE RECTAL PAIN/PRESSURE/BLEEDING.  PLEASE CALL IN ONE MONTH IF SYMPTOMS ARE NOT IMPROVED AN DI WILL REFER YOU TO DR. Arnoldo Morale FOR HEMORRHOID SURGERY.     DRINK WATER TO KEEP YOUR URINE LIGHT YELLOW.  CONTINUE YOUR WEIGHT LOSS EFFORTS. A WEIGHT OF 170 lbs  WILL GET YOUR BODY MASS INDEX(BMI) UNDER 30.   Do not drink DIET SODA, AVOID HIGH FRUCTOSE CORN SYRUP, chew SUGAR FREE GUM, OR USE ARTIFICIAL SWEETENERS. USE STEVIA, HONEY, OR AGAVE NECTAR.  DO PLANKS TO STRENGTHEN YOUR ABDOMINAL AND BACK MUSCLES: FULL PLANK 15 SECS REST 1 MIN-REPEAT 3 TIMES, SIDE PLANK FOR 15 SEC REST 1 MIN-REPEAT 3 TIMES ON RIGHT AND LEFT SIDE, AND HALF PLANK FOR 15 SECS-REST 1 MIN. REPEAT 3 TIMES.   AVOID REFLUX TRIGGERS. SEE INFO BELOW.  CONTINUE ZANTAC.   FOLLOW UP IN 6 MOS.         Lifestyle and home remedies TO MANAGE REFLUX/CHEST PAIN   You may eliminate or reduce the frequency of heartburn by making the following lifestyle changes:   Control your weight. Being overweight is a major risk factor for heartburn and GERD. Excess pounds put pressure on your abdomen, pushing up your stomach and causing acid to back up into your esophagus.    Eat smaller meals. 4 TO 6 MEALS A DAY. This reduces pressure on the lower esophageal sphincter, helping to prevent the valve from opening and acid from washing back into your esophagus.    Loosen your belt. Clothes that fit tightly around your waist put pressure on your abdomen and the lower esophageal sphincter.    Eliminate heartburn triggers. Everyone has specific triggers. Common triggers such as fatty or fried foods, spicy food, tomato sauce, carbonated beverages, alcohol, chocolate, mint, garlic, onion, caffeine and nicotine may make heartburn worse.    Avoid stooping or bending. Tying your shoes is OK. Bending over for longer periods to weed your garden isn't,  especially soon after eating.    Don't lie down after a meal. Wait at least three to four hours after eating before going to bed, and don't lie down right after eating.    PUT THE HEAD OF YOUR BED ON 6 INCH BLOCKS.     Alternative medicine   Several home remedies exist for treating GERD, but they provide only temporary relief. They include drinking baking soda (sodium bicarbonate) added to water or drinking other fluids such as baking soda mixed with cream of tartar and water.   Although these liquids create temporary relief by neutralizing, washing away or buffering acids, eventually they aggravate the situation by adding gas and fluid to your stomach, increasing pressure and causing more acid reflux. Further, adding more sodium to your diet may increase your blood pressure and add stress to your heart, and excessive bicarbonate ingestion can alter the acid-base balance in your body.

## 2018-04-13 NOTE — Assessment & Plan Note (Signed)
SYMPTOMS NOT IDEALLY CONTROLLED.    USE PREPARATION H FOUR TIMES  A DAY for 5 days and then IF NEEDED TO RELIEVE RECTAL PAIN/PRESSURE/BLEEDING. PLEASE CALL IN ONE MONTH IF SYMPTOMS ARE NOT IMPROVED AN DI WILL REFER YOU TO DR. Arnoldo Morale FOR HEMORRHOID SURGERY.  FOLLOW UP IN 6 MOS.

## 2018-04-13 NOTE — Assessment & Plan Note (Signed)
SYMPTOMS FAIRLY WELL CONTROLLED ON RANITIDINE.  DRINK WATER TO KEEP YOUR URINE LIGHT YELLOW. CONTINUE YOUR WEIGHT LOSS EFFORTS. A WEIGHT OF 170 lbs  WILL GET YOUR BODY MASS INDEX(BMI) UNDER 30.   Do not drink DIET SODA, AVOID HIGH FRUCTOSE CORN SYRUP, chew SUGAR FREE GUM, OR USE ARTIFICIAL SWEETENERS. USE STEVIA, HONEY, OR AGAVE NECTAR. DO PLANKS TO STRENGTHEN YOUR ABDOMINAL AND BACK MUSCLES: FULL PLANK 15 SECS REST 1 MIN-REPEAT 3 TIMES, SIDE PLANK FOR 15 SEC REST 1 MIN-REPEAT 3 TIMES ON RIGHT AND LEFT SIDE, AND HALF PLANK FOR 15 SECS-REST 1 MIN. REPEAT 3 TIMES. AVOID REFLUX TRIGGERS.  HANDOUT GIVEN. CONTINUE ZANTAC.  FOLLOW UP IN 6 MOS.

## 2018-04-17 ENCOUNTER — Ambulatory Visit (INDEPENDENT_AMBULATORY_CARE_PROVIDER_SITE_OTHER): Payer: BLUE CROSS/BLUE SHIELD | Admitting: Family Medicine

## 2018-04-17 ENCOUNTER — Encounter: Payer: Self-pay | Admitting: Family Medicine

## 2018-04-17 VITALS — BP 122/78 | Temp 98.9°F | Ht 63.5 in | Wt 187.0 lb

## 2018-04-17 DIAGNOSIS — R42 Dizziness and giddiness: Secondary | ICD-10-CM | POA: Diagnosis not present

## 2018-04-17 DIAGNOSIS — G43709 Chronic migraine without aura, not intractable, without status migrainosus: Secondary | ICD-10-CM | POA: Diagnosis not present

## 2018-04-17 MED ORDER — HYDROCODONE-ACETAMINOPHEN 5-325 MG PO TABS: ORAL_TABLET | ORAL | 0 refills | Status: DC

## 2018-04-17 MED ORDER — ONDANSETRON 4 MG PO TBDP: ORAL_TABLET | ORAL | 0 refills | Status: DC

## 2018-04-17 NOTE — Progress Notes (Signed)
   Subjective:    Patient ID: Courtney Hale, female    DOB: [birth year], 52 y.o.   MRN: [id number]  Migraine   This is a new problem. The current episode started in the past 7 days. Pain location: on top of head; feels like something is sitting on top of head. Associated symptoms include nausea and vomiting. Associated symptoms comments: Seeing double yesterday. The symptoms are aggravated by bright light and noise. She has tried acetaminophen and NSAIDs (Maxalt, Naproxen ) for the symptoms. The treatment provided mild relief.   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 is working so hard on a Saturday Review of Systems  Gastrointestinal: Positive for nausea and vomiting.       Objective:   Physical Exam Alert vitals stable, NAD. Blood pressure good on repeat. HEENT normal. Lungs clear. Heart regular rate and rhythm. Neuro exam intact       Assessment & Plan:  Impression acute migraine headache.  Protracted.  Fairly severe.  Patient already on prophylaxis.  Cannot tolerate.  Discussed we will give hydrocodone for short-term pain control symptom care discussed

## 2018-05-22 ENCOUNTER — Other Ambulatory Visit: Payer: Self-pay | Admitting: Nurse Practitioner

## 2018-05-23 ENCOUNTER — Other Ambulatory Visit: Payer: Self-pay | Admitting: *Deleted

## 2018-05-23 MED ORDER — ONDANSETRON 4 MG PO TBDP: ORAL_TABLET | ORAL | 4 refills | Status: DC

## 2018-06-23 ENCOUNTER — Other Ambulatory Visit: Payer: Self-pay | Admitting: Family Medicine

## 2018-06-23 MED ORDER — HYDROCODONE-ACETAMINOPHEN 5-325 MG PO TABS: ORAL_TABLET | ORAL | 0 refills | Status: DC

## 2018-06-23 NOTE — Telephone Encounter (Signed)
Pt requesting refill on HYDROcodone-acetaminophen (NORCO/VICODIN) 5-325 MG tablet. Please send to [facility], [phone]

## 2018-06-23 NOTE — Telephone Encounter (Signed)
Prescription up front for pick up. Left message to return call to notify patient.

## 2018-06-23 NOTE — Telephone Encounter (Signed)
May ref 24 one q 6 hrs prn

## 2018-06-27 NOTE — Telephone Encounter (Signed)
Left message to return call 

## 2018-06-29 NOTE — Telephone Encounter (Signed)
Pt.notified

## 2018-07-07 ENCOUNTER — Other Ambulatory Visit: Payer: Self-pay | Admitting: Family Medicine

## 2018-07-21 DIAGNOSIS — Z029 Encounter for administrative examinations, unspecified: Secondary | ICD-10-CM

## 2018-07-31 ENCOUNTER — Telehealth: Payer: Self-pay | Admitting: Family Medicine

## 2018-07-31 DIAGNOSIS — Z029 Encounter for administrative examinations, unspecified: Secondary | ICD-10-CM

## 2018-07-31 NOTE — Telephone Encounter (Signed)
Patient brought in FMLA to be updated today. Please review form and fill in highlighted area,date and sign in your yellow folder.

## 2018-08-03 ENCOUNTER — Encounter: Payer: Self-pay | Admitting: Gastroenterology

## 2018-08-08 ENCOUNTER — Encounter: Payer: Self-pay | Admitting: Family Medicine

## 2018-08-08 ENCOUNTER — Ambulatory Visit (INDEPENDENT_AMBULATORY_CARE_PROVIDER_SITE_OTHER): Payer: BLUE CROSS/BLUE SHIELD | Admitting: Family Medicine

## 2018-08-08 VITALS — BP 122/88 | HR 89 | Temp 98.5°F | Ht 63.5 in | Wt 186.8 lb

## 2018-08-08 DIAGNOSIS — J019 Acute sinusitis, unspecified: Secondary | ICD-10-CM

## 2018-08-08 MED ORDER — CEFDINIR 300 MG PO CAPS: 300.0000 mg | ORAL_CAPSULE | Freq: Two times a day (BID) | ORAL | 0 refills | Status: AC

## 2018-08-08 MED ORDER — BENZONATATE 100 MG PO CAPS: 100.0000 mg | ORAL_CAPSULE | Freq: Four times a day (QID) | ORAL | 0 refills | Status: DC | PRN

## 2018-08-08 NOTE — Progress Notes (Signed)
   Subjective:    Patient ID: Courtney Hale, female    DOB: 1966/10/11, 52 y.o.   MRN: [id number]  HPI Pt sx started Friday with a sore throat x 5 days ago. Pt is c/o hoarseness, coughing with green mucus,runny nose, frontal headache all started x3 days ago. Feels like symptoms are getting worse.  Daughter has had the flu and strep throat  Has taken previous prescriptions for zofran and benzonatate has been helpful. Has tried otc allergy medication and sinus medication without much relief.  Review of Systems  Constitutional: Positive for chills.  HENT: Positive for rhinorrhea and sore throat.   Respiratory: Positive for cough. Negative for shortness of breath.   Gastrointestinal: Positive for nausea. Negative for vomiting.       Objective:   Physical Exam  Constitutional: She is oriented to person, place, and time. She appears well-developed and well-nourished. No distress.  HENT:  Head: Normocephalic and atraumatic.  Right Ear: Tympanic membrane normal.  Left Ear: Tympanic membrane normal.  Nose: Mucosal edema and sinus tenderness present.  Mouth/Throat: Uvula is midline. Posterior oropharyngeal erythema present.  Neck: Neck supple.  Cardiovascular: Normal rate, regular rhythm and normal heart sounds.  Pulmonary/Chest: Effort normal and breath sounds normal. No respiratory distress. She has no wheezes.  Lymphadenopathy:    She has no cervical adenopathy.  Neurological: She is alert and oriented to person, place, and time.  Skin: Skin is warm and dry.  Psychiatric: She has a normal mood and affect.  Nursing note and vitals reviewed.      Assessment & Plan:  1. Acute rhinosinusitis Likely post-viral sinusitis, will go ahead and treat with abx, refilled benzonatate for prn use for cough since this is helpful for the patient. Warning signs discussed, she will f/u if symptoms worsen or fail to improve.  - cefdinir (OMNICEF) 300 MG capsule; Take 1 capsule (300 mg total) by  mouth 2 (two) times daily for 10 days. - benzonatate (TESSALON PERLES) 100 MG capsule; Take 1 capsule (100 mg total) by mouth every 6 (six) hours as needed for cough.  Dr. Richardson Landry was consulted on this case and is in agreement with the above plan.

## 2018-08-23 ENCOUNTER — Other Ambulatory Visit: Payer: Self-pay | Admitting: Family Medicine

## 2018-08-23 ENCOUNTER — Telehealth: Payer: Self-pay | Admitting: Family Medicine

## 2018-08-23 MED ORDER — CEFPROZIL 500 MG PO TABS: ORAL_TABLET | ORAL | 0 refills | Status: DC

## 2018-08-23 NOTE — Telephone Encounter (Signed)
Medication sent in and patient is aware.  

## 2018-08-23 NOTE — Telephone Encounter (Signed)
Patient was seen 08/08/18 and given cefdinir 300 mg and finished it up on 10/25 but still has congestion,sorethroat and now nausea because she has been coughing so much. Can you call in something else to [facility]

## 2018-08-23 NOTE — Telephone Encounter (Signed)
Please advise. Thank you

## 2018-08-23 NOTE — Telephone Encounter (Signed)
cefzil 500 bid 10 d

## 2018-08-30 ENCOUNTER — Other Ambulatory Visit: Payer: Self-pay | Admitting: Family Medicine

## 2018-08-31 ENCOUNTER — Encounter: Payer: Self-pay | Admitting: Family Medicine

## 2018-08-31 ENCOUNTER — Ambulatory Visit (INDEPENDENT_AMBULATORY_CARE_PROVIDER_SITE_OTHER): Payer: BLUE CROSS/BLUE SHIELD | Admitting: Family Medicine

## 2018-08-31 VITALS — BP 112/88 | Wt 189.0 lb

## 2018-08-31 DIAGNOSIS — J019 Acute sinusitis, unspecified: Secondary | ICD-10-CM

## 2018-08-31 DIAGNOSIS — B9689 Other specified bacterial agents as the cause of diseases classified elsewhere: Secondary | ICD-10-CM

## 2018-08-31 MED ORDER — PREDNISONE 20 MG PO TABS: ORAL_TABLET | ORAL | 0 refills | Status: DC

## 2018-08-31 MED ORDER — DOXYCYCLINE HYCLATE 100 MG PO TABS: 100.0000 mg | ORAL_TABLET | Freq: Two times a day (BID) | ORAL | 0 refills | Status: DC

## 2018-08-31 NOTE — Progress Notes (Signed)
   Subjective:    Patient ID: Courtney Hale, female    DOB: [birth year], 52 y.o.   MRN: [id number]  HPI  Patient is here today state she is no better after two rounds of antibiotics. She states she has had vomiting and cough,runny nose,right ear pain, Headache,sore throat. She states she is currently still on an antibx currently. Previous office visit reviewed Telephone message reviewed Patient states overall doing fairly well but having some significant troubles with aching discomfort not feeling good sinus pressure pain discomfort denies high fever chills denies wheezing or difficulty breathing but does relate when she takes a deep breath she spontaneously coughs Review of Systems  Constitutional: Negative for activity change and fever.  HENT: Positive for congestion and rhinorrhea. Negative for ear pain.   Eyes: Negative for discharge.  Respiratory: Positive for cough. Negative for shortness of breath and wheezing.   Cardiovascular: Negative for chest pain.       Objective:   Physical Exam  Constitutional: She appears well-developed.  HENT:  Head: Normocephalic.  Nose: Nose normal.  Mouth/Throat: Oropharynx is clear and moist. No oropharyngeal exudate.  Neck: Neck supple.  Cardiovascular: Normal rate and normal heart sounds.  No murmur heard. Pulmonary/Chest: Effort normal and breath sounds normal. She has no wheezes.  Lymphadenopathy:    She has no cervical adenopathy.  Skin: Skin is warm and dry.  Nursing note and vitals reviewed.         Assessment & Plan:  Persistent sinus infection along with respiratory illness I suspect she probably had parainfluenza virus which caused secondary problems of sinus infection as well as bronchial irritation Her O2 saturation is 96% We will go ahead with a round of doxycycline and a short course of prednisone I do not recommend x-rays or lab work currently if patient has progressive troubles or worse we may need further intervention  she needs to connect with [place] if not improving over the next few days and to rest up over the next few days

## 2018-10-15 ENCOUNTER — Other Ambulatory Visit: Payer: Self-pay | Admitting: Family Medicine

## 2018-12-07 ENCOUNTER — Other Ambulatory Visit: Payer: Self-pay

## 2018-12-08 MED ORDER — PAROXETINE HCL 40 MG PO TABS: 40.0000 mg | ORAL_TABLET | Freq: Every morning | ORAL | 1 refills | Status: DC

## 2019-01-28 ENCOUNTER — Other Ambulatory Visit: Payer: Self-pay | Admitting: Family Medicine

## 2019-03-20 ENCOUNTER — Other Ambulatory Visit: Payer: Self-pay | Admitting: Family Medicine

## 2019-03-20 MED ORDER — HYDROCODONE-ACETAMINOPHEN 5-325 MG PO TABS: ORAL_TABLET | ORAL | 0 refills | Status: DC

## 2019-03-20 NOTE — Telephone Encounter (Signed)
Needs a refill on HYDROcodone-acetaminophen (NORCO/VICODIN) 5-325 MG tablet for migraines.  [facility]

## 2019-03-20 NOTE — Telephone Encounter (Signed)
Medication pended.

## 2019-03-20 NOTE — Telephone Encounter (Signed)
Ok times one 

## 2019-04-06 ENCOUNTER — Other Ambulatory Visit: Payer: Self-pay | Admitting: Family Medicine

## 2019-04-30 ENCOUNTER — Other Ambulatory Visit: Payer: Self-pay | Admitting: Family Medicine

## 2019-05-02 ENCOUNTER — Other Ambulatory Visit: Payer: Self-pay | Admitting: Family Medicine

## 2019-07-05 ENCOUNTER — Other Ambulatory Visit: Payer: Self-pay | Admitting: Family Medicine

## 2019-07-05 NOTE — Telephone Encounter (Signed)
sched vist, virt or f to f in October, then ref times one

## 2019-07-06 NOTE — Telephone Encounter (Signed)
Left message

## 2019-07-17 MED ORDER — PAROXETINE HCL 40 MG PO TABS: ORAL_TABLET | ORAL | 0 refills | Status: DC

## 2019-07-17 MED ORDER — LOSARTAN POTASSIUM 100 MG PO TABS: 100.0000 mg | ORAL_TABLET | Freq: Every day | ORAL | 0 refills | Status: DC

## 2019-07-17 NOTE — Addendum Note (Signed)
Addended by: Vicente Males on: 07/17/2019 01:59 PM   Modules accepted: Orders

## 2019-07-17 NOTE — Telephone Encounter (Signed)
Patient has appointment on 9/28 followup for medication.

## 2019-07-23 ENCOUNTER — Encounter: Payer: Self-pay | Admitting: Family Medicine

## 2019-07-23 ENCOUNTER — Other Ambulatory Visit: Payer: Self-pay

## 2019-07-23 ENCOUNTER — Ambulatory Visit (INDEPENDENT_AMBULATORY_CARE_PROVIDER_SITE_OTHER): Payer: BC Managed Care – PPO | Admitting: Family Medicine

## 2019-07-23 VITALS — BP 120/81

## 2019-07-23 DIAGNOSIS — G43709 Chronic migraine without aura, not intractable, without status migrainosus: Secondary | ICD-10-CM

## 2019-07-23 DIAGNOSIS — F329 Major depressive disorder, single episode, unspecified: Secondary | ICD-10-CM

## 2019-07-23 DIAGNOSIS — I10 Essential (primary) hypertension: Secondary | ICD-10-CM

## 2019-07-23 DIAGNOSIS — F5102 Adjustment insomnia: Secondary | ICD-10-CM | POA: Diagnosis not present

## 2019-07-23 DIAGNOSIS — F419 Anxiety disorder, unspecified: Secondary | ICD-10-CM

## 2019-07-23 MED ORDER — PAROXETINE HCL 40 MG PO TABS: ORAL_TABLET | ORAL | 1 refills | Status: DC

## 2019-07-23 MED ORDER — HYDROCODONE-ACETAMINOPHEN 5-325 MG PO TABS: ORAL_TABLET | ORAL | 0 refills | Status: DC

## 2019-07-23 MED ORDER — LOSARTAN POTASSIUM 100 MG PO TABS: 100.0000 mg | ORAL_TABLET | Freq: Every day | ORAL | 1 refills | Status: DC

## 2019-07-23 MED ORDER — HYDROCHLOROTHIAZIDE 25 MG PO TABS: 25.0000 mg | ORAL_TABLET | Freq: Every day | ORAL | 1 refills | Status: DC

## 2019-07-23 MED ORDER — CLONAZEPAM 1 MG PO TABS: 0.5000 mg | ORAL_TABLET | Freq: Every day | ORAL | 1 refills | Status: DC | PRN

## 2019-07-23 NOTE — Progress Notes (Signed)
   Subjective:  Audio only  Patient ID: Courtney Hale, female    DOB: Dec 31, 1965, 53 y.o.   MRN: [id number]  Hypertension This is a chronic problem. The current episode started more than 1 year ago. Treatments tried: losartan, hctz. There are no compliance problems.    BP been running good 120/81 Patient having a lot of muscle cramps and spasms- doesn't think she drinks enough.   Review of Systems  Virtual Visit via Video Note  I connected with REBEKKA BURAK on 07/23/19 at  3:00 PM EDT by a video enabled telemedicine application and verified that I am speaking with the correct person using two identifiers.  Location: Patient: home Provider: office   I discussed the limitations of evaluation and management by telemedicine and the availability of in person appointments. The patient expressed understanding and agreed to proceed.  History of Present Illness:    Observations/Objective:   Assessment and Plan:   Follow Up Instructions:    I discussed the assessment and treatment plan with the patient. The patient was provided an opportunity to ask questions and all were answered. The patient agreed with the plan and demonstrated an understanding of the instructions.   The patient was advised to call back or seek an in-person evaluation if the symptoms worsen or if the condition fails to improve as anticipated.  I provided 25 minutes of non-face-to-face time during this encounter.  Patient notes ongoing compliance with antidepressant medication. No obvious side effects. Reports does not miss a dose. Overall continues to help depression substantially. No thoughts of homicide or suicide. Would like to maintain medication.  Blood pressure medicine and blood pressure levels reviewed today with patient. Compliant with blood pressure medicine. States does not miss a dose. No obvious side effects. Blood pressure generally good when checked elsewhere. Watching salt intake.   Patient  has worsening insomnia.  She would like to start taking Ambien 10 nightly.  Took in the past it definitely helped.  Migraines are an ongoing challenge.  Uses Maxalt.  Will occasionally use hydrocodone for more severe pain.  Patient notes ongoing challenges with anxiety.  Occasionally will utilize half clonazepam as needed and requests refill in this regard  No headache no chest pain no back pain no abdominal pain    Objective:   Physical Exam Virtual       Assessment & Plan:  Impression 1 hypertension.  Good control discussed maintain same meds  2.  Chronic migraines.  Ongoing need for medication refills  3.  Muscle cramps await blood work results  4.  Generalized anxiety with element depression discussed maintain Paxil exercise encouraged  5.  Insomnia worsening will revisit attempted Ambien

## 2019-07-24 ENCOUNTER — Other Ambulatory Visit: Payer: Self-pay | Admitting: *Deleted

## 2019-07-24 MED ORDER — ZOLPIDEM TARTRATE 10 MG PO TABS: 10.0000 mg | ORAL_TABLET | Freq: Every evening | ORAL | 5 refills | Status: DC | PRN

## 2019-08-15 ENCOUNTER — Ambulatory Visit (INDEPENDENT_AMBULATORY_CARE_PROVIDER_SITE_OTHER): Payer: BC Managed Care – PPO | Admitting: Orthopedic Surgery

## 2019-08-15 ENCOUNTER — Other Ambulatory Visit: Payer: Self-pay

## 2019-08-15 ENCOUNTER — Encounter

## 2019-08-15 VITALS — BP 124/86 | HR 72 | Temp 98.1°F | Ht 63.0 in | Wt 176.0 lb

## 2019-08-15 DIAGNOSIS — M25572 Pain in left ankle and joints of left foot: Secondary | ICD-10-CM | POA: Diagnosis not present

## 2019-08-15 NOTE — Progress Notes (Signed)
OFFICE VISIT  Chief Complaint  Patient presents with  . Follow-up    Recheck on left ankle.    53 year old female presents for recurrent pain in her left foot we saw her last year gave her an injection some Naprosyn asked her to get some orthotics she seemed to get better after the injection but seemingly never got the orthotics  She presents for 4 to 6 weeks history now of moderate pain subtalar joint left ankle associated with painful weightbearing   Review of Systems  Musculoskeletal: Positive for joint pain.  Skin: Negative.   Neurological: Negative for tingling, sensory change and focal weakness.     Past Medical History:  Diagnosis Date  . Complication of anesthesia    woke up during endoscopy and colonoscopy  . Depression   . Hemorrhoids   . History of esophageal stricture    2011  peptic w/  dilatation  . History of gastritis    2011  . History of melanoma excision    2015  left leg/   12-09-2015 back   . Hypertension   . Microhematuria   . Migraines   . Nephrolithiasis    left side non-obstructive   . Right ureteral stone   . Urgency of urination   . Wears contact lenses     Past Surgical History:  Procedure Laterality Date  . ABDOMINAL HYSTERECTOMY    . CARPAL TUNNEL RELEASE Right 01-05- 2015  . COLONOSCOPY N/A 08/27/2015   Procedure: COLONOSCOPY;  Surgeon: Danie Binder, MD;  Location: [facility];  Service: Endoscopy;  Laterality: N/A;  0830  . CYST EXCISION  2012     right hand  . CYSTOSCOPY W/ URETERAL STENT PLACEMENT Right 12/22/2015   Procedure: CYSTOSCOPY WITH RETROGRADE PYELOGRAM/URETERAL STENT PLACEMENT;  Surgeon: Nickie Retort, MD;  Location: [facility];  Service: Urology;  Laterality: Right;  . CYSTOSCOPY WITH RETROGRADE PYELOGRAM, URETEROSCOPY AND STENT PLACEMENT Right 01/05/2016   Procedure: CYSTOSCOPY WITH RETROGRADE PYELOGRAM, URETEROSCOPY AND STENT REPLACEMENT;  Surgeon: Nickie Retort, MD;  Location: [facility]   Service: Urology;  Laterality: Right;  . DILATION AND CURETTAGE OF UTERUS  03-12-2009   w/  Suction  . double balloon enteroscopy     [facility]: no erosions, no evidence of Crohn's disease, path without Crohn's.   Marland Kitchen EGD with push enteroscopy  02-12-2010    patent distal peptic stricture with diffuse antral erythema, normal D1 and D2   . LAPAROSCOPIC ASSISTED VAGINAL HYSTERECTOMY  10-26-2014   w/  Bilateral Salpingoophorectomy  . STONE EXTRACTION WITH BASKET Right 01/05/2016   Procedure: STONE EXTRACTION WITH BASKET;  Surgeon: Nickie Retort, MD;  Location: [facility];  Service: Urology;  Laterality: Right;  . TONSILLECTOMY  1998  approx    Family History  Problem Relation Age of Onset  . Diabetes Mother   . Colon cancer Neg Hx    Social History   Tobacco Use  . Smoking status: Never Smoker  . Smokeless tobacco: Never Used  Substance Use Topics  . Alcohol use: Yes    Comment: occassionally  . Drug use: No    Allergies  Allergen Reactions  . Topamax [Topiramate] Other (See Comments)    Developed kidney stones  . Augmentin [Amoxicillin-Pot Clavulanate] Other (See Comments)    Gi upset  . Sulfa Antibiotics Other (See Comments)    Burning sensation, flushing to skin    Current Meds  Medication Sig  .  acetaminophen (TYLENOL) 500 MG tablet Take 500 mg by mouth every 6 (six) hours as needed. As needed  . clonazePAM (KLONOPIN) 1 MG tablet Take 0.5-1 tablets (0.5-1 mg total) by mouth daily as needed for anxiety.  . hydrochlorothiazide (HYDRODIURIL) 25 MG tablet Take 1 tablet (25 mg total) by mouth daily.  Marland Kitchen HYDROcodone-acetaminophen (NORCO/VICODIN) 5-325 MG tablet Take one tab every 4-6 hours as needed for headache  . losartan (COZAAR) 100 MG tablet Take 1 tablet (100 mg total) by mouth daily.  Marland Kitchen PARoxetine (PAXIL) 40 MG tablet TAKE 1 TABLET BY MOUTH ONCE DAILY IN THE MORNING  . rizatriptan (MAXALT-MLT) 10 MG disintegrating tablet Take 1 tablet  (10 mg total) by mouth as needed for migraine. May repeat in 2 hours if needed. Max 2 per 24 hours  . verapamil (CALAN-SR) 240 MG CR tablet Take 1 tablet (240 mg total) by mouth daily.  Marland Kitchen zolpidem (AMBIEN) 10 MG tablet Take 1 tablet (10 mg total) by mouth at bedtime as needed for sleep.    BP 124/86   Pulse 72   Temp 98.1 F (36.7 C)   Ht 5\' 3"  (1.6 m)   Wt 176 lb (79.8 kg)   LMP 10/26/2014   BMI 31.18 kg/m   Physical Exam Normal development grooming and hygiene normal body habitus.  She is awake and alert. Ortho Exam Right foot skin is normal there is no tenderness normal range of motion at the ankle ankle stable motor exam normal pulse and perfusion normal normal sensation  Left foot tender in the subtalar joint alignment is normal normal range of motion she also has some tenderness on the dorsum of the foot at the talus ankle is stable motor exam is normal there is no atrophy normal pulse and perfusion normal sensation   MEDICAL DECISION SECTION  [facility] were reevaluated no fracture dislocation or arthritis seen  My independent reading of xrays:  As above  Encounter Diagnosis  Name Primary?  . Pain of joint of left ankle and foot Yes    PLAN: (Rx., injectx, surgery, frx, mri/ct)  Carpal tunnel syndrome repeat injection  Good supportive shoes  Advil  Orthotics No orders of the defined types were placed in this encounter.   Arther Abbott, MD  08/15/2019 4:07 PM

## 2019-08-15 NOTE — Patient Instructions (Signed)
You have received an injection of steroids into the joint. 15% of patients will have increased pain within the 24 hours postinjection.   This is transient and will go away.   We recommend that you use ice packs on the injection site for 20 minutes every 2 hours and extra strength Tylenol 2 tablets every 8 as needed until the pain resolves.  If you continue to have pain after taking the Tylenol and using the ice please call the office for further instructions.   Try some advil 400 mg 3 x a day for 2 weeks   Wear orthotics and good supportive shoes

## 2019-09-06 ENCOUNTER — Telehealth: Payer: Self-pay | Admitting: Family Medicine

## 2019-09-06 DIAGNOSIS — Z79899 Other long term (current) drug therapy: Secondary | ICD-10-CM

## 2019-09-06 DIAGNOSIS — I10 Essential (primary) hypertension: Secondary | ICD-10-CM

## 2019-09-06 NOTE — Telephone Encounter (Signed)
Pt having trouble getting zolpidem (AMBIEN) 10 MG tablet refilled  Pharmacy states insurance will only cover #30 pills every 45 days   Can we get a quantity limit override?  Pt states she needs a refill on rizatriptan (MAXALT-MLT) 10 MG disintegrating tablet  Also we never ordered her labs - please advise    Please advise & call pt      [facility]

## 2019-09-06 NOTE — Telephone Encounter (Signed)
Dr. Richardson Landry should handle this issue on Friday thank you

## 2019-09-06 NOTE — Telephone Encounter (Addendum)
Insurance will only pay for 30 pills of Ambien every 45 days and she takes it every night  so she needs 45 pills for 45 days- please advise what she should do. Also needs refill of Maxalt  And would like some routine labwork ordered.

## 2019-09-07 MED ORDER — RIZATRIPTAN BENZOATE 10 MG PO TBDP: 10.0000 mg | ORAL_TABLET | ORAL | 5 refills | Status: DC | PRN

## 2019-09-07 NOTE — Telephone Encounter (Signed)
Lip liv m7  Ref maxalt  Pt will  Have to pay for addtnl herself if her insur does not cover

## 2019-09-07 NOTE — Telephone Encounter (Signed)
Lab orders placed, medication refill sent in. Left message to return call

## 2019-09-10 NOTE — Telephone Encounter (Signed)
Left message to return call 

## 2019-09-12 NOTE — Telephone Encounter (Signed)
Patient notified and verbalized understanding. 

## 2019-09-14 LAB — LIPID PANEL
Chol/HDL Ratio: 5.7 ratio — ABNORMAL HIGH (ref 0.0–4.4)
Cholesterol, Total: 258 mg/dL — ABNORMAL HIGH (ref 100–199)
HDL: 45 mg/dL (ref >39)
LDL Chol Calc (NIH): 182 mg/dL — ABNORMAL HIGH (ref 0–99)
Triglycerides: 166 mg/dL — ABNORMAL HIGH (ref 0–149)
VLDL Cholesterol Cal: 31 mg/dL (ref 5–40)

## 2019-09-14 LAB — BASIC METABOLIC PANEL
BUN/Creatinine Ratio: 18 (ref 9–23)
BUN: 10 mg/dL (ref 6–24)
CO2: 23 mmol/L (ref 20–29)
Calcium: 9.8 mg/dL (ref 8.7–10.2)
Chloride: 97 mmol/L (ref 96–106)
Creatinine, Ser: 0.57 mg/dL (ref 0.57–1.00)
GFR calc Af Amer: 124 mL/min/{1.73_m2} (ref >59)
GFR calc non Af Amer: 108 mL/min/{1.73_m2} (ref >59)
Glucose: 122 mg/dL — ABNORMAL HIGH (ref 65–99)
Potassium: 3.7 mmol/L (ref 3.5–5.2)
Sodium: 139 mmol/L (ref 134–144)

## 2019-09-14 LAB — HEPATIC FUNCTION PANEL
ALT: 59 IU/L — ABNORMAL HIGH (ref 0–32)
AST: 51 IU/L — ABNORMAL HIGH (ref 0–40)
Albumin: 4.6 g/dL (ref 3.8–4.9)
Alkaline Phosphatase: 118 IU/L — ABNORMAL HIGH (ref 39–117)
Bilirubin Total: 0.6 mg/dL (ref 0.0–1.2)
Bilirubin, Direct: 0.15 mg/dL (ref 0.00–0.40)
Total Protein: 7.2 g/dL (ref 6.0–8.5)

## 2019-09-27 ENCOUNTER — Ambulatory Visit (INDEPENDENT_AMBULATORY_CARE_PROVIDER_SITE_OTHER): Payer: BC Managed Care – PPO | Admitting: Family Medicine

## 2019-09-27 ENCOUNTER — Other Ambulatory Visit: Payer: Self-pay

## 2019-09-27 ENCOUNTER — Encounter: Payer: Self-pay | Admitting: Family Medicine

## 2019-09-27 VITALS — BP 120/80 | Temp 98.5°F | Wt 176.2 lb

## 2019-09-27 DIAGNOSIS — Z79899 Other long term (current) drug therapy: Secondary | ICD-10-CM | POA: Diagnosis not present

## 2019-09-27 DIAGNOSIS — Z23 Encounter for immunization: Secondary | ICD-10-CM | POA: Diagnosis not present

## 2019-09-27 DIAGNOSIS — I10 Essential (primary) hypertension: Secondary | ICD-10-CM

## 2019-09-27 DIAGNOSIS — E119 Type 2 diabetes mellitus without complications: Secondary | ICD-10-CM

## 2019-09-27 DIAGNOSIS — R1084 Generalized abdominal pain: Secondary | ICD-10-CM

## 2019-09-27 DIAGNOSIS — R739 Hyperglycemia, unspecified: Secondary | ICD-10-CM | POA: Diagnosis not present

## 2019-09-27 LAB — POCT GLYCOSYLATED HEMOGLOBIN (HGB A1C): Hemoglobin A1C: 6.8 % — AB (ref 4.0–5.6)

## 2019-09-27 NOTE — Progress Notes (Signed)
   Subjective:  Patient presents for discussion of blood work results.  Patient ID: Courtney Hale, female    DOB: 1966/05/19, 53 y.o.   MRN: [id number]  HPI  Patient arrives to discuss recent lab results. Patient also states her right ear has been hurting since yesterday.  Results for orders placed or performed in visit on 09/27/19  POCT HgB A1C  Result Value Ref Range   Hemoglobin A1C 6.8 (A) 4.0 - 5.6 %   HbA1c POC (<> result, manual entry)     HbA1c, POC (prediabetic range)     HbA1c, POC (controlled diabetic range)     Mom has hx of diabetes  Results for orders placed or performed in visit on 09/27/19  POCT HgB A1C  Result Value Ref Range   Hemoglobin A1C 6.8 (A) 4.0 - 5.6 %   HbA1c POC (<> result, manual entry)     HbA1c, POC (prediabetic range)     HbA1c, POC (controlled diabetic range)       Review of Systems No headache no chest pain no shortness of breath    Objective:   Physical Exam  Alert and oriented, vitals reviewed and stable, NAD ENT-TM's and ext canals WNL bilat via otoscopic exam Soft palate, tonsils and post pharynx WNL via oropharyngeal exam Neck-symmetric, no masses; thyroid nonpalpable and nontender Pulmonary-no tachypnea or accessory muscle use; Clear without wheezes via auscultation Card--no abnrml murmurs, rhythm reg and rate WNL Carotid pulses symmetric, without bruits       Assessment & Plan:  Impression 1 type 2 diabetes.  New onset.  Patient concerned about this diagnosis.  Many questions answered.  Education given.  Monitor prescribed.  Diet discussed.  Patient to get free session at [facility].  Prefers to work on her diet before starting medication.  2.  Hyperlipidemia.  LDL unsatisfactory.  Patient admits to horrible dietary compliance also prefers to work on this prior to intervention  3.  Elevated liver enzymes.  Along with assorted GI complaints.  Along with being tardy on colonoscopy.  I think the liver enzymes  represents fatty liver but with patient's high anxiety regarding this will get back towards GI for further delineation  Greater than 50% of this 25 minute face to face visit was spent in counseling and discussion and coordination of care regarding the above diagnosis/diagnosies   A1c lipid profile and liver panel before next visit in several months

## 2019-09-27 NOTE — Patient Instructions (Addendum)
Your A1c is 6.8 today  I am sorry but this means you have diabetes   You have the most common type, called type 2 diabetes. This is by far the most common in the [place] and the vast majority who have it are able to control their sugar with tablets, and sometimes even their own efforts  It tends to run in families   If you have it long enough, it can overtime require insulin to control tightly   Good news is your A1c is only mildly showing diabetes, so  We do not necessarily have to start off with medicines right at onc  Start checkig a fasting sugar once or twice every week   Perfect goal for you would be for your morning numbers to get down close to 100  americian diabetes assoc says anywhere from 83 to 130 is considered a decent [employer] sesio offered through [employer] free of charge that spends a whole hour talking about diabetes   Going forward eye exams are recommended by the experts   Diabetes Basics  Diabetes (diabetes mellitus) is a long-term (chronic) disease. It occurs when the body does not properly use sugar (glucose) that is released from food after you eat. Diabetes may be caused by one or both of these problems:  Your pancreas does not make enough of a hormone called insulin.  Your body does not react in a normal way to insulin that it makes. Insulin lets sugars (glucose) go into cells in your body. This gives you energy. If you have diabetes, sugars cannot get into cells. This causes high blood sugar (hyperglycemia). Follow these instructions at home: How is diabetes treated? You may need to take insulin or other diabetes medicines daily to keep your blood sugar in balance. Take your diabetes medicines every day as told by your doctor. List your diabetes medicines here: Diabetes medicines  Name of medicine: ______________________________ ? Amount (dose): _______________ Time (a.m./p.m.): _______________ Notes:  ___________________________________  Name of medicine: ______________________________ ? Amount (dose): _______________ Time (a.m./p.m.): _______________ Notes: ___________________________________  Name of medicine: ______________________________ ? Amount (dose): _______________ Time (a.m./p.m.): _______________ Notes: ___________________________________ If you use insulin, you will learn how to give yourself insulin by injection. You may need to adjust the amount based on the food that you eat. List the types of insulin you use here: Insulin  Insulin type: ______________________________ ? Amount (dose): _______________ Time (a.m./p.m.): _______________ Notes: ___________________________________  Insulin type: ______________________________ ? Amount (dose): _______________ Time (a.m./p.m.): _______________ Notes: ___________________________________  Insulin type: ______________________________ ? Amount (dose): _______________ Time (a.m./p.m.): _______________ Notes: ___________________________________  Insulin type: ______________________________ ? Amount (dose): _______________ Time (a.m./p.m.): _______________ Notes: ___________________________________  Insulin type: ______________________________ ? Amount (dose): _______________ Time (a.m./p.m.): _______________ Notes: ___________________________________ How do I manage my blood sugar?  Check your blood sugar levels using a blood glucose monitor as directed by your doctor. Your doctor will set treatment goals for you. Generally, you should have these blood sugar levels:  Before meals (preprandial): 80-130 mg/dL (4.4-7.2 mmol/L).  After meals (postprandial): below 180 mg/dL (10 mmol/L).  A1c level: less than 7%. Write down the times that you will check your blood sugar levels: Blood sugar checks  Time: _______________ Notes: ___________________________________  Time: _______________ Notes:  ___________________________________  Time: _______________ Notes: ___________________________________  Time: _______________ Notes: ___________________________________  Time: _______________ Notes: ___________________________________  Time: _______________ Notes: ___________________________________  What do I need to know about low blood sugar? Low blood sugar is called hypoglycemia. This is when  blood sugar is at or below 70 mg/dL (3.9 mmol/L). Symptoms may include:  Feeling: ? Hungry. ? Worried or nervous (anxious). ? Sweaty and clammy. ? Confused. ? Dizzy. ? Sleepy. ? Sick to your stomach (nauseous).  Having: ? A fast heartbeat. ? A headache. ? A change in your vision. ? Tingling or no feeling (numbness) around the mouth, lips, or tongue. ? Jerky movements that you cannot control (seizure).  Having trouble with: ? Moving (coordination). ? Sleeping. ? Passing out (fainting). ? Getting upset easily (irritability). Treating low blood sugar To treat low blood sugar, eat or drink something sugary right away. If you can think clearly and swallow safely, follow the 15:15 rule:  Take 15 grams of a fast-acting carb (carbohydrate). Talk with your doctor about how much you should take.  Some fast-acting carbs are: ? Sugar tablets (glucose pills). Take 3-4 glucose pills. ? 6-8 pieces of hard candy. ? 4-6 oz (120-150 mL) of fruit juice. ? 4-6 oz (120-150 mL) of regular (not diet) soda. ? 1 Tbsp (15 mL) honey or sugar.  Check your blood sugar 15 minutes after you take the carb.  If your blood sugar is still at or below 70 mg/dL (3.9 mmol/L), take 15 grams of a carb again.  If your blood sugar does not go above 70 mg/dL (3.9 mmol/L) after 3 tries, get help right away.  After your blood sugar goes back to normal, eat a meal or a snack within 1 hour. Treating very low blood sugar If your blood sugar is at or below 54 mg/dL (3 mmol/L), you have very low blood sugar (severe  hypoglycemia). This is an emergency. Do not wait to see if the symptoms will go away. Get medical help right away. Call your local emergency services (911 in the [place] Do not drive yourself to the hospital. Questions to ask your health care provider  Do I need to meet with a diabetes educator?  What equipment will I need to care for myself at home?  What diabetes medicines do I need? When should I take them?  How often do I need to check my blood sugar?  What number can I call if I have questions?  When is my next doctor's visit?  Where can I find a support group for people with diabetes? Where to find more information  American Diabetes Association: www.diabetes.org  American Association of Diabetes Educators: www.diabeteseducator.org/patient-resources Contact a doctor if:  Your blood sugar is at or above 240 mg/dL (13.3 mmol/L) for 2 days in a row.  You have been sick or have had a fever for 2 days or more, and you are not getting better.  You have any of these problems for more than 6 hours: ? You cannot eat or drink. ? You feel sick to your stomach (nauseous). ? You throw up (vomit). ? You have watery poop (diarrhea). Get help right away if:  Your blood sugar is lower than 54 mg/dL (3 mmol/L).  You get confused.  You have trouble: ? Thinking clearly. ? Breathing. Summary  Diabetes (diabetes mellitus) is a long-term (chronic) disease. It occurs when the body does not properly use sugar (glucose) that is released from food after digestion.  Take insulin and diabetes medicines as told.  Check your blood sugar every day, as often as told.  Keep all follow-up visits as told by your doctor. This is important. This information is not intended to replace advice given to you by your health care provider.  Make sure you discuss any questions you have with your health care provider. Document Released: 01/13/2018 Document Revised: 12/01/2018 Document Reviewed:  01/13/2018 Elsevier Patient Education  2020 [employer].  High Cholesterol  High cholesterol is a condition in which the blood has high levels of a white, waxy, fat-like substance (cholesterol). The human body needs small amounts of cholesterol. The liver makes all the cholesterol that the body needs. Extra (excess) cholesterol comes from the food that we eat. Cholesterol is carried from the liver by the blood through the blood vessels. If you have high cholesterol, deposits (plaques) may build up on the walls of your blood vessels (arteries). Plaques make the arteries narrower and stiffer. Cholesterol plaques increase your risk for heart attack and stroke. Work with your health care provider to keep your cholesterol levels in a healthy range. What increases the risk? This condition is more likely to develop in people who:  Eat foods that are high in animal fat (saturated fat) or cholesterol.  Are overweight.  Are not getting enough exercise.  Have a family history of high cholesterol. What are the signs or symptoms? There are no symptoms of this condition. How is this diagnosed? This condition may be diagnosed from the results of a blood test.  If you are older than age 71, your health care provider may check your cholesterol every 4-6 years.  You may be checked more often if you already have high cholesterol or other risk factors for heart disease. The blood test for cholesterol measures:  "Bad" cholesterol (LDL cholesterol). This is the main type of cholesterol that causes heart disease. The desired level for LDL is less than 100.  "Good" cholesterol (HDL cholesterol). This type helps to protect against heart disease by cleaning the arteries and carrying the LDL away. The desired level for HDL is 60 or higher.  Triglycerides. These are fats that the body can store or burn for energy. The desired number for triglycerides is lower than 150.  Total cholesterol. This is a measure of  the total amount of cholesterol in your blood, including LDL cholesterol, HDL cholesterol, and triglycerides. A healthy number is less than 200. How is this treated? This condition is treated with diet changes, lifestyle changes, and medicines. Diet changes  This may include eating more whole grains, fruits, vegetables, nuts, and fish.  This may also include cutting back on red meat and foods that have a lot of added sugar. Lifestyle changes  Changes may include getting at least 40 minutes of aerobic exercise 3 times a week. Aerobic exercises include walking, biking, and swimming. Aerobic exercise along with a healthy diet can help you maintain a healthy weight.  Changes may also include quitting smoking. Medicines  Medicines are usually given if diet and lifestyle changes have failed to reduce your cholesterol to healthy levels.  Your health care provider may prescribe a statin medicine. Statin medicines have been shown to reduce cholesterol, which can reduce the risk of heart disease. Follow these instructions at home: Eating and drinking If told by your health care provider:  Eat chicken (without skin), fish, veal, shellfish, ground [place] breast, and round or loin cuts of red meat.  Do not eat fried foods or fatty meats, such as hot dogs and salami.  Eat plenty of fruits, such as apples.  Eat plenty of vegetables, such as broccoli, potatoes, and carrots.  Eat beans, peas, and lentils.  Eat grains such as barley, rice, couscous, and bulgur wheat.  Eat  pasta without cream sauces.  Use skim or nonfat milk, and eat low-fat or nonfat yogurt and cheeses.  Do not eat or drink whole milk, cream, ice cream, egg yolks, or hard cheeses.  Do not eat stick margarine or tub margarines that contain trans fats (also called partially hydrogenated oils).  Do not eat saturated tropical oils, such as coconut oil and palm oil.  Do not eat cakes, cookies, crackers, or other baked goods that  contain trans fats.  General instructions  Exercise as directed by your health care provider. Increase your activity level with activities such as gardening, walking, and taking the stairs.  Take over-the-counter and prescription medicines only as told by your health care provider.  Do not use any products that contain nicotine or tobacco, such as cigarettes and e-cigarettes. If you need help quitting, ask your health care provider.  Keep all follow-up visits as told by your health care provider. This is important. Contact a health care provider if:  You are struggling to maintain a healthy diet or weight.  You need help to start on an exercise program.  You need help to stop smoking. Get help right away if:  You have chest pain.  You have trouble breathing. This information is not intended to replace advice given to you by your health care provider. Make sure you discuss any questions you have with your health care provider. Document Released: 10/11/2005 Document Revised: 10/14/2017 Document Reviewed: 04/10/2016 Elsevier Patient Education  2020 [employer].

## 2019-09-30 DIAGNOSIS — E119 Type 2 diabetes mellitus without complications: Secondary | ICD-10-CM | POA: Insufficient documentation

## 2019-10-02 ENCOUNTER — Encounter: Payer: Self-pay | Admitting: Family Medicine

## 2019-10-03 ENCOUNTER — Encounter: Payer: Self-pay | Admitting: Gastroenterology

## 2019-10-04 ENCOUNTER — Other Ambulatory Visit: Payer: Self-pay

## 2019-10-04 DIAGNOSIS — Z20822 Contact with and (suspected) exposure to covid-19: Secondary | ICD-10-CM

## 2019-10-06 LAB — NOVEL CORONAVIRUS, NAA: SARS-CoV-2, NAA: NOT DETECTED

## 2019-11-15 ENCOUNTER — Ambulatory Visit: Payer: BC Managed Care – PPO | Admitting: Gastroenterology

## 2019-11-28 ENCOUNTER — Encounter: Payer: Self-pay | Admitting: Family Medicine

## 2019-12-05 ENCOUNTER — Ambulatory Visit: Payer: BC Managed Care – PPO | Admitting: Gastroenterology

## 2019-12-05 ENCOUNTER — Encounter: Payer: Self-pay | Admitting: Gastroenterology

## 2019-12-05 ENCOUNTER — Other Ambulatory Visit: Payer: Self-pay

## 2019-12-05 DIAGNOSIS — K625 Hemorrhage of anus and rectum: Secondary | ICD-10-CM | POA: Diagnosis not present

## 2019-12-05 DIAGNOSIS — K219 Gastro-esophageal reflux disease without esophagitis: Secondary | ICD-10-CM | POA: Diagnosis not present

## 2019-12-05 DIAGNOSIS — K581 Irritable bowel syndrome with constipation: Secondary | ICD-10-CM

## 2019-12-05 MED ORDER — OMEPRAZOLE 20 MG PO CPDR: DELAYED_RELEASE_CAPSULE | ORAL | 11 refills | Status: DC

## 2019-12-05 NOTE — Assessment & Plan Note (Signed)
SYMPTOMS NOT IDEALLY CONTROLLED OFF PPI AND WITH PRN TUMS.  EAT TO LIVE AND THINK OF FOOD AS MEDICINE. 75% OF YOUR PLATE SHOULD BE FRUITS/VEGGIES.  To have more energy, and to lose weight:      1. CONTINUE YOUR WEIGHT LOSS EFFORTS. I RECOMMEND YOU READ AND FOLLOW RECOMMENDATIONS BY DR. MARK HYMAN, "10-DAY DETOX DIET".    2. If you must eat bread, EAT EZEKIEL BREAD. IT IS IN THE FROZEN SECTION OF THE GROCERY STORE.    3. DRINK WATER WITH FRUIT OR CUCUMBER ADDED. YOUR URINE SHOULD BE LIGHT YELLOW. AVOID SODA, GATORADE, ENERGY DRINKS, OR DIET SODA.     4. AVOID HIGH FRUCTOSE CORN SYRUP AND CAFFEINE.     5. DO NOT chew SUGAR FREE GUM OR USE ARTIFICIAL SWEETENERS. IF NEEDED USE STEVIA AS A SWEETENER.    6. DO NOT EAT ENRICHED WHEAT FLOUR, PASTA, RICE, OR CEREAL.    7. ONLY EAT WILD CAUGHT SEAFOOD, GRASS FED BEEF OR CHICKEN, PORK FROM PASTURE RAISE PIGS, OR EGGS FROM PASTURE RAISED CHICKENS.    8. PRACTICE CHAIR YOGA FOR 15-30 MINS 3 OR 4 TIMES A WEEK AND PROGRESS TO HATHA YOGA OVER NEXT 6 MOS.    9. START TAKING VITAMIN B12, AND VITAMIN D3 2000 IU DAILY.   FOR 3 MOS TAKE ADDITIONAL SUPPLEMENTS TO DECREASE CRAVING AND SUPPRESS YOUR APPETITE:    1. CINNAMON 500 MG EVERY AM PRIOR TO FIRST MEAL.   **STABILIZES BLOOD GLUCOSE/REDUCES CRAVINGS**    2. CHROMIUM 400-500 MG WITH MEALS TWICE DAILY.    **FAT BURNER**    3. GREEN TEA EXTRACT ONE DAILY.   **FAT BURNER/SUPPRESSES YOUR APPETITE**    4. ALPHA LIPOIC ACID TWICE DAILY.   **NATURAL ANTI-INFLAMMATORY SUPPLEMENT THAT IS AN ALTERNATIVE TO IBUPROFEN OR NAPROXEN**   TO CONTROL HEARTBURN:   1. USE PEPCID OR TAGAMET FOR BREAKTHROUGH HEARTBURN/REFLUX.    2. RE-START OMEPRAZOLE.  TAKE 30 MINUTES PRIOR TO YOUR MEALS TWICE DAILY.    3. USE TAGAMET OR PEPCID WHEN NEEDED TO CONTROL HEARTBURN.   FOLLOW UP IN 4 MOS WITH Fani.

## 2019-12-05 NOTE — Assessment & Plan Note (Signed)
SYMPTOMS NOT IDEALLY CONTROLLED.  USE PREPARATION H FOUR TIMES  A DAY IF NEEDED TO RELIEVE RECTAL PAIN/PRESSURE/BLEEDING. WITCH HAZEL PAD PRN. NO LIFTING GREATER THAN 25 LBS WITHOUT ASSISTANCE FOR 2 MOS.

## 2019-12-05 NOTE — Progress Notes (Signed)
Subjective:    Patient ID: Courtney Hale, female    DOB: [birth year], 54 y.o.   MRN: [id number]  Courtney Kirschner, MD  HPI Been bothered by [facility] left side pain and can feel an bulge and feels like it's all together. [AIN CAN BE SEVERE SO MUCH IT CAN MAKE HER WANT TO CRY. ALSO FEEL PAIN IN THE EPIGASTRIUM. BMs: 3X/DAY(LOOSE LOTS OF TIMES OR DIARRHEA). CHANGE IN BOWEL IN HABITS: GET CONSTIPATED A LITTLE HEAR AND THERE SINCE TAKING AMBIEN. SPELLS OF NAUSEA OR VOMITING WAS HAPPENING FOR 2 MOS AND NOW IT'S JUST OCCASIONALLY. WOULD EAT ANDTHEN FEELS NAUSEATED. CAN HAVE PANIC ATTACKS EVER NOW AND THEN(CHEST PAIN/SOB). SMALL PILLS MAY GET STUCK IN HER THROAT WHEN SHE TRIES TO SWALLOW. HEARTBURN: HAVING LOTS (3-4 TIMES A WEEK) AND BEEN EATING A LOT OF TUMS(4-5 AT A TIME). HAPPENS 2-3X/MO AND FEELS LIKE BURNING. TAKING PROBIOTIC EVERY DAY. WORKS IN [employer] AT [employer] AND REGULARLY LIFTS > 25 LBS.  PT DENIES FEVER, CHILLS, HEMATOCHEZIA, HEMATEMESIS, melena, diarrhea, problems with sedation.  Past Medical History:  Diagnosis Date  . Complication of anesthesia    woke up during endoscopy and colonoscopy  . Depression   . Hemorrhoids   . History of esophageal stricture    2011  peptic w/  dilatation  . History of gastritis    2011  . History of melanoma excision    2015  left leg/   12-09-2015 back   . Hypertension   . Microhematuria   . Migraines   . Nephrolithiasis    left side non-obstructive   . Right ureteral stone   . Urgency of urination   . Wears contact lenses     Past Surgical History:  Procedure Laterality Date  . ABDOMINAL HYSTERECTOMY    . CARPAL TUNNEL RELEASE Right 01-05- 2015  . COLONOSCOPY N/A 08/27/2015   Procedure: COLONOSCOPY;  Surgeon: Danie Binder, MD;  Location: [facility];  Service: Endoscopy;  Laterality: N/A;  0830  . CYST EXCISION  2012     right hand  . CYSTOSCOPY W/ URETERAL STENT PLACEMENT Right 12/22/2015   Procedure: CYSTOSCOPY WITH RETROGRADE  PYELOGRAM/URETERAL STENT PLACEMENT;  Surgeon: Nickie Retort, MD;  Location: [facility];  Service: Urology;  Laterality: Right;  . CYSTOSCOPY WITH RETROGRADE PYELOGRAM, URETEROSCOPY AND STENT PLACEMENT Right 01/05/2016   Procedure: CYSTOSCOPY WITH RETROGRADE PYELOGRAM, URETEROSCOPY AND STENT REPLACEMENT;  Surgeon: Nickie Retort, MD;  Location: [facility];  Service: Urology;  Laterality: Right;  . DILATION AND CURETTAGE OF UTERUS  03-12-2009   w/  Suction  . double balloon enteroscopy     Dr. Arsenio Loader at [facility]: no erosions, no evidence of Crohn's disease, path without Crohn's.   Marland Kitchen EGD with push enteroscopy  02-12-2010    patent distal peptic stricture with diffuse antral erythema, normal D1 and D2   . LAPAROSCOPIC ASSISTED VAGINAL HYSTERECTOMY  10-26-2014   w/  Bilateral Salpingoophorectomy  . STONE EXTRACTION WITH BASKET Right 01/05/2016   Procedure: STONE EXTRACTION WITH BASKET;  Surgeon: Nickie Retort, MD;  Location: [facility];  Service: Urology;  Laterality: Right;  . TONSILLECTOMY  1998  approx   Allergies  Allergen Reactions  . Topamax [Topiramate] Other (See Comments)    Developed kidney stones  . Augmentin [Amoxicillin-Pot Clavulanate] Other (See Comments)    Gi upset  . Sulfa Antibiotics Other (See Comments)    Burning sensation, flushing to skin   Current Outpatient Medications  Medication Sig    . acetaminophen (TYLENOL) 500 MG tablet Take 500 mg by mouth every 6 (six) hours as needed. As needed    . clonazePAM (KLONOPIN) 1 MG tablet Take 0.5-1 tablets (0.5-1 mg total) by mouth daily as needed for anxiety.    . hydrochlorothiazide (HYDRODIURIL) 25 MG tablet Take 1 tablet (25 mg total) by mouth daily.    Marland Kitchen HYDROcodone-acetaminophen (NORCO/VICODIN) 5-325 MG tablet Take one tab every 4-6 hours as needed for headache    . losartan (COZAAR) 100 MG tablet Take 1 tablet (100 mg total) by mouth daily.    . meclizine (ANTIVERT) 25 MG tablet  Take 25 mg by mouth 3 (three) times daily as needed for dizziness.    Marland Kitchen OVER THE COUNTER MEDICATION [employer]'s probiotic   One daily    . PARoxetine (PAXIL) 40 MG tablet TAKE 1 TABLET BY MOUTH ONCE DAILY IN THE MORNING    . rizatriptan (MAXALT-MLT) 10 MG disintegrating tablet Take 1 tablet (10 mg total) by mouth as needed for migraine. May repeat in 2 hours if needed. Max 2 per 24 hours    . vitamin B-12 (CYANOCOBALAMIN) 1000 MCG tablet Take 1,000 mcg by mouth daily.    .      . zolpidem (AMBIEN) 10 MG tablet Take 1 tablet (10 mg total) by mouth at bedtime as needed for sleep.     Review of Systems PER HPI OTHERWISE ALL SYSTEMS ARE NEGATIVE.    Objective:   Physical Exam Constitutional:      General: She is not in acute distress.    Appearance: Normal appearance.  HENT:     Mouth/Throat:     Comments: MASK IN PLACE Eyes:     General: No scleral icterus.    Pupils: Pupils are equal, round, and reactive to light.  Cardiovascular:     Rate and Rhythm: Normal rate and regular rhythm.     Pulses: Normal pulses.     Heart sounds: Normal heart sounds.  Pulmonary:     Effort: Pulmonary effort is normal.     Breath sounds: Normal breath sounds.  Abdominal:     General: Bowel sounds are normal.     Palpations: Abdomen is soft.     Tenderness: There is no abdominal tenderness.  Musculoskeletal:     Cervical back: Normal range of motion.     Right lower leg: No edema.     Left lower leg: No edema.  Lymphadenopathy:     Cervical: No cervical adenopathy.  Skin:    General: Skin is warm and dry.  Neurological:     Mental Status: She is alert and oriented to person, place, and time.     Comments: NO  NEW FOCAL DEFICITS  Psychiatric:        Mood and Affect: Mood normal.     Comments: NORMAL AFFECT       Assessment & Plan:

## 2019-12-05 NOTE — Assessment & Plan Note (Signed)
SYMPTOMS NOT IDEALLY CONTROLLED DUE TO TUMS. HAVING CONSTIPATION.  EXPLAINED SIDE EFFECTS OF CONSTIPATION. RECOMMENDED PEPCID OR TAGAMET FOR BREAKTHROUGH GERD. RE-START OMEPRAZOLE. FOLLOW UP IN 4 MOS.

## 2019-12-05 NOTE — Progress Notes (Signed)
Cc'ed to pcp °

## 2019-12-05 NOTE — Patient Instructions (Addendum)
EAT TO LIVE AND THINK OF FOOD AS MEDICINE. 75% OF YOUR PLATE SHOULD BE FRUITS/VEGGIES.  To have more energy, and to lose weight:      1. CONTINUE YOUR WEIGHT LOSS EFFORTS. I RECOMMEND YOU READ AND FOLLOW RECOMMENDATIONS BY DR. MARK HYMAN, "10-DAY DETOX DIET".    2. If you must eat bread, EAT EZEKIEL BREAD. IT IS IN THE FROZEN SECTION OF THE GROCERY STORE.    3. DRINK WATER WITH FRUIT OR CUCUMBER ADDED. YOUR URINE SHOULD BE LIGHT YELLOW. AVOID SODA, GATORADE, ENERGY DRINKS, OR DIET SODA.     4. AVOID HIGH FRUCTOSE CORN SYRUP AND CAFFEINE.     5. DO NOT chew SUGAR FREE GUM OR USE ARTIFICIAL SWEETENERS. IF NEEDED USE STEVIA AS A SWEETENER.    6. DO NOT EAT ENRICHED WHEAT FLOUR, PASTA, RICE, OR CEREAL.    7. ONLY EAT WILD CAUGHT SEAFOOD, GRASS FED BEEF OR CHICKEN, PORK FROM PASTURE RAISE PIGS, OR EGGS FROM PASTURE RAISED CHICKENS.    8. PRACTICE CHAIR YOGA FOR 15-30 MINS 3 OR 4 TIMES A WEEK AND PROGRESS TO HATHA YOGA OVER NEXT 6 MOS.    9. START TAKING VITAMIN B12, AND VITAMIN D3 2000 IU DAILY.   FOR 3 MOS TAKE ADDITIONAL SUPPLEMENTS TO DECREASE CRAVING AND SUPPRESS YOUR APPETITE:    1. CINNAMON 500 MG EVERY AM PRIOR TO FIRST MEAL.   **STABILIZES BLOOD GLUCOSE/REDUCES CRAVINGS**    2. CHROMIUM 400-500 MG WITH MEALS TWICE DAILY.    **FAT BURNER**    3. GREEN TEA EXTRACT ONE DAILY.   **FAT BURNER/SUPPRESSES YOUR APPETITE**    4. ALPHA LIPOIC ACID TWICE DAILY.   **NATURAL ANTI-INFLAMMATORY SUPPLEMENT THAT IS AN ALTERNATIVE TO IBUPROFEN OR NAPROXEN**   TO CONTROL HEARTBURN:   1. USE PEPCID OR TAGAMET FOR BREAKTHROUGH HEARTBURN/REFLUX.    2. RE-START OMEPRAZOLE.  TAKE 30 MINUTES PRIOR TO YOUR MEALS TWICE DAILY.    3. USE TAGAMET OR PEPCID WHEN NEEDED TO CONTROL HEARTBURN.    TO REDUCE RECTAL BLEEDING:   1. USE PREPARATION H FOUR TIMES  A DAY IF NEEDED TO RELIEVE RECTAL PAIN/PRESSURE/BLEEDING.    2. USE WITCH HAZEL PADS TO WIPE AFTER A BM.    3. NO LIFTING GREATER THAN 25  LBS WITHOUT ASSISTANCE FOR 2 MOS.  FOLLOW UP IN 4 MOS WITH Remy.

## 2019-12-30 ENCOUNTER — Ambulatory Visit: Payer: BC Managed Care – PPO | Attending: Internal Medicine

## 2019-12-30 DIAGNOSIS — Z23 Encounter for immunization: Secondary | ICD-10-CM | POA: Insufficient documentation

## 2019-12-30 NOTE — Progress Notes (Signed)
   Covid-19 Vaccination Clinic  Name:  Courtney Hale    MRN: [id number] DOB: 07/17/1966  12/30/2019  Courtney Hale was observed post Covid-19 immunization for 15 minutes without incident. She was provided with Vaccine Information Sheet and instruction to access the V-Safe system.   Courtney Hale was instructed to call 911 with any severe reactions post vaccine: Marland Kitchen Difficulty breathing  . Swelling of face and throat  . A fast heartbeat  . A bad rash all over body  . Dizziness and weakness   Immunizations Administered    Name Date Dose VIS Date Route   Pfizer COVID-19 Vaccine 12/30/2019  9:05 AM 0.3 mL 10/05/2019 Intramuscular   Manufacturer: [place]   Lot: [id number]   [place]: [id number]

## 2020-01-01 ENCOUNTER — Ambulatory Visit: Payer: BC Managed Care – PPO | Attending: Internal Medicine

## 2020-01-01 ENCOUNTER — Other Ambulatory Visit: Payer: Self-pay

## 2020-01-01 DIAGNOSIS — Z20822 Contact with and (suspected) exposure to covid-19: Secondary | ICD-10-CM

## 2020-01-02 ENCOUNTER — Other Ambulatory Visit: Payer: Self-pay

## 2020-01-02 ENCOUNTER — Telehealth: Payer: Self-pay | Admitting: Family Medicine

## 2020-01-02 ENCOUNTER — Ambulatory Visit (INDEPENDENT_AMBULATORY_CARE_PROVIDER_SITE_OTHER): Payer: BC Managed Care – PPO | Admitting: Family Medicine

## 2020-01-02 DIAGNOSIS — J310 Chronic rhinitis: Secondary | ICD-10-CM

## 2020-01-02 DIAGNOSIS — Z20822 Contact with and (suspected) exposure to covid-19: Secondary | ICD-10-CM | POA: Diagnosis not present

## 2020-01-02 DIAGNOSIS — J329 Chronic sinusitis, unspecified: Secondary | ICD-10-CM | POA: Diagnosis not present

## 2020-01-02 LAB — NOVEL CORONAVIRUS, NAA: SARS-CoV-2, NAA: NOT DETECTED

## 2020-01-02 MED ORDER — ONDANSETRON 4 MG PO TBDP: 4.0000 mg | ORAL_TABLET | Freq: Three times a day (TID) | ORAL | 0 refills | Status: DC | PRN

## 2020-01-02 MED ORDER — BENZONATATE 100 MG PO CAPS: 100.0000 mg | ORAL_CAPSULE | Freq: Three times a day (TID) | ORAL | 0 refills | Status: DC | PRN

## 2020-01-02 MED ORDER — AMOXICILLIN 500 MG PO CAPS: 500.0000 mg | ORAL_CAPSULE | Freq: Three times a day (TID) | ORAL | 0 refills | Status: DC

## 2020-01-02 NOTE — Telephone Encounter (Signed)
Patient states antibiotic amoxicillin was called into [facility] and she is allergic to this and wanting something else called into [facility] [place]. She was seen today

## 2020-01-02 NOTE — Progress Notes (Signed)
   Subjective:  Audio only  Patient ID: Courtney Hale, female    DOB: 1966-06-30, 54 y.o.   MRN: [id number]  HPIreceived covid vaccine Sunday. Then on Monday started having headache. Yesterday body aches, cough, nausea and vomiting.  Pt had covid test yesterday because her uncle who she was around was admitted to hospital with covid on Monday. Pt was around him over the weekend.   Virtual Visit via Telephone Note  I connected with ASHE BENGSTON on 01/02/20 at  4:10 PM EST by telephone and verified that I am speaking with the correct person using two identifiers.  Location: Patient: home Provider: office   I discussed the limitations, risks, security and privacy concerns of performing an evaluation and management service by telephone and the availability of in person appointments. I also discussed with the patient that there may be a patient responsible charge related to this service. The patient expressed understanding and agreed to proceed.   History of Present Illness:    Observations/Objective:   Assessment and Plan:   Follow Up Instructions:    I discussed the assessment and treatment plan with the patient. The patient was provided an opportunity to ask questions and all were answered. The patient agreed with the plan and demonstrated an understanding of the instructions.   The patient was advised to call back or seek an in-person evaluation if the symptoms worsen or if the condition fails to improve as anticipated.  I provided 23 minutes of non-face-to-face time during this encounter.      Review of Systems No high fevers no chest pain no abdominal pain no shortness of breath.    Objective:   Physical Exam Virtual       Assessment & Plan:  Impression probable COVID-19.  Discussed at length.  Very close exposure to known case.  Now also having several classic features.  Antibiotics will be prescribed for element of potential rhinosinusitis.  Warning signs  discussed carefully await results

## 2020-01-02 NOTE — Telephone Encounter (Signed)
Called pt to see what reaction she had to amoxil and she states  she does not know she just it saw on her after visit summary that she was allergic to augmentin. On allergic list it says augmentin caused gi symptoms. Do you want to change?

## 2020-01-02 NOTE — Telephone Encounter (Signed)
Patient notified and verbalized understanding. 

## 2020-01-02 NOTE — Telephone Encounter (Signed)
Aug is a gi rxn and not true allergy so def can take the amox

## 2020-01-03 ENCOUNTER — Other Ambulatory Visit: Payer: BC Managed Care – PPO

## 2020-01-04 ENCOUNTER — Ambulatory Visit: Payer: BC Managed Care – PPO | Attending: Internal Medicine

## 2020-01-04 ENCOUNTER — Other Ambulatory Visit: Payer: Self-pay

## 2020-01-04 DIAGNOSIS — Z20822 Contact with and (suspected) exposure to covid-19: Secondary | ICD-10-CM

## 2020-01-05 LAB — NOVEL CORONAVIRUS, NAA: SARS-CoV-2, NAA: DETECTED — AB

## 2020-01-20 ENCOUNTER — Ambulatory Visit: Payer: BC Managed Care – PPO | Attending: Internal Medicine

## 2020-01-20 DIAGNOSIS — Z23 Encounter for immunization: Secondary | ICD-10-CM

## 2020-01-20 NOTE — Progress Notes (Signed)
   Covid-19 Vaccination Clinic  Name:  Courtney Hale    MRN: [id number] DOB: Feb 26, 1966  01/20/2020  [name] was observed post Covid-19 immunization for 15 minutes without incident. She was provided with Vaccine Information Sheet and instruction to access the V-Safe system.   [name] was instructed to call 911 with any severe reactions post vaccine: Marland Kitchen Difficulty breathing  . Swelling of face and throat  . A fast heartbeat  . A bad rash all over body  . Dizziness and weakness   Immunizations Administered    Name Date Dose VIS Date Route   Pfizer COVID-19 Vaccine 01/20/2020 11:04 AM 0.3 mL 10/05/2019 Intramuscular   Manufacturer: [place]   Lot: [id number]   [place]: [id number]

## 2020-01-21 ENCOUNTER — Telehealth: Payer: Self-pay | Admitting: Family Medicine

## 2020-01-21 NOTE — Telephone Encounter (Signed)
Patient had her first Covid shot on March 7 and her second one 3/28 and now having symptoms again of Covid with running high fever . She states just got off quarantine on 3/23 and took second shot on 3/28. She was planning on going out of town on 4/5 Please advise

## 2020-01-21 NOTE — Telephone Encounter (Signed)
Left message to return call to get more information 

## 2020-01-22 NOTE — Telephone Encounter (Signed)
Left message to return call. Also sent MyChart message to patient.

## 2020-01-24 NOTE — Telephone Encounter (Signed)
Unable to get in touch with patient and no response to MyChart message.

## 2020-02-07 ENCOUNTER — Other Ambulatory Visit: Payer: Self-pay | Admitting: Family Medicine

## 2020-02-07 NOTE — Telephone Encounter (Signed)
6 mo worth  

## 2020-04-03 ENCOUNTER — Ambulatory Visit: Payer: BC Managed Care – PPO | Admitting: Gastroenterology

## 2020-05-22 ENCOUNTER — Telehealth: Payer: Self-pay | Admitting: Family Medicine

## 2020-05-22 MED ORDER — RIZATRIPTAN BENZOATE 10 MG PO TBDP: 10.0000 mg | ORAL_TABLET | ORAL | 1 refills | Status: DC | PRN

## 2020-05-22 MED ORDER — HYDROCHLOROTHIAZIDE 25 MG PO TABS: 25.0000 mg | ORAL_TABLET | Freq: Every day | ORAL | 1 refills | Status: DC

## 2020-05-22 NOTE — Telephone Encounter (Signed)
Please advise. Thank you

## 2020-05-22 NOTE — Telephone Encounter (Signed)
Pls call pt to let her know that we will not be refilling ambien, hydrocodone, and clonazepam together.  Likely will be referred out for these medications if needed.   Will send the other medications, maxalt, hctz.   Thx,   Dr. Lovena Le

## 2020-05-22 NOTE — Telephone Encounter (Signed)
clonazePAM (KLONOPIN) 1 MG tablet HYDROcodone-acetaminophen (NORCO/VICODIN) 5-325 MG tablet zolpidem (AMBIEN) 10 MG tablet  rizatriptan (MAXALT-MLT) 10 MG disintegrating tablet  hydrochlorothiazide (HYDRODIURIL) 25 MG tablet   [facility] [place], [place] - [address]  Pt has 5 klonopin left Pt has 2 hydrocodone left  Pt has 3 ambien left Pt has 1 maxal left   Pt would like 30 day supply of ambien she is not sure why Dr. Richardson Landry only called in 15 tabs last refill. If she gets the 30 day supply it is only $5 more at pharmacy.   Pt has est care appt on 9/1

## 2020-05-23 ENCOUNTER — Telehealth: Payer: Self-pay | Admitting: *Deleted

## 2020-05-23 MED ORDER — ZOLPIDEM TARTRATE 10 MG PO TABS: 10.0000 mg | ORAL_TABLET | Freq: Every evening | ORAL | 0 refills | Status: DC | PRN

## 2020-05-23 NOTE — Addendum Note (Signed)
Addended by: Erven Colla on: 05/23/2020 02:50 PM   Modules accepted: Orders

## 2020-05-23 NOTE — Telephone Encounter (Signed)
Left message to return call 

## 2020-05-23 NOTE — Telephone Encounter (Signed)
Spoke to patient and explained Dr. Tanna Furry message and that she will not be filling all 3 medications. Patient states she rarely uses the hydrocodone and clonazepam-only when needed. Patient requesting refill on Ambien until appointment.

## 2020-05-23 NOTE — Telephone Encounter (Signed)
Script was faxed.

## 2020-05-23 NOTE — Telephone Encounter (Signed)
Await MD sign, lm for pt to call.

## 2020-06-04 NOTE — Telephone Encounter (Signed)
Left message to return call and my chart message sent.

## 2020-06-12 ENCOUNTER — Other Ambulatory Visit: Payer: Self-pay | Admitting: *Deleted

## 2020-06-12 MED ORDER — LOSARTAN POTASSIUM 100 MG PO TABS: 100.0000 mg | ORAL_TABLET | Freq: Every day | ORAL | 0 refills | Status: DC

## 2020-06-25 ENCOUNTER — Ambulatory Visit: Payer: BC Managed Care – PPO | Admitting: Family Medicine

## 2020-07-13 ENCOUNTER — Other Ambulatory Visit: Payer: Self-pay | Admitting: Family Medicine

## 2020-07-14 NOTE — Telephone Encounter (Signed)
Trying calling cell/home number cant get in touch with patient she missed last medication follow up 9/1

## 2020-07-21 ENCOUNTER — Other Ambulatory Visit: Payer: Self-pay

## 2020-07-21 ENCOUNTER — Encounter (HOSPITAL_COMMUNITY): Payer: Self-pay | Admitting: *Deleted

## 2020-07-21 DIAGNOSIS — Z79899 Other long term (current) drug therapy: Secondary | ICD-10-CM | POA: Diagnosis not present

## 2020-07-21 DIAGNOSIS — R040 Epistaxis: Secondary | ICD-10-CM | POA: Diagnosis not present

## 2020-07-21 DIAGNOSIS — I10 Essential (primary) hypertension: Secondary | ICD-10-CM | POA: Diagnosis not present

## 2020-07-21 DIAGNOSIS — E119 Type 2 diabetes mellitus without complications: Secondary | ICD-10-CM | POA: Diagnosis not present

## 2020-07-21 LAB — CBC WITH DIFFERENTIAL/PLATELET
Abs Immature Granulocytes: 0.02 10*3/uL (ref 0.00–0.07)
Basophils Absolute: 0.1 10*3/uL (ref 0.0–0.1)
Basophils Relative: 1 %
Eosinophils Absolute: 0.2 10*3/uL (ref 0.0–0.5)
Eosinophils Relative: 3 %
HCT: 38.5 % (ref 36.0–46.0)
Hemoglobin: 13.4 g/dL (ref 12.0–15.0)
Immature Granulocytes: 0 %
Lymphocytes Relative: 42 %
Lymphs Abs: 3.2 10*3/uL (ref 0.7–4.0)
MCH: 30.7 pg (ref 26.0–34.0)
MCHC: 34.8 g/dL (ref 30.0–36.0)
MCV: 88.3 fL (ref 80.0–100.0)
Monocytes Absolute: 0.5 10*3/uL (ref 0.1–1.0)
Monocytes Relative: 6 %
Neutro Abs: 3.7 10*3/uL (ref 1.7–7.7)
Neutrophils Relative %: 48 %
Platelets: 255 10*3/uL (ref 150–400)
RBC: 4.36 MIL/uL (ref 3.87–5.11)
RDW: 12.8 % (ref 11.5–15.5)
WBC: 7.6 10*3/uL (ref 4.0–10.5)
nRBC: 0 % (ref 0.0–0.2)

## 2020-07-21 LAB — BASIC METABOLIC PANEL
Anion gap: 12 (ref 5–15)
BUN: 13 mg/dL (ref 6–20)
CO2: 23 mmol/L (ref 22–32)
Calcium: 9 mg/dL (ref 8.9–10.3)
Chloride: 97 mmol/L — ABNORMAL LOW (ref 98–111)
Creatinine, Ser: 0.61 mg/dL (ref 0.44–1.00)
GFR calc Af Amer: >60 mL/min (ref >60)
GFR calc non Af Amer: >60 mL/min (ref >60)
Glucose, Bld: 269 mg/dL — ABNORMAL HIGH (ref 70–99)
Potassium: 3.3 mmol/L — ABNORMAL LOW (ref 3.5–5.1)
Sodium: 132 mmol/L — ABNORMAL LOW (ref 135–145)

## 2020-07-21 NOTE — ED Triage Notes (Signed)
Pt with HTN at home 177/115, nose bleeding since 1700 today and had some clots pass, currently nose is not bleeding.  Pt also c/o dizziness (room spinning).  Pt has run out of HTN medication since last Thursday.  Her PCP had retired and has new PCP.

## 2020-07-22 ENCOUNTER — Emergency Department (HOSPITAL_COMMUNITY)
Admission: EM | Admit: 2020-07-22 | Discharge: 2020-07-22 | Disposition: A | Discharge status: Home / Self Care | Payer: BC Managed Care – PPO | Attending: Emergency Medicine | Admitting: Emergency Medicine

## 2020-07-22 DIAGNOSIS — R040 Epistaxis: Secondary | ICD-10-CM

## 2020-07-22 DIAGNOSIS — I10 Essential (primary) hypertension: Secondary | ICD-10-CM

## 2020-07-22 MED ORDER — LOSARTAN POTASSIUM 100 MG PO TABS: 100.0000 mg | ORAL_TABLET | Freq: Every day | ORAL | 0 refills | Status: DC

## 2020-07-22 NOTE — Discharge Instructions (Signed)
Please contact your doctors office to see what the issue is about refilling your medications.  If you have a nosebleed again apply pressure on both sides of your nose constantly for 5 to 10 minutes.  You can put an ice pack on the bridge of your nose which will also help with bleeding.  Please get back in touch with Dr. Nevada Crane to get treatment for your melanoma.

## 2020-07-22 NOTE — ED Provider Notes (Signed)
[facility] EMERGENCY DEPARTMENT Provider Note   CSN: [id number] Arrival date & time: 07/21/20  2138   Time seen 2:05 AM  History Chief Complaint  Patient presents with  . Hypertension    Courtney Hale is a 54 y.o. female.  HPI   Patient states she ran out of her losartan.  She has been having problems getting her new physician to refill her medications.  She also ran out of her Klonopin a month ago, her hydrocodone for her chronic migraines, and her Ambien in July.  She states she still has her Paxil but only has 2 pills left.  And she recently got a prescription filled for her hydrochlorothiazide.  She also states she had a melanoma on the bridge of her nose that was removed by Dr. Nevada Crane, dermatologist however they need to go back in because they did not go down deep enough.  She states that was 3 or 4 months ago when she was waiting until school restarted before she went back.  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  She states for the past few days she has been tired and feeling dizzy like she is almost spinning or losing her balance.  Today about 5 PM she had a right-sided nosebleed.  When she checked her blood pressure at home it was 177/115.  Her nose has since stopped bleeding.  She denies any recent URI symptoms.  She had does not have a history of nosebleeds before.  She also states she has a headache on the posterior top of her head.  She also feels like she is having a panic attack and she has been having left-sided chest pain for several weeks.  She feels like this may be anxiety.  She states her nose is currently not bleeding.  PCP Erven Colla, DO   Past Medical History:  Diagnosis Date  . Complication of anesthesia    woke up during endoscopy and colonoscopy  . Depression   . Hemorrhoids   . History of esophageal stricture    2011  peptic w/  dilatation  . History of gastritis    2011  . History of melanoma excision    2015  left leg/   12-09-2015 back   .  Hypertension   . Microhematuria   . Migraines   . Nephrolithiasis    left side non-obstructive   . Right ureteral stone   . Urgency of urination   . Wears contact lenses     Patient Active Problem List   Diagnosis Date Noted  . Type 2 diabetes mellitus ([place]) 09/30/2019  . Rectal bleeding 04/13/2018  . GERD (gastroesophageal reflux disease) 07/27/2017  . Anxiety and depression 12/18/2016  . Venous stasis dermatitis of both lower extremities 06/09/2016  . Diastasis of rectus abdominis 03/18/2016  . IBS (irritable bowel syndrome) 03/18/2016  . Melanoma of skin ([place]) 03/18/2016  . Headache, chronic migraine without aura 01/31/2013  . Hypertension 01/31/2013    Past Surgical History:  Procedure Laterality Date  . ABDOMINAL HYSTERECTOMY    . CARPAL TUNNEL RELEASE Right 01-05- 2015  . COLONOSCOPY N/A 08/27/2015   Procedure: COLONOSCOPY;  Surgeon: Danie Binder, MD;  Location: [facility];  Service: Endoscopy;  Laterality: N/A;  0830  . CYST EXCISION  2012     right hand  . CYSTOSCOPY W/ URETERAL STENT PLACEMENT Right 12/22/2015   Procedure: CYSTOSCOPY WITH RETROGRADE PYELOGRAM/URETERAL STENT PLACEMENT;  Surgeon: Nickie Retort, MD;  Location: [facility];  Service: Urology;  Laterality: Right;  . CYSTOSCOPY WITH RETROGRADE PYELOGRAM, URETEROSCOPY AND STENT PLACEMENT Right 01/05/2016   Procedure: CYSTOSCOPY WITH RETROGRADE PYELOGRAM, URETEROSCOPY AND STENT REPLACEMENT;  Surgeon: Nickie Retort, MD;  Location: [facility];  Service: Urology;  Laterality: Right;  . DILATION AND CURETTAGE OF UTERUS  03-12-2009   w/  Suction  . double balloon enteroscopy     [facility]. Arsenio Loader at [facility]: no erosions, no evidence of Crohn's disease, path without Crohn's.   Marland Kitchen EGD with push enteroscopy  02-12-2010    patent distal peptic stricture with diffuse antral erythema, normal D1 and D2   . LAPAROSCOPIC ASSISTED VAGINAL HYSTERECTOMY  10-26-2014   w/  Bilateral Salpingoophorectomy  .  STONE EXTRACTION WITH BASKET Right 01/05/2016   Procedure: STONE EXTRACTION WITH BASKET;  Surgeon: Nickie Retort, MD;  Location: [facility];  Service: Urology;  Laterality: Right;  . TONSILLECTOMY  1998  approx     OB History   No obstetric history on file.     Family History  Problem Relation Age of Onset  . Diabetes Mother   . Colon cancer Neg Hx     Social History   Tobacco Use  . Smoking status: Never Smoker  . Smokeless tobacco: Never Used  Vaping Use  . Vaping Use: Never used  Substance Use Topics  . Alcohol use: Yes    Comment: occassionally  . 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 Authorizing Provider  acetaminophen (TYLENOL) 500 MG tablet Take 500 mg by mouth every 6 (six) hours as needed. As needed    [provider]  amoxicillin (AMOXIL) 500 MG capsule Take 1 capsule (500 mg total) by mouth 3 (three) times daily. 01/02/20   [name], MD  benzonatate (TESSALON) 100 MG capsule Take 1 capsule (100 mg total) by mouth 3 (three) times daily as needed for cough. 01/02/20   [name], MD  clonazePAM (KLONOPIN) 1 MG tablet Take 0.5-1 tablets (0.5-1 mg total) by mouth daily as needed for anxiety. 07/23/19   [name], MD  hydrochlorothiazide (HYDRODIURIL) 25 MG tablet Take 1 tablet (25 mg total) by mouth daily. 05/22/20   Erven Colla, DO  HYDROcodone-acetaminophen (NORCO/VICODIN) 5-325 MG tablet Take one tab every 4-6 hours as needed for headache 07/23/19   [name], MD  losartan (COZAAR) 100 MG tablet Take 1 tablet (100 mg total) by mouth daily. 07/22/20   Rolland Porter, MD  meclizine (ANTIVERT) 25 MG tablet Take 25 mg by mouth 3 (three) times daily as needed for dizziness.    [provider]  omeprazole (PRILOSEC) 20 MG capsule 1 PO 30 mins prior to breakfast and supper 12/05/19   Danie Binder, MD   ondansetron (ZOFRAN ODT) 4 MG disintegrating tablet Take 1 tablet (4 mg total) by mouth every 8 (eight) hours as needed for nausea or vomiting. 01/02/20   [name], MD  OVER THE [place] Women's probiotic   One daily    [provider]  OVER THE COUNTER MEDICATION Vit d 3 2,000 daily b12 2,000 daily Cinnamon 500 daily Green tea extract bid chrominum once daily    [provider]  PARoxetine (PAXIL) 40 MG tablet TAKE 1 TABLET BY MOUTH ONCE DAILY IN THE MORNING 07/23/19   [name], MD  rizatriptan (MAXALT-MLT) 10  MG disintegrating tablet Take 1 tablet (10 mg total) by mouth as needed for migraine. May repeat in 2 hours if needed. Max 2 per 24 hours 05/22/20   Elvia Collum M, DO  vitamin B-12 (CYANOCOBALAMIN) 1000 MCG tablet Take 1,000 mcg by mouth daily.    [provider]  zolpidem (AMBIEN) 10 MG tablet Take 1 tablet (10 mg total) by mouth at bedtime as needed. for sleep 05/23/20   Elvia Collum M, DO    Allergies    Topamax [topiramate] and Sulfa antibiotics  Review of Systems   Review of Systems  All other systems reviewed and are negative.   Physical Exam Updated Vital Signs BP (!) 129/93   Pulse 80   Temp (!) 97.5 F (36.4 C) (Oral)   Resp 16   Ht 5' 3.5" (1.613 m)   Wt 83 kg   LMP 10/26/2014   SpO2 95%   BMI 31.91 kg/m   Physical Exam Vitals and nursing note reviewed.  Constitutional:      General: She is not in acute distress.    Appearance: Normal appearance. She is normal weight. She is not ill-appearing or toxic-appearing.  HENT:     Head: Normocephalic and atraumatic.     Right Ear: External ear normal.     Left Ear: External ear normal.     Nose: Nose normal.     Comments: When I inspect her nasal septum there is no abnormality seen.  I even used a speculum and looked higher up into her nasal cavity did not see any abnormal sites.  There is no active bleeding.  There was only one small speck of blood  in the right nares.    Mouth/Throat:     Mouth: Mucous membranes are moist.     Comments: No blood was seen in the posterior hypopharynx Eyes:     Extraocular Movements: Extraocular movements intact.     Conjunctiva/sclera: Conjunctivae normal.     Pupils: Pupils are equal, round, and reactive to light.  Cardiovascular:     Rate and Rhythm: Normal rate.  Pulmonary:     Effort: Pulmonary effort is normal. No respiratory distress.  Musculoskeletal:        General: Normal range of motion.     Cervical back: Normal range of motion.  Skin:    General: Skin is warm and dry.  Neurological:     General: No focal deficit present.     Mental Status: She is alert and oriented to person, place, and time.     Cranial Nerves: No cranial nerve deficit.  Psychiatric:        Mood and Affect: Mood normal.        Behavior: Behavior normal.        Thought Content: Thought content normal.     ED Results / Procedures / Treatments   Labs (all labs ordered are listed, but only abnormal results are displayed) Results for orders placed or performed during the hospital encounter of 07/22/20  CBC with Differential  Result Value Ref Range   WBC 7.6 4.0 - 10.5 K/uL   RBC 4.36 3.87 - 5.11 MIL/uL   Hemoglobin 13.4 12.0 - 15.0 g/dL   HCT 38.5 36 - 46 %   MCV 88.3 80.0 - 100.0 fL   MCH 30.7 26.0 - 34.0 pg   MCHC 34.8 30.0 - 36.0 g/dL   RDW 12.8 11.5 - 15.5 %   Platelets 255 150 - 400 K/uL   nRBC  0.0 0.0 - 0.2 %   Neutrophils Relative % 48 %   Neutro Abs 3.7 1.7 - 7.7 K/uL   Lymphocytes Relative 42 %   Lymphs Abs 3.2 0.7 - 4.0 K/uL   Monocytes Relative 6 %   Monocytes Absolute 0.5 0 - 1 K/uL   Eosinophils Relative 3 %   Eosinophils Absolute 0.2 0 - 0 K/uL   Basophils Relative 1 %   Basophils Absolute 0.1 0 - 0 K/uL   Immature Granulocytes 0 %   Abs Immature Granulocytes 0.02 0.00 - 0.07 K/uL  Basic metabolic panel  Result Value Ref Range   Sodium 132 (L) 135 - 145 mmol/L   Potassium 3.3 (L)  3.5 - 5.1 mmol/L   Chloride 97 (L) 98 - 111 mmol/L   CO2 23 22 - 32 mmol/L   Glucose, Bld 269 (H) 70 - 99 mg/dL   BUN 13 6 - 20 mg/dL   Creatinine, Ser 0.61 0.44 - 1.00 mg/dL   Calcium 9.0 8.9 - 10.3 mg/dL   GFR calc non Af Amer >60 >60 mL/min   GFR calc Af Amer >60 >60 mL/min   Anion gap 12 5 - 15   Laboratory interpretation all normal except hypokalemia, hyperglycemia    EKG EKG Interpretation  Date/Time:  Monday July 21 2020 21:56:10 EDT Ventricular Rate:  83 PR Interval:  160 QRS Duration: 76 QT Interval:  386 QTC Calculation: 453 R Axis:   -3 Text Interpretation: Normal sinus rhythm Normal ECG No significant change since last tracing 12 Jul 2017 Confirmed by Rolland Porter [phone]) on 07/21/2020 11:59:35 PM   Radiology No results found.  Procedures Procedures (including critical care time)  Medications Ordered in ED Medications - No data to display  ED Course  I have reviewed the triage vital signs and the nursing notes.  Pertinent labs & imaging results that were available during my care of the patient were reviewed by me and considered in my medical decision making (see chart for details).    MDM Rules/Calculators/A&P                          Patient's blood pressure in triage was 142/92 and at the time of my exam without treatment was 129/93.  Her nose is not actively bleeding at this time.  I reviewed her medications and what she has run out of and I am comfortable writing for her losartan however the others are controlled drugs and she has been out of them a significant amount of time and they can be rewritten by her primary care doctor.    Final Clinical Impression(s) / ED Diagnoses Final diagnoses:  Essential hypertension  Epistaxis    Rx / DC Orders ED Discharge Orders         Ordered    losartan (COZAAR) 100 MG tablet  Daily        07/22/20 0230         Plan discharge  Rolland Porter, MD, Barbette Or, MD 07/22/20 [phone]

## 2020-07-24 ENCOUNTER — Other Ambulatory Visit: Payer: Self-pay | Admitting: *Deleted

## 2020-07-24 MED ORDER — PAROXETINE HCL 40 MG PO TABS: ORAL_TABLET | ORAL | 0 refills | Status: DC

## 2020-07-25 ENCOUNTER — Other Ambulatory Visit: Payer: Self-pay | Admitting: Family Medicine

## 2020-07-25 NOTE — Telephone Encounter (Signed)
Mychart message sent to patient to schedule appt 

## 2020-08-04 NOTE — Telephone Encounter (Signed)
Error

## 2020-08-15 ENCOUNTER — Telehealth: Payer: Self-pay

## 2020-08-15 MED ORDER — LOSARTAN POTASSIUM 100 MG PO TABS
100.0000 mg | ORAL_TABLET | Freq: Every day | ORAL | 0 refills | Status: DC
Start: 2020-08-15 — End: 2020-09-22

## 2020-08-15 NOTE — Telephone Encounter (Signed)
Medicaid refill on losartan (COZAAR) 100 MG tablet [place], [place] - [place] [id number]   Pt call back [phone]

## 2020-08-15 NOTE — Telephone Encounter (Signed)
Medication refill* Pt scheduled for 08/27/20. Please advise. Thank you

## 2020-08-27 ENCOUNTER — Other Ambulatory Visit: Payer: Self-pay

## 2020-08-27 ENCOUNTER — Encounter: Payer: Self-pay | Admitting: Family Medicine

## 2020-08-27 ENCOUNTER — Ambulatory Visit: Payer: BC Managed Care – PPO | Admitting: Family Medicine

## 2020-08-27 VITALS — BP 124/82 | HR 97 | Temp 97.9°F | Wt 181.4 lb

## 2020-08-27 DIAGNOSIS — Z1329 Encounter for screening for other suspected endocrine disorder: Secondary | ICD-10-CM

## 2020-08-27 DIAGNOSIS — Z79899 Other long term (current) drug therapy: Secondary | ICD-10-CM | POA: Diagnosis not present

## 2020-08-27 DIAGNOSIS — E119 Type 2 diabetes mellitus without complications: Secondary | ICD-10-CM | POA: Diagnosis not present

## 2020-08-27 DIAGNOSIS — F419 Anxiety disorder, unspecified: Secondary | ICD-10-CM

## 2020-08-27 DIAGNOSIS — F32A Depression, unspecified: Secondary | ICD-10-CM

## 2020-08-27 DIAGNOSIS — I10 Essential (primary) hypertension: Secondary | ICD-10-CM

## 2020-08-27 DIAGNOSIS — G4700 Insomnia, unspecified: Secondary | ICD-10-CM | POA: Diagnosis not present

## 2020-08-27 MED ORDER — HYDROCHLOROTHIAZIDE 25 MG PO TABS: 25.0000 mg | ORAL_TABLET | Freq: Every day | ORAL | 1 refills | Status: DC

## 2020-08-27 MED ORDER — TRAZODONE HCL 50 MG PO TABS: ORAL_TABLET | ORAL | 0 refills | Status: DC

## 2020-08-27 MED ORDER — MELOXICAM 7.5 MG PO TABS: 7.5000 mg | ORAL_TABLET | Freq: Every day | ORAL | 0 refills | Status: DC

## 2020-08-27 NOTE — Progress Notes (Signed)
Patient ID: Courtney Hale, female    DOB: Oct 23, 1966, 54 y.o.   MRN: [id number]   Chief Complaint  Patient presents with  . Depression and anxiety    Patient reports she hasn't been sleeping well last month or so, has tried otc benadryl but wakes up constantly. Needs refills on all meds.   . Hypertension   Subjective:    HPI F/u depression/anxiet and htn-  Pt stating she had a cold a few weeks ago and used otc meds. Feeling tired and ran down.  Went to ER 07/22/20.  Went to ER for htn. Needing refill of medications. Had ran out of klonapin 1 mo ago.  Pt was on paxil for depression and anxiety. Was on celexa in past.  Staying down still.  Still having crying spells. And taking clonazepam 1mg  taking it prn. Last script 2020. occ take one when having lots of stress and cry spells. Not much sleep and was given ambien, only getting 4-5 hrs of sleep. Was taking benadryl 50mg . Laying down and 9-9;30pm.  Putting son to be around 9pm. Not watching tv or phone at night.  getting up 4:30-5am- working at [employer]. Has h/o migraines occ  Dx with DM2 in 2020- not on meds. Doing diet modification at that time.  Seeing dr in [place] for diet meds. Was on phentermine in past 3 months. A few times since 2014- 2017.    Medical History Courtney Hale has a past medical history of Complication of anesthesia, Depression, Hemorrhoids, History of esophageal stricture, History of gastritis, History of melanoma excision, Hypertension, Microhematuria, Migraines, Nephrolithiasis, Right ureteral stone, Urgency of urination, and Wears contact lenses.   Outpatient Encounter Medications as of 08/27/2020  Medication Sig  . acetaminophen (TYLENOL) 500 MG tablet Take 500 mg by mouth every 6 (six) hours as needed. As needed  . benzonatate (TESSALON) 100 MG capsule Take 1 capsule (100 mg total) by mouth 3 (three) times daily as needed for cough.  . clonazePAM (KLONOPIN) 1 MG tablet Take 0.5-1 tablets  (0.5-1 mg total) by mouth daily as needed for anxiety.  . hydrochlorothiazide (HYDRODIURIL) 25 MG tablet Take 1 tablet (25 mg total) by mouth daily.  Marland Kitchen losartan (COZAAR) 100 MG tablet Take 1 tablet (100 mg total) by mouth daily.  . meclizine (ANTIVERT) 25 MG tablet Take 25 mg by mouth 3 (three) times daily as needed for dizziness.  . meloxicam (MOBIC) 7.5 MG tablet Take 1 tablet (7.5 mg total) by mouth daily. Prn migraine  . omeprazole (PRILOSEC) 20 MG capsule 1 PO 30 mins prior to breakfast and supper  . ondansetron (ZOFRAN ODT) 4 MG disintegrating tablet Take 1 tablet (4 mg total) by mouth every 8 (eight) hours as needed for nausea or vomiting.  Marland Kitchen OVER THE COUNTER MEDICATION [employer] Women's probiotic   One daily  . OVER THE COUNTER MEDICATION Vit d 3 2,000 daily b12 2,000 daily Cinnamon 500 daily Green tea extract bid chrominum once daily  . PARoxetine (PAXIL) 40 MG tablet TAKE 1 TABLET BY MOUTH ONCE DAILY IN THE MORNING  . rizatriptan (MAXALT-MLT) 10 MG disintegrating tablet Take 1 tablet (10 mg total) by mouth as needed for migraine. May repeat in 2 hours if needed. Max 2 per 24 hours  . traZODone (DESYREL) 50 MG tablet 1-2 tab p.o. qhs prn insomnia.  Marland Kitchen vitamin B-12 (CYANOCOBALAMIN) 1000 MCG tablet Take 1,000 mcg by mouth daily.  Marland Kitchen zolpidem (AMBIEN) 10 MG tablet Take 1 tablet (10 mg total) by mouth  at bedtime as needed. for sleep  . [DISCONTINUED] amoxicillin (AMOXIL) 500 MG capsule Take 1 capsule (500 mg total) by mouth 3 (three) times daily.  . [DISCONTINUED] clonazePAM (KLONOPIN) 1 MG tablet Take 0.5-1 tablets (0.5-1 mg total) by mouth daily as needed for anxiety.  . [DISCONTINUED] hydrochlorothiazide (HYDRODIURIL) 25 MG tablet Take 1 tablet (25 mg total) by mouth daily.   No facility-administered encounter medications on file as of 08/27/2020.     Review of Systems  Constitutional: Negative for chills and fever.  HENT: Negative for congestion, rhinorrhea and sore throat.    Respiratory: Negative for cough, shortness of breath and wheezing.   Cardiovascular: Negative for chest pain and leg swelling.  Gastrointestinal: Negative for abdominal pain, diarrhea, nausea and vomiting.  Genitourinary: Negative for dysuria and frequency.  Musculoskeletal: Negative for arthralgias and back pain.  Skin: Negative for rash.  Neurological: Negative for dizziness, weakness and headaches.     Vitals BP 124/82   Pulse 97   Temp 97.9 F (36.6 C)   Wt 181 lb 6.4 oz (82.3 kg)   LMP 10/26/2014   SpO2 99%   BMI 31.63 kg/m   Objective:   Physical Exam Vitals and nursing note reviewed.  Constitutional:      General: She is not in acute distress.    Appearance: Normal appearance. She is not ill-appearing.  HENT:     Head: Normocephalic and atraumatic.     Nose: Nose normal.     Mouth/Throat:     Mouth: Mucous membranes are moist.     Pharynx: Oropharynx is clear.  Eyes:     Extraocular Movements: Extraocular movements intact.     Conjunctiva/sclera: Conjunctivae normal.     Pupils: Pupils are equal, round, and reactive to light.  Cardiovascular:     Rate and Rhythm: Normal rate and regular rhythm.     Pulses: Normal pulses.     Heart sounds: Normal heart sounds.  Pulmonary:     Effort: Pulmonary effort is normal.     Breath sounds: Normal breath sounds. No wheezing, rhonchi or rales.  Musculoskeletal:        General: Normal range of motion.     Right lower leg: No edema.     Left lower leg: No edema.  Skin:    General: Skin is warm and dry.     Findings: No lesion or rash.  Neurological:     General: No focal deficit present.     Mental Status: She is alert and oriented to person, place, and time.  Psychiatric:        Mood and Affect: Mood normal.        Behavior: Behavior normal.      Assessment and Plan   1. Type 2 diabetes mellitus without complication, without long-term current use of insulin (HCC) - Hemoglobin X7D - Basic metabolic  panel  2. Thyroid disorder screen - TSH  3. High risk medication use - ToxASSURE Select 13 (MW), Urine  4. Insomnia, unspecified type - traZODone (DESYREL) 50 MG tablet; 1-2 tab p.o. qhs prn insomnia.  Dispense: 60 tablet; Refill: 0 - clonazePAM (KLONOPIN) 1 MG tablet; Take 0.5-1 tablets (0.5-1 mg total) by mouth daily as needed for anxiety.  Dispense: 24 tablet; Refill: 1  5. Primary hypertension - hydrochlorothiazide (HYDRODIURIL) 25 MG tablet; Take 1 tablet (25 mg total) by mouth daily.  Dispense: 90 tablet; Refill: 1  6. Anxiety and depression - clonazePAM (KLONOPIN) 1 MG tablet; Take 0.5-1 tablets (  0.5-1 mg total) by mouth daily as needed for anxiety.  Dispense: 24 tablet; Refill: 1   Anxiety/insomnia- Also discussed concerns of taking long term [place] with concerns of dependency, falls, memory issues, and confusion. Advising we could continue with klonapin for now to help with anxiety/insomnia. Pt to continue with paxil. May need to increase this on next visit.  Weight gain- Discussed with pt not ideal to be on phentermine long term and if not losing at least 4% of body weight then should be discontinued.  For the weight concerns, advising to f/u with labs to see what is going on with her diabetes.  Labs ordered today.  htn- stable. Cont meds. F/u 37mo or prn.

## 2020-08-28 ENCOUNTER — Other Ambulatory Visit: Payer: Self-pay | Admitting: Family Medicine

## 2020-08-28 LAB — HEMOGLOBIN A1C
Est. average glucose Bld gHb Est-mCnc: 212 mg/dL
Hgb A1c MFr Bld: 9 % — ABNORMAL HIGH (ref 4.8–5.6)

## 2020-08-28 LAB — BASIC METABOLIC PANEL
BUN/Creatinine Ratio: 18 (ref 9–23)
BUN: 11 mg/dL (ref 6–24)
CO2: 27 mmol/L (ref 20–29)
Calcium: 9.8 mg/dL (ref 8.7–10.2)
Chloride: 97 mmol/L (ref 96–106)
Creatinine, Ser: 0.61 mg/dL (ref 0.57–1.00)
GFR calc Af Amer: 121 mL/min/{1.73_m2} (ref >59)
GFR calc non Af Amer: 105 mL/min/{1.73_m2} (ref >59)
Glucose: 169 mg/dL — ABNORMAL HIGH (ref 65–99)
Potassium: 3.8 mmol/L (ref 3.5–5.2)
Sodium: 140 mmol/L (ref 134–144)

## 2020-08-28 LAB — TSH: TSH: 0.996 u[IU]/mL (ref 0.450–4.500)

## 2020-08-28 MED ORDER — METFORMIN HCL 500 MG PO TABS: ORAL_TABLET | ORAL | 2 refills | Status: DC

## 2020-09-01 LAB — TOXASSURE SELECT 13 (MW), URINE

## 2020-09-13 ENCOUNTER — Encounter: Payer: Self-pay | Admitting: Family Medicine

## 2020-09-13 MED ORDER — CLONAZEPAM 1 MG PO TABS: 0.5000 mg | ORAL_TABLET | Freq: Every day | ORAL | 1 refills | Status: DC | PRN

## 2020-09-19 ENCOUNTER — Other Ambulatory Visit: Payer: Self-pay | Admitting: Family Medicine

## 2020-10-08 ENCOUNTER — Other Ambulatory Visit: Payer: Self-pay

## 2020-10-08 ENCOUNTER — Ambulatory Visit
Admission: EM | Admit: 2020-10-08 | Discharge: 2020-10-08 | Disposition: A | Discharge status: Home / Self Care | Payer: BC Managed Care – PPO | Attending: Physician Assistant | Admitting: Physician Assistant

## 2020-10-08 ENCOUNTER — Encounter: Payer: Self-pay | Admitting: Emergency Medicine

## 2020-10-08 DIAGNOSIS — J069 Acute upper respiratory infection, unspecified: Secondary | ICD-10-CM | POA: Diagnosis not present

## 2020-10-08 MED ORDER — ALBUTEROL SULFATE HFA 108 (90 BASE) MCG/ACT IN AERS
2.0000 | INHALATION_SPRAY | Freq: Once | RESPIRATORY_TRACT | Status: AC
  Administered 2020-10-08: 16:00:00 2 via RESPIRATORY_TRACT

## 2020-10-08 NOTE — Discharge Instructions (Addendum)
Your influenza and covid test are pending.  Use inhaler 2 puffs every 4 hours.

## 2020-10-08 NOTE — ED Provider Notes (Signed)
[facility]    CSN: [id number] Arrival date & time: 10/08/20  1504      History   Chief Complaint No chief complaint on file.   HPI Courtney Hale is a 54 y.o. female.   The history is provided by the patient. No language interpreter was used.  Cough Cough characteristics:  Non-productive Sputum characteristics:  Nondescript Severity:  Moderate Onset quality:  Gradual Duration:  4 days Timing:  Constant Progression:  Worsening Chronicity:  New Smoker: no   Context: upper respiratory infection   Relieved by:  Nothing Worsened by:  Nothing Ineffective treatments:  None tried  Pt has been coughing for 4 days.  Pt had covid in March.  Pt has had covid shots  Past Medical History:  Diagnosis Date  . Complication of anesthesia    woke up during endoscopy and colonoscopy  . Depression   . Hemorrhoids   . History of esophageal stricture    2011  peptic w/  dilatation  . History of gastritis    2011  . History of melanoma excision    2015  left leg/   12-09-2015 back   . Hypertension   . Microhematuria   . Migraines   . Nephrolithiasis    left side non-obstructive   . Right ureteral stone   . Urgency of urination   . Wears contact lenses     Patient Active Problem List   Diagnosis Date Noted  . Type 2 diabetes mellitus ([place]) 09/30/2019  . Rectal bleeding 04/13/2018  . GERD (gastroesophageal reflux disease) 07/27/2017  . Anxiety and depression 12/18/2016  . Venous stasis dermatitis of both lower extremities 06/09/2016  . Diastasis of rectus abdominis 03/18/2016  . IBS (irritable bowel syndrome) 03/18/2016  . Melanoma of skin ([place]) 03/18/2016  . Headache, chronic migraine without aura 01/31/2013  . Hypertension 01/31/2013    Past Surgical History:  Procedure Laterality Date  . ABDOMINAL HYSTERECTOMY    . CARPAL TUNNEL RELEASE Right 01-05- 2015  . COLONOSCOPY N/A 08/27/2015   Procedure: COLONOSCOPY;  Surgeon: Danie Binder, MD;  [facility]:  [facility];  Service: Endoscopy;  Laterality: N/A;  0830  . CYST EXCISION  2012     right hand  . CYSTOSCOPY W/ URETERAL STENT PLACEMENT Right 12/22/2015   Procedure: CYSTOSCOPY WITH RETROGRADE PYELOGRAM/URETERAL STENT PLACEMENT;  Surgeon: Nickie Retort, MD;  Location: [facility];  Service: Urology;  Laterality: Right;  . CYSTOSCOPY WITH RETROGRADE PYELOGRAM, URETEROSCOPY AND STENT PLACEMENT Right 01/05/2016   Procedure: CYSTOSCOPY WITH RETROGRADE PYELOGRAM, URETEROSCOPY AND STENT REPLACEMENT;  Surgeon: Nickie Retort, MD;  Location: [facility];  Service: Urology;  Laterality: Right;  . DILATION AND CURETTAGE OF UTERUS  03-12-2009   w/  Suction  . double balloon enteroscopy     Dr. Arsenio Loader at [facility]: no erosions, no evidence of Crohn's disease, path without Crohn's.   Marland Kitchen EGD with push enteroscopy  02-12-2010    patent distal peptic stricture with diffuse antral erythema, normal D1 and D2   . LAPAROSCOPIC ASSISTED VAGINAL HYSTERECTOMY  10-26-2014   w/  Bilateral Salpingoophorectomy  . STONE EXTRACTION WITH BASKET Right 01/05/2016   Procedure: STONE EXTRACTION WITH BASKET;  Surgeon: Nickie Retort, MD;  Location: [facility];  Service: Urology;  Laterality: Right;  . TONSILLECTOMY  1998  approx    OB History   No obstetric history on file.      Home Medications    Prior to  Admission medications   Medication Sig Start Date End Date Taking? Authorizing Provider  acetaminophen (TYLENOL) 500 MG tablet Take 500 mg by mouth every 6 (six) hours as needed. As needed    [provider]  benzonatate (TESSALON) 100 MG capsule Take 1 capsule (100 mg total) by mouth 3 (three) times daily as needed for cough. 01/02/20   [name], MD  clonazePAM (KLONOPIN) 1 MG tablet Take 0.5-1 tablets (0.5-1 mg total) by mouth daily as needed for anxiety. 09/13/20   Lovena Le, Malena M, DO  hydrochlorothiazide (HYDRODIURIL) 25 MG tablet Take 1 tablet (25 mg  total) by mouth daily. 08/27/20   Erven Colla, DO  losartan (COZAAR) 100 MG tablet Take 1 tablet by mouth once daily 09/22/20   Elvia Collum M, DO  meclizine (ANTIVERT) 25 MG tablet Take 25 mg by mouth 3 (three) times daily as needed for dizziness.    [provider]  meloxicam (MOBIC) 7.5 MG tablet Take 1 tablet (7.5 mg total) by mouth daily. Prn migraine 08/27/20   Elvia Collum M, DO  metFORMIN (GLUCOPHAGE) 500 MG tablet Take one tablet po BID with meals 08/28/20   Lovena Le, Malena M, DO  omeprazole (PRILOSEC) 20 MG capsule 1 PO 30 mins prior to breakfast and supper 12/05/19   Danie Binder, MD  ondansetron (ZOFRAN ODT) 4 MG disintegrating tablet Take 1 tablet (4 mg total) by mouth every 8 (eight) hours as needed for nausea or vomiting. 01/02/20   [name], MD  OVER THE COUNTER MEDICATION Renew Women's probiotic   One daily    [provider]  OVER THE COUNTER MEDICATION Vit d 3 2,000 daily b12 2,000 daily Cinnamon 500 daily Green tea extract bid chrominum once daily    [provider]  PARoxetine (PAXIL) 40 MG tablet TAKE 1 TABLET BY MOUTH ONCE DAILY IN THE MORNING 07/24/20   Elvia Collum M, DO  rizatriptan (MAXALT-MLT) 10 MG disintegrating tablet Take 1 tablet (10 mg total) by mouth as needed for migraine. May repeat in 2 hours if needed. Max 2 per 24 hours 05/22/20   Elvia Collum M, DO  traZODone (DESYREL) 50 MG tablet 1-2 tab p.o. qhs prn insomnia. 08/27/20   Elvia Collum M, DO  vitamin B-12 (CYANOCOBALAMIN) 1000 MCG tablet Take 1,000 mcg by mouth daily.    [provider]    Family History Family History  Problem Relation Age of Onset  . Diabetes Mother   . Colon cancer Neg Hx     Social History Social History   Tobacco Use  . Smoking status: Never Smoker  . Smokeless tobacco: Never Used  Vaping Use  . Vaping Use: Never used  Substance Use Topics  . Alcohol use: Yes    Comment: occassionally  . Drug use: No      Allergies   Topamax [topiramate] and Sulfa antibiotics   Review of Systems Review of Systems  Respiratory: Positive for cough.   All other systems reviewed and are negative.    Physical Exam Triage Vital Signs ED Triage Vitals  Enc Vitals Group     BP 10/08/20 1525 123/83     Pulse Rate 10/08/20 1525 94     Resp 10/08/20 1525 16     Temp 10/08/20 1525 98.1 F (36.7 C)     Temp Source 10/08/20 1525 Oral     SpO2 10/08/20 1525 95 %     Weight --      Height --  Head Circumference --      Peak Flow --      Pain Score 10/08/20 1527 0     Pain Loc --      Pain Edu? --      Excl. in [place]? --    No data found.  Updated Vital Signs BP 123/83 (BP Location: Right Arm)   Pulse 94   Temp 98.1 F (36.7 C) (Oral)   Resp 16   LMP 10/26/2014   SpO2 95%   Visual Acuity Right Eye Distance:   Left Eye Distance:   Bilateral Distance:    Right Eye Near:   Left Eye Near:    Bilateral Near:     Physical Exam Vitals and nursing note reviewed.  Constitutional:      Appearance: She is well-developed and well-nourished.  HENT:     Head: Normocephalic.     Mouth/Throat:     Mouth: Mucous membranes are moist.  Eyes:     Extraocular Movements: EOM normal.  Cardiovascular:     Rate and Rhythm: Normal rate.  Pulmonary:     Effort: Pulmonary effort is normal.     Breath sounds: Rhonchi present.  Abdominal:     General: There is no distension.  Musculoskeletal:        General: Normal range of motion.     Cervical back: Normal range of motion.  Skin:    General: Skin is warm.  Neurological:     Mental Status: She is alert and oriented to person, place, and time.  Psychiatric:        Mood and Affect: Mood and affect and mood normal.      UC Treatments / Results  Labs (all labs ordered are listed, but only abnormal results are displayed) Labs Reviewed  COVID-19, FLU A+B NAA    EKG   Radiology No results found.  Procedures Procedures (including  critical care time)  Medications Ordered in UC Medications  albuterol (VENTOLIN HFA) 108 (90 Base) MCG/ACT inhaler 2 puff (has no administration in time range)    Initial Impression / Assessment and Plan / UC Course  I have reviewed the triage vital signs and the nursing notes.  Pertinent labs & imaging results that were available during my care of the patient were reviewed by me and considered in my medical decision making (see chart for details).     MDM:  Pt given albuterol 2 puffs here.  Pt advised to continue albuterol 2 puffs every 4 hours.  Final Clinical Impressions(s) / UC Diagnoses   Final diagnoses:  Viral URI     Discharge Instructions     Your influenza and covid test are pending.  Use inhaler 2 puffs every 4 hours.    ED Prescriptions    None     PDMP not reviewed this encounter.  An After Visit Summary was printed and given to the patient.    Fransico Meadow, [place] 10/08/20 1558

## 2020-10-08 NOTE — ED Triage Notes (Signed)
Cough and congestion since Saturday

## 2020-10-10 LAB — COVID-19, FLU A+B NAA
Influenza A, NAA: NOT DETECTED
Influenza B, NAA: NOT DETECTED
SARS-CoV-2, NAA: NOT DETECTED

## 2020-10-13 ENCOUNTER — Telehealth: Payer: Self-pay | Admitting: Urgent Care

## 2020-10-13 MED ORDER — BENZONATATE 100 MG PO CAPS
100.0000 mg | ORAL_CAPSULE | Freq: Three times a day (TID) | ORAL | 0 refills | Status: DC | PRN
Start: 2020-10-13 — End: 2021-01-30

## 2020-10-13 MED ORDER — PSEUDOEPHEDRINE HCL 60 MG PO TABS: 60.0000 mg | ORAL_TABLET | Freq: Three times a day (TID) | ORAL | 0 refills | Status: DC | PRN

## 2020-10-13 MED ORDER — PROMETHAZINE-DM 6.25-15 MG/5ML PO SYRP: 5.0000 mL | ORAL_SOLUTION | Freq: Every evening | ORAL | 0 refills | Status: DC | PRN

## 2020-10-13 MED ORDER — CETIRIZINE HCL 10 MG PO TABS: 10.0000 mg | ORAL_TABLET | Freq: Every day | ORAL | 0 refills | Status: DC

## 2020-10-13 NOTE — Telephone Encounter (Signed)
Patient called and requested additional medications for supportive care including congestion and cough.  She was last seen on 10/08/2020 and had negative Covid testing.  We will send the prescription electronically for cough suppression medications, Zyrtec and pseudoephedrine.  Wt Readings from Last 3 Encounters:  08/27/20 181 lb 6.4 oz (82.3 kg)  07/21/20 183 lb (83 kg)  12/05/19 179 lb (81.2 kg)   Temp Readings from Last 3 Encounters:  10/08/20 98.1 F (36.7 C) (Oral)  08/27/20 97.9 F (36.6 C)  07/21/20 (!) 97.5 F (36.4 C) (Oral)   BP Readings from Last 3 Encounters:  10/08/20 123/83  08/27/20 124/82  07/22/20 119/86   Pulse Readings from Last 3 Encounters:  10/08/20 94  08/27/20 97  07/22/20 78

## 2020-10-24 ENCOUNTER — Telehealth: Payer: Self-pay | Admitting: Family Medicine

## 2020-10-24 DIAGNOSIS — G4700 Insomnia, unspecified: Secondary | ICD-10-CM

## 2020-10-28 NOTE — Telephone Encounter (Signed)
Pt needs appt to f/u depression/anxiety/insomnia in feb.  1 mo gave of trazodone.  Thx. Dr. Ladona Ridgel

## 2020-10-28 NOTE — Telephone Encounter (Signed)
Please contact patient to have her set up appt in Feb. Thank you!

## 2020-10-29 ENCOUNTER — Other Ambulatory Visit: Payer: Self-pay | Admitting: Family Medicine

## 2020-10-29 ENCOUNTER — Telehealth: Payer: Self-pay

## 2020-10-29 DIAGNOSIS — G4700 Insomnia, unspecified: Secondary | ICD-10-CM

## 2020-10-29 NOTE — Telephone Encounter (Signed)
Sent my chart to schedule appointment  

## 2020-10-29 NOTE — Telephone Encounter (Signed)
Pt was put on metFORMIN (GLUCOPHAGE) 500 MG tablet  And sugar is running 259 she has been sick and lowest she has got it waking up is 152, blurred vision, headache, can't focus   Pt  [phone]

## 2020-10-30 ENCOUNTER — Encounter: Payer: Self-pay | Admitting: Family Medicine

## 2020-10-30 ENCOUNTER — Other Ambulatory Visit: Payer: Self-pay

## 2020-10-30 ENCOUNTER — Ambulatory Visit (INDEPENDENT_AMBULATORY_CARE_PROVIDER_SITE_OTHER): Payer: BC Managed Care – PPO | Admitting: Family Medicine

## 2020-10-30 VITALS — BP 124/82 | HR 87 | Temp 97.7°F | Ht 63.5 in | Wt 182.2 lb

## 2020-10-30 DIAGNOSIS — Z23 Encounter for immunization: Secondary | ICD-10-CM | POA: Diagnosis not present

## 2020-10-30 DIAGNOSIS — E669 Obesity, unspecified: Secondary | ICD-10-CM | POA: Diagnosis not present

## 2020-10-30 DIAGNOSIS — Z8616 Personal history of COVID-19: Secondary | ICD-10-CM | POA: Diagnosis not present

## 2020-10-30 DIAGNOSIS — F32A Depression, unspecified: Secondary | ICD-10-CM

## 2020-10-30 DIAGNOSIS — E1165 Type 2 diabetes mellitus with hyperglycemia: Secondary | ICD-10-CM

## 2020-10-30 DIAGNOSIS — R739 Hyperglycemia, unspecified: Secondary | ICD-10-CM

## 2020-10-30 DIAGNOSIS — G4700 Insomnia, unspecified: Secondary | ICD-10-CM

## 2020-10-30 DIAGNOSIS — F419 Anxiety disorder, unspecified: Secondary | ICD-10-CM

## 2020-10-30 DIAGNOSIS — E119 Type 2 diabetes mellitus without complications: Secondary | ICD-10-CM

## 2020-10-30 LAB — POCT GLUCOSE (DEVICE FOR HOME USE): POC Glucose: 195 mg/dl — AB (ref 70–99)

## 2020-10-30 MED ORDER — METFORMIN HCL 850 MG PO TABS: 850.0000 mg | ORAL_TABLET | Freq: Two times a day (BID) | ORAL | 2 refills | Status: DC

## 2020-10-30 MED ORDER — PAROXETINE HCL 40 MG PO TABS: ORAL_TABLET | ORAL | 0 refills | Status: DC

## 2020-10-30 MED ORDER — TRAZODONE HCL 50 MG PO TABS: ORAL_TABLET | ORAL | 2 refills | Status: DC

## 2020-10-30 MED ORDER — PHENTERMINE HCL 37.5 MG PO TABS
37.5000 mg | ORAL_TABLET | Freq: Every day | ORAL | 2 refills | Status: DC
Start: 2020-10-30 — End: 2021-01-30

## 2020-10-30 NOTE — Telephone Encounter (Signed)
Patient notified of appointment -is coming today 1:30.

## 2020-10-30 NOTE — Progress Notes (Signed)
Patient ID: Courtney Hale, female    DOB: 1965-11-21, 55 y.o.   MRN: [id number]   Chief Complaint  Patient presents with  . Hyperglycemia   Subjective:    HPI  Cc- elevated BG, f/u new onset diabetes.  Pt went to urgent care 10/08/20. Had upper resp illness.  covid and flu testing -negative. Cough syrup and tessalon perles. Had inhaler.  Gave allergy medications. Feeling better with breathing.  Feeling dizziness and hard to concentrate and feeling blurry vision at times.  Seeing elevated glucose- checking now. Seeing numbers 143- 210, 249 in am.  On fluid pill, not taking daily. Not eating much or drinking much. Had diet coke sipped on. Not been bothering her on metformin. No GI symptoms. No diarrhea.  Had headaches on top of head using maxalt and meloxicam for migraines.  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  Now meloxicam and not taking the clonazepam now.  If pt didn't get CDL then she should lose her job. Pt is supposed to retest for her UDS. If she didn't pass from her urine drug screen. Failed due to positive uds 1 yr ago. Then state failed her for CDL due to medications.  Had eyes checked and pt thinking needing to get correct contacts.   Medical History Courtney Hale has a past medical history of Complication of anesthesia, Depression, Hemorrhoids, History of esophageal stricture, History of gastritis, History of melanoma excision, Hypertension, Microhematuria, Migraines, Nephrolithiasis, Right ureteral stone, Urgency of urination, and Wears contact lenses.   Outpatient Encounter Medications as of 10/30/2020  Medication Sig  . [DISCONTINUED] metFORMIN (GLUCOPHAGE) 850 MG tablet Take 1 tablet (850 mg total) by mouth 2 (two) times daily with a meal.  . acetaminophen (TYLENOL) 500 MG tablet Take 500 mg by mouth every 6 (six) hours as needed. As needed  . benzonatate (TESSALON) 100 MG  capsule Take 1 capsule (100 mg total) by mouth 3 (three) times daily as needed for cough. (Patient not taking: Reported on 10/30/2020)  . benzonatate (TESSALON) 100 MG capsule Take 1-2 capsules (100-200 mg total) by mouth 3 (three) times daily as needed. (Patient not taking: Reported on 10/30/2020)  . cetirizine (ZYRTEC ALLERGY) 10 MG tablet Take 1 tablet (10 mg total) by mouth daily.  . clonazePAM (KLONOPIN) 1 MG tablet Take 0.5-1 tablets (0.5-1 mg total) by mouth daily as needed for anxiety.  . hydrochlorothiazide (HYDRODIURIL) 25 MG tablet Take 1 tablet (25 mg total) by mouth daily.  Marland Kitchen losartan (COZAAR) 100 MG tablet Take 1 tablet by mouth once daily  . meclizine (ANTIVERT) 25 MG tablet Take 25 mg by mouth 3 (three) times daily as needed for dizziness.  . meloxicam (MOBIC) 7.5 MG tablet Take 1 tablet (7.5 mg total) by mouth daily. Prn migraine  . metFORMIN (GLUCOPHAGE) 850 MG tablet Take 1 tablet (850 mg total) by mouth 2 (two) times daily with a meal.  . omeprazole (PRILOSEC) 20 MG capsule 1 PO 30 mins prior to breakfast and supper  . ondansetron (ZOFRAN ODT) 4 MG disintegrating tablet Take 1 tablet (4 mg total) by mouth every 8 (eight) hours as needed for nausea or vomiting.  Marland Kitchen OVER THE COUNTER MEDICATION Renew Women's probiotic   One daily  . OVER THE COUNTER MEDICATION Vit d 3 2,000 daily b12 2,000 daily Cinnamon 500 daily Green tea extract bid chrominum once daily  . PARoxetine (  PAXIL) 40 MG tablet TAKE 1 TABLET BY MOUTH ONCE DAILY IN THE MORNING  . phentermine (ADIPEX-P) 37.5 MG tablet Take 1 tablet (37.5 mg total) by mouth daily before breakfast.  . promethazine-dextromethorphan (PROMETHAZINE-DM) 6.25-15 MG/5ML syrup Take 5 mLs by mouth at bedtime as needed for cough.  . pseudoephedrine (SUDAFED) 60 MG tablet Take 1 tablet (60 mg total) by mouth every 8 (eight) hours as needed for congestion.  . rizatriptan (MAXALT-MLT) 10 MG disintegrating tablet Take 1 tablet (10 mg total) by mouth as  needed for migraine. May repeat in 2 hours if needed. Max 2 per 24 hours  . traZODone (DESYREL) 50 MG tablet TAKE 1 TO 2 TABLETS BY MOUTH EVERY DAY AT BEDTIME AS NEEDED FOR INSOMNIA  . vitamin B-12 (CYANOCOBALAMIN) 1000 MCG tablet Take 1,000 mcg by mouth daily.  . [DISCONTINUED] metFORMIN (GLUCOPHAGE) 500 MG tablet Take one tablet po BID with meals  . [DISCONTINUED] PARoxetine (PAXIL) 40 MG tablet TAKE 1 TABLET BY MOUTH ONCE DAILY IN THE MORNING  . [DISCONTINUED] traZODone (DESYREL) 50 MG tablet TAKE 1 TO 2 TABLETS BY MOUTH EVERY DAY AT BEDTIME AS NEEDED FOR INSOMNIA   No facility-administered encounter medications on file as of 10/30/2020.     Review of Systems  Constitutional: Negative for chills and fever.  HENT: Negative for congestion, rhinorrhea and sore throat.   Eyes: Positive for visual disturbance (blurry vision).  Respiratory: Negative for cough, shortness of breath and wheezing.   Cardiovascular: Negative for chest pain and leg swelling.  Gastrointestinal: Negative for abdominal pain, diarrhea, nausea and vomiting.  Endocrine: Negative for polydipsia, polyphagia and polyuria.  Genitourinary: Negative for dysuria and frequency.  Musculoskeletal: Negative for arthralgias and back pain.  Skin: Negative for rash.  Neurological: Positive for dizziness. Negative for weakness and headaches.     Vitals BP 124/82   Pulse 87   Temp 97.7 F (36.5 C) (Oral)   Ht 5' 3.5" (1.613 m)   Wt 182 lb 3.2 oz (82.6 kg)   LMP 10/26/2014   SpO2 96%   BMI 31.77 kg/m   Objective:   Physical Exam Vitals and nursing note reviewed.  Constitutional:      Appearance: Normal appearance.  HENT:     Head: Normocephalic and atraumatic.     Nose: Nose normal.     Mouth/Throat:     Mouth: Mucous membranes are moist.     Pharynx: Oropharynx is clear.  Eyes:     Extraocular Movements: Extraocular movements intact.     Conjunctiva/sclera: Conjunctivae normal.     Pupils: Pupils are equal,  round, and reactive to light.  Cardiovascular:     Rate and Rhythm: Normal rate and regular rhythm.     Pulses: Normal pulses.     Heart sounds: Normal heart sounds.  Pulmonary:     Effort: Pulmonary effort is normal.     Breath sounds: Normal breath sounds. No wheezing, rhonchi or rales.  Musculoskeletal:        General: Normal range of motion.     Right lower leg: No edema.     Left lower leg: No edema.  Skin:    General: Skin is warm and dry.     Findings: No lesion or rash.  Neurological:     General: No focal deficit present.     Mental Status: She is alert and oriented to person, place, and time.  Psychiatric:        Mood and Affect: Mood normal.  Behavior: Behavior normal.      Assessment and Plan   1. Hyperglycemia - POCT Glucose (Device for Home Use) - metFORMIN (GLUCOPHAGE) 850 MG tablet; Take 1 tablet (850 mg total) by mouth 2 (two) times daily with a meal.  Dispense: 60 tablet; Refill: 2  2. Obesity (BMI 30.0-34.9) - phentermine (ADIPEX-P) 37.5 MG tablet; Take 1 tablet (37.5 mg total) by mouth daily before breakfast.  Dispense: 30 tablet; Refill: 2  3. Flu vaccine need - Flu Vaccine QUAD 36+ mos IM  4. History of COVID-19  5. Type 2 diabetes mellitus with hyperglycemia, without long-term current use of insulin (HCC) - metFORMIN (GLUCOPHAGE) 850 MG tablet; Take 1 tablet (850 mg total) by mouth 2 (two) times daily with a meal.  Dispense: 60 tablet; Refill: 2  6. Anxiety and depression - PARoxetine (PAXIL) 40 MG tablet; TAKE 1 TABLET BY MOUTH ONCE DAILY IN THE MORNING  Dispense: 90 tablet; Refill: 0  7. Insomnia, unspecified type - traZODone (DESYREL) 50 MG tablet; TAKE 1 TO 2 TABLETS BY MOUTH EVERY DAY AT BEDTIME AS NEEDED FOR INSOMNIA  Dispense: 60 tablet; Refill: 2   Weight gain/obesity- gave pt small course of phentermine.  Advising this is a jump start and neeidng to make lifestyle changes for long term success.  Pt voiced understanding.  Has  tolerated in past and no side effects.  DM2- with hyperglycemia- uncontrolled.  -increase metformin 500mg  bid to 850mg  bid.  Cancel the metformin at [place]. Wanting meds go to [facility].  Anxiety and depression- stable. Cont paxil.  Pt not currently taking the klonapin. Pt requesting refill on trazodone, it is helping with sleep.  Insomnia- will cont with trazodone.  F/u 26mo or prn.

## 2020-10-30 NOTE — Telephone Encounter (Signed)
08/27/20 was last visit and has visit today

## 2020-11-27 ENCOUNTER — Ambulatory Visit: Payer: BC Managed Care – PPO | Admitting: Family Medicine

## 2020-12-12 ENCOUNTER — Other Ambulatory Visit: Payer: Self-pay | Admitting: Family Medicine

## 2020-12-23 ENCOUNTER — Other Ambulatory Visit: Payer: Self-pay | Admitting: Family Medicine

## 2020-12-29 ENCOUNTER — Other Ambulatory Visit: Payer: Self-pay | Admitting: Family Medicine

## 2020-12-29 DIAGNOSIS — E669 Obesity, unspecified: Secondary | ICD-10-CM

## 2020-12-29 NOTE — Telephone Encounter (Signed)
Med check 10/30/20

## 2021-01-02 ENCOUNTER — Other Ambulatory Visit: Payer: Self-pay | Admitting: Family Medicine

## 2021-01-02 DIAGNOSIS — G4700 Insomnia, unspecified: Secondary | ICD-10-CM

## 2021-01-19 ENCOUNTER — Other Ambulatory Visit: Payer: Self-pay | Admitting: Family Medicine

## 2021-01-19 DIAGNOSIS — F32A Depression, unspecified: Secondary | ICD-10-CM

## 2021-01-19 DIAGNOSIS — E1165 Type 2 diabetes mellitus with hyperglycemia: Secondary | ICD-10-CM

## 2021-01-19 DIAGNOSIS — R739 Hyperglycemia, unspecified: Secondary | ICD-10-CM

## 2021-01-19 DIAGNOSIS — Z79899 Other long term (current) drug therapy: Secondary | ICD-10-CM

## 2021-01-19 NOTE — Telephone Encounter (Signed)
patient is requesting refill on paroxetine 40 mg and losartan 100 mg to sent to [facility] because its cheaper than [facility]

## 2021-01-19 NOTE — Telephone Encounter (Signed)
Last seen 10/30/20. Please advise. Thank you

## 2021-01-19 NOTE — Telephone Encounter (Signed)
Tried to call and no answer.  

## 2021-01-19 NOTE — Telephone Encounter (Signed)
Have pt call and transfer script to [place]. Thanks, dr. Lovena Le

## 2021-01-22 MED ORDER — PAROXETINE HCL 40 MG PO TABS: ORAL_TABLET | ORAL | 0 refills | Status: DC

## 2021-01-22 MED ORDER — LOSARTAN POTASSIUM 100 MG PO TABS: 100.0000 mg | ORAL_TABLET | Freq: Every day | ORAL | 0 refills | Status: DC

## 2021-01-22 NOTE — Telephone Encounter (Signed)
Needs labs before getting 90 days scripts for the meds. Dr. Lovena Le Pls make sure she has labs in computer before next visit. Dr .Darene Lamer

## 2021-01-22 NOTE — Telephone Encounter (Signed)
Patient states she has no refills and would like the scripts changed to 90 day supply at [facility]

## 2021-01-23 NOTE — Addendum Note (Signed)
Addended by: Dairl Ponder on: 01/23/2021 11:09 AM   Modules accepted: Orders

## 2021-01-23 NOTE — Telephone Encounter (Signed)
Left message to return call 

## 2021-01-26 NOTE — Telephone Encounter (Signed)
Patient notified

## 2021-01-27 LAB — COMPREHENSIVE METABOLIC PANEL
ALT: 28 IU/L (ref 0–32)
AST: 21 IU/L (ref 0–40)
Albumin/Globulin Ratio: 1.8 (ref 1.2–2.2)
Albumin: 4.6 g/dL (ref 3.8–4.9)
Alkaline Phosphatase: 97 IU/L (ref 44–121)
BUN/Creatinine Ratio: 20 (ref 9–23)
BUN: 11 mg/dL (ref 6–24)
Bilirubin Total: 0.3 mg/dL (ref 0.0–1.2)
CO2: 24 mmol/L (ref 20–29)
Calcium: 9.5 mg/dL (ref 8.7–10.2)
Chloride: 102 mmol/L (ref 96–106)
Creatinine, Ser: 0.56 mg/dL — ABNORMAL LOW (ref 0.57–1.00)
Globulin, Total: 2.5 g/dL (ref 1.5–4.5)
Glucose: 105 mg/dL — ABNORMAL HIGH (ref 65–99)
Potassium: 4 mmol/L (ref 3.5–5.2)
Sodium: 143 mmol/L (ref 134–144)
Total Protein: 7.1 g/dL (ref 6.0–8.5)
eGFR: 110 mL/min/{1.73_m2} (ref >59)

## 2021-01-27 LAB — CBC WITH DIFFERENTIAL/PLATELET
Basophils Absolute: 0 10*3/uL (ref 0.0–0.2)
Basos: 0 %
EOS (ABSOLUTE): 0.1 10*3/uL (ref 0.0–0.4)
Eos: 1 %
Hematocrit: 41.7 % (ref 34.0–46.6)
Hemoglobin: 14.2 g/dL (ref 11.1–15.9)
Immature Grans (Abs): 0 10*3/uL (ref 0.0–0.1)
Immature Granulocytes: 0 %
Lymphocytes Absolute: 3.7 10*3/uL — ABNORMAL HIGH (ref 0.7–3.1)
Lymphs: 42 %
MCH: 29.8 pg (ref 26.6–33.0)
MCHC: 34.1 g/dL (ref 31.5–35.7)
MCV: 87 fL (ref 79–97)
Monocytes Absolute: 0.5 10*3/uL (ref 0.1–0.9)
Monocytes: 6 %
Neutrophils Absolute: 4.4 10*3/uL (ref 1.4–7.0)
Neutrophils: 51 %
Platelets: 320 10*3/uL (ref 150–450)
RBC: 4.77 x10E6/uL (ref 3.77–5.28)
RDW: 12.8 % (ref 11.7–15.4)
WBC: 8.7 10*3/uL (ref 3.4–10.8)

## 2021-01-27 LAB — MICROALBUMIN / CREATININE URINE RATIO
Creatinine, Urine: 140.2 mg/dL
Microalb/Creat Ratio: 10 mg/g creat (ref 0–29)
Microalbumin, Urine: 13.8 ug/mL

## 2021-01-27 LAB — HEMOGLOBIN A1C
Est. average glucose Bld gHb Est-mCnc: 143 mg/dL
Hgb A1c MFr Bld: 6.6 % — ABNORMAL HIGH (ref 4.8–5.6)

## 2021-01-28 ENCOUNTER — Ambulatory Visit: Payer: Self-pay | Admitting: Family Medicine

## 2021-01-29 ENCOUNTER — Encounter: Payer: Self-pay | Admitting: Family Medicine

## 2021-01-30 ENCOUNTER — Other Ambulatory Visit: Payer: Self-pay

## 2021-01-30 ENCOUNTER — Ambulatory Visit (INDEPENDENT_AMBULATORY_CARE_PROVIDER_SITE_OTHER): Payer: BC Managed Care – PPO | Admitting: Family Medicine

## 2021-01-30 ENCOUNTER — Encounter: Payer: Self-pay | Admitting: Family Medicine

## 2021-01-30 DIAGNOSIS — R739 Hyperglycemia, unspecified: Secondary | ICD-10-CM

## 2021-01-30 DIAGNOSIS — I10 Essential (primary) hypertension: Secondary | ICD-10-CM | POA: Diagnosis not present

## 2021-01-30 DIAGNOSIS — G4700 Insomnia, unspecified: Secondary | ICD-10-CM

## 2021-01-30 DIAGNOSIS — E1165 Type 2 diabetes mellitus with hyperglycemia: Secondary | ICD-10-CM

## 2021-01-30 MED ORDER — TRAZODONE HCL 50 MG PO TABS: ORAL_TABLET | ORAL | 5 refills | Status: DC

## 2021-01-30 MED ORDER — LOSARTAN POTASSIUM 100 MG PO TABS: 100.0000 mg | ORAL_TABLET | Freq: Every day | ORAL | 1 refills | Status: DC

## 2021-01-30 MED ORDER — BUSPIRONE HCL 5 MG PO TABS: 5.0000 mg | ORAL_TABLET | Freq: Three times a day (TID) | ORAL | 0 refills | Status: DC

## 2021-01-30 MED ORDER — METFORMIN HCL 850 MG PO TABS: 850.0000 mg | ORAL_TABLET | Freq: Two times a day (BID) | ORAL | 1 refills | Status: DC

## 2021-01-30 MED ORDER — HYDROCHLOROTHIAZIDE 25 MG PO TABS: 25.0000 mg | ORAL_TABLET | Freq: Every day | ORAL | 1 refills | Status: DC

## 2021-01-30 NOTE — Progress Notes (Signed)
Patient ID: Courtney Hale, female    DOB: [birth year], 55 y.o.   MRN: [id number]   Chief Complaint  Patient presents with  . Hyperglycemia    Follow up  And medication follow up - phentermine   Subjective:    HPI F/u DM2 with hyperglycemia-  Pt has been fighting a cold per pt.  Her son was feeling better was sick this week with vomiting. Pt feeling agitated and aggressive.  Since pt was off and was taking klonapin.  Has some tearfulness.  Pt was on celexa in past.  It helped then changed it to paxil.  Has been on something for 20 yrs for depression/anxiety. Then taken clonazepam and was given to her from her last pcp.  Pt needing to take a medication for anxiety and was on Klonapin and not able to not take it to be able to pass the urine drug screen for her work.  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  Pt tried topamax and gave her kidney stones in 2018.  DM2- Now a1c is improved now from 9.9 down to 6.6. a1c.   Some times even if not eating at night, then fasting her glucose can still be elevated.  This morning was 132 bg.   Pt has been taking phentermine for 3 months to help with jump starting her weight loss and food cravings. Not eating 3 meals per day. Some times eating 2 crackers in morning. Not eating in morning a full meal. Pt stating not getting a break during work.  Pt stating supposed to have a 52min break and trying to work, so pt stating not time to eat. In 1/22- weight was 182 lbs and today is 167 lbs.  Medical History Courtney Hale has a past medical history of Complication of anesthesia, Depression, Hemorrhoids, History of esophageal stricture, History of gastritis, History of melanoma excision, Hypertension, Microhematuria, Migraines, Nephrolithiasis, Right ureteral stone, Urgency of urination, and Wears contact lenses.   Outpatient Encounter Medications as of 01/30/2021  Medication Sig  . acetaminophen (TYLENOL) 500 MG tablet Take 500 mg by mouth  every 6 (six) hours as needed. As needed  . busPIRone (BUSPAR) 5 MG tablet Take 1 tablet (5 mg total) by mouth 3 (three) times daily.  . cetirizine (ZYRTEC ALLERGY) 10 MG tablet Take 1 tablet (10 mg total) by mouth daily.  . meclizine (ANTIVERT) 25 MG tablet Take 25 mg by mouth 3 (three) times daily as needed for dizziness.  . meloxicam (MOBIC) 7.5 MG tablet TAKE 1 TABLET BY MOUTH ONCE DAILY AS NEEDED MIGRAINE  . omeprazole (PRILOSEC) 20 MG capsule 1 PO 30 mins prior to breakfast and supper  . ondansetron (ZOFRAN ODT) 4 MG disintegrating tablet Take 1 tablet (4 mg total) by mouth every 8 (eight) hours as needed for nausea or vomiting.  Marland Kitchen OVER THE COUNTER MEDICATION Renew Women's probiotic   One daily  . OVER THE COUNTER MEDICATION Vit d 3 2,000 daily b12 2,000 daily Cinnamon 500 daily Green tea extract bid chrominum once daily  . PARoxetine (PAXIL) 40 MG tablet TAKE 1 TABLET BY MOUTH ONCE DAILY IN THE MORNING  . promethazine-dextromethorphan (PROMETHAZINE-DM) 6.25-15 MG/5ML syrup Take 5 mLs by mouth at bedtime as needed for cough.  . pseudoephedrine (SUDAFED) 60 MG tablet Take 1 tablet (60 mg total) by mouth every 8 (eight) hours as needed for congestion.  . rizatriptan (MAXALT-MLT) 10 MG disintegrating tablet Take 1 tablet (10 mg total) by mouth  as needed for migraine. May repeat in 2 hours if needed. Max 2 per 24 hours  . vitamin B-12 (CYANOCOBALAMIN) 1000 MCG tablet Take 1,000 mcg by mouth daily.  . [DISCONTINUED] clonazePAM (KLONOPIN) 1 MG tablet Take 0.5-1 tablets (0.5-1 mg total) by mouth daily as needed for anxiety.  . [DISCONTINUED] hydrochlorothiazide (HYDRODIURIL) 25 MG tablet Take 1 tablet (25 mg total) by mouth daily.  . [DISCONTINUED] losartan (COZAAR) 100 MG tablet Take 1 tablet (100 mg total) by mouth daily. Needs labs and appt.  . [DISCONTINUED] metFORMIN (GLUCOPHAGE) 850 MG tablet Take 1 tablet (850 mg total) by mouth 2 (two) times daily with a meal.  . [DISCONTINUED]  phentermine (ADIPEX-P) 37.5 MG tablet Take 1 tablet (37.5 mg total) by mouth daily before breakfast.  . [DISCONTINUED] traZODone (DESYREL) 50 MG tablet TAKE 1 TO 2 TABLETS BY MOUTH EVERY DAY AT BEDTIME AS NEEDED FOR INSOMNIA  . hydrochlorothiazide (HYDRODIURIL) 25 MG tablet Take 1 tablet (25 mg total) by mouth daily.  Marland Kitchen losartan (COZAAR) 100 MG tablet Take 1 tablet (100 mg total) by mouth daily. Needs labs and appt.  . metFORMIN (GLUCOPHAGE) 850 MG tablet Take 1 tablet (850 mg total) by mouth 2 (two) times daily with a meal.  . traZODone (DESYREL) 50 MG tablet Take 1-2 tab p.o. qhs.  . [DISCONTINUED] benzonatate (TESSALON) 100 MG capsule Take 1 capsule (100 mg total) by mouth 3 (three) times daily as needed for cough. (Patient not taking: No sig reported)  . [DISCONTINUED] benzonatate (TESSALON) 100 MG capsule Take 1-2 capsules (100-200 mg total) by mouth 3 (three) times daily as needed. (Patient not taking: Reported on 10/30/2020)   No facility-administered encounter medications on file as of 01/30/2021.     Review of Systems  Constitutional: Negative for chills and fever.  HENT: Negative for congestion, rhinorrhea and sore throat.   Respiratory: Negative for cough, shortness of breath and wheezing.   Cardiovascular: Negative for chest pain and leg swelling.  Gastrointestinal: Negative for abdominal pain, diarrhea, nausea and vomiting.  Genitourinary: Negative for dysuria and frequency.  Musculoskeletal: Negative for arthralgias and back pain.  Skin: Negative for rash.  Neurological: Negative for dizziness, weakness and headaches.     Vitals BP 110/68   Pulse 91   Temp 98.2 F (36.8 C)   Ht 5' 3.5" (1.613 m)   Wt 167 lb (75.8 kg)   LMP 10/26/2014   SpO2 96%   BMI 29.12 kg/m   Objective:   Physical Exam Vitals and nursing note reviewed.  Constitutional:      General: Courtney Hale is not in acute distress.    Appearance: Normal appearance. Courtney Hale is not ill-appearing.  HENT:     Head:  Normocephalic and atraumatic.     Nose: Nose normal.     Mouth/Throat:     Mouth: Mucous membranes are moist.     Pharynx: Oropharynx is clear.  Eyes:     Extraocular Movements: Extraocular movements intact.     Conjunctiva/sclera: Conjunctivae normal.     Pupils: Pupils are equal, round, and reactive to light.  Cardiovascular:     Rate and Rhythm: Normal rate and regular rhythm.     Pulses: Normal pulses.     Heart sounds: Normal heart sounds.  Pulmonary:     Effort: Pulmonary effort is normal.     Breath sounds: Normal breath sounds. No wheezing, rhonchi or rales.  Musculoskeletal:        General: Normal range of motion.  Right lower leg: No edema.     Left lower leg: No edema.  Skin:    General: Skin is warm and dry.     Findings: No lesion or rash.  Neurological:     General: No focal deficit present.     Mental Status: Courtney Hale is alert and oriented to person, place, and time.  Psychiatric:        Mood and Affect: Mood normal.        Behavior: Behavior normal.      Assessment and Plan   1. Primary hypertension - hydrochlorothiazide (HYDRODIURIL) 25 MG tablet; Take 1 tablet (25 mg total) by mouth daily.  Dispense: 90 tablet; Refill: 1  2. Hyperglycemia - metFORMIN (GLUCOPHAGE) 850 MG tablet; Take 1 tablet (850 mg total) by mouth 2 (two) times daily with a meal.  Dispense: 180 tablet; Refill: 1  3. Type 2 diabetes mellitus with hyperglycemia, without long-term current use of insulin (HCC) - metFORMIN (GLUCOPHAGE) 850 MG tablet; Take 1 tablet (850 mg total) by mouth 2 (two) times daily with a meal.  Dispense: 180 tablet; Refill: 1  4. Insomnia, unspecified type - traZODone (DESYREL) 50 MG tablet; Take 1-2 tab p.o. qhs.  Dispense: 60 tablet; Refill: 5   Anxiety- Will change from klonapin to buspar for anxiety. Cont with paxil.  If not getting better with anxiety, may try to change back to celexa for anxiety.  Was on this in past and did well.  Trying to wean off the  klonapin for anxiety since having concerns with her work and passing the urine drug screen.  Weight loss management- Pt finished her course of phentermine.  Had good weight loss success and lost about 15 lbs. Reviewed with pt to eat 3 small meals per day at least with protein and healthy fats and complex carbs and to increase in exercising.  Wt Readings from Last 3 Encounters:  01/30/21 167 lb (75.8 kg)  10/30/20 182 lb 3.2 oz (82.6 kg)  08/27/20 181 lb 6.4 oz (82.3 kg)   Insomnia- cont with trazodone for sleep.  DM2- stable. Cont meds.  Cont to monitor bg.  htn- stable. Cont losartan and hctz.  Return in about 6 months (around 08/01/2021) for f/u dm2, insomnia, htn.   01/30/2021

## 2021-02-17 ENCOUNTER — Other Ambulatory Visit: Payer: Self-pay | Admitting: Family Medicine

## 2021-02-17 DIAGNOSIS — F419 Anxiety disorder, unspecified: Secondary | ICD-10-CM

## 2021-02-17 DIAGNOSIS — F32A Depression, unspecified: Secondary | ICD-10-CM

## 2021-02-17 DIAGNOSIS — I10 Essential (primary) hypertension: Secondary | ICD-10-CM

## 2021-02-23 ENCOUNTER — Other Ambulatory Visit: Payer: Self-pay | Admitting: Family Medicine

## 2021-02-23 DIAGNOSIS — F32A Depression, unspecified: Secondary | ICD-10-CM

## 2021-03-09 ENCOUNTER — Ambulatory Visit (HOSPITAL_COMMUNITY)
Admission: RE | Admit: 2021-03-09 | Discharge: 2021-03-09 | Disposition: A | Discharge status: Home / Self Care | Payer: BC Managed Care – PPO | Source: Ambulatory Visit | Attending: Family Medicine | Admitting: Family Medicine

## 2021-03-09 ENCOUNTER — Other Ambulatory Visit: Payer: Self-pay

## 2021-03-09 ENCOUNTER — Encounter: Payer: Self-pay | Admitting: Family Medicine

## 2021-03-09 ENCOUNTER — Ambulatory Visit (INDEPENDENT_AMBULATORY_CARE_PROVIDER_SITE_OTHER): Payer: BC Managed Care – PPO | Admitting: Family Medicine

## 2021-03-09 VITALS — BP 125/88 | HR 83 | Temp 97.1°F | Wt 172.8 lb

## 2021-03-09 DIAGNOSIS — M25552 Pain in left hip: Secondary | ICD-10-CM | POA: Insufficient documentation

## 2021-03-09 DIAGNOSIS — D179 Benign lipomatous neoplasm, unspecified: Secondary | ICD-10-CM | POA: Diagnosis not present

## 2021-03-09 DIAGNOSIS — L0291 Cutaneous abscess, unspecified: Secondary | ICD-10-CM | POA: Diagnosis not present

## 2021-03-09 DIAGNOSIS — Z1211 Encounter for screening for malignant neoplasm of colon: Secondary | ICD-10-CM | POA: Diagnosis not present

## 2021-03-09 MED ORDER — CEPHALEXIN 500 MG PO CAPS: 500.0000 mg | ORAL_CAPSULE | Freq: Two times a day (BID) | ORAL | 0 refills | Status: DC

## 2021-03-09 NOTE — Progress Notes (Signed)
Patient ID: Courtney Hale, female    DOB: 1966-01-20, 55 y.o.   MRN: [id number]   Chief Complaint  Patient presents with  . knot on left hip- left hip pain   Subjective:  CC:  Left hip pain and knot   This is a new problem. Presents today with complaint of left hip pain and "knot" on hip. Symptoms have been present for 4 months, just noticed the "knot" . Denies injury to hip.  Reports that she has been having issues on the left side of her body, to include some left arm numbness as well.  Last lab work done April 4, she had a normal CBC  at that time.  Denies fever, chills endorses some shortness of breath and chest pain from umbilical hernia.    Medical History Courtney Hale has a past medical history of Complication of anesthesia, Depression, Hemorrhoids, History of esophageal stricture, History of gastritis, History of melanoma excision, Hypertension, Microhematuria, Migraines, Nephrolithiasis, Right ureteral stone, Urgency of urination, and Wears contact lenses.   Outpatient Encounter Medications as of 03/09/2021  Medication Sig  . cephALEXin (KEFLEX) 500 MG capsule Take 1 capsule (500 mg total) by mouth 2 (two) times daily.  Marland Kitchen acetaminophen (TYLENOL) 500 MG tablet Take 500 mg by mouth every 6 (six) hours as needed. As needed  . busPIRone (BUSPAR) 5 MG tablet Take 1 tablet (5 mg total) by mouth 3 (three) times daily.  . cetirizine (ZYRTEC ALLERGY) 10 MG tablet Take 1 tablet (10 mg total) by mouth daily.  . hydrochlorothiazide (HYDRODIURIL) 25 MG tablet Take 1 tablet by mouth once daily  . losartan (COZAAR) 100 MG tablet Take 1 tablet (100 mg total) by mouth daily. Needs labs and appt.  . meclizine (ANTIVERT) 25 MG tablet Take 25 mg by mouth 3 (three) times daily as needed for dizziness.  . meloxicam (MOBIC) 7.5 MG tablet TAKE 1 TABLET BY MOUTH ONCE DAILY AS NEEDED MIGRAINE  . metFORMIN (GLUCOPHAGE) 850 MG tablet Take 1 tablet (850 mg total) by mouth 2 (two) times daily with a meal.  .  omeprazole (PRILOSEC) 20 MG capsule 1 PO 30 mins prior to breakfast and supper  . ondansetron (ZOFRAN ODT) 4 MG disintegrating tablet Take 1 tablet (4 mg total) by mouth every 8 (eight) hours as needed for nausea or vomiting.  Marland Kitchen OVER THE COUNTER MEDICATION Renew Women's probiotic   One daily  . OVER THE COUNTER MEDICATION Vit d 3 2,000 daily b12 2,000 daily Cinnamon 500 daily Green tea extract bid chrominum once daily  . PARoxetine (PAXIL) 40 MG tablet TAKE 1 TABLET BY MOUTH ONCE DAILY IN THE MORNING  . promethazine-dextromethorphan (PROMETHAZINE-DM) 6.25-15 MG/5ML syrup Take 5 mLs by mouth at bedtime as needed for cough.  . pseudoephedrine (SUDAFED) 60 MG tablet Take 1 tablet (60 mg total) by mouth every 8 (eight) hours as needed for congestion.  . rizatriptan (MAXALT-MLT) 10 MG disintegrating tablet Take 1 tablet (10 mg total) by mouth as needed for migraine. May repeat in 2 hours if needed. Max 2 per 24 hours  . traZODone (DESYREL) 50 MG tablet Take 1-2 tab p.o. qhs.  . vitamin B-12 (CYANOCOBALAMIN) 1000 MCG tablet Take 1,000 mcg by mouth daily.   No facility-administered encounter medications on file as of 03/09/2021.     Review of Systems  Constitutional: Negative for chills and fever.  Respiratory: Positive for shortness of breath.   Cardiovascular: Positive for chest pain (umbilical hernia, and hematoma on liver).  Vitals BP 125/88   Pulse 83   Temp (!) 97.1 F (36.2 C) (Oral)   Wt 172 lb 12.8 oz (78.4 kg)   LMP 10/26/2014   SpO2 99%   BMI 30.13 kg/m   Objective:   Physical Exam Vitals reviewed.  Constitutional:      Appearance: Normal appearance.  Cardiovascular:     Rate and Rhythm: Normal rate and regular rhythm.     Heart sounds: Normal heart sounds.  Pulmonary:     Effort: Pulmonary effort is normal.     Breath sounds: Normal breath sounds.  Musculoskeletal:     Comments: Left hip lipoma vs. Infectious process. Firm, large area warm to touch, non  erythematous.  Skin:    General: Skin is warm and dry.  Neurological:     General: No focal deficit present.     Mental Status: She is alert.  Psychiatric:        Behavior: Behavior normal.      Assessment and Plan   1. Left hip pain - DG Hip Unilat W OR W/O Pelvis Min 4 Views Left  2. Cutaneous abscess, unspecified site - cephALEXin (KEFLEX) 500 MG capsule; Take 1 capsule (500 mg total) by mouth 2 (two) times daily.  Dispense: 20 capsule; Refill: 0 - DG Hip Unilat W OR W/O Pelvis Min 4 Views Left  3. Screening for colon cancer - Ambulatory referral to Gastroenterology  4. Lipoma, unspecified site - cephALEXin (KEFLEX) 500 MG capsule; Take 1 capsule (500 mg total) by mouth 2 (two) times daily.  Dispense: 20 capsule; Refill: 0 - DG Hip Unilat W OR W/O Pelvis Min 4 Views Left   Will send for stat left hip x-ray to rule out osteomyelitis.  Differential diagnosis include lipoma versus infectious process.  Will start on Keflex twice per day for 10 days.  Ambulatory referral for GI for routine colonoscopy placed today.  Update: no fracture. Prominence of the superficial soft tissues overlying the left hip without discrete mass or other lesion identified.  Results reviewed with PCP, Courtney Hale.   Agrees with plan of care discussed today. Understands warning signs to seek further care: chest pain, shortness of breath, any significant change in health.  Understands to follow-up in 2 weeks with PCP, Courtney Hale to follow-up on left hip pain/lipoma versus infectious process. Nurse to notify of x-ray results, will continue with plan to complete antibiotic and will follow-up with PCP in 2 weeks and further testing will be decided at that time. Case discussed with Courtney Hale today.    Courtney Ades, NP 03/09/21

## 2021-03-09 NOTE — Patient Instructions (Signed)
Lipoma  A lipoma is a noncancerous (benign) tumor that is made up of fat cells. This is a very common type of soft-tissue growth. Lipomas are usually found under the skin (subcutaneous). They may occur in any tissue of the body that contains fat. Common areas for lipomas to appear include the back, arms, shoulders, buttocks, and thighs. Lipomas grow slowly, and they are usually painless. Most lipomas do not cause problems and do not require treatment. What are the causes? The cause of this condition is not known. What increases the risk? You are more likely to develop this condition if:  You are 40-60 years old.  You have a family history of lipomas. What are the signs or symptoms? A lipoma usually appears as a small, round bump under the skin. In most cases, the lump will:  Feel soft or rubbery.  Not cause pain or other symptoms. However, if a lipoma is located in an area where it pushes on nerves, it can become painful or cause other symptoms. How is this diagnosed? A lipoma can usually be diagnosed with a physical exam. You may also have tests to confirm the diagnosis and to rule out other conditions. Tests may include:  Imaging tests, such as a CT scan or an MRI.  Removal of a tissue sample to be looked at under a microscope (biopsy). How is this treated? Treatment for this condition depends on the size of the lipoma and whether it is causing any symptoms.  For small lipomas that are not causing problems, no treatment is needed.  If a lipoma is bigger or it causes problems, surgery may be done to remove the lipoma. Lipomas can also be removed to improve appearance. Most often, the procedure is done after applying a medicine that numbs the area (local anesthetic).  Liposuction may be done to reduce the size of the lipoma before it is removed through surgery, or it may be done to remove the lipoma. Lipomas are removed with this method in order to limit incision size and scarring. A  liposuction tube is inserted through a small incision into the lipoma, and the contents of the lipoma are removed through the tube with suction. Follow these instructions at home:  Watch your lipoma for any changes.  Keep all follow-up visits as told by your health care provider. This is important. Contact a health care provider if:  Your lipoma becomes larger or hard.  Your lipoma becomes painful, red, or increasingly swollen. These could be signs of infection or a more serious condition. Get help right away if:  You develop tingling or numbness in an area near the lipoma. This could indicate that the lipoma is causing nerve damage. Summary  A lipoma is a noncancerous tumor that is made up of fat cells.  Most lipomas do not cause problems and do not require treatment.  If a lipoma is bigger or it causes problems, surgery may be done to remove the lipoma.  Contact a health care provider if your lipoma becomes larger or hard, or if it becomes painful, red, or increasingly swollen. Pain, redness, and swelling could be signs of infection or a more serious condition. This information is not intended to replace advice given to you by your health care provider. Make sure you discuss any questions you have with your health care provider. Document Revised: 05/28/2019 Document Reviewed: 05/28/2019 Elsevier Patient Education  2021 [employer] Inc[employer]  

## 2021-03-24 ENCOUNTER — Encounter: Payer: Self-pay | Admitting: Family Medicine

## 2021-03-24 ENCOUNTER — Ambulatory Visit: Payer: BC Managed Care – PPO | Admitting: Family Medicine

## 2021-03-24 ENCOUNTER — Other Ambulatory Visit: Payer: Self-pay

## 2021-03-24 VITALS — BP 140/82 | HR 79 | Temp 97.5°F | Ht 63.5 in | Wt 174.6 lb

## 2021-03-24 DIAGNOSIS — Z1211 Encounter for screening for malignant neoplasm of colon: Secondary | ICD-10-CM

## 2021-03-24 DIAGNOSIS — R2242 Localized swelling, mass and lump, left lower limb: Secondary | ICD-10-CM

## 2021-03-24 NOTE — Progress Notes (Signed)
Patient ID: Courtney Hale, female    DOB: [birth year], 55 y.o.   MRN: [id number]   Chief Complaint  Patient presents with   Hip Pain   Subjective:    HPI Pt here for follow up on hip pain. Pt states she is still hurting. No able to sleep on left side. Has bruising on left side now. Pt has been having muscle spasms on left side also. Pt has been alternating Tylenol and Ibuprofen; not much relief. Pt did complete antibiotics.  Pt has umbilical hernia and now has another knot pop up on left side and is painful.   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  Took abx, didn't change the lump on left hip.  Hasn't been able to sleep on left side since 11/22. Has been using ibuprofen, leaning on rt side to relieve the area on left hip.  Painful when sitting on it.  Noticed a bruise on the area, not sure if she hit it.   5x6 inch area on left buttock/left hip.   Medical History Sofi has a past medical history of Complication of anesthesia, Depression, Hemorrhoids, History of esophageal stricture, History of gastritis, History of melanoma excision, Hypertension, Microhematuria, Migraines, Nephrolithiasis, Right ureteral stone, Urgency of urination, and Wears contact lenses.   Outpatient Encounter Medications as of 03/24/2021  Medication Sig   acetaminophen (TYLENOL) 500 MG tablet Take 500 mg by mouth every 6 (six) hours as needed. As needed   cetirizine (ZYRTEC ALLERGY) 10 MG tablet Take 1 tablet (10 mg total) by mouth daily.   hydrochlorothiazide (HYDRODIURIL) 25 MG tablet Take 1 tablet by mouth once daily   losartan (COZAAR) 100 MG tablet Take 1 tablet (100 mg total) by mouth daily. Needs labs and appt.   meclizine (ANTIVERT) 25 MG tablet Take 25 mg by mouth 3 (three) times daily as needed for dizziness.   meloxicam (MOBIC) 7.5 MG tablet TAKE 1 TABLET BY MOUTH ONCE DAILY AS NEEDED MIGRAINE   metFORMIN (GLUCOPHAGE) 850 MG tablet Take 1 tablet (850 mg total) by mouth 2  (two) times daily with a meal.   omeprazole (PRILOSEC) 20 MG capsule 1 PO 30 mins prior to breakfast and supper   ondansetron (ZOFRAN ODT) 4 MG disintegrating tablet Take 1 tablet (4 mg total) by mouth every 8 (eight) hours as needed for nausea or vomiting.   OVER THE COUNTER MEDICATION Renew Women's probiotic   One daily   OVER THE COUNTER MEDICATION Vit d 3 2,000 daily b12 2,000 daily Cinnamon 500 daily Green tea extract bid chrominum once daily   PARoxetine (PAXIL) 40 MG tablet TAKE 1 TABLET BY MOUTH ONCE DAILY IN THE MORNING   promethazine-dextromethorphan (PROMETHAZINE-DM) 6.25-15 MG/5ML syrup Take 5 mLs by mouth at bedtime as needed for cough.   pseudoephedrine (SUDAFED) 60 MG tablet Take 1 tablet (60 mg total) by mouth every 8 (eight) hours as needed for congestion.   rizatriptan (MAXALT-MLT) 10 MG disintegrating tablet Take 1 tablet (10 mg total) by mouth as needed for migraine. May repeat in 2 hours if needed. Max 2 per 24 hours   vitamin B-12 (CYANOCOBALAMIN) 1000 MCG tablet Take 1,000 mcg by mouth daily.   [DISCONTINUED] busPIRone (BUSPAR) 5 MG tablet Take 1 tablet (5 mg total) by mouth 3 (three) times daily.   [DISCONTINUED] traZODone (DESYREL) 50 MG tablet Take 1-2 tab p.o. qhs.   [DISCONTINUED] cephALEXin (KEFLEX) 500 MG capsule Take 1 capsule (500 mg total) by  mouth 2 (two) times daily.   No facility-administered encounter medications on file as of 03/24/2021.     Review of Systems  Musculoskeletal:        +left posterior hip mass.  All other systems reviewed and are negative.   Vitals BP 140/82   Pulse 79   Temp (!) 97.5 F (36.4 C)   Ht 5' 3.5" (1.613 m)   Wt 174 lb 9.6 oz (79.2 kg)   LMP 10/26/2014   SpO2 98%   BMI 30.44 kg/m   Objective:   Physical Exam Vitals and nursing note reviewed.  Constitutional:      Appearance: Normal appearance.  HENT:     Head: Normocephalic and atraumatic.     Nose: Nose normal.     Mouth/Throat:     Mouth: Mucous  membranes are moist.     Pharynx: Oropharynx is clear.  Eyes:     Extraocular Movements: Extraocular movements intact.     Conjunctiva/sclera: Conjunctivae normal.     Pupils: Pupils are equal, round, and reactive to light.  Cardiovascular:     Rate and Rhythm: Normal rate and regular rhythm.     Pulses: Normal pulses.     Heart sounds: Normal heart sounds.  Pulmonary:     Effort: Pulmonary effort is normal.     Breath sounds: Normal breath sounds. No wheezing, rhonchi or rales.  Musculoskeletal:        General: No swelling or tenderness. Normal range of motion.     Right lower leg: No edema.     Left lower leg: No edema.     Comments: 5x6 inch mass on left buttock/left posterior hip  Skin:    General: Skin is warm and dry.     Findings: No lesion or rash.  Neurological:     General: No focal deficit present.     Mental Status: She is alert and oriented to person, place, and time.  Psychiatric:        Mood and Affect: Mood normal.        Behavior: Behavior normal.     Assessment and Plan   1. Mass of hip region, left - [place] LT LOWER EXTREM LTD SOFT TISSUE NON VASCULAR  2. Colon cancer screening - Ambulatory referral to Gastroenterology   Xray of left hip- negative for bony abnormality.  Showing soft tissue mass, recommending u/s area.  Ordered u/s.  Rest, ice, nsaid, tylenol.    Return if symptoms worsen or fail to improve.

## 2021-03-25 ENCOUNTER — Other Ambulatory Visit: Payer: Self-pay | Admitting: Family Medicine

## 2021-03-25 DIAGNOSIS — G4700 Insomnia, unspecified: Secondary | ICD-10-CM

## 2021-03-25 NOTE — Telephone Encounter (Signed)
Med check up 01/30/21

## 2021-03-26 ENCOUNTER — Telehealth: Payer: Self-pay | Admitting: Family Medicine

## 2021-03-30 ENCOUNTER — Ambulatory Visit: Payer: Self-pay | Admitting: Orthopedic Surgery

## 2021-03-31 ENCOUNTER — Ambulatory Visit (HOSPITAL_COMMUNITY): Payer: BC Managed Care – PPO

## 2021-04-08 ENCOUNTER — Ambulatory Visit (HOSPITAL_COMMUNITY)
Admission: RE | Admit: 2021-04-08 | Discharge: 2021-04-08 | Disposition: A | Discharge status: Home / Self Care | Payer: BC Managed Care – PPO | Source: Ambulatory Visit | Attending: Family Medicine | Admitting: Family Medicine

## 2021-04-08 ENCOUNTER — Other Ambulatory Visit: Payer: Self-pay

## 2021-04-08 DIAGNOSIS — R2242 Localized swelling, mass and lump, left lower limb: Secondary | ICD-10-CM | POA: Insufficient documentation

## 2021-04-08 NOTE — Addendum Note (Signed)
Addended by: Vicente Males on: 04/08/2021 04:17 PM   Modules accepted: Orders

## 2021-04-30 ENCOUNTER — Ambulatory Visit: Payer: BC Managed Care – PPO | Admitting: General Surgery

## 2021-04-30 ENCOUNTER — Encounter: Payer: Self-pay | Admitting: General Surgery

## 2021-04-30 ENCOUNTER — Other Ambulatory Visit: Payer: Self-pay

## 2021-04-30 VITALS — BP 124/80 | HR 73 | Temp 98.7°F | Resp 18 | Ht 63.5 in | Wt 173.0 lb

## 2021-04-30 DIAGNOSIS — R2242 Localized swelling, mass and lump, left lower limb: Secondary | ICD-10-CM

## 2021-04-30 NOTE — Progress Notes (Signed)
Courtney Hale; [id number]; [birth year]   HPI Patient is a 55 year old white female who was referred to my care by Lurline Del for evaluation and treatment of a mass on her left thigh.  Patient states is been present for over 4 months.  It causes her pain in the left hip.  The pain can be a shooting pain.  Is made worse with movement or pressure on the left hip.  She had an ultrasound of the left soft tissue of the hip which was unremarkable.  It was suggested that the patient have an MRI to further evaluate the area.  She denies any trauma to the left hip.  She also complains of swelling along the upper midportion of the abdomen.  Is made worse with straining or sitting up.  No nausea or vomiting have been noted. Past Medical History:  Diagnosis Date   Complication of anesthesia    woke up during endoscopy and colonoscopy   Depression    Hemorrhoids    History of esophageal stricture    2011  peptic w/  dilatation   History of gastritis    2011   History of melanoma excision    2015  left leg/   12-09-2015 back    Hypertension    Microhematuria    Migraines    Nephrolithiasis    left side non-obstructive    Right ureteral stone    Urgency of urination    Wears contact lenses     Past Surgical History:  Procedure Laterality Date   ABDOMINAL HYSTERECTOMY     CARPAL TUNNEL RELEASE Right 01-05- 2015   COLONOSCOPY N/A 08/27/2015   Procedure: COLONOSCOPY;  Surgeon: Danie Binder, MD;  Location: [facility];  Service: Endoscopy;  Laterality: N/A;  0830   CYST EXCISION  2012     right hand   CYSTOSCOPY W/ URETERAL STENT PLACEMENT Right 12/22/2015   Procedure: CYSTOSCOPY WITH RETROGRADE PYELOGRAM/URETERAL STENT PLACEMENT;  Surgeon: Nickie Retort, MD;  Location: [facility];  Service: Urology;  Laterality: Right;   CYSTOSCOPY WITH RETROGRADE PYELOGRAM, URETEROSCOPY AND STENT PLACEMENT Right 01/05/2016   Procedure: CYSTOSCOPY WITH RETROGRADE PYELOGRAM, URETEROSCOPY AND STENT REPLACEMENT;   Surgeon: Nickie Retort, MD;  Location: [facility];  Service: Urology;  Laterality: Right;   DILATION AND CURETTAGE OF UTERUS  03-12-2009   w/  Suction   double balloon enteroscopy     Dr. Arsenio Loader at [facility]: no erosions, no evidence of Crohn's disease, path without Crohn's.    EGD with push enteroscopy  02-12-2010    patent distal peptic stricture with diffuse antral erythema, normal D1 and D2    LAPAROSCOPIC ASSISTED VAGINAL HYSTERECTOMY  10-26-2014   w/  Bilateral Salpingoophorectomy   STONE EXTRACTION WITH BASKET Right 01/05/2016   Procedure: STONE EXTRACTION WITH BASKET;  Surgeon: Nickie Retort, MD;  Location: [facility];  Service: Urology;  Laterality: Right;   TONSILLECTOMY  1998  approx    Family History  Problem Relation Age of Onset   Diabetes Mother    Colon cancer Neg Hx     Current Outpatient Medications on File Prior to Visit  Medication Sig Dispense Refill   acetaminophen (TYLENOL) 500 MG tablet Take 500 mg by mouth every 6 (six) hours as needed. As needed     busPIRone (BUSPAR) 5 MG tablet TAKE 1 TABLET BY MOUTH THREE TIMES DAILY 45 tablet 2   cetirizine (ZYRTEC ALLERGY) 10 MG tablet Take 1 tablet (10  mg total) by mouth daily. 30 tablet 0   hydrochlorothiazide (HYDRODIURIL) 25 MG tablet Take 1 tablet by mouth once daily 90 tablet 0   losartan (COZAAR) 100 MG tablet Take 1 tablet (100 mg total) by mouth daily. Needs labs and appt. 90 tablet 1   meclizine (ANTIVERT) 25 MG tablet Take 25 mg by mouth 3 (three) times daily as needed for dizziness.     meloxicam (MOBIC) 7.5 MG tablet TAKE 1 TABLET BY MOUTH ONCE DAILY AS NEEDED MIGRAINE 30 tablet 2   metFORMIN (GLUCOPHAGE) 850 MG tablet Take 1 tablet (850 mg total) by mouth 2 (two) times daily with a meal. 180 tablet 1   omeprazole (PRILOSEC) 20 MG capsule 1 PO 30 mins prior to breakfast and supper 60 capsule 11   ondansetron (ZOFRAN ODT) 4 MG disintegrating tablet Take 1 tablet (4 mg  total) by mouth every 8 (eight) hours as needed for nausea or vomiting. 16 tablet 0   OVER THE COUNTER MEDICATION Renew Women's probiotic   One daily     OVER THE COUNTER MEDICATION Vit d 3 2,000 daily b12 2,000 daily Cinnamon 500 daily Green tea extract bid chrominum once daily     PARoxetine (PAXIL) 40 MG tablet TAKE 1 TABLET BY MOUTH ONCE DAILY IN THE MORNING 30 tablet 2   pseudoephedrine (SUDAFED) 60 MG tablet Take 1 tablet (60 mg total) by mouth every 8 (eight) hours as needed for congestion. 30 tablet 0   rizatriptan (MAXALT-MLT) 10 MG disintegrating tablet Take 1 tablet (10 mg total) by mouth as needed for migraine. May repeat in 2 hours if needed. Max 2 per 24 hours 10 tablet 1   traZODone (DESYREL) 50 MG tablet TAKE 1 TO 2 TABLETS BY MOUTH EVERY DAY AT BEDTIME AS NEEDED FOR INSOMNIA 60 tablet 2   vitamin B-12 (CYANOCOBALAMIN) 1000 MCG tablet Take 1,000 mcg by mouth daily.     No current facility-administered medications on file prior to visit.    Allergies  Allergen Reactions   Topamax [Topiramate] Other (See Comments)    Developed kidney stones   Sulfa Antibiotics Other (See Comments)    Burning sensation, flushing to skin    Social History   Substance and Sexual Activity  Alcohol Use Yes   Comment: occassionally    Social History   Tobacco Use  Smoking Status Never  Smokeless Tobacco Never    Review of Systems  Constitutional:  Positive for malaise/fatigue.  HENT: Negative.    Eyes: Negative.   Respiratory: Negative.    Cardiovascular:  Positive for chest pain.  Gastrointestinal:  Positive for abdominal pain, heartburn and nausea.  Genitourinary: Negative.   Musculoskeletal: Negative.   Skin: Negative.   Neurological:  Positive for dizziness and headaches.  Endo/Heme/Allergies: Negative.   Psychiatric/Behavioral: Negative.     Objective   Vitals:   04/30/21 1024  BP: 124/80  Pulse: 73  Resp: 18  Temp: 98.7 F (37.1 C)  SpO2: 96%    Physical  Exam Vitals reviewed.  Constitutional:      Appearance: Normal appearance. She is not ill-appearing.  HENT:     Head: Normocephalic and atraumatic.  Cardiovascular:     Rate and Rhythm: Normal rate and regular rhythm.     Heart sounds: Normal heart sounds. No murmur heard.   No friction rub. No gallop.  Pulmonary:     Effort: Pulmonary effort is normal. No respiratory distress.     Breath sounds: Normal breath sounds. No stridor.  No wheezing, rhonchi or rales.  Abdominal:     General: Bowel sounds are normal. There is no distension.     Palpations: Abdomen is soft. There is no mass.     Tenderness: There is no abdominal tenderness. There is no guarding or rebound.     Hernia: A hernia is present.     Comments: Diastases recti present.  Minimal small umbilical hernia.  Musculoskeletal:     Comments: Indistinct oval swelling of the soft tissue lateral to the hip.  Somewhat tender to touch.  No overlying skin changes noted.  It is mobile.  Skin:    General: Skin is warm and dry.  Neurological:     Mental Status: She is alert and oriented to person, place, and time.   Ultrasound report reviewed Assessment  Soft tissue mass, left thigh.  Cannot tell if this is a lipoma versus subcutaneous reaction to a deeper musculoskeletal issue. Diastases recti, asymptomatic umbilical hernia Plan  Will get MRI of the left thigh to further assess the soft tissue mass.  Further management is pending those results.  Literature given concerning diastases recti.  No need to repair umbilical hernia.

## 2021-05-14 ENCOUNTER — Ambulatory Visit (HOSPITAL_COMMUNITY)
Admission: RE | Admit: 2021-05-14 | Discharge: 2021-05-14 | Disposition: A | Discharge status: Home / Self Care | Payer: Medicaid Other | Source: Ambulatory Visit | Attending: General Surgery | Admitting: General Surgery

## 2021-05-14 ENCOUNTER — Other Ambulatory Visit: Payer: Self-pay

## 2021-05-14 DIAGNOSIS — R2242 Localized swelling, mass and lump, left lower limb: Secondary | ICD-10-CM

## 2021-05-14 MED ORDER — GADOBUTROL 1 MMOL/ML IV SOLN
7.0000 mL | Freq: Once | INTRAVENOUS | Status: AC | PRN
  Administered 2021-05-14: 7 mL via INTRAVENOUS

## 2021-05-15 ENCOUNTER — Ambulatory Visit (HOSPITAL_COMMUNITY): Payer: BC Managed Care – PPO

## 2021-05-19 ENCOUNTER — Telehealth (INDEPENDENT_AMBULATORY_CARE_PROVIDER_SITE_OTHER): Payer: BC Managed Care – PPO | Admitting: General Surgery

## 2021-05-19 ENCOUNTER — Other Ambulatory Visit (INDEPENDENT_AMBULATORY_CARE_PROVIDER_SITE_OTHER): Payer: BC Managed Care – PPO | Admitting: General Surgery

## 2021-05-19 DIAGNOSIS — Z09 Encounter for follow-up examination after completed treatment for conditions other than malignant neoplasm: Secondary | ICD-10-CM

## 2021-05-19 DIAGNOSIS — R2242 Localized swelling, mass and lump, left lower limb: Secondary | ICD-10-CM

## 2021-05-19 NOTE — Progress Notes (Signed)
Ordered MRI of pelvis with/without contrast.

## 2021-05-19 NOTE — Telephone Encounter (Signed)
Reviewed MRI results with patient.  There is no soft tissue lesion in the left hip that would explain her pain.  There were other findings in the pelvis that radiology suggests a follow-up MRI of the pelvis with and without contrast.  This was explained to the patient who understands.  We will set up MRI for the patient.  Further management is pending those results.   MR FEMUR LEFT W WO CONTRAST (Accession [id number]) (Order [id number]) Imaging Date: 05/14/2021 Department: [facility] Released By: Timoteo Expose Authorizing: Aviva Signs, MD    Exam Status  Status  Final [99]   PACS Intelerad Image Link   Show images for MR FEMUR LEFT W WO CONTRAST  Study Result  Narrative & Impression  CLINICAL DATA:  Palpable mass along the left side of the hip for 7 months. No known injury.   EXAM: MR OF THE LEFT LOWER EXTREMITY WITHOUT AND WITH CONTRAST   TECHNIQUE: Multiplanar, multisequence MR imaging of the left thigh was performed both before and after administration of intravenous contrast.   CONTRAST:  39m GADAVIST GADOBUTROL 1 MMOL/ML IV SOLN   COMPARISON:  None.   FINDINGS: Bones/Joint/Cartilage   No fracture or dislocation. Normal alignment. No joint effusion.   Indeterminate T1 hypointense bone lesion measuring 10 mm in the S1 vertebral body. Indeterminate 8 mm T1 hypointense bone lesion in the right-sided sacrum adjacent to the SI joint. These are incompletely characterized on this examination.   Ligaments   Collateral ligaments of knees are intact.   Muscles and Tendons   Flexors: Normal.   Extensors: Normal.   Abductors: Normal.   Adductors: Normal.   Rotators: Normal.   Hamstrings: Normal.   Soft tissue No fluid collection or hematoma.  No soft tissue mass.   IMPRESSION: 1. No soft tissue mass, fluid collection or hematoma at the site of clinical concern in the left lateral thigh. 2. Indeterminate bone lesions in the S1 vertebral body,  right sacrum, and left posterior intertrochanteric region. Recommend further characterization with an MRI of the pelvis without and with intravenous contrast.     Electronically Signed   By: HKathreen Devoid  On: 05/15/2021 09:56      Result History  MR FEMUR LEFT W WO CONTRAST (Order #[id number] on 05/15/2021 - Order Result History Report  MyChart Results Release  MyChart Status: Active  Results Release    Encounter-Level Documents on 05/14/2021:  Electronic signature on 05/14/2021 2:34 PM - 1 of 3 e-signatures recorded Electronic signature on 05/13/2021 4:23 PM - 1 of 3 e-signatures recorded       Order-Level Documents:  There are no order-level documents.  Hospital Account-Level Documents:  There are no hospital account-level documents.  Orders Requiring a Screening Form  Procedure Order Status Form Status   MR FEMUR LEFT W WO CONTRAST Completed Completed    Vitals  Height Weight BMI (Calculated)  5' 3.5" (1.613 m) 173 lb (78.5 kg) 30.16   Assessment/Recommendation  Assessment Side Recommendation       Imaging  Imaging Information    MR FEMUR LEFT W WO CONTRAST: Patient Communication   Add Comments   Seen    Resulted by:  Signed Date/Time  Phone Pager  PJennette Banker7/22/2022  9:56 AM [phone]   Encounter Imaging Findings  MR FEMUR LEFT W WO CONTRAST Order #: [id number] #: [id number]  Finding Acuity Linked Recommendation Recommendation Status Finding Status  Incidental Incidental Follow-up with PCP Needs Follow-up  [place] Physician  Jennette Banker, MD   [phone]        Reference Links  MRI PROTOCOL          Study Notes     Alex Gardener on 05/14/2021  3:20 PM  Palpable mass left hip x 7 months, nki   Imaging Related Medications   Medication  gadobutrol (GADAVIST) 1 MMOL/ML injection 7 mL (Completed)  Route:   Intravenous  Admin Amount:   7 mL  Volume:   7.5 mL  PRN Reason(s):   contrast  Last  Admin Time:   05/14/21 1520  Number of Expected Doses:   1  Most Recent Administration:  User Action Time Recorded Time Dose Route Site Comment Action Reason  Alex Gardener 05/14/21 1520 05/14/21 1520 7 mL Intravenous   Contrast Given   Full Administration Report              Original Order  Ordered On Ordered By   04/30/2021 12:37 PM Aviva Signs, MD         External Result Report  External Result Report   Existing Charges  Charge Line Charge Code Status Charge Trigger Charge Type  [id number] Gilford Silvius [facility] Housekeeper Cm [id number] [facility] Billing Imaging end exam Technical  [id number] Mri, Lower Extr, W/O Contrast F/U By Contrast [id number] [facility]  - Radiology 3rd Party Billing Imaging result study Professional

## 2021-05-27 ENCOUNTER — Other Ambulatory Visit: Payer: Self-pay | Admitting: Family Medicine

## 2021-05-27 DIAGNOSIS — F419 Anxiety disorder, unspecified: Secondary | ICD-10-CM

## 2021-05-27 DIAGNOSIS — F32A Depression, unspecified: Secondary | ICD-10-CM

## 2021-06-02 ENCOUNTER — Ambulatory Visit (HOSPITAL_COMMUNITY): Payer: Medicaid Other

## 2021-06-09 ENCOUNTER — Ambulatory Visit: Payer: Medicaid Other | Admitting: General Surgery

## 2021-06-10 ENCOUNTER — Telehealth: Payer: Self-pay | Admitting: Family Medicine

## 2021-06-10 NOTE — Telephone Encounter (Signed)
Prior authorization submitted to [email or web address] for MRI pelvis with & w/out contrast. Request for more information and MRI femur sent with confirmation. Awaiting decision.

## 2021-06-11 ENCOUNTER — Encounter: Payer: Self-pay | Admitting: General Surgery

## 2021-06-11 ENCOUNTER — Ambulatory Visit (HOSPITAL_COMMUNITY): Payer: Medicaid Other

## 2021-06-11 ENCOUNTER — Encounter (HOSPITAL_COMMUNITY): Payer: Self-pay

## 2021-06-11 ENCOUNTER — Other Ambulatory Visit: Payer: Self-pay

## 2021-06-11 ENCOUNTER — Ambulatory Visit (INDEPENDENT_AMBULATORY_CARE_PROVIDER_SITE_OTHER): Payer: Medicaid Other | Admitting: General Surgery

## 2021-06-11 VITALS — BP 120/78 | HR 82 | Temp 98.9°F | Resp 14 | Ht 63.5 in | Wt 175.0 lb

## 2021-06-11 DIAGNOSIS — R2242 Localized swelling, mass and lump, left lower limb: Secondary | ICD-10-CM

## 2021-06-11 NOTE — Progress Notes (Signed)
Subjective:     Courtney Hale  Patient presented for review of her MRI results.  We have been trying to do a pelvic MRI with and without contrast as recommended by her original MRI report.  No left thigh soft tissue mass was seen.  They did see some lesions in the sacrum and at T1.  They suggested a pelvic MRI with and without contrast for follow-up.  Patient states she still has left thigh pain as well as lower back spasms. Objective:    BP 120/78   Pulse 82   Temp 98.9 F (37.2 C) (Other (Comment))   Resp 14   Ht 5' 3.5" (1.613 m)   Wt 175 lb (79.4 kg)   LMP 10/26/2014   SpO2 96%   BMI 30.51 kg/m   General:  alert, cooperative, and no distress       Assessment:    Left thigh pain of unknown etiology    Plan:   She has seen [facility] in the past for hand surgery.  I recommended that she contact them for follow-up of this abnormal MRI.  We will still try to get an MRI of her pelvis as recommended.  There is not much more I can offer her from the general surgery standpoint.  She understands this.  Follow-up here as needed.

## 2021-06-12 NOTE — Telephone Encounter (Signed)
Insurance requested additional information - faxed lov with confirmation.

## 2021-06-15 NOTE — Telephone Encounter (Signed)
Insurance has approved MRI Request ID -  [id number] - Tracking # [id number] Effective 06/10/2021 - 07/10/2021  Pt aware

## 2021-06-24 ENCOUNTER — Ambulatory Visit (HOSPITAL_COMMUNITY)
Admission: RE | Admit: 2021-06-24 | Discharge: 2021-06-24 | Disposition: A | Discharge status: Home / Self Care | Payer: Medicaid Other | Source: Ambulatory Visit | Attending: General Surgery | Admitting: General Surgery

## 2021-06-24 ENCOUNTER — Other Ambulatory Visit: Payer: Self-pay

## 2021-06-24 DIAGNOSIS — R2242 Localized swelling, mass and lump, left lower limb: Secondary | ICD-10-CM | POA: Diagnosis present

## 2021-06-24 MED ORDER — GADOBUTROL 1 MMOL/ML IV SOLN
7.5000 mL | Freq: Once | INTRAVENOUS | Status: AC | PRN
  Administered 2021-06-24: 7.5 mL via INTRAVENOUS

## 2021-06-25 ENCOUNTER — Other Ambulatory Visit: Payer: Self-pay | Admitting: Family Medicine

## 2021-06-25 DIAGNOSIS — F419 Anxiety disorder, unspecified: Secondary | ICD-10-CM

## 2021-06-30 ENCOUNTER — Telehealth (INDEPENDENT_AMBULATORY_CARE_PROVIDER_SITE_OTHER): Payer: Medicaid Other | Admitting: General Surgery

## 2021-06-30 ENCOUNTER — Inpatient Hospital Stay (HOSPITAL_COMMUNITY): Payer: Medicaid Other | Attending: Hematology | Admitting: Hematology

## 2021-06-30 ENCOUNTER — Other Ambulatory Visit (HOSPITAL_COMMUNITY): Payer: Self-pay

## 2021-06-30 ENCOUNTER — Other Ambulatory Visit: Payer: Self-pay

## 2021-06-30 ENCOUNTER — Encounter (HOSPITAL_COMMUNITY): Payer: Self-pay | Admitting: Hematology

## 2021-06-30 DIAGNOSIS — I10 Essential (primary) hypertension: Secondary | ICD-10-CM | POA: Diagnosis not present

## 2021-06-30 DIAGNOSIS — Z800 Family history of malignant neoplasm of digestive organs: Secondary | ICD-10-CM | POA: Insufficient documentation

## 2021-06-30 DIAGNOSIS — R937 Abnormal findings on diagnostic imaging of other parts of musculoskeletal system: Secondary | ICD-10-CM | POA: Insufficient documentation

## 2021-06-30 DIAGNOSIS — R519 Headache, unspecified: Secondary | ICD-10-CM | POA: Insufficient documentation

## 2021-06-30 DIAGNOSIS — Z90722 Acquired absence of ovaries, bilateral: Secondary | ICD-10-CM | POA: Insufficient documentation

## 2021-06-30 DIAGNOSIS — M545 Low back pain, unspecified: Secondary | ICD-10-CM | POA: Diagnosis not present

## 2021-06-30 DIAGNOSIS — Z806 Family history of leukemia: Secondary | ICD-10-CM | POA: Diagnosis not present

## 2021-06-30 DIAGNOSIS — Z85820 Personal history of malignant melanoma of skin: Secondary | ICD-10-CM | POA: Insufficient documentation

## 2021-06-30 DIAGNOSIS — M899 Disorder of bone, unspecified: Secondary | ICD-10-CM | POA: Insufficient documentation

## 2021-06-30 DIAGNOSIS — Z79899 Other long term (current) drug therapy: Secondary | ICD-10-CM | POA: Diagnosis not present

## 2021-06-30 DIAGNOSIS — C7951 Secondary malignant neoplasm of bone: Secondary | ICD-10-CM

## 2021-06-30 DIAGNOSIS — Z90710 Acquired absence of both cervix and uterus: Secondary | ICD-10-CM | POA: Diagnosis not present

## 2021-06-30 DIAGNOSIS — Z801 Family history of malignant neoplasm of trachea, bronchus and lung: Secondary | ICD-10-CM | POA: Diagnosis not present

## 2021-06-30 DIAGNOSIS — Z7984 Long term (current) use of oral hypoglycemic drugs: Secondary | ICD-10-CM | POA: Diagnosis not present

## 2021-06-30 DIAGNOSIS — Z8041 Family history of malignant neoplasm of ovary: Secondary | ICD-10-CM | POA: Diagnosis not present

## 2021-06-30 DIAGNOSIS — Z9079 Acquired absence of other genital organ(s): Secondary | ICD-10-CM | POA: Insufficient documentation

## 2021-06-30 DIAGNOSIS — M25552 Pain in left hip: Secondary | ICD-10-CM | POA: Diagnosis not present

## 2021-06-30 DIAGNOSIS — Z09 Encounter for follow-up examination after completed treatment for conditions other than malignant neoplasm: Secondary | ICD-10-CM

## 2021-06-30 LAB — COMPREHENSIVE METABOLIC PANEL
ALT: 24 U/L (ref 0–44)
AST: 20 U/L (ref 15–41)
Albumin: 3.9 g/dL (ref 3.5–5.0)
Alkaline Phosphatase: 73 U/L (ref 38–126)
Anion gap: 8 (ref 5–15)
BUN: 12 mg/dL (ref 6–20)
CO2: 24 mmol/L (ref 22–32)
Calcium: 9 mg/dL (ref 8.9–10.3)
Chloride: 107 mmol/L (ref 98–111)
Creatinine, Ser: 0.59 mg/dL (ref 0.44–1.00)
GFR, Estimated: >60 mL/min (ref >60)
Glucose, Bld: 82 mg/dL (ref 70–99)
Potassium: 3.4 mmol/L — ABNORMAL LOW (ref 3.5–5.1)
Sodium: 139 mmol/L (ref 135–145)
Total Bilirubin: 0.3 mg/dL (ref 0.3–1.2)
Total Protein: 6.7 g/dL (ref 6.5–8.1)

## 2021-06-30 LAB — CBC WITH DIFFERENTIAL/PLATELET
Abs Immature Granulocytes: 0.02 10*3/uL (ref 0.00–0.07)
Basophils Absolute: 0 10*3/uL (ref 0.0–0.1)
Basophils Relative: 1 %
Eosinophils Absolute: 0.2 10*3/uL (ref 0.0–0.5)
Eosinophils Relative: 2 %
HCT: 38 % (ref 36.0–46.0)
Hemoglobin: 13 g/dL (ref 12.0–15.0)
Immature Granulocytes: 0 %
Lymphocytes Relative: 45 %
Lymphs Abs: 3.2 10*3/uL (ref 0.7–4.0)
MCH: 30.2 pg (ref 26.0–34.0)
MCHC: 34.2 g/dL (ref 30.0–36.0)
MCV: 88.2 fL (ref 80.0–100.0)
Monocytes Absolute: 0.6 10*3/uL (ref 0.1–1.0)
Monocytes Relative: 9 %
Neutro Abs: 3 10*3/uL (ref 1.7–7.7)
Neutrophils Relative %: 43 %
Platelets: 265 10*3/uL (ref 150–400)
RBC: 4.31 MIL/uL (ref 3.87–5.11)
RDW: 13.1 % (ref 11.5–15.5)
WBC: 7 10*3/uL (ref 4.0–10.5)
nRBC: 0 % (ref 0.0–0.2)

## 2021-06-30 LAB — LACTATE DEHYDROGENASE: LDH: 139 U/L (ref 98–192)

## 2021-06-30 MED ORDER — TRAMADOL HCL 50 MG PO TABS: 50.0000 mg | ORAL_TABLET | Freq: Four times a day (QID) | ORAL | 0 refills | Status: DC | PRN

## 2021-06-30 NOTE — Telephone Encounter (Signed)
MRI results reviewed with patient.  She was aware of the results thru [place].  Will refer her to Dr. Delton Coombes of Oncology for further evaluation and treatment.

## 2021-06-30 NOTE — Progress Notes (Signed)
[facility] [address] [place] [address]   CLINIC:  Medical Oncology/Hematology  CONSULT NOTE  Patient Care Team: Erven Colla, DO as PCP - General (Family Medicine) Danie Binder, MD (Inactive) as Consulting Physician (Gastroenterology) Brien Mates, RN as Oncology Nurse Navigator (Oncology) Derek Jack, MD as Medical Oncologist (Oncology)  CHIEF COMPLAINTS/PURPOSE OF CONSULTATION:  Evaluation of bone lesions on MRI.  HISTORY OF PRESENTING ILLNESS:  [name] 55 y.o. female is here because of abnormal CT, at the request of Dr. Arnoldo Morale.  Today she reports feeling well, and she is accompanied by her mother. She reports hip pain associated with a lump on her left hip beginning in February and back pain beginning in August. She is currently taking Tylenol for this pain. She has a history of melanoma in her back and leg occurring about 15-20 years ago. She denies unexpected weight loss. She reports vision changes associated with dryness in her left eye. She denies double-vision but reports a lack of focus and decreased concentration. She has headaches, and she reports a history of migraines. However, she reports these headaches have been more severe than her previous headaches and have results in occasional episodes of nausea and vomiting. 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 She denies any smoking history. One maternal aunt had throat cancer, another maternal aunt had bone cancer, and another maternal aunt had pancreatic cancer. Her niece has ovarian cancer, and a paternal uncle has leukemia.   MEDICAL HISTORY:  Past Medical History:  Diagnosis Date   Complication of anesthesia    woke up during endoscopy and colonoscopy   Depression    Hemorrhoids    History of esophageal stricture    2011  peptic w/  dilatation   History of gastritis    2011   History of melanoma excision     2015  left leg/   12-09-2015 back    Hypertension    Microhematuria    Migraines    Nephrolithiasis    left side non-obstructive    Right ureteral stone    Urgency of urination    Wears contact lenses     SURGICAL HISTORY: Past Surgical History:  Procedure Laterality Date   ABDOMINAL HYSTERECTOMY     CARPAL TUNNEL RELEASE Right 01-05- 2015   COLONOSCOPY N/A 08/27/2015   Procedure: COLONOSCOPY;  Surgeon: Danie Binder, MD;  Location: [facility];  Service: Endoscopy;  Laterality: N/A;  0830   CYST EXCISION  2012     right hand   CYSTOSCOPY W/ URETERAL STENT PLACEMENT Right 12/22/2015   Procedure: CYSTOSCOPY WITH RETROGRADE PYELOGRAM/URETERAL STENT PLACEMENT;  Surgeon: Nickie Retort, MD;  Location: [facility];  Service: Urology;  Laterality: Right;   CYSTOSCOPY WITH RETROGRADE PYELOGRAM, URETEROSCOPY AND STENT PLACEMENT Right 01/05/2016   Procedure: CYSTOSCOPY WITH RETROGRADE PYELOGRAM, URETEROSCOPY AND STENT REPLACEMENT;  Surgeon: Nickie Retort, MD;  Location: [facility];  Service: Urology;  Laterality: Right;   DILATION AND CURETTAGE OF UTERUS  03-12-2009   w/  Suction   double balloon enteroscopy     [facility]. Arsenio Loader [facility]: no erosions, no evidence of Crohn's disease, path without Crohn's.    EGD with push enteroscopy  02-12-2010    patent distal peptic stricture with diffuse antral erythema, normal D1 and D2    LAPAROSCOPIC ASSISTED VAGINAL HYSTERECTOMY  10-26-2014   w/  Bilateral Salpingoophorectomy  STONE EXTRACTION WITH BASKET Right 01/05/2016   Procedure: STONE EXTRACTION WITH BASKET;  Surgeon: Nickie Retort, MD;  Location: [facility];  Service: Urology;  Laterality: Right;   TONSILLECTOMY  1998  approx    SOCIAL HISTORY: Social History   Socioeconomic History   Marital status: Single    Spouse name: Not on file   Number of children: Not on file   Years of education: Not on file   Highest education level: Not on file   Occupational History   Not on file  Tobacco Use   Smoking status: Never   Smokeless tobacco: Never  Vaping Use   Vaping Use: Never used  Substance and Sexual Activity   Alcohol use: Yes    Comment: occassionally   Drug use: No   Sexual activity: Not on file  Other Topics Concern   Not on file  Social History Narrative   Not on file   Social Determinants of Health   Financial Resource Strain: Low Risk    Difficulty of Paying Living Expenses: Not hard at all  Food Insecurity: No Food Insecurity   Worried About Estate manager/land agent of Food in the Last Year: Never true   [place] in the Last Year: Never true  Transportation Needs: No Transportation Needs   Lack of Transportation (Medical): No   Lack of Transportation (Non-Medical): No  Physical Activity: Inactive   Days of Exercise per Week: 0 days   Minutes of Exercise per Session: 0 min  Stress: Stress Concern Present   Feeling of Stress : Very much  Social Connections: Socially Isolated   Frequency of Communication with Friends and Family: More than three times a week   Frequency of Social Gatherings with Friends and Family: More than three times a week   Attends Religious Services: Never   Marine scientist or Organizations: No   Attends Music therapist: Never   Marital Status: Never married  Human resources officer Violence: Not At Risk   Fear of Current or Ex-Partner: No   Emotionally Abused: No   Physically Abused: No   Sexually Abused: No    FAMILY HISTORY: Family History  Problem Relation Age of Onset   Diabetes Mother    Colon cancer Neg Hx     ALLERGIES:  is allergic to topamax [topiramate] and sulfa antibiotics.  MEDICATIONS:  Current Outpatient Medications  Medication Sig Dispense Refill   acetaminophen (TYLENOL) 500 MG tablet Take 500 mg by mouth every 6 (six) hours as needed. As needed     busPIRone (BUSPAR) 5 MG tablet TAKE 1 TABLET BY MOUTH THREE TIMES DAILY 45 tablet 2    hydrochlorothiazide (HYDRODIURIL) 25 MG tablet Take 1 tablet by mouth once daily 90 tablet 0   losartan (COZAAR) 100 MG tablet Take 1 tablet (100 mg total) by mouth daily. Needs labs and appt. 90 tablet 1   meclizine (ANTIVERT) 25 MG tablet Take 25 mg by mouth 3 (three) times daily as needed for dizziness.     meloxicam (MOBIC) 7.5 MG tablet TAKE 1 TABLET BY MOUTH ONCE DAILY AS NEEDED MIGRAINE 30 tablet 2   metFORMIN (GLUCOPHAGE) 850 MG tablet Take 1 tablet (850 mg total) by mouth 2 (two) times daily with a meal. 180 tablet 1   omeprazole (PRILOSEC) 20 MG capsule 1 PO 30 mins prior to breakfast and supper 60 capsule 11   OVER THE COUNTER MEDICATION Renew Women's probiotic   One daily  OVER THE COUNTER MEDICATION Vit d 3 2,000 daily b12 2,000 daily Cinnamon 500 daily Green tea extract bid chrominum once daily     PARoxetine (PAXIL) 40 MG tablet TAKE 1 TABLET BY MOUTH ONCE DAILY IN THE MORNING 30 tablet 1   rizatriptan (MAXALT-MLT) 10 MG disintegrating tablet Take 1 tablet (10 mg total) by mouth as needed for migraine. May repeat in 2 hours if needed. Max 2 per 24 hours 10 tablet 1   traZODone (DESYREL) 50 MG tablet TAKE 1 TO 2 TABLETS BY MOUTH EVERY DAY AT BEDTIME AS NEEDED FOR INSOMNIA 60 tablet 2   valACYclovir (VALTREX) 500 MG tablet Take 500 mg by mouth 2 (two) times daily.     vitamin B-12 (CYANOCOBALAMIN) 1000 MCG tablet Take 1,000 mcg by mouth daily.     No current facility-administered medications for this visit.    REVIEW OF SYSTEMS:   Review of Systems  Constitutional:  Positive for fatigue (depleted). Negative for appetite change (60%).  HENT:   Positive for trouble swallowing (pills and solids).   Respiratory:  Positive for cough.   Cardiovascular:  Positive for chest pain (occasional).  Gastrointestinal:  Positive for constipation, nausea and vomiting (occasional).  Musculoskeletal:  Positive for arthralgias (5/10 L hip) and back pain (5/10 lower back).  Neurological:   Positive for dizziness, headaches and numbness (hands).  Psychiatric/Behavioral:  Positive for decreased concentration.   All other systems reviewed and are negative.   PHYSICAL EXAMINATION: ECOG PERFORMANCE STATUS: 0 - Asymptomatic  Vitals:   06/30/21 1515  BP: 130/87  Pulse: 74  Resp: 18  Temp: (!) 97 F (36.1 C)  SpO2: 97%   Filed Weights   06/30/21 1515  Weight: 177 lb 6.4 oz (80.5 kg)   Physical Exam Vitals reviewed.  Constitutional:      Appearance: Normal appearance.  Cardiovascular:     Rate and Rhythm: Normal rate and regular rhythm.     Pulses: Normal pulses.     Heart sounds: Normal heart sounds.  Pulmonary:     Effort: Pulmonary effort is normal.     Breath sounds: Normal breath sounds.  Abdominal:     Palpations: Abdomen is soft. There is no hepatomegaly, splenomegaly or mass.     Tenderness: There is no abdominal tenderness.  Musculoskeletal:     Comments: 10 cm mass on L lateral buttock  Lymphadenopathy:     Upper Body:     Right upper body: No supraclavicular, axillary or pectoral adenopathy.     Left upper body: No supraclavicular, axillary or pectoral adenopathy.     Lower Body: No right inguinal adenopathy. No left inguinal adenopathy.  Skin:    Comments: R paraspinal medial scar form melanoma removal L medial calf scar from melanoma removal  Neurological:     General: No focal deficit present.     Mental Status: She is alert and oriented to person, place, and time.  Psychiatric:        Mood and Affect: Mood normal.        Behavior: Behavior normal.     LABORATORY DATA:  I have reviewed the data as listed CBC Latest Ref Rng & Units 01/26/2021 07/21/2020 07/13/2017  WBC 3.4 - 10.8 x10E3/uL 8.7 7.6 6.2  Hemoglobin 11.1 - 15.9 g/dL 14.2 13.4 12.7  Hematocrit 34.0 - 46.6 % 41.7 38.5 36.3  Platelets 150 - 450 x10E3/uL 320 255 252   CMP Latest Ref Rng & Units 01/26/2021 08/27/2020 07/21/2020  Glucose 65 -  99 mg/dL 105(H) 169(H) 269(H)  BUN 6 - 24  mg/dL _0 Creatinine 0.57 - 1.00 mg/dL 0.56(L) 0.61 0.61  Sodium 134 - 144 mmol/L 143 140 132(L)  Potassium 3.5 - 5.2 mmol/L 4.0 3.8 3.3(L)  Chloride 96 - 106 mmol/L 102 97 97(L)  CO2 20 - 29 mmol/L _1 Calcium 8.7 - 10.2 mg/dL 9.5 9.8 9.0  Total Protein 6.0 - 8.5 g/dL 7.1 - -  Total Bilirubin 0.0 - 1.2 mg/dL 0.3 - -  Alkaline Phos 44 - 121 IU/L 97 - -  AST 0 - 40 IU/L 21 - -  ALT 0 - 32 IU/L 28 - -    RADIOGRAPHIC STUDIES: I have personally reviewed the radiological images as listed and agreed with the findings in the report. MR Pelvis W Wo Contrast  Result Date: 06/26/2021 CLINICAL DATA:  Hip pain for 7 months. Indeterminate right sacral lesion seen on recent MRI of the femur. EXAM: MRI PELVIS WITHOUT AND WITH CONTRAST TECHNIQUE: Multiplanar multisequence MR imaging of the pelvis was performed both before and after administration of intravenous contrast. CONTRAST:  7.14m GADAVIST GADOBUTROL 1 MMOL/ML IV SOLN COMPARISON:  05/14/2021 FINDINGS: Bones/Joint/Cartilage No fracture or dislocation. Normal alignment. No joint effusion. 10 mm T1 and T2 hyperintense enhancing bone lesion in the right sacrum with a smaller adjacent 2 mm bone lesion. 14 mm T1 and T2 hyperintense enhancing bone lesion in the right posterior ilium. 7 mm enhancing bone lesion left posterior acetabulum and a second 10 mm bone lesion in the left posterior acetabular roof. 9.7 mm enhancing bone lesion in the left side of the S1 vertebral body. Partially lysed T2 hyperintense bone lesion in the L3 vertebral body. 7 mm bone lesion in the left L4 pedicle. Incidental note made of a bone island in the right posterior acetabulum. Ligaments, Muscles and Tendons Muscles are normal. Soft tissue No fluid collection or hematoma.  No soft tissue mass. IMPRESSION: 1. Multiple bone lesions in the pelvis and lower lumbar spine most consistent with metastatic disease given the patient's history of melanoma and signal characteristics.  Bone lesion seen in the left lesser trochanter and posterior aspect of the left intertrochanteric region on the recent MRI of the femur dated 05/14/2021 are also most consistent with metastatic disease. Electronically Signed   By: HKathreen DevoidM.D.   On: 06/26/2021 10:47    ASSESSMENT:  1.  Multiple bone lesions: - Presentation with left hip pain since February 2022. - Left hip mass noticed in May 2022, tender and painful. - Low back pain started since 25 May 2021. - Ultrasound of the left upper thigh soft tissue on 04/08/2021 was negative with differential including large occult subcutaneous lipoma. - MRI of the left femur with and without contrast on 05/14/2021-no soft tissue mass, fluid collection or hematoma at the site of clinical concern in the left lateral thigh.  Indeterminate bone lesion in the S1 vertebral body, right sacrum and left posterior intertrochanteric region. - MRI of the pelvis on 06/24/2021 showed multiple bone lesions in the pelvis, lower lumbar spine consistent with metastatic disease. 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 [facility]Juel [facility]ffice. - She recently had a basal cell carcinoma of the nose and left temporal region resected.  2.  Social/family history: - She lives at home with her son.  Mom lives close by. 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  She is non-smoker. - Maternal aunt had throat cancer.  Another maternal aunt had bone cancer.  Another maternal aunt had pancreatic cancer.  Niece had ovarian cancer.  Paternal uncle had leukemia.   PLAN:  1.  Bone lesions suspicious for metastatic cancer: - We have reviewed images of the MRI. - Given her history of melanoma resected x2, highly suspicious for metastatic melanoma. - We will obtain further imaging with PET scan.  We will also obtain brain MRI with and without contrast because of her headaches and  left eye vision changes. - We will also obtain biopsy of the soft tissue mass if it lights up on the PET scan.  We will consider NGS testing. - We will check LDH level along with routine labs.  We will consider doing multiple myeloma work-up at next visit. - We will obtain records from Dr. Juel Burrow office including pathology reports of the melanoma resected x2.  2.  Lower back and left hip pain: - She reports that Tylenol and NSAIDs are not helping completely. - We will start her on tramadol 50 mg every 6 hours as needed.   All questions were answered. The patient knows to call the clinic with any problems, questions or concerns.   Derek Jack, MD, 06/30/21 4:06 PM  [place] [phone]   I, Thana Ates, am acting as a scribe for Dr. Derek Jack.  I, Derek Jack MD, have reviewed the above documentation for accuracy and completeness, and I agree with the above.

## 2021-06-30 NOTE — Patient Instructions (Addendum)
[place] at [facility] Discharge Instructions  You were seen and examined today by Dr. Delton Coombes. Dr. Delton Coombes is a medical oncologist, meaning he specializes in the management of cancer diagnoses. Dr. Delton Coombes discussed your past medical history, family history of cancers and the events that led to you being here today.  Dr. Delton Coombes reviewed your recent MRI results. There are several lesions on your bone that are concerning for cancer.   Dr. Delton Coombes recommends a PET scan. That is a specialized CT scan that illuminates where there is cancer present in your body. Dr. Delton Coombes has also recommended a brain MRI to rule out anything concerning for cancer. Dr. Delton Coombes has also recommended a biopsy to identify exactly what the lesions are.  Follow-up with Dr. Delton Coombes as scheduled.   Thank you for choosing [place] at [facility] to provide your oncology and hematology care.  To afford each patient quality time with our provider, please arrive at least 15 minutes before your scheduled appointment time.   If you have a lab appointment with the [facility] please come in thru the Main Entrance and check in at the main information desk.  You need to re-schedule your appointment should you arrive 10 or more minutes late.  We strive to give you quality time with our providers, and arriving late affects you and other patients whose appointments are after yours.  Also, if you no show three or more times for appointments you may be dismissed from the clinic at the providers discretion.     Again, thank you for choosing [facility].  Our hope is that these requests will decrease the amount of time that you wait before being seen by our physicians.       _____________________________________________________________  Should you have questions after your visit to [facility], please contact our office at [phone]  and follow the prompts.  Our office hours are 8:00 a.m. and 4:30 p.m. Monday - Friday.  Please note that voicemails left after 4:00 p.m. may not be returned until the following business day.  We are closed weekends and major holidays.  You do have access to a nurse 24-7, just call the main number to the clinic [phone] and do not press any options, hold on the line and a nurse will answer the phone.    For prescription refill requests, have your pharmacy contact our office and allow 72 hours.    Due to Covid, you will need to wear a mask upon entering the hospital. If you do not have a mask, a mask will be given to you at the Main Entrance upon arrival. For doctor visits, patients may have 1 support person age 23 or older with them. For treatment visits, patients can not have anyone with them due to social distancing guidelines and our immunocompromised population.

## 2021-07-07 ENCOUNTER — Ambulatory Visit: Payer: Medicaid Other | Admitting: General Surgery

## 2021-07-08 ENCOUNTER — Ambulatory Visit (HOSPITAL_COMMUNITY)
Admission: RE | Admit: 2021-07-08 | Discharge: 2021-07-08 | Disposition: A | Discharge status: Home / Self Care | Payer: Medicaid Other | Source: Ambulatory Visit | Attending: Hematology | Admitting: Hematology

## 2021-07-08 ENCOUNTER — Other Ambulatory Visit: Payer: Self-pay

## 2021-07-08 DIAGNOSIS — C7951 Secondary malignant neoplasm of bone: Secondary | ICD-10-CM | POA: Diagnosis not present

## 2021-07-08 MED ORDER — GADOBUTROL 1 MMOL/ML IV SOLN
7.5000 mL/kg | Freq: Once | INTRAVENOUS | Status: AC | PRN
  Administered 2021-07-08: 604 mL via INTRAVENOUS

## 2021-07-09 ENCOUNTER — Ambulatory Visit (HOSPITAL_COMMUNITY)
Admission: RE | Admit: 2021-07-09 | Discharge: 2021-07-09 | Disposition: A | Discharge status: Home / Self Care | Payer: Medicaid Other | Source: Ambulatory Visit | Attending: Hematology | Admitting: Hematology

## 2021-07-09 DIAGNOSIS — C7951 Secondary malignant neoplasm of bone: Secondary | ICD-10-CM | POA: Insufficient documentation

## 2021-07-09 MED ORDER — FLUDEOXYGLUCOSE F - 18 (FDG) INJECTION
8.7600 | Freq: Once | INTRAVENOUS | Status: AC | PRN
  Administered 2021-07-09: 8.76 via INTRAVENOUS

## 2021-07-10 ENCOUNTER — Other Ambulatory Visit (HOSPITAL_COMMUNITY): Payer: Self-pay | Admitting: Hematology

## 2021-07-10 DIAGNOSIS — C7951 Secondary malignant neoplasm of bone: Secondary | ICD-10-CM

## 2021-07-12 NOTE — Progress Notes (Signed)
[place], [place] [address]   CLINIC:  Medical Oncology/Hematology  PCP:  Erven Colla, DO [address] / [place] [address] [phone]   REASON FOR VISIT:  Follow-up for multiple bone lesions  PRIOR THERAPY: none  NGS Results: not done  CURRENT THERAPY: under work-up  BRIEF ONCOLOGIC HISTORY:  Oncology History   No history exists.    CANCER STAGING: Cancer Staging Metastatic cancer to bone [facility]) Staging form: Bone - Appendicular Skeleton, Trunk, Skull, and Facial Bones, AJCC 8th Edition - Clinical stage from 06/30/2021: Stage IVB (cTX, cNX, pM1b) - Unsigned   INTERVAL HISTORY:  [name], a 55 y.o. female, returns for routine follow-up of her multiple bone lesions. Astaria was last seen on 06/30/2021.   Today she reports feeling well. She reports pain on the left side of her head, and she reports in her left hip, bilateral shoulders, and bilateral lower back. She has been taking tramadol every 6 hours. She is scheduled for a biopsy on 07/21/21. She reports nausea and denies vomiting.   REVIEW OF SYSTEMS:  Review of Systems  Constitutional:  Positive for fatigue (30%). Negative for appetite change (60%).  HENT:   Positive for trouble swallowing (pills).   Cardiovascular:  Positive for chest pain.  Gastrointestinal:  Positive for diarrhea and nausea. Negative for vomiting.  Musculoskeletal:  Positive for arthralgias (6/10 hips and shoulders) and back pain (6/10 lower black).  Neurological:  Positive for headaches (L side) and numbness (tingling hands and feet).  Psychiatric/Behavioral:  Positive for depression and sleep disturbance. The patient is nervous/anxious.   All other systems reviewed and are negative.  PAST MEDICAL/SURGICAL HISTORY:  Past Medical History:  Diagnosis Date   Complication of anesthesia    woke up during endoscopy and colonoscopy   Depression    Hemorrhoids    History of esophageal  stricture    2011  peptic w/  dilatation   History of gastritis    2011   History of melanoma excision    2015  left leg/   12-09-2015 back    Hypertension    Microhematuria    Migraines    Nephrolithiasis    left side non-obstructive    Right ureteral stone    Urgency of urination    Wears contact lenses    Past Surgical History:  Procedure Laterality Date   ABDOMINAL HYSTERECTOMY     CARPAL TUNNEL RELEASE Right 01-05- 2015   COLONOSCOPY N/A 08/27/2015   Procedure: COLONOSCOPY;  Surgeon: Danie Binder, MD;  Location: [facility];  Service: Endoscopy;  Laterality: N/A;  0830   CYST EXCISION  2012     right hand   CYSTOSCOPY W/ URETERAL STENT PLACEMENT Right 12/22/2015   Procedure: CYSTOSCOPY WITH RETROGRADE PYELOGRAM/URETERAL STENT PLACEMENT;  Surgeon: Nickie Retort, MD;  Location: [facility];  Service: Urology;  Laterality: Right;   CYSTOSCOPY WITH RETROGRADE PYELOGRAM, URETEROSCOPY AND STENT PLACEMENT Right 01/05/2016   Procedure: CYSTOSCOPY WITH RETROGRADE PYELOGRAM, URETEROSCOPY AND STENT REPLACEMENT;  Surgeon: Nickie Retort, MD;  Location: [facility];  Service: Urology;  Laterality: Right;   DILATION AND CURETTAGE OF UTERUS  03-12-2009   w/  Suction   double balloon enteroscopy     Dr. Arsenio Loader at [facility]: no erosions, no evidence of Crohn's disease, path without Crohn's.    EGD with push enteroscopy  02-12-2010    patent distal peptic stricture with diffuse antral erythema,  normal D1 and D2    LAPAROSCOPIC ASSISTED VAGINAL HYSTERECTOMY  10-26-2014   w/  Bilateral Salpingoophorectomy   STONE EXTRACTION WITH BASKET Right 01/05/2016   Procedure: STONE EXTRACTION WITH BASKET;  Surgeon: Nickie Retort, MD;  Location: [facility];  Service: Urology;  Laterality: Right;   TONSILLECTOMY  1998  approx    SOCIAL HISTORY:  Social History   Socioeconomic History   Marital status: Single    Spouse name: Not on file   Number of children:  Not on file   Years of education: Not on file   Highest education level: Not on file  Occupational History   Not on file  Tobacco Use   Smoking status: Never   Smokeless tobacco: Never  Vaping Use   Vaping Use: Never used  Substance and Sexual Activity   Alcohol use: Yes    Comment: occassionally   Drug use: No   Sexual activity: Not on file  Other Topics Concern   Not on file  Social History Narrative   Not on file   Social Determinants of Health   Financial Resource Strain: Low Risk    Difficulty of Paying Living Expenses: Not hard at all  Food Insecurity: No Food Insecurity   Worried About Estate manager/land agent of Food in the Last Year: Never true   [place] in the Last Year: Never true  Transportation Needs: No Transportation Needs   Lack of Transportation (Medical): No   Lack of Transportation (Non-Medical): No  Physical Activity: Inactive   Days of Exercise per Week: 0 days   Minutes of Exercise per Session: 0 min  Stress: Stress Concern Present   Feeling of Stress : Very much  Social Connections: Socially Isolated   Frequency of Communication with Friends and Family: More than three times a week   Frequency of Social Gatherings with Friends and Family: More than three times a week   Attends Religious Services: Never   Marine scientist or Organizations: No   Attends Music therapist: Never   Marital Status: Never married  Human resources officer Violence: Not At Risk   Fear of Current or Ex-Partner: No   Emotionally Abused: No   Physically Abused: No   Sexually Abused: No    FAMILY HISTORY:  Family History  Problem Relation Age of Onset   Diabetes Mother    Colon cancer Neg Hx     CURRENT MEDICATIONS:  Current Outpatient Medications  Medication Sig Dispense Refill   acetaminophen (TYLENOL) 500 MG tablet Take 500 mg by mouth every 6 (six) hours as needed. As needed     busPIRone (BUSPAR) 5 MG tablet TAKE 1 TABLET BY MOUTH THREE TIMES DAILY 45  tablet 2   hydrochlorothiazide (HYDRODIURIL) 25 MG tablet Take 1 tablet by mouth once daily 90 tablet 0   losartan (COZAAR) 100 MG tablet Take 1 tablet (100 mg total) by mouth daily. Needs labs and appt. 90 tablet 1   meclizine (ANTIVERT) 25 MG tablet Take 25 mg by mouth 3 (three) times daily as needed for dizziness.     meloxicam (MOBIC) 7.5 MG tablet TAKE 1 TABLET BY MOUTH ONCE DAILY AS NEEDED MIGRAINE 30 tablet 2   metFORMIN (GLUCOPHAGE) 850 MG tablet Take 1 tablet (850 mg total) by mouth 2 (two) times daily with a meal. 180 tablet 1   omeprazole (PRILOSEC) 20 MG capsule 1 PO 30 mins prior to breakfast and supper 60 capsule 11  OVER THE COUNTER MEDICATION Renew Women's probiotic   One daily     OVER THE COUNTER MEDICATION Vit d 3 2,000 daily b12 2,000 daily Cinnamon 500 daily Green tea extract bid chrominum once daily     PARoxetine (PAXIL) 40 MG tablet TAKE 1 TABLET BY MOUTH ONCE DAILY IN THE MORNING 30 tablet 1   rizatriptan (MAXALT-MLT) 10 MG disintegrating tablet Take 1 tablet (10 mg total) by mouth as needed for migraine. May repeat in 2 hours if needed. Max 2 per 24 hours 10 tablet 1   traMADol (ULTRAM) 50 MG tablet Take 1 tablet (50 mg total) by mouth every 6 (six) hours as needed. 30 tablet 0   traZODone (DESYREL) 50 MG tablet TAKE 1 TO 2 TABLETS BY MOUTH EVERY DAY AT BEDTIME AS NEEDED FOR INSOMNIA 60 tablet 2   valACYclovir (VALTREX) 500 MG tablet Take 500 mg by mouth 2 (two) times daily.     vitamin B-12 (CYANOCOBALAMIN) 1000 MCG tablet Take 1,000 mcg by mouth daily.     No current facility-administered medications for this visit.    ALLERGIES:  Allergies  Allergen Reactions   Topamax [Topiramate] Other (See Comments)    Developed kidney stones   Sulfa Antibiotics Other (See Comments)    Burning sensation, flushing to skin    PHYSICAL EXAM:  Performance status (ECOG): 0 - Asymptomatic  There were no vitals filed for this visit. Wt Readings from Last 3 Encounters:   06/30/21 177 lb 6.4 oz (80.5 kg)  06/11/21 175 lb (79.4 kg)  04/30/21 173 lb (78.5 kg)   Physical Exam Vitals reviewed.  Constitutional:      Appearance: Normal appearance.  Cardiovascular:     Rate and Rhythm: Normal rate and regular rhythm.     Pulses: Normal pulses.     Heart sounds: Normal heart sounds.  Pulmonary:     Effort: Pulmonary effort is normal.     Breath sounds: Normal breath sounds.  Neurological:     General: No focal deficit present.     Mental Status: She is alert and oriented to person, place, and time.  Psychiatric:        Mood and Affect: Mood normal.        Behavior: Behavior normal.     LABORATORY DATA:  I have reviewed the labs as listed.  CBC Latest Ref Rng & Units 06/30/2021 01/26/2021 07/21/2020  WBC 4.0 - 10.5 K/uL 7.0 8.7 7.6  Hemoglobin 12.0 - 15.0 g/dL 13.0 14.2 13.4  Hematocrit 36.0 - 46.0 % 38.0 41.7 38.5  Platelets 150 - 400 K/uL 265 320 255   CMP Latest Ref Rng & Units 06/30/2021 01/26/2021 08/27/2020  Glucose 70 - 99 mg/dL 82 105(H) 169(H)  BUN 6 - 20 mg/dL '12 11 11  '$ Creatinine 0.44 - 1.00 mg/dL 0.59 0.56(L) 0.61  Sodium 135 - 145 mmol/L 139 143 140  Potassium 3.5 - 5.1 mmol/L 3.4(L) 4.0 3.8  Chloride 98 - 111 mmol/L 107 102 97  CO2 22 - 32 mmol/L '24 24 27  '$ Calcium 8.9 - 10.3 mg/dL 9.0 9.5 9.8  Total Protein 6.5 - 8.1 g/dL 6.7 7.1 -  Total Bilirubin 0.3 - 1.2 mg/dL 0.3 0.3 -  Alkaline Phos 38 - 126 U/L 73 97 -  AST 15 - 41 U/L 20 21 -  ALT 0 - 44 U/L 24 28 -    DIAGNOSTIC IMAGING:  I have independently reviewed the scans and discussed with the patient. MR Brain W Wo  Contrast  Result Date: 07/08/2021 CLINICAL DATA:  History of melanoma. Rule out metastatic disease. Staging. EXAM: MRI HEAD WITHOUT AND WITH CONTRAST TECHNIQUE: Multiplanar, multiecho pulse sequences of the brain and surrounding structures were obtained without and with intravenous contrast. CONTRAST:  6 mL GADAVIST GADOBUTROL 1 MMOL/ML IV SOLN COMPARISON:  None. FINDINGS:  Brain: No acute infarction, hemorrhage, hydrocephalus, extra-axial collection or mass lesion. Normal enhancement of the brain. No metastatic deposits in the brain. Vascular: Normal arterial flow voids. Skull and upper cervical spine: 2 cm enhancing lesion left frontal bone, highly suspicious for metastatic disease. No other skeletal lesion identified. Sinuses/Orbits: Negative Other: None IMPRESSION: 2 cm enhancing lesion left frontal bone highly suspicious for metastatic disease Negative for metastatic disease to the brain. Electronically Signed   By: Franchot Gallo M.D.   On: 07/08/2021 17:11   MR Pelvis W Wo Contrast  Result Date: 06/26/2021 CLINICAL DATA:  Hip pain for 7 months. Indeterminate right sacral lesion seen on recent MRI of the femur. EXAM: MRI PELVIS WITHOUT AND WITH CONTRAST TECHNIQUE: Multiplanar multisequence MR imaging of the pelvis was performed both before and after administration of intravenous contrast. CONTRAST:  7.46m GADAVIST GADOBUTROL 1 MMOL/ML IV SOLN COMPARISON:  05/14/2021 FINDINGS: Bones/Joint/Cartilage No fracture or dislocation. Normal alignment. No joint effusion. 10 mm T1 and T2 hyperintense enhancing bone lesion in the right sacrum with a smaller adjacent 2 mm bone lesion. 14 mm T1 and T2 hyperintense enhancing bone lesion in the right posterior ilium. 7 mm enhancing bone lesion left posterior acetabulum and a second 10 mm bone lesion in the left posterior acetabular roof. 9.7 mm enhancing bone lesion in the left side of the S1 vertebral body. Partially lysed T2 hyperintense bone lesion in the L3 vertebral body. 7 mm bone lesion in the left L4 pedicle. Incidental note made of a bone island in the right posterior acetabulum. Ligaments, Muscles and Tendons Muscles are normal. Soft tissue No fluid collection or hematoma.  No soft tissue mass. IMPRESSION: 1. Multiple bone lesions in the pelvis and lower lumbar spine most consistent with metastatic disease given the patient's history  of melanoma and signal characteristics. Bone lesion seen in the left lesser trochanter and posterior aspect of the left intertrochanteric region on the recent MRI of the femur dated 05/14/2021 are also most consistent with metastatic disease. Electronically Signed   By: HKathreen DevoidM.D.   On: 06/26/2021 10:47   NM PET Image Initial (PI) Skull Base To Thigh (F-18 FDG)  Result Date: 07/10/2021 CLINICAL DATA:  Subsequent encounter. treatment strategy for malignant melanoma. Indeterminate bone lesions within the pelvis and the calvarium on MRI. EXAM: NUCLEAR MEDICINE PET SKULL BASE TO THIGH TECHNIQUE: 8.8 mCi F-18 FDG was injected intravenously. Full-ring PET imaging was performed from the skull base to thigh after the radiotracer. CT data was obtained and used for attenuation correction and anatomic localization. Of note a skull base to thigh PET-CT was performed instead of whole-body PET-CT which is typical for melanoma patients. Fasting blood glucose: 146 mg/dl COMPARISON:  Pelvic MRI 06/24/2021 FINDINGS: Mediastinal blood pool activity: SUV max 2.6 Liver activity: SUV max NA NECK: No hypermetabolic lymph nodes in the neck. Incidental CT findings: none CHEST: Bilateral small relative high density mediastinal lymph nodes with associated moderate metabolic activity. For example RIGHT lower paratracheal node measuring 10 mm with SUV max equal 4.5. LEFT lower paratracheal node measuring 10 mm with SUV max equal 5.5. Symmetric moderate activity in hilar lymph nodes which are difficult  to assess on noncontrast CT. No suspicious pulmonary nodules. Incidental CT findings: none ABDOMEN/PELVIS: No abnormal hypermetabolic activity within the liver, pancreas, adrenal glands, or spleen. No hypermetabolic lymph nodes in the abdomen or pelvis. Incidental CT findings: none SKELETON: Lesion of concern in the posterior RIGHT iliac bone on comparison MRI does have focal hypermetabolic activity SUV max equal 7.1 (image 202). Subtle  sclerotic lesion at this level. Second lesion in the adjacent RIGHT sacral ala also corresponds to MRI signal abnormality and is hypermetabolic with SUV max equal 4.1. No clear CT lesion. Within the superior aspect of the RIGHT scapular spine is a focus of metabolic activity with SUV max equal 4.6. Similar but less intense activity in the LEFT scapular spine. Finally, within the LEFT frontal bone partially imaged lytic lesion measures 2.1 cm (image 162 series 3) and has associated subtle metabolic activity. Incidental CT findings: No abnormal metabolic activity associated with the LEFT hip. IMPRESSION: 1. Hypermetabolic lesions associated with the abnormal MRI findings within the RIGHT pelvis and anterior LEFT frontal bone are concerning for malignancy/metastatic disease but nonspecific. Unusual pattern for metastatic melanoma. Consider tissue sampling of the iliac bone lesions. 2. Hypermetabolic mediastinal lymph nodes are indeterminate but concerning for malignancy versus granulomatous disease. 3. Of note, skull base to thigh imaging performed rather than typical whole-body melanoma protocol. Electronically Signed   By: Suzy Bouchard M.D.   On: 07/10/2021 10:47     ASSESSMENT:  1.  Multiple bone lesions: - Presentation with left hip pain since February 2022. - Left hip mass noticed in May 2022, tender and painful. - Low back pain started since 25 May 2021. - Ultrasound of the left upper thigh soft tissue on 04/08/2021 was negative with differential including large occult subcutaneous lipoma. - MRI of the left femur with and without contrast on 05/14/2021-no soft tissue mass, fluid collection or hematoma at the site of clinical concern in the left lateral thigh.  Indeterminate bone lesion in the S1 vertebral body, right sacrum and left posterior intertrochanteric region. - MRI of the pelvis on 06/24/2021 showed multiple bone lesions in the pelvis, lower lumbar spine consistent with metastatic disease. -  Patient reports she had melanoma of the right upper back resected about 15 years ago and left calf melanoma removed about 13 years ago at Dr. Juel Burrow office. - She recently had a basal cell carcinoma of the nose and left temporal region resected.  2.  Social/family history: - She lives at home with her son.  Mom lives close by. 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  She is non-smoker. - Maternal aunt had throat cancer.  Another maternal aunt had bone cancer.  Another maternal aunt had pancreatic cancer.  Niece had ovarian cancer.  Paternal uncle had leukemia.   PLAN:  1.  Bone lesions and mediastinal lymphadenopathy: - We have reviewed images of the PET scan from 07/10/2021.  Hypermetabolic lesions in the right iliac bone, right sacral ala and left frontal area.  Right lower paratracheal lymph node 10 mm, SUV 4.5.  Left lower paratracheal lymph node 10 mm, SUV 5.5.  Symmetric moderate activity in hilar lymph nodes.  Left thigh lesion is not hypermetabolic indicating benign nature. - Reviewed labs from 06/30/2021 which showed normal LDH, LFTs and creatinine.  CBC was normal. - Recommend biopsy of the right iliac bone lesion. - Continue efforts to obtain pathology reports from Dr. Juel Burrow office of previously resected melanoma. - RTC 2 weeks for follow-up.  2.  Lower back and left hip pain: - Continue tramadol 50 mg every 6 hours as needed.  We have given refill.  3.  Left frontal headaches: - Reviewed MRI of the brain with and without contrast from 07/08/2021 which showed 2 cm enhancing lesion in the left frontal bone highly suspicious for metastatic disease.  No intracranial metastatic disease.  4.  Intermittent nausea: - She reported intermittent nausea but no vomiting. - We will start her on Compazine 10 mg every 6 hours as needed.   Orders placed this encounter:  No orders of the defined types were placed in this encounter.    Derek Jack, MD [place] [phone]   I, Thana Ates, am acting as a scribe for Dr. Derek Jack.  I, Derek Jack MD, have reviewed the above documentation for accuracy and completeness, and I agree with the above.

## 2021-07-13 ENCOUNTER — Other Ambulatory Visit (HOSPITAL_COMMUNITY): Payer: Self-pay | Admitting: *Deleted

## 2021-07-13 ENCOUNTER — Other Ambulatory Visit: Payer: Self-pay

## 2021-07-13 ENCOUNTER — Inpatient Hospital Stay (HOSPITAL_BASED_OUTPATIENT_CLINIC_OR_DEPARTMENT_OTHER): Payer: Medicaid Other | Admitting: Hematology

## 2021-07-13 VITALS — BP 126/79 | HR 74 | Temp 98.8°F | Resp 18 | Wt 177.2 lb

## 2021-07-13 DIAGNOSIS — M899 Disorder of bone, unspecified: Secondary | ICD-10-CM | POA: Diagnosis not present

## 2021-07-13 DIAGNOSIS — C7951 Secondary malignant neoplasm of bone: Secondary | ICD-10-CM

## 2021-07-13 MED ORDER — TRAMADOL HCL 50 MG PO TABS: 50.0000 mg | ORAL_TABLET | Freq: Four times a day (QID) | ORAL | 0 refills | Status: DC | PRN

## 2021-07-13 MED ORDER — PROCHLORPERAZINE MALEATE 10 MG PO TABS: 10.0000 mg | ORAL_TABLET | Freq: Four times a day (QID) | ORAL | 2 refills | Status: DC | PRN

## 2021-07-13 NOTE — Patient Instructions (Addendum)
[facility] at [facility] Discharge Instructions  You were seen today by Dr. Katragadda. He went over your recent results and scans. Dr. Katragadda will see you back in 2 weeks for labs and follow up.   Thank you for choosing [facility] at [facility] to provide your oncology and hematology care.  To afford each patient quality time with our provider, please arrive at least 15 minutes before your scheduled appointment time.   If you have a lab appointment with the Cancer Center please come in thru the Main Entrance and check in at the main information desk  You need to re-schedule your appointment should you arrive 10 or more minutes late.  We strive to give you quality time with our providers, and arriving late affects you and other patients whose appointments are after yours.  Also, if you no show three or more times for appointments you may be dismissed from the clinic at the providers discretion.     Again, thank you for choosing [facility].  Our hope is that these requests will decrease the amount of time that you wait before being seen by our physicians.       _____________________________________________________________  Should you have questions after your visit to  Cancer Center, please contact our office at [phone] between the hours of 8:00 a.m. and 4:30 p.m.  Voicemails left after 4:00 p.m. will not be returned until the following business day.  For prescription refill requests, have your pharmacy contact our office and allow 72 hours.    Cancer Center Support Programs:   > Cancer Support Group  2nd Tuesday of the month 1pm-2pm, Journey Room    

## 2021-07-14 ENCOUNTER — Encounter (HOSPITAL_COMMUNITY): Payer: Self-pay

## 2021-07-14 NOTE — Progress Notes (Signed)
Patient Name  Courtney Hale, Courtney Hale Legal Sex  Female DOB  03-14-1966 SSN  [id number] Address  [address]  [place] [address] Phone  [phone] [facility])  [phone] (Mobile) *Preferred*    RE: CT Biopsy Received: 4 days ago Arne Cleveland, MD  Valli Glance [place]   CT core bone biopsy R iliac PET+ lesion   DDH        Previous Messages   ----- Message -----  From: Valli Glance  Sent: 07/10/2021  10:37 AM EDT  To: Ir Procedure Requests  Subject: CT Biopsy                                       Procedure: CT Biopsy    Reason: Metastatic cancer to bone, Bone lesion, femur, malignancy suspected, pelvis, malignancy suspected.    Hx: MR, PET In computer    Provider: Derek Jack    Contact:[phone]

## 2021-07-20 ENCOUNTER — Other Ambulatory Visit: Payer: Self-pay | Admitting: Radiology

## 2021-07-21 ENCOUNTER — Encounter (HOSPITAL_COMMUNITY): Payer: Self-pay

## 2021-07-21 ENCOUNTER — Other Ambulatory Visit: Payer: Self-pay

## 2021-07-21 ENCOUNTER — Ambulatory Visit (HOSPITAL_COMMUNITY)
Admission: RE | Admit: 2021-07-21 | Discharge: 2021-07-21 | Disposition: A | Discharge status: Home / Self Care | Payer: Medicaid Other | Source: Ambulatory Visit | Attending: Hematology | Admitting: Hematology

## 2021-07-21 DIAGNOSIS — Z79899 Other long term (current) drug therapy: Secondary | ICD-10-CM | POA: Diagnosis not present

## 2021-07-21 DIAGNOSIS — C7951 Secondary malignant neoplasm of bone: Secondary | ICD-10-CM | POA: Insufficient documentation

## 2021-07-21 DIAGNOSIS — R9389 Abnormal findings on diagnostic imaging of other specified body structures: Secondary | ICD-10-CM | POA: Diagnosis present

## 2021-07-21 DIAGNOSIS — I10 Essential (primary) hypertension: Secondary | ICD-10-CM | POA: Insufficient documentation

## 2021-07-21 DIAGNOSIS — Z85820 Personal history of malignant melanoma of skin: Secondary | ICD-10-CM | POA: Diagnosis not present

## 2021-07-21 DIAGNOSIS — M899 Disorder of bone, unspecified: Secondary | ICD-10-CM | POA: Diagnosis not present

## 2021-07-21 DIAGNOSIS — Z7984 Long term (current) use of oral hypoglycemic drugs: Secondary | ICD-10-CM | POA: Diagnosis not present

## 2021-07-21 LAB — APTT: aPTT: 30 seconds (ref 24–36)

## 2021-07-21 LAB — CBC
HCT: 39 % (ref 36.0–46.0)
Hemoglobin: 13.3 g/dL (ref 12.0–15.0)
MCH: 30.2 pg (ref 26.0–34.0)
MCHC: 34.1 g/dL (ref 30.0–36.0)
MCV: 88.6 fL (ref 80.0–100.0)
Platelets: 289 10*3/uL (ref 150–400)
RBC: 4.4 MIL/uL (ref 3.87–5.11)
RDW: 13.3 % (ref 11.5–15.5)
WBC: 7.6 10*3/uL (ref 4.0–10.5)
nRBC: 0 % (ref 0.0–0.2)

## 2021-07-21 LAB — PROTIME-INR
INR: 0.9 (ref 0.8–1.2)
Prothrombin Time: 12.4 seconds (ref 11.4–15.2)

## 2021-07-21 MED ORDER — MIDAZOLAM HCL 2 MG/2ML IJ SOLN
INTRAMUSCULAR | Status: DC | PRN
  Administered 2021-07-21 (×2): .5 mg via INTRAVENOUS
  Administered 2021-07-21: 1 mg via INTRAVENOUS

## 2021-07-21 MED ORDER — MIDAZOLAM HCL 2 MG/2ML IJ SOLN
INTRAMUSCULAR | Status: AC
  Filled 2021-07-21: qty 4

## 2021-07-21 MED ORDER — LIDOCAINE HCL 1 % IJ SOLN
INTRAMUSCULAR | Status: AC
  Filled 2021-07-21: qty 10

## 2021-07-21 MED ORDER — SODIUM CHLORIDE 0.9 % IV SOLN: INTRAVENOUS | Status: DC

## 2021-07-21 MED ORDER — FENTANYL CITRATE (PF) 100 MCG/2ML IJ SOLN
INTRAMUSCULAR | Status: AC
  Filled 2021-07-21: qty 4

## 2021-07-21 MED ORDER — FENTANYL CITRATE (PF) 100 MCG/2ML IJ SOLN
INTRAMUSCULAR | Status: DC | PRN
  Administered 2021-07-21: 50 ug via INTRAVENOUS
  Administered 2021-07-21 (×2): 25 ug via INTRAVENOUS

## 2021-07-21 NOTE — Procedures (Signed)
Interventional Radiology Procedure Note  Procedure: CT guided core biopsy of right iliac bone lesion. 1 x 11g bx, 1 x 13g bx  Findings: GG lesion of the right posterior iliac bone, with previous PET correlate  Complications: None  Recommendations: - Bedrest supine x 1 hrs - OTC's PRN  Pain - Follow biopsy results  Signed,  Dulcy Fanny. Earleen Newport, DO

## 2021-07-21 NOTE — H&P (Signed)
Chief Complaint: Patient was seen in consultation today for right iliac bone lesion biopsy at the request of [employer]  Referring Physician(s): Katragadda,Sreedhar  Supervising Physician: Corrie Mckusick  Patient Status: [name] - Out-pt  History of Present Illness: Courtney Hale is a 55 y.o. female with PMH of complications of anesthesia, depression, esophageal stricture, gastritis, melanoma with excision on left leg and back, HTN, migraines, nephrolithiasis, hysterectomy and recent diagnosis of metastatic cancer to bone.  Patient complained of lump on her left hip beginning in February and back pain that started in August 2022.  Patient's melanoma in her back and leg occurred approximately 5 years ago.  Patient had MR pelvis 06/24/2021 that resulted with multiple bone lesions in the pelvis and lower lumbar spine consistent with metastatic disease.  Patient was sent for MRI brain 07/08/2021 that showed 2 cm enhancing lesion left frontal bone lesion suspicious for metastatic disease. Patient was sent for PET scan 07/09/2021 that resulted hypermetabolic lesions associated with abnormal findings in the right pelvis and left anterior frontal bone concerning for malignancy/metastatic disease.  Patient was referred by Dr. Delton Coombes, oncology, for right iliac bone lesion biopsy.  Bone lesion biopsy was approved by Dr. Vernard Gambles, IR.     PET scan 07/09/2021: IMPRESSION: 1. Hypermetabolic lesions associated with the abnormal MRI findings within the RIGHT pelvis and anterior LEFT frontal bone are concerning for malignancy/metastatic disease but nonspecific. Unusual pattern for metastatic melanoma. Consider tissue sampling of the iliac bone lesions. 2. Hypermetabolic mediastinal lymph nodes are indeterminate but concerning for malignancy versus granulomatous disease. 3. Of note, skull base to thigh imaging performed rather than typical whole-body melanoma protocol.   Past Medical History:   Diagnosis Date   Complication of anesthesia    woke up during endoscopy and colonoscopy   Depression    Hemorrhoids    History of esophageal stricture    2011  peptic w/  dilatation   History of gastritis    2011   History of melanoma excision    2015  left leg/   12-09-2015 back    Hypertension    Microhematuria    Migraines    Nephrolithiasis    left side non-obstructive    Right ureteral stone    Urgency of urination    Wears contact lenses     Past Surgical History:  Procedure Laterality Date   ABDOMINAL HYSTERECTOMY     CARPAL TUNNEL RELEASE Right 01-05- 2015   COLONOSCOPY N/A 08/27/2015   Procedure: COLONOSCOPY;  Surgeon: Danie Binder, MD;  Location: [facility];  Service: Endoscopy;  Laterality: N/A;  0830   CYST EXCISION  2012     right hand   CYSTOSCOPY W/ URETERAL STENT PLACEMENT Right 12/22/2015   Procedure: CYSTOSCOPY WITH RETROGRADE PYELOGRAM/URETERAL STENT PLACEMENT;  Surgeon: Nickie Retort, MD;  Location: [facility];  Service: Urology;  Laterality: Right;   CYSTOSCOPY WITH RETROGRADE PYELOGRAM, URETEROSCOPY AND STENT PLACEMENT Right 01/05/2016   Procedure: CYSTOSCOPY WITH RETROGRADE PYELOGRAM, URETEROSCOPY AND STENT REPLACEMENT;  Surgeon: Nickie Retort, MD;  Location: [facility];  Service: Urology;  Laterality: Right;   DILATION AND CURETTAGE OF UTERUS  03-12-2009   w/  Suction   double balloon enteroscopy     Dr. Arsenio Loader at [facility]: no erosions, no evidence of Crohn's disease, path without Crohn's.    EGD with push enteroscopy  02-12-2010    patent distal peptic stricture with diffuse antral erythema, normal D1 and D2    LAPAROSCOPIC  ASSISTED VAGINAL HYSTERECTOMY  10-26-2014   w/  Bilateral Salpingoophorectomy   STONE EXTRACTION WITH BASKET Right 01/05/2016   Procedure: STONE EXTRACTION WITH BASKET;  Surgeon: Nickie Retort, MD;  Location: [facility];  Service: Urology;  Laterality: Right;   TONSILLECTOMY  1998   approx    Allergies: Topamax [topiramate] and Sulfa antibiotics  Medications: Prior to Admission medications   Medication Sig Start Date End Date Taking? Authorizing Provider  acetaminophen (TYLENOL) 500 MG tablet Take 1,000-1,500 mg by mouth every 6 (six) hours as needed for moderate pain. As needed   Yes [provider]  busPIRone (BUSPAR) 5 MG tablet TAKE 1 TABLET BY MOUTH THREE TIMES DAILY 03/25/21  Yes Lovena Le, Malena M, DO  calcium carbonate (TUMS - DOSED IN MG ELEMENTAL CALCIUM) 500 MG chewable tablet Chew 1,500 mg by mouth 2 (two) times daily as needed for indigestion or heartburn.   Yes [provider]  Carboxymethylcellul-Glycerin (LUBRICATING EYE DROPS OP) Place 1 drop into both eyes daily as needed (dry eyes).   Yes [provider]  Cholecalciferol (VITAMIN D) 50 MCG (2000 UT) tablet Take 2,000 Units by mouth daily.   Yes [provider]  Chromium 1000 MCG TABS Take 1,000 mcg by mouth daily.   Yes [provider]  CINNAMON PO Take 2,000 mg by mouth daily.   Yes [provider]  Cyanocobalamin (B-12) 2500 MCG TABS Take 2,500 mcg by mouth daily.   Yes [provider]  hydrochlorothiazide (HYDRODIURIL) 25 MG tablet Take 1 tablet by mouth once daily 02/20/21  Yes Taylor, Malena M, DO  ibuprofen (ADVIL) 200 MG tablet Take 600 mg by mouth every 6 (six) hours as needed for moderate pain or headache.   Yes [provider]  losartan (COZAAR) 100 MG tablet Take 1 tablet (100 mg total) by mouth daily. Needs labs and appt. 01/30/21  Yes Lovena Le, Malena M, DO  meclizine (ANTIVERT) 25 MG tablet Take 25 mg by mouth 3 (three) times daily as needed for dizziness.   Yes [provider]  meloxicam (MOBIC) 7.5 MG tablet TAKE 1 TABLET BY MOUTH ONCE DAILY AS NEEDED MIGRAINE 12/18/20  Yes Lovena Le, Malena M, DO  metFORMIN (GLUCOPHAGE) 850 MG tablet Take 1 tablet (850 mg total) by mouth 2 (two) times daily with a meal. 01/30/21  Yes  Lovena Le, Malena M, DO  PARoxetine (PAXIL) 40 MG tablet TAKE 1 TABLET BY MOUTH ONCE DAILY IN THE MORNING 06/25/21  Yes Lovena Le, Malena M, DO  Probiotic Product (PROBIOTIC PO) Take 1 capsule by mouth daily.   Yes [provider]  prochlorperazine (COMPAZINE) 10 MG tablet Take 1 tablet (10 mg total) by mouth every 6 (six) hours as needed for nausea or vomiting. 07/13/21  Yes Derek Jack, MD  rizatriptan (MAXALT-MLT) 10 MG disintegrating tablet Take 1 tablet (10 mg total) by mouth as needed for migraine. May repeat in 2 hours if needed. Max 2 per 24 hours 05/22/20  Yes Taylor, Malena M, DO  traMADol (ULTRAM) 50 MG tablet Take 1 tablet (50 mg total) by mouth every 6 (six) hours as needed. 07/13/21  Yes Derek Jack, MD  traZODone (DESYREL) 50 MG tablet TAKE 1 TO 2 TABLETS BY MOUTH EVERY DAY AT BEDTIME AS NEEDED FOR INSOMNIA Patient taking differently: Take 100 mg by mouth at bedtime. 03/25/21  Yes Lovena Le, Malena M, DO  valACYclovir (VALTREX) 500 MG tablet Take 500 mg by mouth 2 (two) times daily as needed (outbreaks). 06/05/21  Yes  [provider]  omeprazole (PRILOSEC) 20 MG capsule 1 PO 30 mins prior to breakfast and supper Patient not taking: No sig reported 12/05/19   Danie Binder, MD     Family History  Problem Relation Age of Onset   Diabetes Mother    Colon cancer Neg Hx     Social History   Socioeconomic History   Marital status: Single    Spouse name: Not on file   Number of children: Not on file   Years of education: Not on file   Highest education level: Not on file  Occupational History   Not on file  Tobacco Use   Smoking status: Never   Smokeless tobacco: Never  Vaping Use   Vaping Use: Never used  Substance and Sexual Activity   Alcohol use: Yes    Comment: occassionally   Drug use: No   Sexual activity: Not on file  Other Topics Concern   Not on file  Social History Narrative   Not on file   Social Determinants of Health   Financial  Resource Strain: Low Risk    Difficulty of Paying Living Expenses: Not hard at all  Food Insecurity: No Food Insecurity   Worried About Running Out of Food in the Last Year: Never true   [place] in the Last Year: Never true  Transportation Needs: No Transportation Needs   Lack of Transportation (Medical): No   Lack of Transportation (Non-Medical): No  Physical Activity: Inactive   Days of Exercise per Week: 0 days   Minutes of Exercise per Session: 0 min  Stress: Stress Concern Present   Feeling of Stress : Very much  Social Connections: Socially Isolated   Frequency of Communication with Friends and Family: More than three times a week   Frequency of Social Gatherings with Friends and Family: More than three times a week   Attends Religious Services: Never   Marine scientist or Organizations: No   Attends Archivist Meetings: Never   Marital Status: Never married     Review of Systems: A 12 point ROS discussed and pertinent positives are indicated in the HPI above.  All other systems are negative.  Review of Systems  Constitutional:  Positive for appetite change, chills and fatigue. Negative for fever.  HENT:  Positive for nosebleeds.   Respiratory:  Positive for cough and shortness of breath.   Cardiovascular:  Negative for chest pain and leg swelling.  Gastrointestinal:  Positive for abdominal pain and nausea. Negative for blood in stool, diarrhea and vomiting.  Musculoskeletal:  Positive for back pain.  Neurological:  Positive for weakness, light-headedness and headaches.   Vital Signs: BP 119/78   Pulse 78   Temp 98.2 F (36.8 C) (Oral)   Ht 5' 3.5" (1.613 m)   Wt 177 lb (80.3 kg)   LMP 10/26/2014   SpO2 97%   BMI 30.86 kg/m   Physical Exam Vitals reviewed.  Constitutional:      Appearance: Normal appearance. She is not ill-appearing.  HENT:     Head: Normocephalic and atraumatic.     Mouth/Throat:     Mouth: Mucous membranes are  moist.     Pharynx: Oropharynx is clear.  Eyes:     Pupils: Pupils are equal, round, and reactive to light.  Cardiovascular:     Rate and Rhythm: Normal rate and regular rhythm.     Pulses: Normal pulses.     Heart sounds: Normal  heart sounds. No murmur heard.   No gallop.  Pulmonary:     Effort: Pulmonary effort is normal. No respiratory distress.     Breath sounds: No stridor. No wheezing, rhonchi or rales.  Abdominal:     General: Bowel sounds are normal. There is no distension.     Palpations: Abdomen is soft.     Tenderness: There is abdominal tenderness. There is no guarding.  Musculoskeletal:     Right lower leg: No edema.     Left lower leg: No edema.  Skin:    General: Skin is warm and dry.  Neurological:     Mental Status: She is alert and oriented to person, place, and time. Mental status is at baseline.  Psychiatric:        Mood and Affect: Mood normal.        Behavior: Behavior normal.        Thought Content: Thought content normal.        Judgment: Judgment normal.    Imaging: MR Brain W Wo Contrast  Result Date: 07/08/2021 CLINICAL DATA:  History of melanoma. Rule out metastatic disease. Staging. EXAM: MRI HEAD WITHOUT AND WITH CONTRAST TECHNIQUE: Multiplanar, multiecho pulse sequences of the brain and surrounding structures were obtained without and with intravenous contrast. CONTRAST:  6 mL GADAVIST GADOBUTROL 1 MMOL/ML IV SOLN COMPARISON:  None. FINDINGS: Brain: No acute infarction, hemorrhage, hydrocephalus, extra-axial collection or mass lesion. Normal enhancement of the brain. No metastatic deposits in the brain. Vascular: Normal arterial flow voids. Skull and upper cervical spine: 2 cm enhancing lesion left frontal bone, highly suspicious for metastatic disease. No other skeletal lesion identified. Sinuses/Orbits: Negative Other: None IMPRESSION: 2 cm enhancing lesion left frontal bone highly suspicious for metastatic disease Negative for metastatic disease to  the brain. Electronically Signed   By: Franchot Gallo M.D.   On: 07/08/2021 17:11   MR Pelvis W Wo Contrast  Result Date: 06/26/2021 CLINICAL DATA:  Hip pain for 7 months. Indeterminate right sacral lesion seen on recent MRI of the femur. EXAM: MRI PELVIS WITHOUT AND WITH CONTRAST TECHNIQUE: Multiplanar multisequence MR imaging of the pelvis was performed both before and after administration of intravenous contrast. CONTRAST:  7.48mL GADAVIST GADOBUTROL 1 MMOL/ML IV SOLN COMPARISON:  05/14/2021 FINDINGS: Bones/Joint/Cartilage No fracture or dislocation. Normal alignment. No joint effusion. 10 mm T1 and T2 hyperintense enhancing bone lesion in the right sacrum with a smaller adjacent 2 mm bone lesion. 14 mm T1 and T2 hyperintense enhancing bone lesion in the right posterior ilium. 7 mm enhancing bone lesion left posterior acetabulum and a second 10 mm bone lesion in the left posterior acetabular roof. 9.7 mm enhancing bone lesion in the left side of the S1 vertebral body. Partially lysed T2 hyperintense bone lesion in the L3 vertebral body. 7 mm bone lesion in the left L4 pedicle. Incidental note made of a bone island in the right posterior acetabulum. Ligaments, Muscles and Tendons Muscles are normal. Soft tissue No fluid collection or hematoma.  No soft tissue mass. IMPRESSION: 1. Multiple bone lesions in the pelvis and lower lumbar spine most consistent with metastatic disease given the patient's history of melanoma and signal characteristics. Bone lesion seen in the left lesser trochanter and posterior aspect of the left intertrochanteric region on the recent MRI of the femur dated 05/14/2021 are also most consistent with metastatic disease. Electronically Signed   By: Kathreen Devoid M.D.   On: 06/26/2021 10:47   NM PET Image Initial (  PI) Skull Base To Thigh (F-18 FDG)  Result Date: 07/10/2021 CLINICAL DATA:  Subsequent encounter. treatment strategy for malignant melanoma. Indeterminate bone lesions within the  pelvis and the calvarium on MRI. EXAM: NUCLEAR MEDICINE PET SKULL BASE TO THIGH TECHNIQUE: 8.8 mCi F-18 FDG was injected intravenously. Full-ring PET imaging was performed from the skull base to thigh after the radiotracer. CT data was obtained and used for attenuation correction and anatomic localization. Of note a skull base to thigh PET-CT was performed instead of whole-body PET-CT which is typical for melanoma patients. Fasting blood glucose: 146 mg/dl COMPARISON:  Pelvic MRI 06/24/2021 FINDINGS: Mediastinal blood pool activity: SUV max 2.6 Liver activity: SUV max NA NECK: No hypermetabolic lymph nodes in the neck. Incidental CT findings: none CHEST: Bilateral small relative high density mediastinal lymph nodes with associated moderate metabolic activity. For example RIGHT lower paratracheal node measuring 10 mm with SUV max equal 4.5. LEFT lower paratracheal node measuring 10 mm with SUV max equal 5.5. Symmetric moderate activity in hilar lymph nodes which are difficult to assess on noncontrast CT. No suspicious pulmonary nodules. Incidental CT findings: none ABDOMEN/PELVIS: No abnormal hypermetabolic activity within the liver, pancreas, adrenal glands, or spleen. No hypermetabolic lymph nodes in the abdomen or pelvis. Incidental CT findings: none SKELETON: Lesion of concern in the posterior RIGHT iliac bone on comparison MRI does have focal hypermetabolic activity SUV max equal 7.1 (image 202). Subtle sclerotic lesion at this level. Second lesion in the adjacent RIGHT sacral ala also corresponds to MRI signal abnormality and is hypermetabolic with SUV max equal 4.1. No clear CT lesion. Within the superior aspect of the RIGHT scapular spine is a focus of metabolic activity with SUV max equal 4.6. Similar but less intense activity in the LEFT scapular spine. Finally, within the LEFT frontal bone partially imaged lytic lesion measures 2.1 cm (image 162 series 3) and has associated subtle metabolic activity.  Incidental CT findings: No abnormal metabolic activity associated with the LEFT hip. IMPRESSION: 1. Hypermetabolic lesions associated with the abnormal MRI findings within the RIGHT pelvis and anterior LEFT frontal bone are concerning for malignancy/metastatic disease but nonspecific. Unusual pattern for metastatic melanoma. Consider tissue sampling of the iliac bone lesions. 2. Hypermetabolic mediastinal lymph nodes are indeterminate but concerning for malignancy versus granulomatous disease. 3. Of note, skull base to thigh imaging performed rather than typical whole-body melanoma protocol. Electronically Signed   By: Suzy Bouchard M.D.   On: 07/10/2021 10:47    Labs:  CBC: Recent Labs    07/21/20 2259 01/26/21 1429 06/30/21 1608  WBC 7.6 8.7 7.0  HGB 13.4 14.2 13.0  HCT 38.5 41.7 38.0  PLT 255 320 265    COAGS: No results for input(s): INR, APTT in the last 8760 hours.  BMP: Recent Labs    07/21/20 2259 08/27/20 1517 01/26/21 1429 06/30/21 1608  NA 132* 140 143 139  K 3.3* 3.8 4.0 3.4*  CL 97* 97 102 107  CO2 23 27 24 24   GLUCOSE 269* 169* 105* 82  BUN 13 11 11 12   CALCIUM 9.0 9.8 9.5 9.0  CREATININE 0.61 0.61 0.56* 0.59  GFRNONAA >60 105  --  >60  GFRAA >60 121  --   --     LIVER FUNCTION TESTS: Recent Labs    01/26/21 1429 06/30/21 1608  BILITOT 0.3 0.3  AST 21 20  ALT 28 24  ALKPHOS 97 73  PROT 7.1 6.7  ALBUMIN 4.6 3.9    TUMOR MARKERS:  No results for input(s): AFPTM, CEA, CA199, CHROMGRNA in the last 8760 hours.  Assessment and Plan:  History of complications of anesthesia, depression, esophageal stricture, gastritis, melanoma with excision on left leg and back, HTN, migraines, nephrolithiasis, hysterectomy and recent diagnosis of metastatic cancer to bone.  Patient complained of lump on her left hip beginning in February and back pain that started in August 2022.  Patient's melanoma in her back and leg occurred approximately 5 years ago.  Patient had  MR pelvis 06/24/2021 that resulted with multiple bone lesions in the pelvis and lower lumbar spine consistent with metastatic disease.  Patient was sent for MRI brain 07/08/2021 that showed 2 cm enhancing lesion left frontal bone lesion suspicious for metastatic disease. Patient was sent for PET scan 07/09/2021 that resulted hypermetabolic lesions associated with abnormal findings in the right pelvis and left anterior frontal bone concerning for malignancy/metastatic disease.  Patient was referred by Dr. Delton Coombes, oncology, for right iliac bone lesion biopsy.  Bone lesion biopsy was approved by Dr. Vernard Gambles, IR.   Pt resting in bed. She is A&O, calm and pleasant.  She states she is NPO per order. She does not take thinners. Ordered labs pending.  Pt is in NAD.   Risks and benefits of right iliac bone lesion biopsy was discussed with the patient and/or patient's family including, but not limited to bleeding, infection, damage to adjacent structures or low yield requiring additional tests.  All of the questions were answered and there is agreement to proceed.  Consent signed and in chart.  Thank you for this interesting consult.  I greatly enjoyed meeting SIMONE RODENBECK and look forward to participating in their care.  A copy of this report was sent to the requesting provider on this date.  Electronically Signed: Tyson Alias, NP 07/21/2021, 9:32 AM   I spent a total of 30 minutes in face to face in clinical consultation, greater than 50% of which was counseling/coordinating care for right iliac bone lesion biopsy.

## 2021-07-21 NOTE — Progress Notes (Signed)
Patient was given discharge instructions. She verbalized understanding. 

## 2021-07-24 DIAGNOSIS — D869 Sarcoidosis, unspecified: Secondary | ICD-10-CM

## 2021-07-24 HISTORY — DX: Sarcoidosis, unspecified: D86.9

## 2021-07-27 NOTE — Progress Notes (Signed)
[place], [place] [address]   CLINIC:  Medical Oncology/Hematology  PCP:  Erven Colla, DO [address] / [place] [address] [phone]   REASON FOR VISIT:  Follow-up for multiple bone lesions  PRIOR THERAPY: none  NGS Results: not done  CURRENT THERAPY: surveillance  BRIEF ONCOLOGIC HISTORY:  Oncology History   No history exists.    CANCER STAGING: Cancer Staging Metastatic cancer to bone [facility]) Staging form: Bone - Appendicular Skeleton, Trunk, Skull, and Facial Bones, AJCC 8th Edition - Clinical stage from 06/30/2021: Stage IVB (cTX, cNX, pM1b) - Unsigned   INTERVAL HISTORY:  Courtney Hale, a 55 y.o. female, returns for routine follow-up of her multiple bone lesions. Courtney Hale was last seen on 07/13/2021.   Today she reports feeling good. She reports severe headaches, fatigue, dry cough, night sweats, bilateral hip pain, hot flashes, and bone pain. She denies smoking history. She denies fevers.   REVIEW OF SYSTEMS:  Review of Systems  Constitutional:  Positive for fatigue (depleted). Negative for appetite change and fever.  HENT:   Positive for trouble swallowing.   Respiratory:  Positive for cough and shortness of breath.   Cardiovascular:  Positive for chest pain.  Gastrointestinal:  Positive for diarrhea and nausea.  Endocrine: Positive for hot flashes.  Musculoskeletal:  Positive for arthralgias (bilateral hip pain).  Neurological:  Positive for dizziness, headaches (6/10) and numbness.  Psychiatric/Behavioral:  Positive for depression and sleep disturbance. The patient is nervous/anxious.   All other systems reviewed and are negative.  PAST MEDICAL/SURGICAL HISTORY:  Past Medical History:  Diagnosis Date   Complication of anesthesia    woke up during endoscopy and colonoscopy   Depression    Hemorrhoids    History of esophageal stricture    2011  peptic w/  dilatation   History of gastritis    2011    History of melanoma excision    2015  left leg/   12-09-2015 back    Hypertension    Microhematuria    Migraines    Nephrolithiasis    left side non-obstructive    Right ureteral stone    Urgency of urination    Wears contact lenses    Past Surgical History:  Procedure Laterality Date   ABDOMINAL HYSTERECTOMY     CARPAL TUNNEL RELEASE Right 01-05- 2015   COLONOSCOPY N/A 08/27/2015   Procedure: COLONOSCOPY;  Surgeon: Danie Binder, MD;  Location: [facility];  Service: Endoscopy;  Laterality: N/A;  0830   CYST EXCISION  2012     right hand   CYSTOSCOPY W/ URETERAL STENT PLACEMENT Right 12/22/2015   Procedure: CYSTOSCOPY WITH RETROGRADE PYELOGRAM/URETERAL STENT PLACEMENT;  Surgeon: Nickie Retort, MD;  Location: [facility];  Service: Urology;  Laterality: Right;   CYSTOSCOPY WITH RETROGRADE PYELOGRAM, URETEROSCOPY AND STENT PLACEMENT Right 01/05/2016   Procedure: CYSTOSCOPY WITH RETROGRADE PYELOGRAM, URETEROSCOPY AND STENT REPLACEMENT;  Surgeon: Nickie Retort, MD;  Location: [facility];  Service: Urology;  Laterality: Right;   DILATION AND CURETTAGE OF UTERUS  03-12-2009   w/  Suction   double balloon enteroscopy     Dr. Arsenio Loader at [facility]: no erosions, no evidence of Crohn's disease, path without Crohn's.    EGD with push enteroscopy  02-12-2010    patent distal peptic stricture with diffuse antral erythema, normal D1 and D2    LAPAROSCOPIC ASSISTED VAGINAL HYSTERECTOMY  10-26-2014   w/  Bilateral Salpingoophorectomy  STONE EXTRACTION WITH BASKET Right 01/05/2016   Procedure: STONE EXTRACTION WITH BASKET;  Surgeon: Nickie Retort, MD;  [facility];  Service: Urology;  Laterality: Right;   TONSILLECTOMY  1998  approx    SOCIAL HISTORY:  Social History   Socioeconomic History   Marital status: Single    Spouse name: Not on file   Number of children: Not on file   Years of education: Not on file   Highest education level: Not  on file  Occupational History   Not on file  Tobacco Use   Smoking status: Never   Smokeless tobacco: Never  Vaping Use   Vaping Use: Never used  Substance and Sexual Activity   Alcohol use: Yes    Comment: occassionally   Drug use: No   Sexual activity: Not on file  Other Topics Concern   Not on file  Social History Narrative   Not on file   Social Determinants of Health   Financial Resource Strain: Low Risk    Difficulty of Paying Living Expenses: Not hard at all  Food Insecurity: No Food Insecurity   Worried About Estate manager/land agent of Food in the Last Year: Never true   [place] in the Last Year: Never true  Transportation Needs: No Transportation Needs   Lack of Transportation (Medical): No   Lack of Transportation (Non-Medical): No  Physical Activity: Inactive   Days of Exercise per Week: 0 days   Minutes of Exercise per Session: 0 min  Stress: Stress Concern Present   Feeling of Stress : Very much  Social Connections: Socially Isolated   Frequency of Communication with Friends and Family: More than three times a week   Frequency of Social Gatherings with Friends and Family: More than three times a week   Attends Religious Services: Never   Marine scientist or Organizations: No   Attends Music therapist: Never   Marital Status: Never married  Human resources officer Violence: Not At Risk   Fear of Current or Ex-Partner: No   Emotionally Abused: No   Physically Abused: No   Sexually Abused: No    FAMILY HISTORY:  Family History  Problem Relation Age of Onset   Diabetes Mother    Colon cancer Neg Hx     CURRENT MEDICATIONS:  Current Outpatient Medications  Medication Sig Dispense Refill   acetaminophen (TYLENOL) 500 MG tablet Take 1,000-1,500 mg by mouth every 6 (six) hours as needed for moderate pain. As needed     busPIRone (BUSPAR) 5 MG tablet TAKE 1 TABLET BY MOUTH THREE TIMES DAILY 45 tablet 2   calcium carbonate (TUMS - DOSED IN MG  ELEMENTAL CALCIUM) 500 MG chewable tablet Chew 1,500 mg by mouth 2 (two) times daily as needed for indigestion or heartburn.     Carboxymethylcellul-Glycerin (LUBRICATING EYE DROPS OP) Place 1 drop into both eyes daily as needed (dry eyes).     Cholecalciferol (VITAMIN D) 50 MCG (2000 UT) tablet Take 2,000 Units by mouth daily.     Chromium 1000 MCG TABS Take 1,000 mcg by mouth daily.     CINNAMON PO Take 2,000 mg by mouth daily.     Cyanocobalamin (B-12) 2500 MCG TABS Take 2,500 mcg by mouth daily.     hydrochlorothiazide (HYDRODIURIL) 25 MG tablet Take 1 tablet by mouth once daily 90 tablet 0   ibuprofen (ADVIL) 200 MG tablet Take 600 mg by mouth every 6 (six) hours as needed  for moderate pain or headache.     losartan (COZAAR) 100 MG tablet Take 1 tablet (100 mg total) by mouth daily. Needs labs and appt. 90 tablet 1   meclizine (ANTIVERT) 25 MG tablet Take 25 mg by mouth 3 (three) times daily as needed for dizziness.     meloxicam (MOBIC) 7.5 MG tablet TAKE 1 TABLET BY MOUTH ONCE DAILY AS NEEDED MIGRAINE 30 tablet 2   metFORMIN (GLUCOPHAGE) 850 MG tablet Take 1 tablet (850 mg total) by mouth 2 (two) times daily with a meal. 180 tablet 1   omeprazole (PRILOSEC) 20 MG capsule 1 PO 30 mins prior to breakfast and supper (Patient not taking: No sig reported) 60 capsule 11   PARoxetine (PAXIL) 40 MG tablet TAKE 1 TABLET BY MOUTH ONCE DAILY IN THE MORNING 30 tablet 1   Probiotic Product (PROBIOTIC PO) Take 1 capsule by mouth daily.     prochlorperazine (COMPAZINE) 10 MG tablet Take 1 tablet (10 mg total) by mouth every 6 (six) hours as needed for nausea or vomiting. 30 tablet 2   rizatriptan (MAXALT-MLT) 10 MG disintegrating tablet Take 1 tablet (10 mg total) by mouth as needed for migraine. May repeat in 2 hours if needed. Max 2 per 24 hours 10 tablet 1   traMADol (ULTRAM) 50 MG tablet Take 1 tablet (50 mg total) by mouth every 6 (six) hours as needed. 60 tablet 0   traZODone (DESYREL) 50 MG tablet  TAKE 1 TO 2 TABLETS BY MOUTH EVERY DAY AT BEDTIME AS NEEDED FOR INSOMNIA (Patient taking differently: Take 100 mg by mouth at bedtime.) 60 tablet 2   valACYclovir (VALTREX) 500 MG tablet Take 500 mg by mouth 2 (two) times daily as needed (outbreaks).     No current facility-administered medications for this visit.    ALLERGIES:  Allergies  Allergen Reactions   Topamax [Topiramate] Other (See Comments)    Developed kidney stones   Sulfa Antibiotics Other (See Comments)    Burning sensation, flushing to skin    PHYSICAL EXAM:  Performance status (ECOG): 0 - Asymptomatic  There were no vitals filed for this visit. Wt Readings from Last 3 Encounters:  07/21/21 177 lb (80.3 kg)  07/13/21 177 lb 4 oz (80.4 kg)  06/30/21 177 lb 6.4 oz (80.5 kg)   Physical Exam   LABORATORY DATA:  I have reviewed the labs as listed.  CBC Latest Ref Rng & Units 07/21/2021 06/30/2021 01/26/2021  WBC 4.0 - 10.5 K/uL 7.6 7.0 8.7  Hemoglobin 12.0 - 15.0 g/dL 13.3 13.0 14.2  Hematocrit 36.0 - 46.0 % 39.0 38.0 41.7  Platelets 150 - 400 K/uL 289 265 320   CMP Latest Ref Rng & Units 06/30/2021 01/26/2021 08/27/2020  Glucose 70 - 99 mg/dL 82 105(H) 169(H)  BUN 6 - 20 mg/dL 12 11 11   Creatinine 0.44 - 1.00 mg/dL 0.59 0.56(L) 0.61  Sodium 135 - 145 mmol/L 139 143 140  Potassium 3.5 - 5.1 mmol/L 3.4(L) 4.0 3.8  Chloride 98 - 111 mmol/L 107 102 97  CO2 22 - 32 mmol/L 24 24 27   Calcium 8.9 - 10.3 mg/dL 9.0 9.5 9.8  Total Protein 6.5 - 8.1 g/dL 6.7 7.1 -  Total Bilirubin 0.3 - 1.2 mg/dL 0.3 0.3 -  Alkaline Phos 38 - 126 U/L 73 97 -  AST 15 - 41 U/L 20 21 -  ALT 0 - 44 U/L 24 28 -    DIAGNOSTIC IMAGING:  I have independently reviewed the scans  and discussed with the patient. MR Brain W Wo Contrast  Result Date: 07/08/2021 CLINICAL DATA:  History of melanoma. Rule out metastatic disease. Staging. EXAM: MRI HEAD WITHOUT AND WITH CONTRAST TECHNIQUE: Multiplanar, multiecho pulse sequences of the brain and surrounding  structures were obtained without and with intravenous contrast. CONTRAST:  6 mL GADAVIST GADOBUTROL 1 MMOL/ML IV SOLN COMPARISON:  None. FINDINGS: Brain: No acute infarction, hemorrhage, hydrocephalus, extra-axial collection or mass lesion. Normal enhancement of the brain. No metastatic deposits in the brain. Vascular: Normal arterial flow voids. Skull and upper cervical spine: 2 cm enhancing lesion left frontal bone, highly suspicious for metastatic disease. No other skeletal lesion identified. Sinuses/Orbits: Negative Other: None IMPRESSION: 2 cm enhancing lesion left frontal bone highly suspicious for metastatic disease Negative for metastatic disease to the brain. Electronically Signed   By: Franchot Gallo M.D.   On: 07/08/2021 17:11   NM PET Image Initial (PI) Skull Base To Thigh (F-18 FDG)  Result Date: 07/10/2021 CLINICAL DATA:  Subsequent encounter. treatment strategy for malignant melanoma. Indeterminate bone lesions within the pelvis and the calvarium on MRI. EXAM: NUCLEAR MEDICINE PET SKULL BASE TO THIGH TECHNIQUE: 8.8 mCi F-18 FDG was injected intravenously. Full-ring PET imaging was performed from the skull base to thigh after the radiotracer. CT data was obtained and used for attenuation correction and anatomic localization. Of note a skull base to thigh PET-CT was performed instead of whole-body PET-CT which is typical for melanoma patients. Fasting blood glucose: 146 mg/dl COMPARISON:  Pelvic MRI 06/24/2021 FINDINGS: Mediastinal blood pool activity: SUV max 2.6 Liver activity: SUV max NA NECK: No hypermetabolic lymph nodes in the neck. Incidental CT findings: none CHEST: Bilateral small relative high density mediastinal lymph nodes with associated moderate metabolic activity. For example RIGHT lower paratracheal node measuring 10 mm with SUV max equal 4.5. LEFT lower paratracheal node measuring 10 mm with SUV max equal 5.5. Symmetric moderate activity in hilar lymph nodes which are difficult to  assess on noncontrast CT. No suspicious pulmonary nodules. Incidental CT findings: none ABDOMEN/PELVIS: No abnormal hypermetabolic activity within the liver, pancreas, adrenal glands, or spleen. No hypermetabolic lymph nodes in the abdomen or pelvis. Incidental CT findings: none SKELETON: Lesion of concern in the posterior RIGHT iliac bone on comparison MRI does have focal hypermetabolic activity SUV max equal 7.1 (image 202). Subtle sclerotic lesion at this level. Second lesion in the adjacent RIGHT sacral ala also corresponds to MRI signal abnormality and is hypermetabolic with SUV max equal 4.1. No clear CT lesion. Within the superior aspect of the RIGHT scapular spine is a focus of metabolic activity with SUV max equal 4.6. Similar but less intense activity in the LEFT scapular spine. Finally, within the LEFT frontal bone partially imaged lytic lesion measures 2.1 cm (image 162 series 3) and has associated subtle metabolic activity. Incidental CT findings: No abnormal metabolic activity associated with the LEFT hip. IMPRESSION: 1. Hypermetabolic lesions associated with the abnormal MRI findings within the RIGHT pelvis and anterior LEFT frontal bone are concerning for malignancy/metastatic disease but nonspecific. Unusual pattern for metastatic melanoma. Consider tissue sampling of the iliac bone lesions. 2. Hypermetabolic mediastinal lymph nodes are indeterminate but concerning for malignancy versus granulomatous disease. 3. Of note, skull base to thigh imaging performed rather than typical whole-body melanoma protocol. Electronically Signed   By: Suzy Bouchard M.D.   On: 07/10/2021 10:47   CT Biopsy  Result Date: 07/21/2021 INDICATION: 55 year old female with a history of melanoma, with PET-CT demonstrating progression  including an FDG avid lesion in the right posterior iliac bone she presents today for biopsy EXAM: CT BIOPSY MEDICATIONS: None. ANESTHESIA/SEDATION: Moderate (conscious) sedation was  employed during this procedure. A total of Versed 2.0 mg and Fentanyl 100 mcg was administered intravenously. Moderate Sedation Time: 21 minutes. The patient's level of consciousness and vital signs were monitored continuously by radiology nursing throughout the procedure under my direct supervision. FLUOROSCOPY TIME:  CT COMPLICATIONS: None PROCEDURE: Informed written consent was obtained from the patient after a thorough discussion of the procedural risks, benefits and alternatives. All questions were addressed. Maximal Sterile Barrier Technique was utilized including caps, mask, sterile gowns, sterile gloves, sterile drape, hand hygiene and skin antiseptic. A timeout was performed prior to the initiation of the procedure. Patient was positioned prone. Scout CT of the pelvis was performed for surgical planning purposes. The patient's prior PET-CT was available for review for correlate. The right posterior pelvis was prepped with Chlorhexidine in a sterile fashion, and a sterile drape was applied covering the operative field. A sterile gown and sterile gloves were used for the procedure. Local anesthesia was provided with 1% Lidocaine. The partially ground-glass lesion within the posterior right iliac bone was targeted for biopsy. The skin and subcutaneous tissues were infiltrated with 1% lidocaine without epinephrine. A small stab incision was made with an 11 blade scalpel, and an 11 gauge Murphy needle was advanced with CT guidance to the posterior cortex. Manual forced was used to advance the needle through the posterior cortex and the stylet was removed. Sequential CT imaging was performed to position the needle tip just on the superficial aspect of the lesion. The coaxial 13 gauge biopsy needle was advanced two-three cm into the bone and the bone biopsy was retrieved into formalin. The 11 gauge Murphy needle was then advanced without the stylet for a second core biopsy. The core biopsy was retrieved and also  placed into formalin. Final CT was performed. Manual pressure was used for hemostasis and a sterile dressing was placed. No complications were encountered no significant blood loss was encountered. Patient tolerated the procedure well and remained hemodynamically stable throughout. IMPRESSION: Status post CT-guided bone lesion biopsy of posterior right iliac bone lesion. Signed, Dulcy Fanny. Dellia Nims, RPVI [facility] Electronically Signed   By: Corrie Mckusick D.O.   On: 07/21/2021 12:00     ASSESSMENT:  1.  Multiple bone lesions: - Presentation with left hip pain since February 2022. - Left hip mass noticed in May 2022, tender and painful. - Low back pain started since 25 May 2021. - Ultrasound of the left upper thigh soft tissue on 04/08/2021 was negative with differential including large occult subcutaneous lipoma. - MRI of the left femur with and without contrast on 05/14/2021-no soft tissue mass, fluid collection or hematoma at the site of clinical concern in the left lateral thigh.  Indeterminate bone lesion in the S1 vertebral body, right sacrum and left posterior intertrochanteric region. - MRI of the pelvis on 06/24/2021 showed multiple bone lesions in the pelvis, lower lumbar spine consistent with metastatic disease. 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 Juel Burrow office. - She recently had a basal cell carcinoma of the nose and left temporal region resected.  2.  Social/family history: - She lives at home with her son.  Mom lives close by. 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  She is non-smoker. - Maternal aunt had throat cancer.  Another maternal aunt had bone cancer.  Another maternal aunt had pancreatic cancer.  Niece had ovarian cancer.  Paternal uncle had leukemia.   PLAN:  1.  Bone lesions and mediastinal  lymphadenopathy: -PET scan on 07/10/2021 showed hypermetabolic lesions in the right iliac bone, right sacral ala, left frontal area.  Right lower paratracheal lymph node 10 mm, SUV 4.5.  Left lower paratracheal lymph node 10 mm SUV 5.5.  Symmetric moderate activity in the hilar lymph nodes difficult to assess on noncontrast CT.  No lung nodules. - Biopsy of the right posterior iliac bone on 07/21/2021 showed noncaseating granulomatous inflammation with no evidence of malignancy.  CD68 highlights the presence of granulomas. 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 - I will make a referral to pulmonology and get their opinion if we need to biopsy the mediastinal lymph nodes or proceed with treatment of sarcoidosis. - She does not have any fevers or weight loss.  She does have hot flashes.  She has dry cough.  No family history of sarcoidosis.  She has bilateral hip pains. - We will see her back in 4 months for follow-up.   2.  Lower back and left hip pain: -She will continue tramadol every 6 hours as needed.  3.  Left frontal headaches: -MRI of the brain on 07/08/2021 showed 2 cm enhancing lesions in the left frontal bone with no intracranial metastatic disease.  4.  Intermittent nausea: -She has intermittent nausea but no vomiting. - Continue Compazine every 6 hours as needed.   Orders placed this encounter:  No orders of the defined types were placed in this encounter.    Derek Jack, MD [place] [phone]   I, Thana Ates, am acting as a scribe for Dr. Derek Jack.  I, Derek Jack MD, have reviewed the above documentation for accuracy and completeness, and I agree with the above.

## 2021-07-28 ENCOUNTER — Other Ambulatory Visit: Payer: Self-pay

## 2021-07-28 ENCOUNTER — Inpatient Hospital Stay (HOSPITAL_COMMUNITY): Payer: Medicaid Other | Attending: Hematology | Admitting: Hematology

## 2021-07-28 VITALS — BP 117/84 | HR 74 | Temp 99.0°F | Resp 18 | Wt 179.8 lb

## 2021-07-28 DIAGNOSIS — M899 Disorder of bone, unspecified: Secondary | ICD-10-CM | POA: Insufficient documentation

## 2021-07-28 DIAGNOSIS — R110 Nausea: Secondary | ICD-10-CM | POA: Insufficient documentation

## 2021-07-28 DIAGNOSIS — D869 Sarcoidosis, unspecified: Secondary | ICD-10-CM

## 2021-07-28 DIAGNOSIS — M25551 Pain in right hip: Secondary | ICD-10-CM | POA: Insufficient documentation

## 2021-07-28 DIAGNOSIS — R590 Localized enlarged lymph nodes: Secondary | ICD-10-CM | POA: Insufficient documentation

## 2021-07-28 DIAGNOSIS — C7951 Secondary malignant neoplasm of bone: Secondary | ICD-10-CM | POA: Diagnosis not present

## 2021-07-28 DIAGNOSIS — M25552 Pain in left hip: Secondary | ICD-10-CM | POA: Insufficient documentation

## 2021-07-28 DIAGNOSIS — R059 Cough, unspecified: Secondary | ICD-10-CM | POA: Insufficient documentation

## 2021-07-28 DIAGNOSIS — R519 Headache, unspecified: Secondary | ICD-10-CM | POA: Diagnosis not present

## 2021-07-28 DIAGNOSIS — R232 Flushing: Secondary | ICD-10-CM | POA: Diagnosis not present

## 2021-07-28 NOTE — Addendum Note (Signed)
Addended by: Joie Bimler on: 07/28/2021 04:10 PM   Modules accepted: Orders

## 2021-07-28 NOTE — Patient Instructions (Addendum)
[place] at [facility] Discharge Instructions  You were seen today by Dr. Delton Coombes. He went over your recent results. You will be referred to a pulmonary specialist. Dr. Delton Coombes will see you back in 4 months for labs and follow up.   Thank you for choosing [place] at [facility] to provide your oncology and hematology care.  To afford each patient quality time with our provider, please arrive at least 15 minutes before your scheduled appointment time.   If you have a lab appointment with the [place] please come in thru the Main Entrance and check in at the main information desk  You need to re-schedule your appointment should you arrive 10 or more minutes late.  We strive to give you quality time with our providers, and arriving late affects you and other patients whose appointments are after yours.  Also, if you no show three or more times for appointments you may be dismissed from the clinic at the providers discretion.     Again, thank you for choosing [facility].  Our hope is that these requests will decrease the amount of time that you wait before being seen by our physicians.       _____________________________________________________________  Should you have questions after your visit to [facility], please contact our office at [phone] between the hours of 8:00 a.m. and 4:30 p.m.  Voicemails left after 4:00 p.m. will not be returned until the following business day.  For prescription refill requests, have your pharmacy contact our office and allow 72 hours.    Cancer Center Support Programs:   > Cancer Support Group  2nd Tuesday of the month 1pm-2pm, Journey Room

## 2021-07-30 LAB — SURGICAL PATHOLOGY

## 2021-08-10 DIAGNOSIS — Z029 Encounter for administrative examinations, unspecified: Secondary | ICD-10-CM

## 2021-08-14 ENCOUNTER — Ambulatory Visit (INDEPENDENT_AMBULATORY_CARE_PROVIDER_SITE_OTHER): Payer: Medicaid Other

## 2021-08-14 ENCOUNTER — Ambulatory Visit (INDEPENDENT_AMBULATORY_CARE_PROVIDER_SITE_OTHER): Payer: Medicaid Other | Admitting: Internal Medicine

## 2021-08-14 ENCOUNTER — Other Ambulatory Visit: Payer: Self-pay

## 2021-08-14 ENCOUNTER — Encounter: Payer: Self-pay | Admitting: Internal Medicine

## 2021-08-14 VITALS — BP 120/78 | HR 76 | Temp 98.3°F | Ht 63.5 in | Wt 181.0 lb

## 2021-08-14 DIAGNOSIS — D869 Sarcoidosis, unspecified: Secondary | ICD-10-CM

## 2021-08-14 DIAGNOSIS — Z23 Encounter for immunization: Secondary | ICD-10-CM

## 2021-08-14 LAB — SEDIMENTATION RATE: Sed Rate: 20 mm/hr (ref 0–30)

## 2021-08-14 MED ORDER — PREDNISONE 10 MG PO TABS: ORAL_TABLET | ORAL | 0 refills | Status: DC

## 2021-08-14 NOTE — Patient Instructions (Signed)
Please remember to go to the lab and x-ray department  for your tests - we will call you with the results when they are available.  Your eye doctor needs to see you now and at least yearly        Prednisone 10 mg x 2 until better then 1 daily until seen  - take with breakfast     We will set you up with Dr Loanne Drilling for sarcoidosis follow up

## 2021-08-14 NOTE — Assessment & Plan Note (Addendum)
?   Onset of symptoms attributable to sarcoid - NCG on   bx of R Post iliac bone 07/21/21  - ACE level 08/14/2021 =  - 08/14/2021  Prednisone 20 mg daily until better then 10 mg daily   A good rule of thumb is that >95% of pts with active sarcoid in any organ will have some plain cxr changes - on the other hand  if there are active pulmonary symptoms the cxr will look much worse than the patient:  No evidence of either scenario here so this is very atypical sarcoid.   She doe have cough and sob and lots of aches and pains with Pos PET for bone involvement though s hypercalcemia so I'm not confident this is all sarcoid but since so symptomatic ok to try relatively low doses eg 20 mg daily to see if get prompt improvement and the reduce to 10 mg daily while awaiting consult by Dr Loanne Drilling, who has a special interest in this type of case.  In meantime needs to see her eye doctor about non-specific viz complaints to establish regular sarcoid evals  Discussed in detail all the  indications, usual  risks and alternatives  relative to the benefits with patient who agrees to proceed with Rx as outlined.             Each maintenance medication was reviewed in detail including emphasizing most importantly the difference between maintenance and prns and under what circumstances the prns are to be triggered using an action plan format where appropriate.  Total time for H and P, chart review, counseling,   and generating customized AVS unique to this new pt office visit / same day charting = 30min

## 2021-08-14 NOTE — Progress Notes (Signed)
Courtney Hale, female    DOB: September 24, 1966     MRN: [id number]   Brief patient profile:  89 yowf never smoker referred to pulmonary clinic 08/14/2021 by Dr Delton Coombes  for  [facility] on   bx of R Post iliac bone 07/21/21 during w/u for suspected metastatic ca.      History of Present Illness  08/14/2021  Pulmonary/ 1st office eval/Corrigan Kretschmer  Chief Complaint  Patient presents with   Consult    Patient reports that she feels shortness of breath at rest and exertion.   Abnormal lfts ? 1999 never did liver bx  L hip sts ?  Lipoma 2022  Kidney stone and ? Elevated Calcium ? dated Doe/ cough x 3-4 month prior to OV   PET > bx of R post iliac pos NCG on bx  Cough is dry/ sporadic daytime. Doe = MMRC1 = can walk nl pace, flat grade, can't hurry or go uphills or steps s sob    No obvious day to day or daytime variability or assoc excess/ purulent sputum or mucus plugs or hemoptysis or cp or chest tightness, subjective wheeze or overt sinus or hb symptoms.   Sleeping s  without nocturnal  or early am exacerbation  of respiratory  c/o's or need for noct saba. Also denies any obvious fluctuation of symptoms with weather or environmental changes or other aggravating or alleviating factors except as outlined above   No unusual exposure hx or h/o childhood pna/ asthma or knowledge of premature birth.  Current Allergies, Complete Past Medical History, Past Surgical History, Family History, and Social History were reviewed in [employer] record.  ROS  The following are not active complaints unless bolded Hoarseness, sore throat, dysphagia, dental problems, itching, sneezing,  nasal congestion or discharge of excess mucus or purulent secretions, ear ache,   fever, chills, sweats, unintended wt loss or wt gain, classically pleuritic or exertional cp,  orthopnea pnd or arm/hand swelling  or leg swelling, presyncope, palpitations, abdominal pain, anorexia, nausea, vomiting, diarrhea  or  change in bowel habits or change in bladder habits, change in stools or change in urine, dysuria, hematuria,  rash, arthralgias/myalgias/hurts all over, visual complaints, headache, numbness, weakness or ataxia or problems with walking or coordination,  change in mood or  memory.               Past Medical History:  Diagnosis Date   Complication of anesthesia    woke up during endoscopy and colonoscopy   Depression    Hemorrhoids    History of esophageal stricture    2011  peptic w/  dilatation   History of gastritis    2011   History of melanoma excision    2015  left leg/   12-09-2015 back    Hypertension    Microhematuria    Migraines    Nephrolithiasis    left side non-obstructive    Right ureteral stone    Urgency of urination    Wears contact lenses     Outpatient Medications Prior to Visit  Medication Sig Dispense Refill   Acetaminophen (TYLENOL 8 HOUR PO) Take by mouth as needed.     busPIRone (BUSPAR) 5 MG tablet TAKE 1 TABLET BY MOUTH THREE TIMES DAILY 45 tablet 2   calcium carbonate (TUMS - DOSED IN MG ELEMENTAL CALCIUM) 500 MG chewable tablet Chew 1,500 mg by mouth 2 (two) times daily as needed for indigestion or heartburn.  Carboxymethylcellul-Glycerin (LUBRICATING EYE DROPS OP) Place 1 drop into both eyes daily as needed (dry eyes).     Cholecalciferol (VITAMIN D) 50 MCG (2000 UT) tablet Take 2,000 Units by mouth daily.     Chromium 1000 MCG TABS Take 1,000 mcg by mouth daily.     CINNAMON PO Take 2,000 mg by mouth daily.     Cyanocobalamin (B-12) 2500 MCG TABS Take 2,500 mcg by mouth daily.     hydrochlorothiazide (HYDRODIURIL) 25 MG tablet Take 1 tablet by mouth once daily 90 tablet 0   ibuprofen (ADVIL) 200 MG tablet Take 600 mg by mouth every 6 (six) hours as needed for moderate pain or headache.     losartan (COZAAR) 100 MG tablet Take 1 tablet (100 mg total) by mouth daily. Needs labs and appt. 90 tablet 1   meclizine (ANTIVERT) 25 MG tablet Take 25  mg by mouth 3 (three) times daily as needed for dizziness.     meloxicam (MOBIC) 7.5 MG tablet TAKE 1 TABLET BY MOUTH ONCE DAILY AS NEEDED MIGRAINE 30 tablet 2   metFORMIN (GLUCOPHAGE) 850 MG tablet Take 1 tablet (850 mg total) by mouth 2 (two) times daily with a meal. 180 tablet 1   omeprazole (PRILOSEC) 20 MG capsule 1 PO 30 mins prior to breakfast and supper 60 capsule 11   PARoxetine (PAXIL) 40 MG tablet TAKE 1 TABLET BY MOUTH ONCE DAILY IN THE MORNING 30 tablet 1   Probiotic Product (PROBIOTIC PO) Take 1 capsule by mouth daily.     prochlorperazine (COMPAZINE) 10 MG tablet Take 1 tablet (10 mg total) by mouth every 6 (six) hours as needed for nausea or vomiting. 30 tablet 2   rizatriptan (MAXALT-MLT) 10 MG disintegrating tablet Take 1 tablet (10 mg total) by mouth as needed for migraine. May repeat in 2 hours if needed. Max 2 per 24 hours 10 tablet 1   traMADol (ULTRAM) 50 MG tablet Take 1 tablet (50 mg total) by mouth every 6 (six) hours as needed. 60 tablet 0   traZODone (DESYREL) 50 MG tablet TAKE 1 TO 2 TABLETS BY MOUTH EVERY DAY AT BEDTIME AS NEEDED FOR INSOMNIA 60 tablet 2   valACYclovir (VALTREX) 500 MG tablet Take 500 mg by mouth 2 (two) times daily as needed (outbreaks).     acetaminophen (TYLENOL) 500 MG tablet Take 1,000-1,500 mg by mouth every 6 (six) hours as needed for moderate pain. As needed (Patient not taking: No sig reported)     No facility-administered medications prior to visit.     Objective:     BP 120/78 (BP Location: Left Arm, Patient Position: Sitting, Cuff Size: Normal)   Pulse 76   Temp 98.3 F (36.8 C) (Oral)   Ht 5' 3.5" (1.613 m)   Wt 181 lb (82.1 kg)   LMP 10/26/2014   SpO2 96% Comment: ra  BMI 31.56 kg/m   SpO2: 96 % (ra)  Amb mod obese pleasant wf nad    HEENT : pt wearing mask not removed for exam due to covid -19 concerns.    NECK :  without JVD/Nodes/TM/ nl carotid upstrokes bilaterally   LUNGS: no acc muscle use,  Nl contour chest  which is clear to A and P bilaterally without cough on insp or exp maneuvers   CV:  RRR  no s3 or murmur or increase in P2, and no edema   ABD: mod obese  soft and nontender with nl inspiratory excursion in the supine position. No  bruits or organomegaly appreciated, bowel sounds nl  MS:  Nl gait/ ext warm without deformities, calf tenderness, cyanosis or clubbing No obvious joint restrictions   SKIN: warm and dry without lesions    NEURO:  alert, approp, nl sensorium with  no motor or cerebellar deficits apparent.   CXR PA and Lateral:   08/14/2021 :    I personally reviewed images and agree with radiology impression as follows:  No acute finding in the chest. My review: no adenopathy/ no RN changes    Labs ordered/ reviewed:      Chemistry      Component Value Date/Time   NA 139 06/30/2021 1608   NA 143 01/26/2021 1429   K 3.4 (L) 06/30/2021 1608   CL 107 06/30/2021 1608   CO2 24 06/30/2021 1608   BUN 12 06/30/2021 1608   BUN 11 01/26/2021 1429   CREATININE 0.59 06/30/2021 1608      Component Value Date/Time   CALCIUM 9.0 06/30/2021 1608   ALKPHOS 73 06/30/2021 1608   AST 20 06/30/2021 1608   ALT 24 06/30/2021 1608   BILITOT 0.3 06/30/2021 1608   BILITOT 0.3 01/26/2021 1429        Lab Results  Component Value Date   WBC 7.6 07/21/2021   HGB 13.3 07/21/2021   HCT 39.0 07/21/2021   MCV 88.6 07/21/2021   PLT 289 07/21/2021     No results found for: DDIMER    Lab Results  Component Value Date   TSH 0.996 08/27/2020     Lab Results  Component Value Date   PROBNP 95.6 03/15/2009       Lab Results  Component Value Date   ESRSEDRATE 20 08/14/2021   ESRSEDRATE 14 02/10/2010         Assessment   Sarcoidosis ? Onset of symptoms attributable to sarcoid - NCG on   bx of R Post iliac bone 07/21/21  - ACE level 08/14/2021 =  - 08/14/2021  Prednisone 20 mg daily until better then 10 mg daily   A good rule of thumb is that >95% of pts with active sarcoid  in any organ will have some plain cxr changes - on the other hand  if there are active pulmonary symptoms the cxr will look much worse than the patient:  No evidence of either scenario here so this is very atypical sarcoid.   She doe have cough and sob and lots of aches and pains with Pos PET for bone involvement though s hypercalcemia so I'm not confident this is all sarcoid but since so symptomatic ok to try relatively low doses eg 20 mg daily to see if get prompt improvement and the reduce to 10 mg daily while awaiting consult by Dr Loanne Drilling, who has a special interest in this type of case.  In meantime needs to see her eye doctor about non-specific viz complaints to establish regular sarcoid evals  Discussed in detail all the  indications, usual  risks and alternatives  relative to the benefits with patient who agrees to proceed with Rx as outlined.             Each maintenance medication was reviewed in detail including emphasizing most importantly the difference between maintenance and prns and under what circumstances the prns are to be triggered using an action plan format where appropriate.  Total time for H and P, chart review, counseling,   and generating customized AVS unique to this new pt office visit / same  day charting = 29min            Christinia Gully, MD 08/14/2021

## 2021-08-15 ENCOUNTER — Encounter: Payer: Self-pay | Admitting: Internal Medicine

## 2021-08-17 ENCOUNTER — Other Ambulatory Visit: Payer: Self-pay | Admitting: Family Medicine

## 2021-08-17 DIAGNOSIS — G4700 Insomnia, unspecified: Secondary | ICD-10-CM

## 2021-08-17 DIAGNOSIS — F32A Depression, unspecified: Secondary | ICD-10-CM

## 2021-08-17 DIAGNOSIS — F419 Anxiety disorder, unspecified: Secondary | ICD-10-CM

## 2021-08-17 LAB — ANGIOTENSIN CONVERTING ENZYME: Angiotensin-Converting Enzyme: 33 U/L (ref 9–67)

## 2021-08-26 ENCOUNTER — Encounter (HOSPITAL_COMMUNITY): Payer: Self-pay

## 2021-08-26 ENCOUNTER — Emergency Department (HOSPITAL_COMMUNITY): Payer: Medicaid Other

## 2021-08-26 ENCOUNTER — Emergency Department (HOSPITAL_COMMUNITY)
Admission: EM | Admit: 2021-08-26 | Discharge: 2021-08-26 | Disposition: A | Discharge status: Home / Self Care | Payer: Medicaid Other | Attending: Emergency Medicine | Admitting: Emergency Medicine

## 2021-08-26 ENCOUNTER — Other Ambulatory Visit: Payer: Self-pay

## 2021-08-26 DIAGNOSIS — Z8616 Personal history of COVID-19: Secondary | ICD-10-CM | POA: Diagnosis not present

## 2021-08-26 DIAGNOSIS — I10 Essential (primary) hypertension: Secondary | ICD-10-CM | POA: Insufficient documentation

## 2021-08-26 DIAGNOSIS — R079 Chest pain, unspecified: Secondary | ICD-10-CM

## 2021-08-26 DIAGNOSIS — Z85830 Personal history of malignant neoplasm of bone: Secondary | ICD-10-CM | POA: Diagnosis not present

## 2021-08-26 DIAGNOSIS — Z7984 Long term (current) use of oral hypoglycemic drugs: Secondary | ICD-10-CM | POA: Insufficient documentation

## 2021-08-26 DIAGNOSIS — R072 Precordial pain: Secondary | ICD-10-CM | POA: Insufficient documentation

## 2021-08-26 DIAGNOSIS — Z79899 Other long term (current) drug therapy: Secondary | ICD-10-CM | POA: Diagnosis not present

## 2021-08-26 DIAGNOSIS — E119 Type 2 diabetes mellitus without complications: Secondary | ICD-10-CM | POA: Insufficient documentation

## 2021-08-26 LAB — BASIC METABOLIC PANEL
Anion gap: 10 (ref 5–15)
BUN: 18 mg/dL (ref 6–20)
CO2: 24 mmol/L (ref 22–32)
Calcium: 9.4 mg/dL (ref 8.9–10.3)
Chloride: 101 mmol/L (ref 98–111)
Creatinine, Ser: 0.66 mg/dL (ref 0.44–1.00)
GFR, Estimated: >60 mL/min (ref >60)
Glucose, Bld: 198 mg/dL — ABNORMAL HIGH (ref 70–99)
Potassium: 3.7 mmol/L (ref 3.5–5.1)
Sodium: 135 mmol/L (ref 135–145)

## 2021-08-26 LAB — TROPONIN I (HIGH SENSITIVITY)
Troponin I (High Sensitivity): 2 ng/L (ref <18)
Troponin I (High Sensitivity): <2 ng/L (ref <18)

## 2021-08-26 LAB — HEPATIC FUNCTION PANEL
ALT: 36 U/L (ref 0–44)
AST: 24 U/L (ref 15–41)
Albumin: 3.8 g/dL (ref 3.5–5.0)
Alkaline Phosphatase: 76 U/L (ref 38–126)
Bilirubin, Direct: 0.1 mg/dL (ref 0.0–0.2)
Indirect Bilirubin: 0.3 mg/dL (ref 0.3–0.9)
Total Bilirubin: 0.4 mg/dL (ref 0.3–1.2)
Total Protein: 6.8 g/dL (ref 6.5–8.1)

## 2021-08-26 LAB — CBC
HCT: 39.8 % (ref 36.0–46.0)
Hemoglobin: 13.7 g/dL (ref 12.0–15.0)
MCH: 30.9 pg (ref 26.0–34.0)
MCHC: 34.4 g/dL (ref 30.0–36.0)
MCV: 89.8 fL (ref 80.0–100.0)
Platelets: 304 10*3/uL (ref 150–400)
RBC: 4.43 MIL/uL (ref 3.87–5.11)
RDW: 13.1 % (ref 11.5–15.5)
WBC: 11.7 10*3/uL — ABNORMAL HIGH (ref 4.0–10.5)
nRBC: 0 % (ref 0.0–0.2)

## 2021-08-26 LAB — D-DIMER, QUANTITATIVE: D-Dimer, Quant: 0.28 ug/mL-FEU (ref 0.00–0.50)

## 2021-08-26 MED ORDER — PANTOPRAZOLE SODIUM 40 MG PO TBEC
40.0000 mg | DELAYED_RELEASE_TABLET | Freq: Once | ORAL | Status: AC
  Administered 2021-08-26: 40 mg via ORAL
  Filled 2021-08-26: qty 1

## 2021-08-26 MED ORDER — OMEPRAZOLE 20 MG PO CPDR: 20.0000 mg | DELAYED_RELEASE_CAPSULE | Freq: Every day | ORAL | 0 refills | Status: DC

## 2021-08-26 NOTE — ED Triage Notes (Signed)
Pt presents to ED with complaints of mid chest pain with difficulty breathing on and off x couple months.

## 2021-08-26 NOTE — ED Provider Notes (Signed)
[facility] Provider Note   CSN: [id number] Arrival date & time: 08/26/21  1221     History Chief Complaint  Patient presents with   Chest Pain    Courtney Hale is a 55 y.o. female.  HPI  Patient with significant medical history including bone lesions, lymphadenopathy positive PET scan, presents to the emergency department with chief complaint of substernal chest pain.  Patient states it has been going on for last couple of months but over the last couple days has been getting worse.  She states the pain will come on randomly, will last 20 minutes or so and then go away on her own, pain does not radiate, not associate with shortness of breath, nausea, vomiting, become diaphoretic, lightheaded dizziness.  She does states that when she has the pain it becomes painful to breathe but this is only during her flareups, she denies dyspnea on exertion, denies exertion making this pain worse, denies orthopnea or worsening peripheral edema.  She has no significant cardiac history, no history of PEs or DVTs, currently not on hormone therapy.  Patient denies alleviating or aggravating factors. Denies  fevers, chills, chest pain, shortness breath, dumping, nausea, vomiting, worsening peripheral edema.  Past Medical History:  Diagnosis Date   Complication of anesthesia    woke up during endoscopy and colonoscopy   Depression    Hemorrhoids    History of esophageal stricture    2011  peptic w/  dilatation   History of gastritis    2011   History of melanoma excision    2015  left leg/   12-09-2015 back    Hypertension    Microhematuria    Migraines    Nephrolithiasis    left side non-obstructive    Right ureteral stone    Urgency of urination    Wears contact lenses     Patient Active Problem List   Diagnosis Date Noted   Sarcoidosis 08/14/2021   Metastatic cancer to bone (HCC) 06/30/2021   Left hip pain 03/09/2021   Abscess of skin 03/09/2021   Lipoma  03/09/2021   Screening for colon cancer 03/09/2021   History of COVID-19 10/30/2020   Type 2 diabetes mellitus (HCC) 09/30/2019   Rectal bleeding 04/13/2018   GERD (gastroesophageal reflux disease) 07/27/2017   Anxiety and depression 12/18/2016   Venous stasis dermatitis of both lower extremities 06/09/2016   Diastasis of rectus abdominis 03/18/2016   IBS (irritable bowel syndrome) 03/18/2016   Melanoma of skin (HCC) 03/18/2016   Headache, chronic migraine without aura 01/31/2013   Hypertension 01/31/2013    Past Surgical History:  Procedure Laterality Date   ABDOMINAL HYSTERECTOMY     CARPAL TUNNEL RELEASE Right 01-05- 2015   COLONOSCOPY N/A 08/27/2015   Procedure: COLONOSCOPY;  Surgeon: Sandi L Fields, MD;  Location: [facility];  Service: Endoscopy;  Laterality: N/A;  0830   CYST EXCISION  2012     right hand   CYSTOSCOPY W/ URETERAL STENT PLACEMENT Right 12/22/2015   Procedure: CYSTOSCOPY WITH RETROGRADE PYELOGRAM/URETERAL STENT PLACEMENT;  Surgeon: Brian James Budzyn, MD;  Location: [facility];  Service: Urology;  Laterality: Right;   CYSTOSCOPY WITH RETROGRADE PYELOGRAM, URETEROSCOPY AND STENT PLACEMENT Right 01/05/2016   Procedure: CYSTOSCOPY WITH RETROGRADE PYELOGRAM, URETEROSCOPY AND STENT REPLACEMENT;  Surgeon: Brian James Budzyn, MD;  Location: [facility];  Service: Urology;  Laterality: Right;   DILATION AND CURETTAGE OF UTERUS  03-12-2009   w/  Suction   double balloon   enteroscopy     Dr. Arsenio Loader at [facility]: no erosions, no evidence of Crohn's disease, path without Crohn's.    EGD with push enteroscopy  02-12-2010    patent distal peptic stricture with diffuse antral erythema, normal D1 and D2    LAPAROSCOPIC ASSISTED VAGINAL HYSTERECTOMY  10-26-2014   w/  Bilateral Salpingoophorectomy   STONE EXTRACTION WITH BASKET Right 01/05/2016   Procedure: STONE EXTRACTION WITH BASKET;  Surgeon: Nickie Retort, MD;  Location: [facility];  Service:  Urology;  Laterality: Right;   TONSILLECTOMY  1998  approx     OB History   No obstetric history on file.     Family History  Problem Relation Age of Onset   Diabetes Mother    Colon cancer Neg Hx     Social History   Tobacco Use   Smoking status: Never   Smokeless tobacco: Never  Vaping Use   Vaping Use: Never used  Substance Use Topics   Alcohol use: Yes    Comment: occassionally   Drug use: No    Home Medications Prior to Admission medications   Medication Sig Start Date End Date Taking? Authorizing Provider  omeprazole (PRILOSEC) 20 MG capsule Take 1 capsule (20 mg total) by mouth daily. 08/26/21 09/25/21 Yes Marcello Fennel, PA-C  Acetaminophen (TYLENOL 8 HOUR PO) Take by mouth as needed.    [provider]  busPIRone (BUSPAR) 5 MG tablet TAKE 1 TABLET BY MOUTH THREE TIMES DAILY 03/25/21   Lovena Le, Malena M, DO  calcium carbonate (TUMS - DOSED IN MG ELEMENTAL CALCIUM) 500 MG chewable tablet Chew 1,500 mg by mouth 2 (two) times daily as needed for indigestion or heartburn.    [provider]  Carboxymethylcellul-Glycerin (LUBRICATING EYE DROPS OP) Place 1 drop into both eyes daily as needed (dry eyes).    [provider]  Cholecalciferol (VITAMIN D) 50 MCG (2000 UT) tablet Take 2,000 Units by mouth daily.    [provider]  Chromium 1000 MCG TABS Take 1,000 mcg by mouth daily.    [provider]  CINNAMON PO Take 2,000 mg by mouth daily.    [provider]  Cyanocobalamin (B-12) 2500 MCG TABS Take 2,500 mcg by mouth daily.    [provider]  hydrochlorothiazide (HYDRODIURIL) 25 MG tablet Take 1 tablet by mouth once daily 02/20/21   Lovena Le, Malena M, DO  ibuprofen (ADVIL) 200 MG tablet Take 600 mg by mouth every 6 (six) hours as needed for moderate pain or headache.    [provider]  losartan (COZAAR) 100 MG tablet TAKE 1 TABLET BY MOUTH ONCE DAILY. NEEDS LABS AND APPT 08/17/21   Coral Spikes, DO   meclizine (ANTIVERT) 25 MG tablet Take 25 mg by mouth 3 (three) times daily as needed for dizziness.    [provider]  meloxicam (MOBIC) 7.5 MG tablet TAKE 1 TABLET BY MOUTH ONCE DAILY AS NEEDED MIGRAINE 12/18/20   Elvia Collum M, DO  metFORMIN (GLUCOPHAGE) 850 MG tablet Take 1 tablet (850 mg total) by mouth 2 (two) times daily with a meal. 01/30/21   Lovena Le, Malena M, DO  PARoxetine (PAXIL) 40 MG tablet TAKE 1 TABLET BY MOUTH ONCE DAILY IN THE MORNING 08/17/21   Coral Spikes, DO  predniSONE (DELTASONE) 10 MG tablet 2 daily until feeling better then 1 daily with breakfast 08/14/21   Tanda Rockers, MD  Probiotic Product (PROBIOTIC PO) Take 1 capsule by mouth daily.  [provider]  prochlorperazine (COMPAZINE) 10 MG tablet Take 1 tablet (10 mg total) by mouth every 6 (six) hours as needed for nausea or vomiting. 07/13/21   Derek Jack, MD  rizatriptan (MAXALT-MLT) 10 MG disintegrating tablet Take 1 tablet (10 mg total) by mouth as needed for migraine. May repeat in 2 hours if needed. Max 2 per 24 hours 05/22/20   Elvia Collum M, DO  traMADol (ULTRAM) 50 MG tablet Take 1 tablet (50 mg total) by mouth every 6 (six) hours as needed. 07/13/21   Derek Jack, MD  traZODone (DESYREL) 50 MG tablet TAKE 1 TO 2 TABLETS BY MOUTH EVERY DAY AT BEDTIME AS NEEDED FOR INSOMNIA 08/17/21   Coral Spikes, DO  valACYclovir (VALTREX) 500 MG tablet Take 500 mg by mouth 2 (two) times daily as needed (outbreaks). 06/05/21   [provider]    Allergies    Topamax [topiramate] and Sulfa antibiotics  Review of Systems   Review of Systems  Constitutional:  Negative for chills and fever.  HENT:  Negative for congestion.   Respiratory:  Negative for shortness of breath.   Cardiovascular:  Positive for chest pain. Negative for palpitations.  Gastrointestinal:  Negative for abdominal pain, diarrhea, nausea and vomiting.  Genitourinary:  Negative for enuresis.   Musculoskeletal:  Negative for back pain.  Skin:  Negative for rash.  Neurological:  Negative for dizziness.  Hematological:  Does not bruise/bleed easily.   Physical Exam Updated Vital Signs BP 104/71   Pulse 77   Temp 98.4 F (36.9 C) (Oral)   Resp 20   Ht 5' 3" (1.6 m)   Wt 81.6 kg   LMP 10/26/2014   SpO2 95%   BMI 31.89 kg/m   Physical Exam Vitals and nursing note reviewed.  Constitutional:      General: She is not in acute distress.    Appearance: She is not ill-appearing.  HENT:     Head: Normocephalic and atraumatic.     Nose: No congestion.  Eyes:     Conjunctiva/sclera: Conjunctivae normal.  Cardiovascular:     Rate and Rhythm: Normal rate and regular rhythm.     Pulses: Normal pulses.     Heart sounds: No murmur heard.   No friction rub. No gallop.  Pulmonary:     Effort: No respiratory distress.     Breath sounds: No wheezing, rhonchi or rales.     Comments: Patient has some slight pain in the inferior aspect of her sternum, no crepitus or deformities present. Abdominal:     General: There is no distension.     Palpations: Abdomen is soft.     Tenderness: There is abdominal tenderness. There is no right CVA tenderness or left CVA tenderness.     Comments: Abdomen nondistended, dull to percussion, slight tender to palpation in her epigastric region, no guarding, rebound has peritoneal sign, negative Murphy sign or McBurney point.  Musculoskeletal:     Right lower leg: No edema.     Left lower leg: No edema.  Skin:    General: Skin is warm and dry.  Neurological:     Mental Status: She is alert.  Psychiatric:        Mood and Affect: Mood normal.    ED Results / Procedures / Treatments   Labs (all labs ordered are listed, but only abnormal results are displayed) Labs Reviewed  BASIC METABOLIC PANEL - Abnormal; Notable for the following components:      Result  Value   Glucose, Bld 198 (*)    All other components within normal limits  CBC -  Abnormal; Notable for the following components:   WBC 11.7 (*)    All other components within normal limits  D-DIMER, QUANTITATIVE  HEPATIC FUNCTION PANEL  TROPONIN I (HIGH SENSITIVITY)  TROPONIN I (HIGH SENSITIVITY)    EKG None  Radiology DG Chest 2 View  Result Date: 08/26/2021 CLINICAL DATA:  Chest pain and difficulty breathing on and off for several months. EXAM: CHEST - 2 VIEW COMPARISON:  08/14/2021 FINDINGS: Cardiac silhouette is normal in size. Normal mediastinal and hilar contours. Clear lungs.  No pleural effusion or pneumothorax. Skeletal structures are intact. IMPRESSION: No active cardiopulmonary disease. Electronically Signed   By: Lajean Manes M.D.   On: 08/26/2021 12:53    Procedures Procedures   Medications Ordered in ED Medications  pantoprazole (PROTONIX) EC tablet 40 mg (has no administration in time range)    ED Course  I have reviewed the triage vital signs and the nursing notes.  Pertinent labs & imaging results that were available during my care of the patient were reviewed by me and considered in my medical decision making (see chart for details).    MDM Rules/Calculators/A&P                          Initial impression-presents with substernal pain.  She is alert, does not appear acute stress, vital signs are reassuring.  Triage obtain basic lab work-up we will continue to monitor.  Work-up-CBC shows slight leukocytosis of 11.7, BMP shows hyperglycemia of 198, hepatic functions unremarkable, negative delta troponin, chest x-ray unremarkable, EKG sinus without signs of ischemia.  Reassessment-patient is updated lab or imaging, has no complaints this time, patient agreeable for discharge   Rule out-I have low suspicion for ACS as history is atypical, patient has no cardiac history, EKG was sinus rhythm without signs of ischemia, patient had a delta troponin.  Low suspicion for PE as patient denies pleuritic chest pain, shortness of breath, patient  denies leg pain, no pedal edema noted on exam, patient is a negative delta troponin, vital signs are reassuring.  Low suspicion for AAA or aortic dissection as history is atypical, patient has low risk factors.  Low suspicion for liver or gallbladder abnormality as she has no right upper quadrant tenderness, no elevation liver enzymes, alk phos, T bili.  Low suspicion for bowel obstruction as abdomen is nondistended dull to percussion, still passing gas and having bowel movements.  Low suspicion for systemic infection as patient is nontoxic-appearing, vital signs reassuring, no obvious source infection noted on exam.   Plan-  Substernal pain-unclear etiology but differential includes costochondritis, GERD, esophagitis, esophagus spasms.  I suspect this is more acid reflux as she describes pain as a burning-like sensation, will start her on a PPI, have her follow-up with GI for further evaluation.  Gave her strict return precautions.  Vital signs have remained stable, no indication for hospital admission.  Patient given at home care as well strict return precautions.  Patient verbalized that they understood agreed to said plan.  Final Clinical Impression(s) / ED Diagnoses Final diagnoses:  Nonspecific chest pain    Rx / DC Orders ED Discharge Orders          Ordered    omeprazole (PRILOSEC) 20 MG capsule  Daily        08/26/21 1702  Marcello Fennel, PA-C 08/26/21 1705    Milton Ferguson, MD 08/28/21 0900

## 2021-08-26 NOTE — Discharge Instructions (Signed)
Lab work and imaging are reassuring, started on an acid pill please take as prescribed.  Please follow-up with GI for further evaluation.  Come back to the emergency department if you develop chest pain, shortness of breath, severe abdominal pain, uncontrolled nausea, vomiting, diarrhea.

## 2021-08-27 ENCOUNTER — Encounter: Payer: Self-pay | Admitting: Pulmonary Disease

## 2021-08-27 ENCOUNTER — Ambulatory Visit (INDEPENDENT_AMBULATORY_CARE_PROVIDER_SITE_OTHER): Payer: Medicaid Other | Admitting: Pulmonary Disease

## 2021-08-27 VITALS — BP 102/80 | HR 80 | Temp 99.0°F | Ht 63.5 in | Wt 184.0 lb

## 2021-08-27 DIAGNOSIS — D849 Immunodeficiency, unspecified: Secondary | ICD-10-CM | POA: Diagnosis not present

## 2021-08-27 DIAGNOSIS — D869 Sarcoidosis, unspecified: Secondary | ICD-10-CM

## 2021-08-27 LAB — VITAMIN D 25 HYDROXY (VIT D DEFICIENCY, FRACTURES): VITD: 67.74 ng/mL (ref 30.00–100.00)

## 2021-08-27 MED ORDER — ALBUTEROL SULFATE HFA 108 (90 BASE) MCG/ACT IN AERS: 2.0000 | INHALATION_SPRAY | Freq: Four times a day (QID) | RESPIRATORY_TRACT | 6 refills | Status: DC | PRN

## 2021-08-27 NOTE — Patient Instructions (Addendum)
Pulmonary sarcoidosis with bone involvement - Dx in 06/2021 via right iliac bone biopsy - Immunosuppression with prednisone - Annual PFTs.  Last PFTs - never completed - Recommend annual ophthalmology exam  - EKG reviewed. No evidence of conduction abnormalities.   Pulmonary sarcoid Shortness of breath --ARRANGE for pulmonary function tests --START Albuterol TWO puffs as needed for shortness of breath  Chronic immunosuppression --CONTINUE prednisone 20 mg for total 7 weeks. Plan to decrease to 10 mg on that visit --PURCHASE omeprazole 20 mg over-the-counter. OK to take 1 pill daily or 2 pills twice a day --ORDER QuantiFERON-TB order  Bone sarcoid involvement --ORDER 25-hydroxyvitamin D --ORDER 1,25-dihydroxyvitamin D   Follow-up with me on December 8th with PFTs prior to appointment (prefer same day or before appt if needed)

## 2021-08-27 NOTE — Progress Notes (Signed)
Subjective:   PATIENT ID: Courtney Hale GENDER: female DOB: 13-Nov-1965, MRN: [id number]   HPI  Chief Complaint  Patient presents with   Consult    Sarcoid, sugar has been in the 300 since on prednisone     Reason for Visit: New patient to me  [name] is a 55 year old female with history of melanoma s/p skin resections with DM2 who presents for sarcoidosis.  She was initially referred to [facility] shortness of breath with exertion and at rest.  She was seen by Dr. Melvyn Novas for sarcoid.  She has been followed by Dr. Delton Coombes in hematology for multiple bone lesions after presenting for left hip pain since February 2022. PET demonstrated hypermetabolic mediastinal lymphadenopathy and right iliac, right sacral ala and anterior left frontal bone lesions. She underwent bone biopsy of right iliac bone lesion on 07/21/2021 which returned with noncaseating granulomatous inflammation.  Dr. Melvyn Novas started patient on 20 mg prednisone and transferred to my care in November 2022.  Yesterday she presented to the ED for acute on chronic atypical chest pain that was worsening.  EKG negative for ischemia.  ED PA evaluated and had low suspicion for ACS.  Started on PPI for reflux and referred to GI.  Since starting the steroids she reports she is unsure if it has been effective. She has shortness of breath with panic attacks. Sometimes unable to take a deep breath. Occasional dry cough.  I have personally reviewed patient's past medical/family/social history, allergies, current medications.  Past Medical History:  Diagnosis Date   Complication of anesthesia    woke up during endoscopy and colonoscopy   Depression    Hemorrhoids    History of esophageal stricture    2011  peptic w/  dilatation   History of gastritis    2011   History of melanoma excision    2015  left leg/   12-09-2015 back    Hypertension    Microhematuria    Migraines    Nephrolithiasis    left side  non-obstructive    Right ureteral stone    Urgency of urination    Wears contact lenses      Family History  Problem Relation Age of Onset   Diabetes Mother    Colon cancer Neg Hx      Social History   Occupational History   Not on file  Tobacco Use   Smoking status: Never   Smokeless tobacco: Never  Vaping Use   Vaping Use: Never used  Substance and Sexual Activity   Alcohol use: Yes    Comment: occassionally   Drug use: No   Sexual activity: Not on file    Allergies  Allergen Reactions   Topamax [Topiramate] Other (See Comments)    Developed kidney stones   Sulfa Antibiotics Other (See Comments)    Burning sensation, flushing to skin     Outpatient Medications Prior to Visit  Medication Sig Dispense Refill   Acetaminophen (TYLENOL 8 HOUR PO) Take by mouth as needed.     busPIRone (BUSPAR) 5 MG tablet TAKE 1 TABLET BY MOUTH THREE TIMES DAILY 45 tablet 2   calcium carbonate (TUMS - DOSED IN MG ELEMENTAL CALCIUM) 500 MG chewable tablet Chew 1,500 mg by mouth 2 (two) times daily as needed for indigestion or heartburn.     Carboxymethylcellul-Glycerin (LUBRICATING EYE DROPS OP) Place 1 drop into both eyes daily as needed (dry eyes).     Cholecalciferol (VITAMIN D) 50 MCG (  2000 UT) tablet Take 2,000 Units by mouth daily.     Chromium 1000 MCG TABS Take 1,000 mcg by mouth daily.     CINNAMON PO Take 2,000 mg by mouth daily.     Cyanocobalamin (B-12) 2500 MCG TABS Take 2,500 mcg by mouth daily.     hydrochlorothiazide (HYDRODIURIL) 25 MG tablet Take 1 tablet by mouth once daily 90 tablet 0   ibuprofen (ADVIL) 200 MG tablet Take 600 mg by mouth every 6 (six) hours as needed for moderate pain or headache.     losartan (COZAAR) 100 MG tablet TAKE 1 TABLET BY MOUTH ONCE DAILY. NEEDS LABS AND APPT 90 tablet 0   meclizine (ANTIVERT) 25 MG tablet Take 25 mg by mouth 3 (three) times daily as needed for dizziness.     meloxicam (MOBIC) 7.5 MG tablet TAKE 1 TABLET BY MOUTH ONCE  DAILY AS NEEDED MIGRAINE 30 tablet 2   metFORMIN (GLUCOPHAGE) 850 MG tablet Take 1 tablet (850 mg total) by mouth 2 (two) times daily with a meal. 180 tablet 1   omeprazole (PRILOSEC) 20 MG capsule Take 1 capsule (20 mg total) by mouth daily. 30 capsule 0   PARoxetine (PAXIL) 40 MG tablet TAKE 1 TABLET BY MOUTH ONCE DAILY IN THE MORNING 30 tablet 0   predniSONE (DELTASONE) 10 MG tablet 2 daily until feeling better then 1 daily with breakfast 100 tablet 0   Probiotic Product (PROBIOTIC PO) Take 1 capsule by mouth daily.     prochlorperazine (COMPAZINE) 10 MG tablet Take 1 tablet (10 mg total) by mouth every 6 (six) hours as needed for nausea or vomiting. 30 tablet 2   rizatriptan (MAXALT-MLT) 10 MG disintegrating tablet Take 1 tablet (10 mg total) by mouth as needed for migraine. May repeat in 2 hours if needed. Max 2 per 24 hours 10 tablet 1   traMADol (ULTRAM) 50 MG tablet Take 1 tablet (50 mg total) by mouth every 6 (six) hours as needed. 60 tablet 0   traZODone (DESYREL) 50 MG tablet TAKE 1 TO 2 TABLETS BY MOUTH EVERY DAY AT BEDTIME AS NEEDED FOR INSOMNIA 60 tablet 0   valACYclovir (VALTREX) 500 MG tablet Take 500 mg by mouth 2 (two) times daily as needed (outbreaks).     No facility-administered medications prior to visit.    Review of Systems  Constitutional:  Positive for malaise/fatigue. Negative for chills, diaphoresis, fever and weight loss.  HENT:  Negative for congestion, ear pain and sore throat.   Respiratory:  Positive for cough and shortness of breath. Negative for hemoptysis, sputum production and wheezing.   Cardiovascular:  Negative for chest pain, palpitations and leg swelling.  Gastrointestinal:  Negative for abdominal pain, heartburn and nausea.  Genitourinary:  Negative for frequency.  Musculoskeletal:  Positive for back pain, joint pain and myalgias.  Skin:  Negative for itching and rash.  Neurological:  Negative for dizziness, weakness and headaches.   Endo/Heme/Allergies:  Does not bruise/bleed easily.  Psychiatric/Behavioral:  Negative for depression. The patient is not nervous/anxious.     Objective:   Vitals:   08/27/21 0936  BP: 102/80  Pulse: 80  Temp: 99 F (37.2 C)  TempSrc: Oral  SpO2: 96%  Weight: 184 lb (83.5 kg)  Height: 5' 3.5" (1.613 m)  SpO2: 96 % O2 Device: None (Room air)  Physical Exam: General: Well-appearing, no acute distress HENT: [place], AT Eyes: EOMI, no scleral icterus Respiratory: Clear to auscultation bilaterally.  No crackles, wheezing or rales Cardiovascular:  RRR, -M/R/G, no JVD Extremities:-Edema,-tenderness Neuro: AAO x4, CNII-XII grossly intact Psych: Normal mood, normal affect  Data Reviewed:  Imaging: PET 6/50/35 - Hypermetabolic bilateral hilar and mediastinal lymph nodules, right ilac lesion, right sacral ala, right scapular spine, left scapular spine and left frontal bone  PFT: None on file  Labs: CMP Latest Ref Rng & Units 08/26/2021 06/30/2021 01/26/2021  Glucose 70 - 99 mg/dL 198(H) 82 105(H)  BUN 6 - 20 mg/dL '18 12 11  ' Creatinine 0.44 - 1.00 mg/dL 0.66 0.59 0.56(L)  Sodium 135 - 145 mmol/L 135 139 143  Potassium 3.5 - 5.1 mmol/L 3.7 3.4(L) 4.0  Chloride 98 - 111 mmol/L 101 107 102  CO2 22 - 32 mmol/L '24 24 24  ' Calcium 8.9 - 10.3 mg/dL 9.4 9.0 9.5  Total Protein 6.5 - 8.1 g/dL 6.8 6.7 7.1  Total Bilirubin 0.3 - 1.2 mg/dL 0.4 0.3 0.3  Alkaline Phos 38 - 126 U/L 76 73 97  AST 15 - 41 U/L '24 20 21  ' ALT 0 - 44 U/L 36 24 28   CBC    Component Value Date/Time   WBC 11.7 (H) 08/26/2021 1253   RBC 4.43 08/26/2021 1253   HGB 13.7 08/26/2021 1253   HGB 14.2 01/26/2021 1429   HCT 39.8 08/26/2021 1253   HCT 41.7 01/26/2021 1429   PLT 304 08/26/2021 1253   PLT 320 01/26/2021 1429   MCV 89.8 08/26/2021 1253   MCV 87 01/26/2021 1429   MCH 30.9 08/26/2021 1253   MCHC 34.4 08/26/2021 1253   RDW 13.1 08/26/2021 1253   RDW 12.8 01/26/2021 1429   LYMPHSABS 3.2 06/30/2021 1608    LYMPHSABS 3.7 (H) 01/26/2021 1429   MONOABS 0.6 06/30/2021 1608   EOSABS 0.2 06/30/2021 1608   EOSABS 0.1 01/26/2021 1429   BASOSABS 0.0 06/30/2021 1608   BASOSABS 0.0 01/26/2021 1429   Mild leukocytosis in setting of steroid use  Normal LFTs  08/14/21 ACE 33 ESR  20    Assessment & Plan:   Discussion: 55 year old female with history of melanoma s/p skin resections with DM2 who presents for sarcoidosis with bone involvement.  We discussed the clinical course of sarcoid and management including serial PFTs, labs, eye exam, and EKG and chest imaging if indicated. If symptoms suggest sarcoid flare not controlled on current prednisone taper,  we would manage with steroids +/- biologics.  Pulmonary sarcoidosis with bone involvement - Dx in 06/2021 via right iliac bone biopsy - Immunosuppression with prednisone - Annual PFTs.  Last PFTs - never completed - Recommend annual ophthalmology exam  - EKG reviewed. No evidence of conduction abnormalities.   Pulmonary sarcoid Shortness of breath --ARRANGE for pulmonary function tests --START Albuterol TWO puffs as needed for shortness of breath  Chronic immunosuppression --CONTINUE prednisone 20 mg for total 7 weeks. Plan to decrease to 10 mg on that visit --PURCHASE omeprazole 20 mg over-the-counter. OK to take 1 pill daily or 2 pills twice a day --ORDER QuantiFERON-TB order  Bone sarcoid involvement --ORDER 25-hydroxyvitamin D --ORDER 1,25-dihydroxyvitamin D   Health Maintenance Immunization History  Administered Date(s) Administered   Influenza,inj,Quad PF,6+ Mos 09/09/2016, 08/29/2017, 09/27/2019, 10/30/2020, 08/14/2021   Influenza-Unspecified 08/22/2013, 07/22/2014, 08/08/2015   MMR 01/22/2011   PFIZER(Purple Top)SARS-COV-2 Vaccination 12/30/2019, 01/20/2020   Tdap 01/22/2011, 04/17/2014   CT Lung Screen - not indicated. Insufficient tobacco history  Orders Placed This Encounter  Procedures   Vitamin D (25 hydroxy)     Standing Status:   Future  Number of Occurrences:   1    Standing Expiration Date:   08/27/2022   Vitamin D pnl(25-hydrxy+1,25-dihy)-bld    Standing Status:   Future    Number of Occurrences:   1    Standing Expiration Date:   08/27/2022   QuantiFERON-TB Gold Plus    Standing Status:   Future    Standing Expiration Date:   08/27/2022   Pulmonary function test    Standing Status:   Future    Standing Expiration Date:   08/27/2022    Order Specific Question:   Where should this test be performed?    Answer:   [facility]    Order Specific Question:   Full PFT: includes the following: basic spirometry, spirometry pre & post bronchodilator, diffusion capacity (DLCO), lung volumes    Answer:   Full PFT   Meds ordered this encounter  Medications   albuterol (VENTOLIN HFA) 108 (90 Base) MCG/ACT inhaler    Sig: Inhale 2 puffs into the lungs every 6 (six) hours as needed for wheezing or shortness of breath.    Dispense:  8 g    Refill:  6    Return in about 5 weeks (around 10/01/2021).  I have spent a total time of 50-minutes on the day of the appointment reviewing prior documentation, coordinating care and discussing medical diagnosis and plan with the patient/family. Imaging, labs and tests included in this note have been reviewed and interpreted independently by me.  [place], MD [facility] 08/27/2021 4:18 PM  Office Number [phone]

## 2021-08-28 LAB — VITAMIN D PNL(25-HYDRXY+1,25-DIHY)-BLD
Vit D, 1,25-Dihydroxy: 42.9 pg/mL (ref 24.8–81.5)
Vit D, 25-Hydroxy: 50.6 ng/mL (ref 30.0–100.0)

## 2021-09-07 ENCOUNTER — Other Ambulatory Visit: Payer: Self-pay | Admitting: Family Medicine

## 2021-09-07 DIAGNOSIS — R739 Hyperglycemia, unspecified: Secondary | ICD-10-CM

## 2021-09-07 DIAGNOSIS — E1165 Type 2 diabetes mellitus with hyperglycemia: Secondary | ICD-10-CM

## 2021-09-07 NOTE — Telephone Encounter (Signed)
Sent message 09/07/21

## 2021-09-08 NOTE — Telephone Encounter (Signed)
Patient scheduled appointment for 09/25/21 but is completely out of medication and needing a refill until seen

## 2021-09-10 ENCOUNTER — Emergency Department (HOSPITAL_COMMUNITY): Payer: Medicaid Other

## 2021-09-10 ENCOUNTER — Encounter (HOSPITAL_COMMUNITY): Payer: Self-pay

## 2021-09-10 ENCOUNTER — Emergency Department (HOSPITAL_COMMUNITY)
Admission: EM | Admit: 2021-09-10 | Discharge: 2021-09-10 | Disposition: A | Discharge status: Home / Self Care | Payer: Medicaid Other | Attending: Emergency Medicine | Admitting: Emergency Medicine

## 2021-09-10 ENCOUNTER — Other Ambulatory Visit: Payer: Self-pay

## 2021-09-10 DIAGNOSIS — Z7984 Long term (current) use of oral hypoglycemic drugs: Secondary | ICD-10-CM | POA: Insufficient documentation

## 2021-09-10 DIAGNOSIS — Z8616 Personal history of COVID-19: Secondary | ICD-10-CM | POA: Insufficient documentation

## 2021-09-10 DIAGNOSIS — D869 Sarcoidosis, unspecified: Secondary | ICD-10-CM | POA: Insufficient documentation

## 2021-09-10 DIAGNOSIS — Z85820 Personal history of malignant melanoma of skin: Secondary | ICD-10-CM | POA: Insufficient documentation

## 2021-09-10 DIAGNOSIS — M79604 Pain in right leg: Secondary | ICD-10-CM | POA: Diagnosis present

## 2021-09-10 DIAGNOSIS — M79605 Pain in left leg: Secondary | ICD-10-CM | POA: Insufficient documentation

## 2021-09-10 DIAGNOSIS — E119 Type 2 diabetes mellitus without complications: Secondary | ICD-10-CM | POA: Diagnosis not present

## 2021-09-10 DIAGNOSIS — Z79899 Other long term (current) drug therapy: Secondary | ICD-10-CM | POA: Diagnosis not present

## 2021-09-10 DIAGNOSIS — I10 Essential (primary) hypertension: Secondary | ICD-10-CM | POA: Insufficient documentation

## 2021-09-10 DIAGNOSIS — R52 Pain, unspecified: Secondary | ICD-10-CM

## 2021-09-10 MED ORDER — HYDROCODONE-ACETAMINOPHEN 5-325 MG PO TABS: 1.0000 | ORAL_TABLET | Freq: Four times a day (QID) | ORAL | 0 refills | Status: DC | PRN

## 2021-09-10 MED ORDER — HYDROMORPHONE HCL 2 MG/ML IJ SOLN
2.0000 mg | Freq: Once | INTRAMUSCULAR | Status: AC
  Administered 2021-09-10: 08:00:00 2 mg via INTRAMUSCULAR
  Filled 2021-09-10: qty 1

## 2021-09-10 NOTE — ED Provider Notes (Signed)
[facility] EMERGENCY DEPARTMENT Provider Note   CSN: [id number] Arrival date & time: 09/10/21  3220     History Chief Complaint  Patient presents with   Leg Pain    Courtney Hale is a 55 y.o. female.  Patient followed by pulmonary medicine as well as hematology oncology.  Patient underwent right hip biopsy due to what appeared to be metastatic bone lesions both in the sacral and pelvic area.  That biopsy was more consistent with sarcoidosis.  Not consistent with neoplastic process.  Patient being treated on prednisone by pulmonary medicine.  They may add on additional therapies.  Patient woke this morning with pain in both hips and pain in the right thigh and knee too painful to weight-bear.  Brought in by EMS.  No falls or injuries.  Patient had tramadol available in the past.  But does not have anything for pain.  Denies any incontinence or any numbness to the top or the bottom of the feet.  No pain in the leg area below the knee.      Past Medical History:  Diagnosis Date   Complication of anesthesia    woke up during endoscopy and colonoscopy   Depression    Hemorrhoids    History of esophageal stricture    2011  peptic w/  dilatation   History of gastritis    2011   History of melanoma excision    2015  left leg/   12-09-2015 back    Hypertension    Microhematuria    Migraines    Nephrolithiasis    left side non-obstructive    Right ureteral stone    Sarcoidosis 07/24/2021   Urgency of urination    Wears contact lenses     Patient Active Problem List   Diagnosis Date Noted   Sarcoidosis 08/14/2021   Left hip pain 03/09/2021   Abscess of skin 03/09/2021   Lipoma 03/09/2021   Screening for colon cancer 03/09/2021   History of COVID-19 10/30/2020   Type 2 diabetes mellitus ([place]) 09/30/2019   Rectal bleeding 04/13/2018   GERD (gastroesophageal reflux disease) 07/27/2017   Anxiety and depression 12/18/2016   Venous stasis dermatitis of both lower  extremities 06/09/2016   Diastasis of rectus abdominis 03/18/2016   IBS (irritable bowel syndrome) 03/18/2016   Melanoma of skin ([place]) 03/18/2016   Headache, chronic migraine without aura 01/31/2013   Hypertension 01/31/2013    Past Surgical History:  Procedure Laterality Date   ABDOMINAL HYSTERECTOMY     CARPAL TUNNEL RELEASE Right 01-05- 2015   COLONOSCOPY N/A 08/27/2015   Procedure: COLONOSCOPY;  Surgeon: Danie Binder, MD;  Location: [facility];  Service: Endoscopy;  Laterality: N/A;  0830   CYST EXCISION  2012     right hand   CYSTOSCOPY W/ URETERAL STENT PLACEMENT Right 12/22/2015   Procedure: CYSTOSCOPY WITH RETROGRADE PYELOGRAM/URETERAL STENT PLACEMENT;  Surgeon: Nickie Retort, MD;  Location: [facility];  Service: Urology;  Laterality: Right;   CYSTOSCOPY WITH RETROGRADE PYELOGRAM, URETEROSCOPY AND STENT PLACEMENT Right 01/05/2016   Procedure: CYSTOSCOPY WITH RETROGRADE PYELOGRAM, URETEROSCOPY AND STENT REPLACEMENT;  Surgeon: Nickie Retort, MD;  Location: [facility];  Service: Urology;  Laterality: Right;   DILATION AND CURETTAGE OF UTERUS  03-12-2009   w/  Suction   double balloon enteroscopy     Dr. Arsenio Loader at [facility]: no erosions, no evidence of Crohn's disease, path without Crohn's.    EGD with push enteroscopy  02-12-2010  patent distal peptic stricture with diffuse antral erythema, normal D1 and D2    LAPAROSCOPIC ASSISTED VAGINAL HYSTERECTOMY  10-26-2014   w/  Bilateral Salpingoophorectomy   STONE EXTRACTION WITH BASKET Right 01/05/2016   Procedure: STONE EXTRACTION WITH BASKET;  Surgeon: Nickie Retort, MD;  Location: [facility];  Service: Urology;  Laterality: Right;   TONSILLECTOMY  1998  approx     OB History   No obstetric history on file.     Family History  Problem Relation Age of Onset   Diabetes Mother    Colon cancer Neg Hx     Social History   Tobacco Use   Smoking status: Never   Smokeless tobacco:  Never  Vaping Use   Vaping Use: Never used  Substance Use Topics   Alcohol use: Yes    Comment: occassionally   Drug use: No    Home Medications Prior to Admission medications   Medication Sig Start Date End Date Taking? Authorizing Provider  Acetaminophen (TYLENOL 8 HOUR PO) Take by mouth as needed.   Yes [provider]  albuterol (VENTOLIN HFA) 108 (90 Base) MCG/ACT inhaler Inhale 2 puffs into the lungs every 6 (six) hours as needed for wheezing or shortness of breath. 08/27/21  Yes Margaretha Seeds, MD  busPIRone (BUSPAR) 5 MG tablet TAKE 1 TABLET BY MOUTH THREE TIMES DAILY 03/25/21  Yes Lovena Le, Malena M, DO  calcium carbonate (TUMS - DOSED IN MG ELEMENTAL CALCIUM) 500 MG chewable tablet Chew 1,500 mg by mouth 2 (two) times daily as needed for indigestion or heartburn.   Yes [provider]  Carboxymethylcellul-Glycerin (LUBRICATING EYE DROPS OP) Place 1 drop into both eyes daily as needed (dry eyes).   Yes [provider]  Cholecalciferol (VITAMIN D) 50 MCG (2000 UT) tablet Take 2,000 Units by mouth daily.   Yes [provider]  Chromium 1000 MCG TABS Take 1,000 mcg by mouth daily.   Yes [provider]  CINNAMON PO Take 2,000 mg by mouth daily.   Yes [provider]  Cyanocobalamin (B-12) 2500 MCG TABS Take 2,500 mcg by mouth daily.   Yes [provider]  hydrochlorothiazide (HYDRODIURIL) 25 MG tablet Take 1 tablet by mouth once daily 02/20/21  Yes Lovena Le, Malena M, DO  HYDROcodone-acetaminophen (NORCO/VICODIN) 5-325 MG tablet Take 1 tablet by mouth every 6 (six) hours as needed for moderate pain. 09/10/21  Yes Fredia Sorrow, MD  ibuprofen (ADVIL) 200 MG tablet Take 600 mg by mouth every 6 (six) hours as needed for moderate pain or headache.   Yes [provider]  losartan (COZAAR) 100 MG tablet TAKE 1 TABLET BY MOUTH ONCE DAILY. NEEDS LABS AND APPT 08/17/21  Yes Lacinda Axon, Jayce G, DO  meclizine (ANTIVERT) 25 MG tablet  Take 25 mg by mouth 3 (three) times daily as needed for dizziness.   Yes [provider]  meloxicam (MOBIC) 7.5 MG tablet TAKE 1 TABLET BY MOUTH ONCE DAILY AS NEEDED MIGRAINE 12/18/20  Yes Lovena Le, Malena M, DO  metFORMIN (GLUCOPHAGE) 850 MG tablet TAKE 1 TABLET BY MOUTH TWICE DAILY WITH A MEAL 09/08/21  Yes Cook, Jayce G, DO  omeprazole (PRILOSEC) 20 MG capsule Take 1 capsule (20 mg total) by mouth daily. 08/26/21 09/25/21 Yes Marcello Fennel, PA-C  PARoxetine (PAXIL) 40 MG tablet TAKE 1 TABLET BY MOUTH ONCE DAILY IN THE MORNING 08/17/21  Yes Cook, Jayce G, DO  predniSONE (DELTASONE) 10 MG tablet 2 daily until feeling better then  1 daily with breakfast 08/14/21  Yes Tanda Rockers, MD  Probiotic Product (PROBIOTIC PO) Take 1 capsule by mouth daily.   Yes [provider]  prochlorperazine (COMPAZINE) 10 MG tablet Take 1 tablet (10 mg total) by mouth every 6 (six) hours as needed for nausea or vomiting. 07/13/21  Yes Derek Jack, MD  rizatriptan (MAXALT-MLT) 10 MG disintegrating tablet Take 1 tablet (10 mg total) by mouth as needed for migraine. May repeat in 2 hours if needed. Max 2 per 24 hours 05/22/20  Yes Taylor, Malena M, DO  traMADol (ULTRAM) 50 MG tablet Take 1 tablet (50 mg total) by mouth every 6 (six) hours as needed. 07/13/21  Yes Derek Jack, MD  traZODone (DESYREL) 50 MG tablet TAKE 1 TO 2 TABLETS BY MOUTH EVERY DAY AT BEDTIME AS NEEDED FOR INSOMNIA Patient taking differently: 100 mg. TAKE 1 TO 2 TABLETS BY MOUTH EVERY DAY AT BEDTIME AS NEEDED FOR INSOMNIA 08/17/21  Yes Cook, Jayce G, DO  valACYclovir (VALTREX) 500 MG tablet Take 500 mg by mouth 2 (two) times daily as needed (outbreaks). 06/05/21  Yes [provider]    Allergies    Topamax [topiramate] and Sulfa antibiotics  Review of Systems   Review of Systems  Constitutional:  Negative for chills and fever.  HENT:  Negative for ear pain and sore throat.   Eyes:  Negative for pain and  visual disturbance.  Respiratory:  Negative for cough and shortness of breath.   Cardiovascular:  Negative for chest pain and palpitations.  Gastrointestinal:  Negative for abdominal pain and vomiting.  Genitourinary:  Negative for dysuria and hematuria.  Musculoskeletal:  Positive for arthralgias and back pain. Negative for joint swelling.  Skin:  Negative for color change and rash.  Neurological:  Negative for seizures and syncope.  All other systems reviewed and are negative.  Physical Exam Updated Vital Signs BP 116/90 (BP Location: Left Arm)   Pulse 74   Temp 98.3 F (36.8 C) (Oral)   Resp 17   Ht 1.6 m (5\' 3" )   Wt 80.3 kg   LMP 10/26/2014   SpO2 96%   BMI 31.35 kg/m   Physical Exam Vitals and nursing note reviewed.  Constitutional:      General: She is not in acute distress.    Appearance: Normal appearance. She is well-developed.  HENT:     Head: Normocephalic and atraumatic.  Eyes:     Extraocular Movements: Extraocular movements intact.     Conjunctiva/sclera: Conjunctivae normal.     Pupils: Pupils are equal, round, and reactive to light.  Cardiovascular:     Rate and Rhythm: Normal rate and regular rhythm.     Heart sounds: No murmur heard. Pulmonary:     Effort: Pulmonary effort is normal. No respiratory distress.     Breath sounds: Normal breath sounds.  Abdominal:     Palpations: Abdomen is soft.     Tenderness: There is no abdominal tenderness.  Musculoskeletal:        General: No swelling.     Cervical back: Normal range of motion and neck supple.     Comments: Good cap refill to bilateral feet.  Patient able to move foot ankle and knee just has pain with weightbearing.  No weakness.  No sensory deficit.  Skin:    General: Skin is warm and dry.     Capillary Refill: Capillary refill takes less than 2 seconds.  Neurological:     General: No focal  deficit present.     Mental Status: She is alert and oriented to person, place, and time.     Sensory:  No sensory deficit.     Motor: No weakness.  Psychiatric:        Mood and Affect: Mood normal.    ED Results / Procedures / Treatments   Labs (all labs ordered are listed, but only abnormal results are displayed) Labs Reviewed - No data to display  EKG None  Radiology DG HIP UNILAT WITH PELVIS 2-3 VIEWS LEFT  Result Date: 09/10/2021 CLINICAL DATA:  Bilateral Hip, Right thigh pain. Hx of Bone lesions 2nd to Sarcoidosis not neoplastic EXAM: DG HIP (WITH OR WITHOUT PELVIS) 2-3V LEFT COMPARISON:  None. FINDINGS: Normal alignment. No acute fracture. No significant degenerative changes of the hips. No focal bone lesion. The soft tissues are unremarkable. IMPRESSION: Unremarkable radiograph of the pelvis.  No focal bone lesion. Electronically Signed   By: Albin Felling M.D.   On: 09/10/2021 08:47   DG Femur Min 2 Views Right  Result Date: 09/10/2021 CLINICAL DATA:  Patient with complaint of bilateral hip pain. Patient with complaint of right thigh pain. History of bony lesions based on biopsy secondary to sarcoidosis. Not neoplastic. EXAM: RIGHT FEMUR 2 VIEWS COMPARISON:  None. FINDINGS: Normal alignment. No acute fracture. No focal lesion. The soft tissues are unremarkable. IMPRESSION: Unremarkable radiograph of the femur.  No focal osseous lesion. Electronically Signed   By: Albin Felling M.D.   On: 09/10/2021 08:49    Procedures Procedures   Medications Ordered in ED Medications  HYDROmorphone (DILAUDID) injection 2 mg (2 mg Intramuscular Given 09/10/21 7001)    ED Course  I have reviewed the triage vital signs and the nursing notes.  Pertinent labs & imaging results that were available during my care of the patient were reviewed by me and considered in my medical decision making (see chart for details).    MDM Rules/Calculators/A&P                           Symptoms most likely related to her sarcoidosis bony lesions.  But will get x-ray of bilateral hips and pelvis and  x-ray of the right femur just to rule out any pathological fracture.  Also give IM hydromorphone. Patient with some improvement in the pain.  X-rays of the bilateral hips pelvis and right femur without any significant abnormalities.  No focal osseous lesions.    Final Clinical Impression(s) / ED Diagnoses Final diagnoses:  Bilateral leg pain  Sarcoidosis    Rx / DC Orders ED Discharge Orders          Ordered    HYDROcodone-acetaminophen (NORCO/VICODIN) 5-325 MG tablet  Every 6 hours PRN        09/10/21 0937             Fredia Sorrow, MD 09/10/21 [phone]

## 2021-09-10 NOTE — ED Triage Notes (Signed)
Pt bib ems from home for bilateral leg pain.  Pain is worse in right legs.  Pain start in kn[place] and goes down and up legs.  Denies any acute injury.  Denies any increased of physical activity.  Reports that she is able to walk but has pain.  Yesterday pt was fine.

## 2021-09-10 NOTE — Discharge Instructions (Addendum)
Symptoms most likely secondary to your sarcoidosis.  Take pain medication as directed.  Follow-up with your pulmonary doctors and your regular doctors.  X-rays today without evidence of any fracture or bony injury.

## 2021-09-11 ENCOUNTER — Telehealth: Payer: Self-pay | Admitting: Pulmonary Disease

## 2021-09-11 NOTE — Telephone Encounter (Signed)
I have called the pt and advised her that of right now we will keep the appt for 12/8.  I will send a message over to [place] to advise if she wants to see her before that appt.

## 2021-09-25 ENCOUNTER — Ambulatory Visit: Payer: Medicaid Other | Admitting: Family Medicine

## 2021-09-25 ENCOUNTER — Other Ambulatory Visit: Payer: Self-pay

## 2021-09-25 ENCOUNTER — Other Ambulatory Visit: Payer: Self-pay | Admitting: Family Medicine

## 2021-09-25 VITALS — BP 113/79 | HR 89 | Temp 97.5°F | Ht 63.0 in | Wt 186.0 lb

## 2021-09-25 DIAGNOSIS — E785 Hyperlipidemia, unspecified: Secondary | ICD-10-CM

## 2021-09-25 DIAGNOSIS — F419 Anxiety disorder, unspecified: Secondary | ICD-10-CM | POA: Diagnosis not present

## 2021-09-25 DIAGNOSIS — G4700 Insomnia, unspecified: Secondary | ICD-10-CM

## 2021-09-25 DIAGNOSIS — D869 Sarcoidosis, unspecified: Secondary | ICD-10-CM | POA: Diagnosis not present

## 2021-09-25 DIAGNOSIS — E1165 Type 2 diabetes mellitus with hyperglycemia: Secondary | ICD-10-CM

## 2021-09-25 DIAGNOSIS — F32A Depression, unspecified: Secondary | ICD-10-CM | POA: Diagnosis not present

## 2021-09-25 DIAGNOSIS — I10 Essential (primary) hypertension: Secondary | ICD-10-CM | POA: Diagnosis not present

## 2021-09-25 MED ORDER — HYDROCHLOROTHIAZIDE 25 MG PO TABS: 25.0000 mg | ORAL_TABLET | Freq: Every day | ORAL | 1 refills | Status: DC

## 2021-09-25 MED ORDER — LOSARTAN POTASSIUM 100 MG PO TABS: ORAL_TABLET | ORAL | 1 refills | Status: DC

## 2021-09-25 MED ORDER — METFORMIN HCL 850 MG PO TABS: 850.0000 mg | ORAL_TABLET | Freq: Two times a day (BID) | ORAL | 1 refills | Status: DC

## 2021-09-25 MED ORDER — PAROXETINE HCL 40 MG PO TABS: 40.0000 mg | ORAL_TABLET | Freq: Every morning | ORAL | 1 refills | Status: DC

## 2021-09-25 MED ORDER — TRAZODONE HCL 100 MG PO TABS: 100.0000 mg | ORAL_TABLET | Freq: Every evening | ORAL | 1 refills | Status: DC | PRN

## 2021-09-25 NOTE — Patient Instructions (Signed)
I am going to talk to Dr. Raliegh Ip.  Labs today.  I will figure out follow up after I speak with him and your labs return.  Take care  Dr. Lacinda Axon

## 2021-09-25 NOTE — Assessment & Plan Note (Signed)
Blood pressure well controlled.  Continue losartan and HCTZ.

## 2021-09-25 NOTE — Progress Notes (Signed)
Subjective:  Patient ID: Courtney Hale, female    DOB: 1965/11/21  Age: 55 y.o. MRN: [id number]  CC: Chief Complaint  Patient presents with   Establish Care   sarcadosis    Dx appt w/ pulm 12/6 Lesions L side face- pain and swelling     HPI:  55 year old female with a fairly recent diagnosis of sarcoidosis, type 2 diabetes, hypertension, migraine presents for follow-up and to establish care with me.  Sarcoidosis Recent diagnosis.  Confirmed with biopsy of iliac bone (revealed noncaseating granulomatous inflammation). She has involvement of the pelvis, frontal bone, mediastinal lymph nodes. She has seen pulmonology recently.  She is scheduled for pulmonary function tests it is clearly old 20 mg of prednisone daily. Patient states that she is unsure if the prednisone is helping.  She continues to have pain of the left hip.  Also reports pain of the frontal region. Patient is very concerned about her ongoing issues.  She is having significant hyperglycemia associated with the prednisone.  Type 2 diabetes Last A1c was in April.  She was well controlled at time. However, due to ongoing medical issues and steroid use her blood sugars have been uncontrolled.  She states that she sees readings of 400 regularly. Needs A1c today.  She is currently on 850 mg of metformin twice daily.  Hypertension Well-controlled.  She is currently on losartan 100 mg daily, hydrochlorothiazide 25 mg daily.  Patient Active Problem List   Diagnosis Date Noted   Sarcoidosis 08/14/2021   Lipoma 03/09/2021   Type 2 diabetes mellitus ([place]) 09/30/2019   GERD (gastroesophageal reflux disease) 07/27/2017   Anxiety and depression 12/18/2016   Venous stasis dermatitis of both lower extremities 06/09/2016   IBS (irritable bowel syndrome) 03/18/2016   Melanoma of skin ([place]) 03/18/2016   Headache, chronic migraine without aura 01/31/2013   Hypertension 01/31/2013    Social Hx   Social History    Socioeconomic History   Marital status: Single    Spouse name: Not on file   Number of children: Not on file   Years of education: Not on file   Highest education level: Not on file  Occupational History   Not on file  Tobacco Use   Smoking status: Never   Smokeless tobacco: Never  Vaping Use   Vaping Use: Never used  Substance and Sexual Activity   Alcohol use: Yes    Comment: occassionally   Drug use: No   Sexual activity: Not on file  Other Topics Concern   Not on file  Social History Narrative   Not on file   Social Determinants of Health   Financial Resource Strain: Low Risk    Difficulty of Paying Living Expenses: Not hard at all  Food Insecurity: No Food Insecurity   Worried About Charity fundraiser in the Last Year: Never true   Ran Out of Food in the Last Year: Never true  Transportation Needs: No Transportation Needs   Lack of Transportation (Medical): No   Lack of Transportation (Non-Medical): No  Physical Activity: Inactive   Days of Exercise per Week: 0 days   Minutes of Exercise per Session: 0 min  Stress: Stress Concern Present   Feeling of Stress : Very much  Social Connections: Socially Isolated   Frequency of Communication with Friends and Family: More than three times a week   Frequency of Social Gatherings with Friends and Family: More than three times a week   Attends Religious Services: Never  Active Member of Clubs or Organizations: No   Attends Archivist Meetings: Never   Marital Status: Never married    Review of Systems  Musculoskeletal:        Hip pain.   Neurological:  Positive for headaches.    Objective:  BP 113/79   Pulse 89   Temp (!) 97.5 F (36.4 C)   Ht 5\' 3"  (1.6 m)   Wt 186 lb (84.4 kg)   LMP 10/26/2014   SpO2 96%   BMI 32.95 kg/m   BP/Weight 09/25/2021 09/10/2021 76/04/2093  Systolic BP 709 628 366  Diastolic BP 79 75 80  Wt. (Lbs) 186 177 184  BMI 32.95 31.35 32.08    Physical  Exam Constitutional:      General: She is not in acute distress.    Appearance: Normal appearance. She is obese. She is not ill-appearing.  HENT:     Head: Normocephalic and atraumatic.  Eyes:     General:        Right eye: No discharge.        Left eye: No discharge.     Conjunctiva/sclera: Conjunctivae normal.  Cardiovascular:     Rate and Rhythm: Normal rate and regular rhythm.     Heart sounds: No murmur heard. Pulmonary:     Effort: Pulmonary effort is normal.     Breath sounds: Normal breath sounds. No wheezing or rales.  Neurological:     Mental Status: She is alert.  Psychiatric:        Mood and Affect: Mood normal.        Behavior: Behavior normal.    Lab Results  Component Value Date   WBC 11.7 (H) 08/26/2021   HGB 13.7 08/26/2021   HCT 39.8 08/26/2021   PLT 304 08/26/2021   GLUCOSE 198 (H) 08/26/2021   CHOL 258 (H) 09/13/2019   TRIG 166 (H) 09/13/2019   HDL 45 09/13/2019   LDLCALC 182 (H) 09/13/2019   ALT 36 08/26/2021   AST 24 08/26/2021   NA 135 08/26/2021   K 3.7 08/26/2021   CL 101 08/26/2021   CREATININE 0.66 08/26/2021   BUN 18 08/26/2021   CO2 24 08/26/2021   TSH 0.996 08/27/2020   INR 0.9 07/21/2021   HGBA1C 6.6 (H) 01/26/2021     Assessment & Plan:   Problem List Items Addressed This Visit       Cardiovascular and Mediastinum   Hypertension    Blood pressure well controlled.  Continue losartan and HCTZ.      Relevant Medications   hydrochlorothiazide (HYDRODIURIL) 25 MG tablet   losartan (COZAAR) 100 MG tablet     Endocrine   Type 2 diabetes mellitus (HCC)    Uncontrolled.  Awaiting on A1c.  Continue metformin.  We will add additional therapy after A1c has returned.      Relevant Medications   metFORMIN (GLUCOPHAGE) 850 MG tablet   losartan (COZAAR) 100 MG tablet   Other Relevant Orders   Hemoglobin A1c     Other   Anxiety and depression   Relevant Medications   PARoxetine (PAXIL) 40 MG tablet   traZODone (DESYREL) 100  MG tablet   Sarcoidosis - Primary    I discussed her case with her hematologist/oncologist as well as her pulmonologist.  I spoke directly with the pulmonologist over the phone.  After talking with the patient, she would like to proceed with referral to [facility] sarcoidosis clinic.  We will place urgent referral.  Other Visit Diagnoses     Hyperlipidemia, unspecified hyperlipidemia type       Relevant Medications   hydrochlorothiazide (HYDRODIURIL) 25 MG tablet   losartan (COZAAR) 100 MG tablet   Other Relevant Orders   Lipid panel   Insomnia, unspecified type       Relevant Medications   traZODone (DESYREL) 100 MG tablet       Meds ordered this encounter  Medications   hydrochlorothiazide (HYDRODIURIL) 25 MG tablet    Sig: Take 1 tablet (25 mg total) by mouth daily.    Dispense:  90 tablet    Refill:  1   metFORMIN (GLUCOPHAGE) 850 MG tablet    Sig: Take 1 tablet (850 mg total) by mouth 2 (two) times daily with a meal.    Dispense:  180 tablet    Refill:  1   PARoxetine (PAXIL) 40 MG tablet    Sig: Take 1 tablet (40 mg total) by mouth every morning.    Dispense:  90 tablet    Refill:  1   losartan (COZAAR) 100 MG tablet    Sig: TAKE 1 TABLET BY MOUTH ONCE DAILY.    Dispense:  90 tablet    Refill:  1   traZODone (DESYREL) 100 MG tablet    Sig: Take 1 tablet (100 mg total) by mouth at bedtime as needed for sleep.    Dispense:  90 tablet    Refill:  1    Follow-up:  Pending lab results  [place]

## 2021-09-25 NOTE — Assessment & Plan Note (Signed)
Uncontrolled.  Awaiting on A1c.  Continue metformin.  We will add additional therapy after A1c has returned.

## 2021-09-25 NOTE — Assessment & Plan Note (Signed)
I discussed her case with her hematologist/oncologist as well as her pulmonologist.  I spoke directly with the pulmonologist over the phone.  After talking with the patient, she would like to proceed with referral to [facility] sarcoidosis clinic.  We will place urgent referral.

## 2021-09-26 LAB — LIPID PANEL
Chol/HDL Ratio: 4.4 ratio (ref 0.0–4.4)
Cholesterol, Total: 258 mg/dL — ABNORMAL HIGH (ref 100–199)
HDL: 59 mg/dL (ref >39)
LDL Chol Calc (NIH): 168 mg/dL — ABNORMAL HIGH (ref 0–99)
Triglycerides: 169 mg/dL — ABNORMAL HIGH (ref 0–149)
VLDL Cholesterol Cal: 31 mg/dL (ref 5–40)

## 2021-09-26 LAB — HEMOGLOBIN A1C
Est. average glucose Bld gHb Est-mCnc: 180 mg/dL
Hgb A1c MFr Bld: 7.9 % — ABNORMAL HIGH (ref 4.8–5.6)

## 2021-09-29 DIAGNOSIS — C44612 Basal cell carcinoma of skin of right upper limb, including shoulder: Secondary | ICD-10-CM | POA: Diagnosis not present

## 2021-09-29 DIAGNOSIS — D1801 Hemangioma of skin and subcutaneous tissue: Secondary | ICD-10-CM | POA: Diagnosis not present

## 2021-09-29 DIAGNOSIS — D485 Neoplasm of uncertain behavior of skin: Secondary | ICD-10-CM | POA: Diagnosis not present

## 2021-09-29 DIAGNOSIS — L814 Other melanin hyperpigmentation: Secondary | ICD-10-CM | POA: Diagnosis not present

## 2021-09-29 DIAGNOSIS — L821 Other seborrheic keratosis: Secondary | ICD-10-CM | POA: Diagnosis not present

## 2021-09-30 ENCOUNTER — Other Ambulatory Visit: Payer: Self-pay

## 2021-09-30 ENCOUNTER — Ambulatory Visit (INDEPENDENT_AMBULATORY_CARE_PROVIDER_SITE_OTHER): Payer: Medicaid Other | Admitting: Pulmonary Disease

## 2021-09-30 DIAGNOSIS — D869 Sarcoidosis, unspecified: Secondary | ICD-10-CM | POA: Diagnosis not present

## 2021-09-30 LAB — PULMONARY FUNCTION TEST
DL/VA % pred: 134 %
DL/VA: 5.8 ml/min/mmHg/L
DLCO cor % pred: 111 %
DLCO cor: 22.71 ml/min/mmHg
DLCO unc % pred: 112 %
DLCO unc: 22.92 ml/min/mmHg
FEF 25-75 Post: 3.08 L/sec
FEF 25-75 Pre: 2.94 L/sec
FEF2575-%Change-Post: 4 %
FEF2575-%Pred-Post: 118 %
FEF2575-%Pred-Pre: 112 %
FEV1-%Change-Post: 2 %
FEV1-%Pred-Post: 81 %
FEV1-%Pred-Pre: 79 %
FEV1-Post: 2.2 L
FEV1-Pre: 2.15 L
FEV1FVC-%Change-Post: 0 %
FEV1FVC-%Pred-Pre: 107 %
FEV6-%Change-Post: 2 %
FEV6-%Pred-Post: 77 %
FEV6-%Pred-Pre: 75 %
FEV6-Post: 2.56 L
FEV6-Pre: 2.49 L
FEV6FVC-%Change-Post: 0 %
FEV6FVC-%Pred-Post: 102 %
FEV6FVC-%Pred-Pre: 102 %
FVC-%Change-Post: 2 %
FVC-%Pred-Post: 74 %
FVC-%Pred-Pre: 73 %
FVC-Post: 2.56 L
FVC-Pre: 2.5 L
Post FEV1/FVC ratio: 86 %
Post FEV6/FVC ratio: 100 %
Pre FEV1/FVC ratio: 86 %
Pre FEV6/FVC Ratio: 100 %
RV % pred: 54 %
RV: 0.99 L
TLC % pred: 71 %
TLC: 3.55 L

## 2021-09-30 NOTE — Progress Notes (Signed)
PFT done today. 

## 2021-10-01 ENCOUNTER — Ambulatory Visit: Payer: Medicaid Other | Admitting: Pulmonary Disease

## 2021-10-01 ENCOUNTER — Encounter: Payer: Self-pay | Admitting: Pulmonary Disease

## 2021-10-01 VITALS — BP 102/74 | HR 86 | Temp 98.1°F | Ht 63.0 in | Wt 188.0 lb

## 2021-10-01 DIAGNOSIS — D869 Sarcoidosis, unspecified: Secondary | ICD-10-CM

## 2021-10-01 MED ORDER — PREDNISONE 10 MG PO TABS: 40.0000 mg | ORAL_TABLET | Freq: Every day | ORAL | 0 refills | Status: DC

## 2021-10-01 MED ORDER — OMEPRAZOLE 40 MG PO CPDR: 40.0000 mg | DELAYED_RELEASE_CAPSULE | Freq: Every day | ORAL | 3 refills | Status: DC

## 2021-10-01 NOTE — Progress Notes (Signed)
Subjective:   PATIENT ID: Benard Rink GENDER: female DOB: September 17, 1966, MRN: [id number]   HPI  Chief Complaint  Patient presents with   Follow-up    Patient is here to go over test results.   Reason for Visit: Follow-up  [name] is a 55 year old female with history of melanoma s/p skin resections with DM2 who presents for sarcoidosis.  Synopsis: She was initially referred to [facility] shortness of breath with exertion and at rest.  She was seen by Dr. Melvyn Novas for sarcoid. She has been followed by Dr. Delton Coombes in hematology for multiple bone lesions after presenting for left hip pain since February 2022. PET demonstrated hypermetabolic mediastinal lymphadenopathy and right iliac, right sacral ala and anterior left frontal bone lesions. She underwent bone biopsy of right iliac bone lesion on 07/21/2021 which returned with noncaseating granulomatous inflammation. Dr. Melvyn Novas started patient on 20 mg prednisone and transferred to my care in November 2022.  08/27/21 Yesterday she presented to the ED for acute on chronic atypical chest pain that was worsening.  EKG negative for ischemia.  ED PA evaluated and had low suspicion for ACS.  Started on PPI for reflux and referred to GI. Since starting the steroids she reports she is unsure if it has been effective. She has shortness of breath with panic attacks. Sometimes unable to take a deep breath. Occasional dry cough.  10/01/21 She reports that she is unclear if her current steroid dose is helping. She has used her inhaler for shortness of breath and uses albuterol 2-3 times a day for two days a week. She continues to have left hip pain and left head pain. Right side doesn't bother her. She reports seeing out of her left eye with blurriness and double vision at times which she attributes to facial swelling and pain.   Past Medical History:  Diagnosis Date   Complication of anesthesia    woke up during endoscopy and colonoscopy    Depression    Hemorrhoids    History of esophageal stricture    2011  peptic w/  dilatation   History of gastritis    2011   History of melanoma excision    2015  left leg/   12-09-2015 back    Hypertension    Microhematuria    Migraines    Nephrolithiasis    left side non-obstructive    Right ureteral stone    Sarcoidosis 07/24/2021   Urgency of urination    Wears contact lenses      Family History  Problem Relation Age of Onset   Diabetes Mother    Colon cancer Neg Hx      Social History   Occupational History   Not on file  Tobacco Use   Smoking status: Never   Smokeless tobacco: Never  Vaping Use   Vaping Use: Never used  Substance and Sexual Activity   Alcohol use: Yes    Comment: occassionally   Drug use: No   Sexual activity: Not on file    Allergies  Allergen Reactions   Topamax [Topiramate] Other (See Comments)    Developed kidney stones   Sulfa Antibiotics Other (See Comments)    Burning sensation, flushing to skin     Outpatient Medications Prior to Visit  Medication Sig Dispense Refill   Acetaminophen (TYLENOL 8 HOUR PO) Take by mouth as needed.     albuterol (VENTOLIN HFA) 108 (90 Base) MCG/ACT inhaler Inhale 2 puffs into the lungs every  6 (six) hours as needed for wheezing or shortness of breath. 8 g 6   busPIRone (BUSPAR) 5 MG tablet TAKE 1 TABLET BY MOUTH THREE TIMES DAILY 45 tablet 2   calcium carbonate (TUMS - DOSED IN MG ELEMENTAL CALCIUM) 500 MG chewable tablet Chew 1,500 mg by mouth 2 (two) times daily as needed for indigestion or heartburn.     Carboxymethylcellul-Glycerin (LUBRICATING EYE DROPS OP) Place 1 drop into both eyes daily as needed (dry eyes).     cetirizine (ZYRTEC) 10 MG chewable tablet Chew 10 mg by mouth daily.     Cholecalciferol (VITAMIN D) 50 MCG (2000 UT) tablet Take 2,000 Units by mouth daily.     Chromium 1000 MCG TABS Take 1,000 mcg by mouth daily.     CINNAMON PO Take 2,000 mg by mouth daily.      Cyanocobalamin (B-12) 2500 MCG TABS Take 2,500 mcg by mouth daily.     hydrochlorothiazide (HYDRODIURIL) 25 MG tablet Take 1 tablet (25 mg total) by mouth daily. 90 tablet 1   HYDROcodone-acetaminophen (NORCO/VICODIN) 5-325 MG tablet Take 1 tablet by mouth every 6 (six) hours as needed for moderate pain. 14 tablet 0   losartan (COZAAR) 100 MG tablet TAKE 1 TABLET BY MOUTH ONCE DAILY. 90 tablet 1   meclizine (ANTIVERT) 25 MG tablet Take 25 mg by mouth 3 (three) times daily as needed for dizziness.     meloxicam (MOBIC) 7.5 MG tablet TAKE 1 TABLET BY MOUTH ONCE DAILY AS NEEDED MIGRAINE 30 tablet 2   metFORMIN (GLUCOPHAGE) 850 MG tablet Take 1 tablet (850 mg total) by mouth 2 (two) times daily with a meal. 180 tablet 1   omeprazole (PRILOSEC) 20 MG capsule Take 1 capsule (20 mg total) by mouth daily. 30 capsule 0   ondansetron (ZOFRAN-ODT) 4 MG disintegrating tablet Take 4 mg by mouth every 8 (eight) hours as needed for nausea or vomiting.     PARoxetine (PAXIL) 40 MG tablet Take 1 tablet (40 mg total) by mouth every morning. 90 tablet 1   predniSONE (DELTASONE) 10 MG tablet 2 daily until feeling better then 1 daily with breakfast 100 tablet 0   Probiotic Product (PROBIOTIC PO) Take 1 capsule by mouth daily.     prochlorperazine (COMPAZINE) 10 MG tablet Take 1 tablet (10 mg total) by mouth every 6 (six) hours as needed for nausea or vomiting. 30 tablet 2   rizatriptan (MAXALT-MLT) 10 MG disintegrating tablet Take 1 tablet (10 mg total) by mouth as needed for migraine. May repeat in 2 hours if needed. Max 2 per 24 hours 10 tablet 1   traZODone (DESYREL) 100 MG tablet Take 1 tablet (100 mg total) by mouth at bedtime as needed for sleep. 90 tablet 1   valACYclovir (VALTREX) 500 MG tablet Take 500 mg by mouth 2 (two) times daily as needed (outbreaks).     No facility-administered medications prior to visit.    Review of Systems  Constitutional:  Negative for chills, diaphoresis, fever, malaise/fatigue  and weight loss.  HENT:  Negative for congestion.   Eyes:  Positive for blurred vision and double vision.  Respiratory:  Positive for shortness of breath. Negative for cough, hemoptysis, sputum production and wheezing.   Cardiovascular:  Negative for chest pain, palpitations and leg swelling.  Musculoskeletal:  Positive for joint pain.    Objective:   Vitals:   10/01/21 1039  BP: 102/74  Pulse: 86  Temp: 98.1 F (36.7 C)  TempSrc: Oral  SpO2:  95%  Weight: 188 lb (85.3 kg)  Height: 5' 3" (1.6 m)    Physical Exam: General: Well-appearing, no acute distress HENT: , AT Eyes: EOMI, no scleral icterus Respiratory: Clear to auscultation bilaterally.  No crackles, wheezing or rales Cardiovascular: RRR, -M/R/G, no JVD Extremities:-Edema,-tenderness Neuro: AAO x4, CNII-XII grossly intact Psych: Normal mood, normal affect  Data Reviewed:  Imaging: PET 07/09/21 - Hypermetabolic bilateral hilar and mediastinal lymph nodules, right ilac lesion, right sacral ala, right scapular spine, left scapular spine and left frontal bone  PFT: 09/30/21 FVC 2.56 (73%) FEV1 2.20 (81%) Ratio 86  TLC 71% DLCO 112% Interpretation: Mild restrictive defect with normal DLCO.  Labs: CMP Latest Ref Rng & Units 08/26/2021 06/30/2021 01/26/2021  Glucose 70 - 99 mg/dL 198(H) 82 105(H)  BUN 6 - 20 mg/dL 18 12 11  Creatinine 0.44 - 1.00 mg/dL 0.66 0.59 0.56(L)  Sodium 135 - 145 mmol/L 135 139 143  Potassium 3.5 - 5.1 mmol/L 3.7 3.4(L) 4.0  Chloride 98 - 111 mmol/L 101 107 102  CO2 22 - 32 mmol/L 24 24 24  Calcium 8.9 - 10.3 mg/dL 9.4 9.0 9.5  Total Protein 6.5 - 8.1 g/dL 6.8 6.7 7.1  Total Bilirubin 0.3 - 1.2 mg/dL 0.4 0.3 0.3  Alkaline Phos 38 - 126 U/L 76 73 97  AST 15 - 41 U/L 24 20 21  ALT 0 - 44 U/L 36 24 28   CBC    Component Value Date/Time   WBC 11.7 (H) 08/26/2021 1253   RBC 4.43 08/26/2021 1253   HGB 13.7 08/26/2021 1253   HGB 14.2 01/26/2021 1429   HCT 39.8 08/26/2021 1253   HCT 41.7  01/26/2021 1429   PLT 304 08/26/2021 1253   PLT 320 01/26/2021 1429   MCV 89.8 08/26/2021 1253   MCV 87 01/26/2021 1429   MCH 30.9 08/26/2021 1253   MCHC 34.4 08/26/2021 1253   RDW 13.1 08/26/2021 1253   RDW 12.8 01/26/2021 1429   LYMPHSABS 3.2 06/30/2021 1608   LYMPHSABS 3.7 (H) 01/26/2021 1429   MONOABS 0.6 06/30/2021 1608   EOSABS 0.2 06/30/2021 1608   EOSABS 0.1 01/26/2021 1429   BASOSABS 0.0 06/30/2021 1608   BASOSABS 0.0 01/26/2021 1429   Mild leukocytosis in setting of steroid use  Normal LFTs  08/14/21 ACE 33 ESR  20    Assessment & Plan:   Discussion: 55 year old female with sarcoid, hx of melanoma s/p skin resections, DM2 who presents for follow-up of sarcoidosis with bone involvement.  We discussed the clinical course of sarcoid and management including serial PFTs, labs, eye exam, and EKG and chest imaging if indicated. If symptoms suggest sarcoid flare in the future, we would manage with steroids +/- biologics.  She reports ocular symptoms and some shortness of breath. Will increase prednisone to 40 mg for potential flare involving lungs and eyes. She will need close management of her hyperglycemia.  Pulmonary sarcoidosis with bone involvement - Dx in 06/2021 via right iliac bone biopsy - Immunosuppression with prednisone - Annual PFTs.  Last PFTs - 09/2021 - Recommend annual ophthalmology exam  - EKG reviewed. No evidence of conduction abnormalities.   Pulmonary sarcoid with restrictive defect Shortness of breath --CONTINUE Albuterol TWO puffs as needed for shortness of breath  Chronic immunosuppression --08/14/21 Treated with prednisone 20 mg ~8 weeks --10/01/21 Increase prednisone 40 mg. Will message PCP regarding hyperglycemia --If responsive in the next month, will initiate methotrexate --CONTINUE omeprazole 20 mg daily --Will need QuantiFERON-TB   in future  Bone sarcoid involvement --Immunosuppression as above  Health Maintenance Immunization  History  Administered Date(s) Administered   Influenza,inj,Quad PF,6+ Mos 09/09/2016, 08/29/2017, 09/27/2019, 10/30/2020, 08/14/2021   Influenza-Unspecified 08/22/2013, 07/22/2014, 08/08/2015   MMR 01/22/2011   PFIZER(Purple Top)SARS-COV-2 Vaccination 12/30/2019, 01/20/2020   Tdap 01/22/2011, 04/17/2014   CT Lung Screen - not indicated. Insufficient tobacco history  No orders of the defined types were placed in this encounter.  Meds ordered this encounter  Medications   omeprazole (PRILOSEC) 40 MG capsule    Sig: Take 1 capsule (40 mg total) by mouth daily.    Dispense:  30 capsule    Refill:  3   predniSONE (DELTASONE) 10 MG tablet    Sig: Take 4 tablets (40 mg total) by mouth daily with breakfast.    Dispense:  168 tablet    Refill:  0    Return in about 6 weeks (around 11/10/2021).  I have spent a total time of 39-minutes on the day of the appointment reviewing prior documentation, coordinating care and discussing medical diagnosis and plan with the patient/family. Past medical history, allergies, medications were reviewed. Pertinent imaging, labs and tests included in this note have been reviewed and interpreted independently by me.   Jane , MD [facility] 10/01/2021 10:35 AM  Office Number [phone]    

## 2021-10-01 NOTE — Patient Instructions (Addendum)
Sarcoidosis with flare - Recommend annual ophthalmology exam   Pulmonary sarcoid with restrictive defect Shortness of breath --CONTINUE Albuterol TWO puffs as needed for shortness of breath  Chronic immunosuppression --08/14/21 Prednisone 20 mg ~8 weeks --10/01/21 Increase prednisone 40 mg. Will message PCP regarding hyperglycemia --If responsive in the next month, will initiate methotrexate --CONTINUE omeprazole 20 mg daily  Follow-up with me in one month

## 2021-10-02 ENCOUNTER — Ambulatory Visit: Payer: Medicaid Other | Admitting: Family Medicine

## 2021-10-02 ENCOUNTER — Telehealth: Payer: Self-pay | Admitting: Family Medicine

## 2021-10-02 ENCOUNTER — Other Ambulatory Visit: Payer: Self-pay

## 2021-10-02 VITALS — BP 118/82 | HR 86 | Temp 99.0°F | Ht 63.0 in | Wt 187.8 lb

## 2021-10-02 DIAGNOSIS — D869 Sarcoidosis, unspecified: Secondary | ICD-10-CM | POA: Diagnosis not present

## 2021-10-02 DIAGNOSIS — E1165 Type 2 diabetes mellitus with hyperglycemia: Secondary | ICD-10-CM | POA: Diagnosis not present

## 2021-10-02 MED ORDER — HYDROCODONE-ACETAMINOPHEN 5-325 MG PO TABS: 1.0000 | ORAL_TABLET | Freq: Three times a day (TID) | ORAL | 0 refills | Status: DC | PRN

## 2021-10-02 MED ORDER — INSULIN PEN NEEDLE 32G X 4 MM MISC: 1 refills | Status: DC

## 2021-10-02 MED ORDER — INSULIN GLARGINE 100 UNIT/ML SOLOSTAR PEN: 15.0000 [IU] | PEN_INJECTOR | Freq: Every day | SUBCUTANEOUS | 1 refills | Status: DC

## 2021-10-02 MED ORDER — FREESTYLE LIBRE 14 DAY SENSOR MISC: 6 refills | Status: DC

## 2021-10-02 NOTE — Telephone Encounter (Signed)
Patient calling back about referral to [place] sarcoidosis clinic stating provider or referral person would have to call to get this scheduled and they need to know what she is being seen for, what parts of the body will they be looking at.

## 2021-10-02 NOTE — Patient Instructions (Signed)
Insulin as prescribed.  Pain medication as needed. Use sparingly.  Follow up in 1 month.

## 2021-10-03 DIAGNOSIS — E1165 Type 2 diabetes mellitus with hyperglycemia: Secondary | ICD-10-CM | POA: Insufficient documentation

## 2021-10-03 NOTE — Assessment & Plan Note (Signed)
We had a lengthy discussion today about starting insulin therapy.  I showed her how an insulin pen is used.  Also got our pharmacist involved to show her how CGM is used and placed.  He also showed her how to use the app to document her blood sugar readings.  Starting Lantus 15 units daily.  I anticipate that this will have to be increased steadily to achieve her glycemic goal in the setting of increasing steroid use.  Rx sent for her continuous glucose monitor as well.

## 2021-10-03 NOTE — Assessment & Plan Note (Signed)
Patient endorsing significant pain due to bone involvement of sarcoidosis.  Tramadol has not been helping her pain.  Placing on hydrocodone.  If this becomes a chronic issue, we will have to discuss hospital/clinic policies regarding use of chronic narcotics and discussed the risks and benefits of chronic use.

## 2021-10-03 NOTE — Progress Notes (Signed)
Subjective:  Patient ID: Courtney Hale, female    DOB: 1966-06-13  Age: 55 y.o. MRN: [id number]  CC: Chief Complaint  Patient presents with   Follow-up    Patient is here to go over labs    HPI:  55 year old female with sarcoidosis with bone involvement, type 2 diabetes which is worsening in the setting of steroid use, hypertension, migraine, anxiety depression presents for follow-up regarding diabetes.  Patient's most recent A1c is 7.9.  Her blood glucose readings have been rising and are uncontrolled in the setting of prednisone use.  We discussed treatment options today.  My recommendation is for insulin therapy.  We will discuss this at length today.  Patient is compliant with metformin.  Additionally, patient reports ongoing pain secondary to her sarcoidosis.  She would like to discuss treatment options.  She states that tramadol does not help her pain.  She has significant pain of her hips and also of her head.   Patient Active Problem List   Diagnosis Date Noted   Uncontrolled type 2 diabetes mellitus with hyperglycemia ([place]) 10/03/2021   Sarcoidosis 08/14/2021   Lipoma 03/09/2021   GERD (gastroesophageal reflux disease) 07/27/2017   Anxiety and depression 12/18/2016   Venous stasis dermatitis of both lower extremities 06/09/2016   IBS (irritable bowel syndrome) 03/18/2016   Melanoma of skin ([place]) 03/18/2016   Headache, chronic migraine without aura 01/31/2013   Hypertension 01/31/2013    Social Hx   Social History   Socioeconomic History   Marital status: Single    Spouse name: Not on file   Number of children: Not on file   Years of education: Not on file   Highest education level: Not on file  Occupational History   Not on file  Tobacco Use   Smoking status: Never   Smokeless tobacco: Never  Vaping Use   Vaping Use: Never used  Substance and Sexual Activity   Alcohol use: Yes    Comment: occassionally   Drug use: No   Sexual activity: Not on file   Other Topics Concern   Not on file  Social History Narrative   Not on file   Social Determinants of Health   Financial Resource Strain: Low Risk    Difficulty of Paying Living Expenses: Not hard at all  Food Insecurity: No Food Insecurity   Worried About Charity fundraiser in the Last Year: Never true   Ran Out of Food in the Last Year: Never true  Transportation Needs: No Transportation Needs   Lack of Transportation (Medical): No   Lack of Transportation (Non-Medical): No  Physical Activity: Inactive   Days of Exercise per Week: 0 days   Minutes of Exercise per Session: 0 min  Stress: Stress Concern Present   Feeling of Stress : Very much  Social Connections: Socially Isolated   Frequency of Communication with Friends and Family: More than three times a week   Frequency of Social Gatherings with Friends and Family: More than three times a week   Attends Religious Services: Never   Marine scientist or Organizations: No   Attends Music therapist: Never   Marital Status: Never married    Review of Systems Per HPI  Objective:  BP 118/82   Pulse 86   Temp 99 F (37.2 C) (Oral)   Ht 5\' 3"  (1.6 m)   Wt 187 lb 12.8 oz (85.2 kg)   LMP 10/26/2014   SpO2 95%   BMI  33.27 kg/m   BP/Weight 10/02/2021 10/01/2021 02/24/9766  Systolic BP 341 937 902  Diastolic BP 82 74 79  Wt. (Lbs) 187.8 188 186  BMI 33.27 33.3 32.95    Physical Exam Vitals and nursing note reviewed.  Constitutional:      General: She is not in acute distress.    Appearance: Normal appearance. She is not ill-appearing.  HENT:     Head: Normocephalic and atraumatic.  Eyes:     General:        Right eye: No discharge.        Left eye: No discharge.     Conjunctiva/sclera: Conjunctivae normal.  Pulmonary:     Effort: Pulmonary effort is normal. No respiratory distress.  Neurological:     Mental Status: She is alert.  Psychiatric:        Mood and Affect: Mood normal.         Behavior: Behavior normal.    Lab Results  Component Value Date   WBC 11.7 (H) 08/26/2021   HGB 13.7 08/26/2021   HCT 39.8 08/26/2021   PLT 304 08/26/2021   GLUCOSE 198 (H) 08/26/2021   CHOL 258 (H) 09/25/2021   TRIG 169 (H) 09/25/2021   HDL 59 09/25/2021   LDLCALC 168 (H) 09/25/2021   ALT 36 08/26/2021   AST 24 08/26/2021   NA 135 08/26/2021   K 3.7 08/26/2021   CL 101 08/26/2021   CREATININE 0.66 08/26/2021   BUN 18 08/26/2021   CO2 24 08/26/2021   TSH 0.996 08/27/2020   INR 0.9 07/21/2021   HGBA1C 7.9 (H) 09/25/2021     Assessment & Plan:   Problem List Items Addressed This Visit       Endocrine   Uncontrolled type 2 diabetes mellitus with hyperglycemia ([place]) - Primary    We had a lengthy discussion today about starting insulin therapy.  I showed her how an insulin pen is used.  Also got our pharmacist involved to show her how CGM is used and placed.  He also showed her how to use the app to document her blood sugar readings.  Starting Lantus 15 units daily.  I anticipate that this will have to be increased steadily to achieve her glycemic goal in the setting of increasing steroid use.  Rx sent for her continuous glucose monitor as well.      Relevant Medications   insulin glargine (LANTUS) 100 UNIT/ML Solostar Pen   Insulin Pen Needle 32G X 4 MM MISC   Continuous Blood Gluc Sensor (FREESTYLE LIBRE 14 DAY SENSOR) MISC     Other   Sarcoidosis    Patient endorsing significant pain due to bone involvement of sarcoidosis.  Tramadol has not been helping her pain.  Placing on hydrocodone.  If this becomes a chronic issue, we will have to discuss hospital/clinic policies regarding use of chronic narcotics and discussed the risks and benefits of chronic use.       Meds ordered this encounter  Medications   HYDROcodone-acetaminophen (NORCO/VICODIN) 5-325 MG tablet    Sig: Take 1 tablet by mouth every 8 (eight) hours as needed for moderate pain.    Dispense:  30 tablet     Refill:  0   insulin glargine (LANTUS) 100 UNIT/ML Solostar Pen    Sig: Inject 15 Units into the skin daily.    Dispense:  15 mL    Refill:  1   Insulin Pen Needle 32G X 4 MM MISC    Sig:  Use as directed to administer insulin.    Dispense:  100 each    Refill:  1   Continuous Blood Gluc Sensor (FREESTYLE LIBRE 14 DAY SENSOR) MISC    Sig: Use as directed to check blood sugar levels.    Dispense:  2 each    Refill:  6    Follow-up:  Return in about 1 month (around 11/02/2021).  [place]

## 2021-10-03 NOTE — Assessment & Plan Note (Deleted)
We had a lengthy discussion today about starting insulin therapy.  I showed her how an insulin pen is used.  Also got our pharmacist involved to show her how CGM is used and placed.  He also showed her how to use the app to document her blood sugar readings.  Starting Lantus 15 units daily.  I anticipate that this will have to be increased steadily to achieve her glycemic goal in the setting of increasing steroid use.  Rx sent for her continuous glucose monitor as well.

## 2021-10-05 NOTE — Telephone Encounter (Signed)
Coral Spikes, DO    Please send to [facility]; Has lung and bone involvement. Referral says pending.

## 2021-10-05 NOTE — Telephone Encounter (Signed)
Spoke with pt and informed her per courtney her paperwork has been faxed to [facility] on 09/30/21 and we are waiting for patient to get a call to schedule.

## 2021-10-06 ENCOUNTER — Telehealth: Payer: Self-pay | Admitting: Family Medicine

## 2021-10-06 DIAGNOSIS — C44612 Basal cell carcinoma of skin of right upper limb, including shoulder: Secondary | ICD-10-CM | POA: Diagnosis not present

## 2021-10-06 NOTE — Telephone Encounter (Signed)
Patient was seen 12/9 and checking on her prescription for freestyle libre 14 day senor she states needing prior authorization from [place] . She has the number [phone]

## 2021-10-08 NOTE — Telephone Encounter (Signed)
PA completed this morning; await decision from insurance.

## 2021-10-09 ENCOUNTER — Telehealth: Payer: Self-pay | Admitting: Family Medicine

## 2021-10-09 NOTE — Telephone Encounter (Signed)
Patient states her sugars have been running high on prednisone and when would like to discuss with Dr Lacinda Axon. Patient scheduled appointment 10/12/21 with Dr Lacinda Axon.

## 2021-10-09 NOTE — Telephone Encounter (Signed)
Will just use regular glucometer for now.

## 2021-10-09 NOTE — Telephone Encounter (Signed)
Freestyle Libre denied. Insurance states they may consider approval of this device when pt requires 2 or more insulin injection daily, your insulin treatment regimen requires frequent adjustment based on standard blood glucose monitor or non therapeutic continuous glucose monitor testing. Please advise. Thank you!  (Hydrocodone PA approved from 10/08/21-04/06/22)

## 2021-10-10 ENCOUNTER — Encounter: Payer: Self-pay | Admitting: Pulmonary Disease

## 2021-10-12 ENCOUNTER — Ambulatory Visit: Payer: Medicaid Other | Admitting: Family Medicine

## 2021-10-12 ENCOUNTER — Other Ambulatory Visit: Payer: Self-pay

## 2021-10-12 DIAGNOSIS — E1165 Type 2 diabetes mellitus with hyperglycemia: Secondary | ICD-10-CM

## 2021-10-12 MED ORDER — FREESTYLE LIBRE 14 DAY SENSOR MISC: 6 refills | Status: DC

## 2021-10-12 MED ORDER — NOVOLOG FLEXPEN 100 UNIT/ML ~~LOC~~ SOPN: PEN_INJECTOR | SUBCUTANEOUS | 1 refills | Status: DC

## 2021-10-12 NOTE — Patient Instructions (Signed)
Increase Lantus 1-2 units daily until fastings are around 100.  Check some postprandial sugars (2 hours after eating).  Follow up in 2 weeks - 1 month.

## 2021-10-12 NOTE — Progress Notes (Signed)
Subjective:  Patient ID: Courtney Hale, female    DOB: 01-27-1966  Age: 55 y.o. MRN: [id number]  CC: Chief Complaint  Patient presents with   blood sugars elevated     Currently on prednisone for sarcadosis  Fbs 170 to 200    Dizziness    HPI:  55 year old female with hypertension, uncontrolled type 2 diabetes, sarcoidosis presents for follow-up regarding diabetes.  Patient has recently started 15 units of Lantus daily.  She remains compliant with metformin.  Blood sugars continue to be elevated.  She states that her fastings are in the 170s to 200s.  She has high readings during the day.  Her prednisone was recently increased.  She is awaiting an appointment at [facility] sarc[facility].  She was unable to get the freestyle libre due to insurance issues.  No reports of hypoglycemia.   Patient Active Problem List   Diagnosis Date Noted   Uncontrolled type 2 diabetes mellitus with hyperglycemia ([place]) 10/03/2021   Sarcoidosis 08/14/2021   Lipoma 03/09/2021   GERD (gastroesophageal reflux disease) 07/27/2017   Anxiety and depression 12/18/2016   Venous stasis dermatitis of both lower extremities 06/09/2016   IBS (irritable bowel syndrome) 03/18/2016   Melanoma of skin ([place]) 03/18/2016   Headache, chronic migraine without aura 01/31/2013   Hypertension 01/31/2013    Social Hx   Social History   Socioeconomic History   Marital status: Single    Spouse name: Not on file   Number of children: Not on file   Years of education: Not on file   Highest education level: Not on file  Occupational History   Not on file  Tobacco Use   Smoking status: Never   Smokeless tobacco: Never  Vaping Use   Vaping Use: Never used  Substance and Sexual Activity   Alcohol use: Yes    Comment: occassionally   Drug use: No   Sexual activity: Not on file  Other Topics Concern   Not on file  Social History Narrative   Not on file   Social Determinants of Health   Financial Resource  Strain: Low Risk    Difficulty of Paying Living Expenses: Not hard at all  Food Insecurity: No Food Insecurity   Worried About Charity fundraiser in the Last Year: Never true   Ran Out of Food in the Last Year: Never true  Transportation Needs: No Transportation Needs   Lack of Transportation (Medical): No   Lack of Transportation (Non-Medical): No  Physical Activity: Inactive   Days of Exercise per Week: 0 days   Minutes of Exercise per Session: 0 min  Stress: Stress Concern Present   Feeling of Stress : Very much  Social Connections: Socially Isolated   Frequency of Communication with Friends and Family: More than three times a week   Frequency of Social Gatherings with Friends and Family: More than three times a week   Attends Religious Services: Never   Marine scientist or Organizations: No   Attends Archivist Meetings: Never   Marital Status: Never married    Review of Systems  Constitutional: Negative.   Endocrine:       Hyperglycemia.    Objective:  BP 120/80    Pulse 97    Temp 98.5 F (36.9 C)    Ht 5\' 3"  (1.6 m)    Wt 187 lb (84.8 kg)    LMP 10/26/2014    SpO2 97%    BMI 33.13 kg/m  BP/Weight 10/12/2021 10/02/2021 93/04/3427  Systolic BP 768 115 726  Diastolic BP 80 82 74  Wt. (Lbs) 187 187.8 188  BMI 33.13 33.27 33.3    Physical Exam Vitals and nursing note reviewed.  Constitutional:      General: She is not in acute distress.    Appearance: Normal appearance. She is not ill-appearing.  Cardiovascular:     Rate and Rhythm: Normal rate and regular rhythm.     Heart sounds: No murmur heard. Pulmonary:     Effort: Pulmonary effort is normal.     Breath sounds: Normal breath sounds. No wheezing, rhonchi or rales.  Neurological:     Mental Status: She is alert.  Psychiatric:        Mood and Affect: Mood normal.        Behavior: Behavior normal.    Lab Results  Component Value Date   WBC 11.7 (H) 08/26/2021   HGB 13.7 08/26/2021    HCT 39.8 08/26/2021   PLT 304 08/26/2021   GLUCOSE 198 (H) 08/26/2021   CHOL 258 (H) 09/25/2021   TRIG 169 (H) 09/25/2021   HDL 59 09/25/2021   LDLCALC 168 (H) 09/25/2021   ALT 36 08/26/2021   AST 24 08/26/2021   NA 135 08/26/2021   K 3.7 08/26/2021   CL 101 08/26/2021   CREATININE 0.66 08/26/2021   BUN 18 08/26/2021   CO2 24 08/26/2021   TSH 0.996 08/27/2020   INR 0.9 07/21/2021   HGBA1C 7.9 (H) 09/25/2021     Assessment & Plan:   Problem List Items Addressed This Visit       Endocrine   Uncontrolled type 2 diabetes mellitus with hyperglycemia ([place])    Remains uncontrolled.  Acute worsening due to prednisone use for sarcoidosis.  Prednisone dose is increased.  Increasing Lantus 1 to 2 units daily until fasting blood sugars are around 200.  NovoLog if needed.  Rx was sent.  Trying to get her approved for freestyle libre.  Follow-up in 2 weeks to a month.      Relevant Medications   Continuous Blood Gluc Sensor (FREESTYLE LIBRE 14 DAY SENSOR) MISC   insulin aspart (NOVOLOG FLEXPEN) 100 UNIT/ML FlexPen    Meds ordered this encounter  Medications   Continuous Blood Gluc Sensor (FREESTYLE LIBRE 14 DAY SENSOR) MISC    Sig: Use as directed to check blood sugar levels.    Dispense:  2 each    Refill:  6   insulin aspart (NOVOLOG FLEXPEN) 100 UNIT/ML FlexPen    Sig: 5 units with largest meal of the day.    Dispense:  3 mL    Refill:  1    Follow-up:  2 weeks to 1 month.  [place]

## 2021-10-12 NOTE — Assessment & Plan Note (Signed)
Remains uncontrolled.  Acute worsening due to prednisone use for sarcoidosis.  Prednisone dose is increased.  Increasing Lantus 1 to 2 units daily until fasting blood sugars are around 200.  NovoLog if needed.  Rx was sent.  Trying to get her approved for freestyle libre.  Follow-up in 2 weeks to a month.

## 2021-10-16 ENCOUNTER — Other Ambulatory Visit: Payer: Self-pay

## 2021-10-16 ENCOUNTER — Encounter (HOSPITAL_COMMUNITY): Payer: Self-pay | Admitting: *Deleted

## 2021-10-16 ENCOUNTER — Emergency Department (HOSPITAL_COMMUNITY)
Admission: EM | Admit: 2021-10-16 | Discharge: 2021-10-17 | Disposition: A | Discharge status: Home / Self Care | Payer: Medicaid Other | Attending: Emergency Medicine | Admitting: Emergency Medicine

## 2021-10-16 DIAGNOSIS — Z794 Long term (current) use of insulin: Secondary | ICD-10-CM | POA: Diagnosis not present

## 2021-10-16 DIAGNOSIS — Z79899 Other long term (current) drug therapy: Secondary | ICD-10-CM | POA: Insufficient documentation

## 2021-10-16 DIAGNOSIS — E119 Type 2 diabetes mellitus without complications: Secondary | ICD-10-CM | POA: Diagnosis not present

## 2021-10-16 DIAGNOSIS — R220 Localized swelling, mass and lump, head: Secondary | ICD-10-CM | POA: Insufficient documentation

## 2021-10-16 DIAGNOSIS — I10 Essential (primary) hypertension: Secondary | ICD-10-CM | POA: Insufficient documentation

## 2021-10-16 DIAGNOSIS — Z7984 Long term (current) use of oral hypoglycemic drugs: Secondary | ICD-10-CM | POA: Insufficient documentation

## 2021-10-16 DIAGNOSIS — Z85820 Personal history of malignant melanoma of skin: Secondary | ICD-10-CM | POA: Insufficient documentation

## 2021-10-16 DIAGNOSIS — R609 Edema, unspecified: Secondary | ICD-10-CM | POA: Diagnosis not present

## 2021-10-16 MED ORDER — KETOROLAC TROMETHAMINE 30 MG/ML IJ SOLN
30.0000 mg | Freq: Once | INTRAMUSCULAR | Status: AC
  Administered 2021-10-16: 23:00:00 30 mg via INTRAVENOUS
  Filled 2021-10-16: qty 1

## 2021-10-16 MED ORDER — FAMOTIDINE IN NACL 20-0.9 MG/50ML-% IV SOLN
20.0000 mg | Freq: Once | INTRAVENOUS | Status: AC
  Administered 2021-10-16: 21:00:00 20 mg via INTRAVENOUS
  Filled 2021-10-16: qty 50

## 2021-10-16 MED ORDER — EPINEPHRINE 0.3 MG/0.3ML IJ SOAJ: 0.3000 mg | INTRAMUSCULAR | 0 refills | Status: DC | PRN

## 2021-10-16 MED ORDER — DIPHENHYDRAMINE HCL 50 MG/ML IJ SOLN
50.0000 mg | Freq: Once | INTRAMUSCULAR | Status: AC
  Administered 2021-10-16: 21:00:00 50 mg via INTRAVENOUS
  Filled 2021-10-16: qty 1

## 2021-10-16 MED ORDER — METOCLOPRAMIDE HCL 5 MG/ML IJ SOLN
10.0000 mg | Freq: Once | INTRAMUSCULAR | Status: AC
  Administered 2021-10-16: 23:00:00 10 mg via INTRAVENOUS
  Filled 2021-10-16: qty 2

## 2021-10-16 MED ORDER — PREDNISONE 20 MG PO TABS
20.0000 mg | ORAL_TABLET | Freq: Once | ORAL | Status: AC
  Administered 2021-10-16: 21:00:00 20 mg via ORAL
  Filled 2021-10-16: qty 1

## 2021-10-16 NOTE — ED Triage Notes (Signed)
Pt with left sided facial swelling today off and on for past month.  Worse today.

## 2021-10-16 NOTE — Discharge Instructions (Addendum)
I suspect that your symptoms are due to some sort of allergic reaction. You should take around the clock benadryl over the next 24 hours to reduce the swelling in your face. You can take up to 50 mg every 4-6 hours.  I have prescribed you an EpiPen for you to use as needed if you ever develop swelling like this that is affecting the way that you breath.   Please return to the ED if your headache continues to worsen after tonight.

## 2021-10-16 NOTE — ED Provider Notes (Signed)
Her face started swelling. [facility] EMERGENCY DEPARTMENT Provider Note   CSN: [id number] Arrival date & time: 10/16/21  1946     History Chief Complaint  Patient presents with   Facial Swelling    Courtney Hale is a 55 y.o. female. Patient states that at about 5 PM today she was talking to her daughter and she says she feels some mild throat tightness.  Denies any difficulty breathing.  Moving air well.  She does not remember eating or taking any abnormal medications she does not normally take.  She has had this happen a couple times before most recently last month, however the only difference is that she is having significant pain at the left side of her head this time.  She has never been evaluated by a medical provider for this. She does have a history of sarcoidosis and metastatic bone cancer.  She is on chronic steroid therapy.  HPI     Past Medical History:  Diagnosis Date   Complication of anesthesia    woke up during endoscopy and colonoscopy   Depression    Hemorrhoids    History of esophageal stricture    2011  peptic w/  dilatation   History of gastritis    2011   History of melanoma excision    2015  left leg/   12-09-2015 back    Hypertension    Microhematuria    Migraines    Nephrolithiasis    left side non-obstructive    Right ureteral stone    Sarcoidosis 07/24/2021   Urgency of urination    Wears contact lenses     Patient Active Problem List   Diagnosis Date Noted   Uncontrolled type 2 diabetes mellitus with hyperglycemia ([place]) 10/03/2021   Sarcoidosis 08/14/2021   Lipoma 03/09/2021   GERD (gastroesophageal reflux disease) 07/27/2017   Anxiety and depression 12/18/2016   Venous stasis dermatitis of both lower extremities 06/09/2016   IBS (irritable bowel syndrome) 03/18/2016   Melanoma of skin ([place]) 03/18/2016   Headache, chronic migraine without aura 01/31/2013   Hypertension 01/31/2013    Past Surgical History:  Procedure Laterality  Date   ABDOMINAL HYSTERECTOMY     CARPAL TUNNEL RELEASE Right 01-05- 2015   COLONOSCOPY N/A 08/27/2015   Procedure: COLONOSCOPY;  Surgeon: Danie Binder, MD;  Location: [facility];  Service: Endoscopy;  Laterality: N/A;  0830   CYST EXCISION  2012     right hand   CYSTOSCOPY W/ URETERAL STENT PLACEMENT Right 12/22/2015   Procedure: CYSTOSCOPY WITH RETROGRADE PYELOGRAM/URETERAL STENT PLACEMENT;  Surgeon: Nickie Retort, MD;  Location: [facility];  Service: Urology;  Laterality: Right;   CYSTOSCOPY WITH RETROGRADE PYELOGRAM, URETEROSCOPY AND STENT PLACEMENT Right 01/05/2016   Procedure: CYSTOSCOPY WITH RETROGRADE PYELOGRAM, URETEROSCOPY AND STENT REPLACEMENT;  Surgeon: Nickie Retort, MD;  Location: [facility];  Service: Urology;  Laterality: Right;   DILATION AND CURETTAGE OF UTERUS  03-12-2009   w/  Suction   double balloon enteroscopy     Dr. Arsenio Loader at [facility]: no erosions, no evidence of Crohn's disease, path without Crohn's.    EGD with push enteroscopy  02-12-2010    patent distal peptic stricture with diffuse antral erythema, normal D1 and D2    LAPAROSCOPIC ASSISTED VAGINAL HYSTERECTOMY  10-26-2014   w/  Bilateral Salpingoophorectomy   STONE EXTRACTION WITH BASKET Right 01/05/2016   Procedure: STONE EXTRACTION WITH BASKET;  Surgeon: Nickie Retort, MD;  Location: [facility];  Service: Urology;  Laterality: Right;   TONSILLECTOMY  1998  approx     OB History   No obstetric history on file.     Family History  Problem Relation Age of Onset   Diabetes Mother    Colon cancer Neg Hx     Social History   Tobacco Use   Smoking status: Never   Smokeless tobacco: Never  Vaping Use   Vaping Use: Never used  Substance Use Topics   Alcohol use: Yes    Comment: occassionally   Drug use: No    Home Medications Prior to Admission medications   Medication Sig Start Date End Date Taking? Authorizing Provider  EPINEPHrine 0.3 mg/0.3 mL  IJ SOAJ injection Inject 0.3 mg into the muscle as needed for anaphylaxis. 10/16/21  Yes Brissa Asante, Adora Fridge, PA-C  albuterol (VENTOLIN HFA) 108 (90 Base) MCG/ACT inhaler Inhale 2 puffs into the lungs every 6 (six) hours as needed for wheezing or shortness of breath. 08/27/21   Margaretha Seeds, MD  busPIRone (BUSPAR) 5 MG tablet TAKE 1 TABLET BY MOUTH THREE TIMES DAILY 03/25/21   Elvia Collum M, DO  calcium carbonate (TUMS - DOSED IN MG ELEMENTAL CALCIUM) 500 MG chewable tablet Chew 1,500 mg by mouth 2 (two) times daily as needed for indigestion or heartburn.    [provider]  Carboxymethylcellul-Glycerin (LUBRICATING EYE DROPS OP) Place 1 drop into both eyes daily as needed (dry eyes).    [provider]  cetirizine (ZYRTEC) 10 MG chewable tablet Chew 10 mg by mouth daily.    [provider]  Cholecalciferol (VITAMIN D) 50 MCG (2000 UT) tablet Take 2,000 Units by mouth daily.    [provider]  Chromium 1000 MCG TABS Take 1,000 mcg by mouth daily.    [provider]  CINNAMON PO Take 2,000 mg by mouth daily.    [provider]  Continuous Blood Gluc Sensor (FREESTYLE LIBRE 14 DAY SENSOR) MISC Use as directed to check blood sugar levels. 10/12/21   Coral Spikes, DO  Cyanocobalamin (B-12) 2500 MCG TABS Take 2,500 mcg by mouth daily.    [provider]  hydrochlorothiazide (HYDRODIURIL) 25 MG tablet Take 1 tablet (25 mg total) by mouth daily. 09/25/21   Coral Spikes, DO  HYDROcodone-acetaminophen (NORCO/VICODIN) 5-325 MG tablet Take 1 tablet by mouth every 8 (eight) hours as needed for moderate pain. 10/02/21   Coral Spikes, DO  insulin aspart (NOVOLOG FLEXPEN) 100 UNIT/ML FlexPen 5 units with largest meal of the day. 10/12/21   Coral Spikes, DO  insulin glargine (LANTUS) 100 UNIT/ML Solostar Pen Inject 15 Units into the skin daily. 10/02/21   Coral Spikes, DO  Insulin Pen Needle 32G X 4 MM MISC Use as directed to administer insulin.  10/02/21   Coral Spikes, DO  losartan (COZAAR) 100 MG tablet TAKE 1 TABLET BY MOUTH ONCE DAILY. 09/25/21   Coral Spikes, DO  meclizine (ANTIVERT) 25 MG tablet Take 25 mg by mouth 3 (three) times daily as needed for dizziness.    [provider]  metFORMIN (GLUCOPHAGE) 850 MG tablet Take 1 tablet (850 mg total) by mouth 2 (two) times daily with a meal. 09/25/21   Cook, Barnie Del, DO  omeprazole (PRILOSEC) 40 MG capsule Take 1 capsule (40 mg total) by mouth daily. 10/01/21   Margaretha Seeds, MD  ondansetron (ZOFRAN-ODT) 4 MG disintegrating tablet Take 4 mg by mouth every 8 (  eight) hours as needed for nausea or vomiting.    [provider]  PARoxetine (PAXIL) 40 MG tablet Take 1 tablet (40 mg total) by mouth every morning. 09/25/21   Coral Spikes, DO  predniSONE (DELTASONE) 10 MG tablet 2 daily until feeling better then 1 daily with breakfast 08/14/21   Tanda Rockers, MD  predniSONE (DELTASONE) 10 MG tablet Take 4 tablets (40 mg total) by mouth daily with breakfast. Patient not taking: Reported on 10/12/2021 10/01/21 11/12/21  Margaretha Seeds, MD  Probiotic Product (PROBIOTIC PO) Take 1 capsule by mouth daily.    [provider]  prochlorperazine (COMPAZINE) 10 MG tablet Take 1 tablet (10 mg total) by mouth every 6 (six) hours as needed for nausea or vomiting. 07/13/21   Derek Jack, MD  rizatriptan (MAXALT-MLT) 10 MG disintegrating tablet Take 1 tablet (10 mg total) by mouth as needed for migraine. May repeat in 2 hours if needed. Max 2 per 24 hours 05/22/20   Elvia Collum M, DO  traZODone (DESYREL) 100 MG tablet Take 1 tablet (100 mg total) by mouth at bedtime as needed for sleep. 09/25/21   Coral Spikes, DO  valACYclovir (VALTREX) 500 MG tablet Take 500 mg by mouth 2 (two) times daily as needed (outbreaks). 06/05/21   [provider]    Allergies    Topamax [topiramate] and Sulfa antibiotics  Review of Systems   Review of Systems  HENT:  Positive for  facial swelling.   Neurological:  Positive for headaches.  All other systems reviewed and are negative.  Physical Exam Updated Vital Signs BP 120/74    Pulse 70    Temp 97.7 F (36.5 C) (Oral)    Resp 16    Ht 5' 3.5" (1.613 m)    Wt 84.8 kg    LMP 10/26/2014    SpO2 96%    BMI 32.61 kg/m   Physical Exam Vitals and nursing note reviewed.  Constitutional:      General: She is not in acute distress.    Appearance: Normal appearance. She is well-developed. She is not ill-appearing, toxic-appearing or diaphoretic.  HENT:     Head: Normocephalic and atraumatic.     Comments: There is moderate swelling of the patient's left cheek and surrounding left eye.  Extends down to the lower part of the face.      Nose: No nasal deformity.     Mouth/Throat:     Lips: Pink. No lesions.     Mouth: Mucous membranes are moist.     Pharynx: Oropharynx is clear. No oropharyngeal exudate or posterior oropharyngeal erythema.  Eyes:     General: Gaze aligned appropriately. No scleral icterus.       Right eye: No discharge.        Left eye: No discharge.     Extraocular Movements: Extraocular movements intact.     Conjunctiva/sclera: Conjunctivae normal.     Right eye: Right conjunctiva is not injected. No exudate or hemorrhage.    Left eye: Left conjunctiva is not injected. No exudate or hemorrhage.    Pupils: Pupils are equal, round, and reactive to light.  Neck:     Comments: Neck without swelling. Cardiovascular:     Rate and Rhythm: Normal rate and regular rhythm.     Pulses: Normal pulses.     Heart sounds: Normal heart sounds. No murmur heard.   No friction rub. No gallop.  Pulmonary:     Effort: Pulmonary effort is  normal. No respiratory distress.     Breath sounds: Normal breath sounds. No stridor or decreased air movement. No decreased breath sounds, wheezing, rhonchi or rales.     Comments: Trachea is midline. Abdominal:     General: Abdomen is flat.     Palpations: Abdomen is soft.      Tenderness: There is no abdominal tenderness. There is no guarding or rebound.  Skin:    General: Skin is warm and dry.     Coloration: Skin is not jaundiced or pale.     Findings: No bruising or erythema.     Comments: No rashes noted on exam.  Neurological:     Mental Status: She is alert and oriented to person, place, and time.     Comments: Alert and Oriented x 3 Speech clear with no aphasia Cranial Nerve testing - Visual Fields grossly intact - PERRLA. EOM intact. No Nystagmus - Facial Sensation grossly intact - No facial asymmetry - Uvula and Tongue Midline - Accessory Muscles intact Motor: - 5/5 motor strength in all four extremities.  - No pronator Drift - Normal tone Sensation: - Grossly intact in all four extremities.     Psychiatric:        Mood and Affect: Mood normal.        Speech: Speech normal.        Behavior: Behavior normal. Behavior is cooperative.    ED Results / Procedures / Treatments   Labs (all labs ordered are listed, but only abnormal results are displayed) Labs Reviewed - No data to display  EKG None  Radiology No results found.  Procedures Procedures   Medications Ordered in ED Medications  ketorolac (TORADOL) 30 MG/ML injection 30 mg (has no administration in time range)  metoCLOPramide (REGLAN) injection 10 mg (has no administration in time range)  diphenhydrAMINE (BENADRYL) injection 50 mg (50 mg Intravenous Given 10/16/21 2049)  famotidine (PEPCID) IVPB 20 mg premix (0 mg Intravenous Stopped 10/16/21 2122)  predniSONE (DELTASONE) tablet 20 mg (20 mg Oral Given 10/16/21 2049)    ED Course  I have reviewed the triage vital signs and the nursing notes.  Pertinent labs & imaging results that were available during my care of the patient were reviewed by me and considered in my medical decision making (see chart for details).    MDM Rules/Calculators/A&P                          This is a 55 y.o. female with a PMH of  sarcoidosis, melanoma, metastatic bone cancer who presents to the ED with left-sided facial swelling with some associated throat tightness and left-sided headache that started about 5 PM today.  This is happened intermittently in the past couple of months, however she has never had the associated headache.  Review of Past Records: hx Sarcoidosis, metastatic bone cancer and melenoma. On 40 mg Pred daily. Not on chemo  Vitals: Stable.  Afebrile  Exam: Normal Neurological exam.  Minimally appreciable left-sided facial swelling.  No facial droop noted.  Patient is moving air normally.  Breath sounds clear to auscultation bilaterally.  Does not appear to be in any acute respiratory distress.  Initial Impression: I feel patient's facial swelling is likely secondary to some sort of allergic reaction.  Plan on giving patient Benadryl, Pepcid, and 20 mg prednisone for daily prednisone.  On reevaluation, patient continues to have headache.  She continues to be neurologically intact.  Will give  migraine cocktail.  Events/Interventions: benadryl, pepcid, 20 prednisone, Toradol, reglan  Impression: I think patient is likely with angioedema secondary to medication v other allergy. Headache is likely reactant.   Dispo Plan: Plan to discharge patient home. She should take around the clock benadryl for 24 hours. Will send patient home with epi pen for emergencies. Return precautions provided to patient if headache does not improve.   I have seen and evaluated this patient in conjunction with my attending physician who agrees and has made changes to the plan accordingly.  Portions of this note were generated with [employer]. Dictation errors may occur despite best attempts at proofreading.   Final Clinical Impression(s) / ED Diagnoses Final diagnoses:  Facial swelling    Rx / DC Orders ED Discharge Orders          Ordered    EPINEPHrine 0.3 mg/0.3 mL IJ SOAJ injection  As needed         10/16/21 2244             Adolphus Birchwood, PA-C 10/16/21 2254    Margette Fast, MD 10/20/21 [phone]

## 2021-10-19 ENCOUNTER — Encounter (HOSPITAL_COMMUNITY): Payer: Self-pay

## 2021-10-19 ENCOUNTER — Other Ambulatory Visit: Payer: Self-pay

## 2021-10-19 ENCOUNTER — Emergency Department (HOSPITAL_COMMUNITY)
Admission: EM | Admit: 2021-10-19 | Discharge: 2021-10-20 | Disposition: A | Discharge status: Home / Self Care | Payer: Medicaid Other | Attending: Emergency Medicine | Admitting: Emergency Medicine

## 2021-10-19 DIAGNOSIS — E1165 Type 2 diabetes mellitus with hyperglycemia: Secondary | ICD-10-CM | POA: Diagnosis not present

## 2021-10-19 DIAGNOSIS — I10 Essential (primary) hypertension: Secondary | ICD-10-CM | POA: Insufficient documentation

## 2021-10-19 DIAGNOSIS — R739 Hyperglycemia, unspecified: Secondary | ICD-10-CM

## 2021-10-19 DIAGNOSIS — Z7984 Long term (current) use of oral hypoglycemic drugs: Secondary | ICD-10-CM | POA: Diagnosis not present

## 2021-10-19 DIAGNOSIS — Z85828 Personal history of other malignant neoplasm of skin: Secondary | ICD-10-CM | POA: Insufficient documentation

## 2021-10-19 DIAGNOSIS — R42 Dizziness and giddiness: Secondary | ICD-10-CM | POA: Diagnosis not present

## 2021-10-19 DIAGNOSIS — Z794 Long term (current) use of insulin: Secondary | ICD-10-CM | POA: Insufficient documentation

## 2021-10-19 DIAGNOSIS — Z79899 Other long term (current) drug therapy: Secondary | ICD-10-CM | POA: Insufficient documentation

## 2021-10-19 LAB — CBC WITH DIFFERENTIAL/PLATELET
Abs Immature Granulocytes: 0.08 10*3/uL — ABNORMAL HIGH (ref 0.00–0.07)
Basophils Absolute: 0 10*3/uL (ref 0.0–0.1)
Basophils Relative: 0 %
Eosinophils Absolute: 0 10*3/uL (ref 0.0–0.5)
Eosinophils Relative: 0 %
HCT: 39.6 % (ref 36.0–46.0)
Hemoglobin: 13.4 g/dL (ref 12.0–15.0)
Immature Granulocytes: 1 %
Lymphocytes Relative: 35 %
Lymphs Abs: 3.5 10*3/uL (ref 0.7–4.0)
MCH: 30.5 pg (ref 26.0–34.0)
MCHC: 33.8 g/dL (ref 30.0–36.0)
MCV: 90 fL (ref 80.0–100.0)
Monocytes Absolute: 0.8 10*3/uL (ref 0.1–1.0)
Monocytes Relative: 8 %
Neutro Abs: 5.7 10*3/uL (ref 1.7–7.7)
Neutrophils Relative %: 56 %
Platelets: 294 10*3/uL (ref 150–400)
RBC: 4.4 MIL/uL (ref 3.87–5.11)
RDW: 13.2 % (ref 11.5–15.5)
WBC: 10.2 10*3/uL (ref 4.0–10.5)
nRBC: 0 % (ref 0.0–0.2)

## 2021-10-19 LAB — CBG MONITORING, ED: Glucose-Capillary: 353 mg/dL — ABNORMAL HIGH (ref 70–99)

## 2021-10-19 MED ORDER — SODIUM CHLORIDE 0.9 % IV BOLUS
1000.0000 mL | Freq: Once | INTRAVENOUS | Status: AC
  Administered 2021-10-19: 1000 mL via INTRAVENOUS

## 2021-10-19 NOTE — ED Triage Notes (Signed)
Pt brought in by [facility] from home with c/o hyperglycemia. Pt saw her doctor yesterday for the same. EMS reports her CBG is 446. EMS reports BP 175/89, HR 68, resp 14, pt alert and oriented.

## 2021-10-19 NOTE — ED Provider Notes (Signed)
[facility] EMERGENCY DEPARTMENT Provider Note   CSN: [id number] Arrival date & time: 10/19/21  1823     History Chief Complaint  Patient presents with   Hyperglycemia    MASIYA CLAASSEN is a 55 y.o. female.  Patient presents to the emergency department for evaluation of elevated blood sugar.  Patient reports that her blood sugars have been running high for some time.  She was recently diagnosed with sarcoidosis and has been on long-term prednisone.  Patient reports that even before the prednisone, her sugars were on the high side, but she has been running into the 300s regularly.  Today, however, blood sugar was over 500.  Patient reports dry mouth, thirst, frequent urination.  No fever, chills, infectious symptoms.      Past Medical History:  Diagnosis Date   Complication of anesthesia    woke up during endoscopy and colonoscopy   Depression    Hemorrhoids    History of esophageal stricture    2011  peptic w/  dilatation   History of gastritis    2011   History of melanoma excision    2015  left leg/   12-09-2015 back    Hypertension    Microhematuria    Migraines    Nephrolithiasis    left side non-obstructive    Right ureteral stone    Sarcoidosis 07/24/2021   Urgency of urination    Wears contact lenses     Patient Active Problem List   Diagnosis Date Noted   Uncontrolled type 2 diabetes mellitus with hyperglycemia ([place]) 10/03/2021   Sarcoidosis 08/14/2021   Lipoma 03/09/2021   GERD (gastroesophageal reflux disease) 07/27/2017   Anxiety and depression 12/18/2016   Venous stasis dermatitis of both lower extremities 06/09/2016   IBS (irritable bowel syndrome) 03/18/2016   Melanoma of skin ([place]) 03/18/2016   Headache, chronic migraine without aura 01/31/2013   Hypertension 01/31/2013    Past Surgical History:  Procedure Laterality Date   ABDOMINAL HYSTERECTOMY     CARPAL TUNNEL RELEASE Right 01-05- 2015   COLONOSCOPY N/A 08/27/2015   Procedure:  COLONOSCOPY;  Surgeon: Danie Binder, MD;  [facility];  Service: Endoscopy;  Laterality: N/A;  0830   CYST EXCISION  2012     right hand   CYSTOSCOPY W/ URETERAL STENT PLACEMENT Right 12/22/2015   Procedure: CYSTOSCOPY WITH RETROGRADE PYELOGRAM/URETERAL STENT PLACEMENT;  Surgeon: Nickie Retort, MD;  Location: [facility];  Service: Urology;  Laterality: Right;   CYSTOSCOPY WITH RETROGRADE PYELOGRAM, URETEROSCOPY AND STENT PLACEMENT Right 01/05/2016   Procedure: CYSTOSCOPY WITH RETROGRADE PYELOGRAM, URETEROSCOPY AND STENT REPLACEMENT;  Surgeon: Nickie Retort, MD;  Location: [facility];  Service: Urology;  Laterality: Right;   DILATION AND CURETTAGE OF UTERUS  03-12-2009   w/  Suction   double balloon enteroscopy     Dr. Arsenio Loader at [facility]: no erosions, no evidence of Crohn's disease, path without Crohn's.    EGD with push enteroscopy  02-12-2010    patent distal peptic stricture with diffuse antral erythema, normal D1 and D2    LAPAROSCOPIC ASSISTED VAGINAL HYSTERECTOMY  10-26-2014   w/  Bilateral Salpingoophorectomy   STONE EXTRACTION WITH BASKET Right 01/05/2016   Procedure: STONE EXTRACTION WITH BASKET;  Surgeon: Nickie Retort, MD;  Location: [facility];  Service: Urology;  Laterality: Right;   TONSILLECTOMY  1998  approx     OB History   No obstetric history on file.  Family History  Problem Relation Age of Onset   Diabetes Mother    Colon cancer Neg Hx     Social History   Tobacco Use   Smoking status: Never   Smokeless tobacco: Never  Vaping Use   Vaping Use: Never used  Substance Use Topics   Alcohol use: Yes    Comment: occassionally   Drug use: No    Home Medications Prior to Admission medications   Medication Sig Start Date End Date Taking? Authorizing Provider  albuterol (VENTOLIN HFA) 108 (90 Base) MCG/ACT inhaler Inhale 2 puffs into the lungs every 6 (six) hours as needed for wheezing or shortness of  breath. 08/27/21   Margaretha Seeds, MD  busPIRone (BUSPAR) 5 MG tablet TAKE 1 TABLET BY MOUTH THREE TIMES DAILY 03/25/21   Elvia Collum M, DO  calcium carbonate (TUMS - DOSED IN MG ELEMENTAL CALCIUM) 500 MG chewable tablet Chew 1,500 mg by mouth 2 (two) times daily as needed for indigestion or heartburn.    [provider]  Carboxymethylcellul-Glycerin (LUBRICATING EYE DROPS OP) Place 1 drop into both eyes daily as needed (dry eyes).    [provider]  cetirizine (ZYRTEC) 10 MG chewable tablet Chew 10 mg by mouth daily.    [provider]  Cholecalciferol (VITAMIN D) 50 MCG (2000 UT) tablet Take 2,000 Units by mouth daily.    [provider]  Chromium 1000 MCG TABS Take 1,000 mcg by mouth daily.    [provider]  CINNAMON PO Take 2,000 mg by mouth daily.    [provider]  Continuous Blood Gluc Sensor (FREESTYLE LIBRE 14 DAY SENSOR) MISC Use as directed to check blood sugar levels. 10/12/21   Coral Spikes, DO  Cyanocobalamin (B-12) 2500 MCG TABS Take 2,500 mcg by mouth daily.    [provider]  EPINEPHrine 0.3 mg/0.3 mL IJ SOAJ injection Inject 0.3 mg into the muscle as needed for anaphylaxis. 10/16/21   Loeffler, Adora Fridge, PA-C  hydrochlorothiazide (HYDRODIURIL) 25 MG tablet Take 1 tablet (25 mg total) by mouth daily. 09/25/21   Coral Spikes, DO  HYDROcodone-acetaminophen (NORCO/VICODIN) 5-325 MG tablet Take 1 tablet by mouth every 8 (eight) hours as needed for moderate pain. 10/02/21   Coral Spikes, DO  insulin aspart (NOVOLOG FLEXPEN) 100 UNIT/ML FlexPen 5 units with largest meal of the day. 10/12/21   Coral Spikes, DO  insulin glargine (LANTUS) 100 UNIT/ML Solostar Pen Inject 15 Units into the skin daily. 10/02/21   Coral Spikes, DO  Insulin Pen Needle 32G X 4 MM MISC Use as directed to administer insulin. 10/02/21   Coral Spikes, DO  losartan (COZAAR) 100 MG tablet TAKE 1 TABLET BY MOUTH ONCE DAILY. 09/25/21   Coral Spikes, DO   meclizine (ANTIVERT) 25 MG tablet Take 25 mg by mouth 3 (three) times daily as needed for dizziness.    [provider]  metFORMIN (GLUCOPHAGE) 850 MG tablet Take 1 tablet (850 mg total) by mouth 2 (two) times daily with a meal. 09/25/21   Cook, Barnie Del, DO  omeprazole (PRILOSEC) 40 MG capsule Take 1 capsule (40 mg total) by mouth daily. 10/01/21   Margaretha Seeds, MD  ondansetron (ZOFRAN-ODT) 4 MG disintegrating tablet Take 4 mg by mouth every 8 (eight) hours as needed for nausea or vomiting.    [provider]  PARoxetine (PAXIL) 40 MG tablet Take 1 tablet (40 mg total) by mouth every morning. 09/25/21  Lacinda Axon, Jayce G, DO  predniSONE (DELTASONE) 10 MG tablet 2 daily until feeling better then 1 daily with breakfast 08/14/21   Tanda Rockers, MD  predniSONE (DELTASONE) 10 MG tablet Take 4 tablets (40 mg total) by mouth daily with breakfast. Patient not taking: Reported on 10/12/2021 10/01/21 11/12/21  Margaretha Seeds, MD  Probiotic Product (PROBIOTIC PO) Take 1 capsule by mouth daily.    [provider]  prochlorperazine (COMPAZINE) 10 MG tablet Take 1 tablet (10 mg total) by mouth every 6 (six) hours as needed for nausea or vomiting. 07/13/21   Derek Jack, MD  rizatriptan (MAXALT-MLT) 10 MG disintegrating tablet Take 1 tablet (10 mg total) by mouth as needed for migraine. May repeat in 2 hours if needed. Max 2 per 24 hours 05/22/20   Elvia Collum M, DO  traZODone (DESYREL) 100 MG tablet Take 1 tablet (100 mg total) by mouth at bedtime as needed for sleep. 09/25/21   Coral Spikes, DO  valACYclovir (VALTREX) 500 MG tablet Take 500 mg by mouth 2 (two) times daily as needed (outbreaks). 06/05/21   [provider]    Allergies    Topamax [topiramate] and Sulfa antibiotics  Review of Systems   Review of Systems  Endocrine: Positive for polydipsia and polyuria.  All other systems reviewed and are negative.  Physical Exam Updated Vital Signs BP 115/77  (BP Location: Right Arm)    Pulse 68    Temp 98.6 F (37 C) (Oral)    Resp 19    Ht 5\' 3"  (1.6 m)    Wt 85 kg    LMP 10/26/2014    SpO2 95%    BMI 33.19 kg/m   Physical Exam Vitals and nursing note reviewed.  Constitutional:      General: She is not in acute distress.    Appearance: Normal appearance. She is well-developed.  HENT:     Head: Normocephalic and atraumatic.     Right Ear: Hearing normal.     Left Ear: Hearing normal.     Nose: Nose normal.  Eyes:     Conjunctiva/sclera: Conjunctivae normal.     Pupils: Pupils are equal, round, and reactive to light.  Cardiovascular:     Rate and Rhythm: Regular rhythm.     Heart sounds: S1 normal and S2 normal. No murmur heard.   No friction rub. No gallop.  Pulmonary:     Effort: Pulmonary effort is normal. No respiratory distress.     Breath sounds: Normal breath sounds.  Chest:     Chest wall: No tenderness.  Abdominal:     General: Bowel sounds are normal.     Palpations: Abdomen is soft.     Tenderness: There is no abdominal tenderness. There is no guarding or rebound. Negative signs include Murphy's sign and McBurney's sign.     Hernia: No hernia is present.  Musculoskeletal:        General: Normal range of motion.     Cervical back: Normal range of motion and neck supple.  Skin:    General: Skin is warm and dry.     Findings: No rash.  Neurological:     Mental Status: She is alert and oriented to person, place, and time.     GCS: GCS eye subscore is 4. GCS verbal subscore is 5. GCS motor subscore is 6.     Cranial Nerves: No cranial nerve deficit.     Sensory: No sensory deficit.  Coordination: Coordination normal.  Psychiatric:        Speech: Speech normal.        Behavior: Behavior normal.        Thought Content: Thought content normal.    ED Results / Procedures / Treatments   Labs (all labs ordered are listed, but only abnormal results are displayed) Labs Reviewed  CBC WITH DIFFERENTIAL/PLATELET -  Abnormal; Notable for the following components:      Result Value   Abs Immature Granulocytes 0.08 (*)    All other components within normal limits  BASIC METABOLIC PANEL - Abnormal; Notable for the following components:   Glucose, Bld 239 (*)    All other components within normal limits  URINALYSIS, ROUTINE W REFLEX MICROSCOPIC - Abnormal; Notable for the following components:   Glucose, UA >=500 (*)    All other components within normal limits  URINALYSIS, MICROSCOPIC (REFLEX) - Abnormal; Notable for the following components:   Bacteria, UA FEW (*)    All other components within normal limits  CBG MONITORING, ED - Abnormal; Notable for the following components:   Glucose-Capillary 353 (*)    All other components within normal limits  CBG MONITORING, ED - Abnormal; Notable for the following components:   Glucose-Capillary 175 (*)    All other components within normal limits    EKG None  Radiology No results found.  Procedures Procedures   Medications Ordered in ED Medications  sodium chloride 0.9 % bolus 1,000 mL (0 mLs Intravenous Stopped 10/20/21 0112)    ED Course  I have reviewed the triage vital signs and the nursing notes.  Pertinent labs & imaging results that were available during my care of the patient were reviewed by me and considered in my medical decision making (see chart for details).    MDM Rules/Calculators/A&P                         Patient presents to the emergency department for evaluation of elevated blood sugar.  Patient experiencing persistently elevated blood sugar secondary to prednisone use due to recent diagnosis of sarcoidosis.  Her primary care physician has been adjusting her insulin but she has been persistently elevated.  Patient's glucose is significantly improved after IV fluids here in the emergency department.  No sign of DKA.  No other identified reason for her elevated blood sugar.  Will discharge and have her contact her primary care  physician for further instructions.  Recommend short acting sliding scale insulin, as patient will likely be coming off of the prednisone and continuing to elevate long-acting insulin may result in hypoglycemia eventually.     Final Clinical Impression(s) / ED Diagnoses Final diagnoses:  Hyperglycemia    Rx / DC Orders ED Discharge Orders     None        Symone Cornman, Gwenyth Allegra, MD 10/20/21 [phone]

## 2021-10-20 ENCOUNTER — Telehealth: Payer: Self-pay

## 2021-10-20 LAB — BASIC METABOLIC PANEL
Anion gap: 9 (ref 5–15)
BUN: 15 mg/dL (ref 6–20)
CO2: 28 mmol/L (ref 22–32)
Calcium: 9.1 mg/dL (ref 8.9–10.3)
Chloride: 103 mmol/L (ref 98–111)
Creatinine, Ser: 0.63 mg/dL (ref 0.44–1.00)
GFR, Estimated: >60 mL/min (ref >60)
Glucose, Bld: 239 mg/dL — ABNORMAL HIGH (ref 70–99)
Potassium: 3.7 mmol/L (ref 3.5–5.1)
Sodium: 140 mmol/L (ref 135–145)

## 2021-10-20 LAB — URINALYSIS, ROUTINE W REFLEX MICROSCOPIC
Bilirubin Urine: NEGATIVE
Glucose, UA: >=500 mg/dL — AB
Hgb urine dipstick: NEGATIVE
Ketones, ur: NEGATIVE mg/dL
Leukocytes,Ua: NEGATIVE
Nitrite: NEGATIVE
Protein, ur: NEGATIVE mg/dL
Specific Gravity, Urine: 1.025 (ref 1.005–1.030)
pH: 6 (ref 5.0–8.0)

## 2021-10-20 LAB — URINALYSIS, MICROSCOPIC (REFLEX)

## 2021-10-20 LAB — CBG MONITORING, ED: Glucose-Capillary: 175 mg/dL — ABNORMAL HIGH (ref 70–99)

## 2021-10-20 NOTE — Telephone Encounter (Signed)
Transition Care Management Follow-up Telephone Call Date of discharge and from where: 10/20/2021 from [facility] How have you been since you were released from the hospital? Pt stated that she is feeling better and did not have any questions or concerns.  Any questions or concerns? No  Items Reviewed: Did the pt receive and understand the discharge instructions provided? Yes  Medications obtained and verified? Yes  Other? No  Any new allergies since your discharge? No  Dietary orders reviewed? No Do you have support at home? Yes   Functional Questionnaire: (I = Independent and D = Dependent) ADLs: I  Bathing/Dressing- I  Meal Prep- I  Eating- I  Maintaining continence- I  Transferring/Ambulation- I  Managing Meds- I   Follow up appointments reviewed:  PCP Hospital f/u appt confirmed? Yes  Scheduled to see Thersa Salt, DO on 11/02/2021 @ 10:40am. [facility] f/u appt confirmed? No   Are transportation arrangements needed? No  If their condition worsens, is the pt aware to call PCP or go to the Emergency Dept.? Yes Was the patient provided with contact information for the PCP's office or ED? Yes Was to pt encouraged to call back with questions or concerns? Yes

## 2021-10-20 NOTE — Discharge Instructions (Signed)
Your blood sugar is running high because of the prednisone.  Please contact your doctor in the morning to make further adjustments to your diabetes treatment to prevent these high blood sugars.

## 2021-10-21 ENCOUNTER — Encounter: Payer: Self-pay | Admitting: Family Medicine

## 2021-10-21 ENCOUNTER — Telehealth: Payer: Self-pay | Admitting: Family Medicine

## 2021-10-21 DIAGNOSIS — E1165 Type 2 diabetes mellitus with hyperglycemia: Secondary | ICD-10-CM

## 2021-10-21 NOTE — Telephone Encounter (Signed)
Patient just took her sugar readings and it was 458 . She had to go to ER on 12/26 due to her sugar being 438. Please advise

## 2021-10-21 NOTE — Telephone Encounter (Signed)
Nurses #1 need to clarify with patient how much insulin she is utilizing currently How much is she taking of the long-acting insulin daily? How much of the short acting is she taking with meals? Also it would be wise to schedule her an appointment next week with Dr. Darrel Reach needs to be moved up What has her readings been over the past couple days preferably needing to know some of the readings at breakfast/fasting time at mealtimes

## 2021-10-21 NOTE — Telephone Encounter (Signed)
Nurses I instructed the patient to do 34 units Lantus She will also do NovoLog as follows She will do 6 units of NovoLog currently With breakfast and supper (she states she eats 2 meals per day) 5 units with breakfast and 5 units with supper More than likely this will have to be adjusted on a regular basis until the best dosing is found I will send her a MyChart message She will send [place] a MyChart message back in the morning very important that I see that message Finally she has an appointment with Dr. Lacinda Axon next week Go ahead with a referral to nurse diabetic educator with Dr. Liliane Channel practice

## 2021-10-21 NOTE — Telephone Encounter (Signed)
PA redone for Freestyle. Per Danae Chen, no fax from [place] at this time unless they came in yesterday when she was out.

## 2021-10-21 NOTE — Telephone Encounter (Signed)
Pt states she is shaky, dizzy, swimmy-headed, has had bad headache all day. Pt is on Prednisone for Sarcoidosis by pulmonologist Dr.Ellison. pt is currently taking Metformin 850 BID and took 31 units of Lantus. Pt has not began Novolog at this time. Pt went to ER via ambulance on 10/19/21 with sugar of 483. Please advise. Thank you  (Pt is suppose to be going to [facility] but needs papers filled out by Dr.Cook. also needs PA for [facility] but has not heard from insurance. Will check on status of both)

## 2021-10-21 NOTE — Telephone Encounter (Signed)
Pt only taking Lantus at this time-took 31 units this morning. Pt states Dr.Cook told her to hold off on Novolog. Pt eats a breakfast bowl in the morning to take Prednisone. Sometimes she eats a low carb protein bar for lunch. Today only ate breakfast. Monday morning 132; noon 359; 5pm 483; Tuesday morning 169; after 7 pm 339; today at 9 244; 402 at noon, 3:19 pm 458. Pt appt moved up to 10/27/21 with Dr.Cook. please advise. Thank you.

## 2021-10-21 NOTE — Telephone Encounter (Signed)
error 

## 2021-10-22 MED ORDER — INSULIN GLARGINE 100 UNIT/ML SOLOSTAR PEN: 15.0000 [IU] | PEN_INJECTOR | Freq: Every day | SUBCUTANEOUS | 1 refills | Status: DC

## 2021-10-22 MED ORDER — NOVOLOG FLEXPEN 100 UNIT/ML ~~LOC~~ SOPN: PEN_INJECTOR | SUBCUTANEOUS | 1 refills | Status: DC

## 2021-10-22 NOTE — Telephone Encounter (Signed)
2nd attempt to get [facility] covered; denied. Fax states that we may consider approval of this device after a trial of two certain other devices such as Dexcom G6 product line and Freestyle Libre 2 product line have been tried and failed.

## 2021-10-22 NOTE — Telephone Encounter (Signed)
Referral to diabetic educator placed. Will send my chart message to provider once received from patient. Will update direction in Epic for insulin.

## 2021-10-27 ENCOUNTER — Other Ambulatory Visit: Payer: Self-pay

## 2021-10-27 ENCOUNTER — Ambulatory Visit: Payer: Medicaid Other | Admitting: Family Medicine

## 2021-10-27 ENCOUNTER — Encounter: Payer: Self-pay | Admitting: Family Medicine

## 2021-10-27 DIAGNOSIS — R051 Acute cough: Secondary | ICD-10-CM

## 2021-10-27 DIAGNOSIS — R059 Cough, unspecified: Secondary | ICD-10-CM | POA: Insufficient documentation

## 2021-10-27 DIAGNOSIS — E1165 Type 2 diabetes mellitus with hyperglycemia: Secondary | ICD-10-CM | POA: Diagnosis not present

## 2021-10-27 MED ORDER — INSULIN GLARGINE 100 UNIT/ML SOLOSTAR PEN: 50.0000 [IU] | PEN_INJECTOR | Freq: Every day | SUBCUTANEOUS | 1 refills | Status: DC

## 2021-10-27 MED ORDER — FREESTYLE LIBRE 2 SENSOR MISC: 3 refills | Status: DC

## 2021-10-27 MED ORDER — BENZONATATE 200 MG PO CAPS: 200.0000 mg | ORAL_CAPSULE | Freq: Three times a day (TID) | ORAL | 0 refills | Status: DC | PRN

## 2021-10-27 MED ORDER — FREESTYLE LIBRE 2 READER DEVI: 0 refills | Status: DC

## 2021-10-27 NOTE — Assessment & Plan Note (Signed)
Uncontrolled.  Worsening in the setting of increased corticosteroid use.  Increasing Lantus to 50 units daily.  Increasing NovoLog use to 04/30/09.  Follow-up in 2 weeks.  May need to see endocrinology.

## 2021-10-27 NOTE — Patient Instructions (Signed)
Lantus 50 units.  Increase the Novolog to 04/30/09.  Follow up in 2 weeks (or sooner if needed).

## 2021-10-27 NOTE — Progress Notes (Signed)
Subjective:  Patient ID: Courtney Hale, female    DOB: 1966/01/16  Age: 56 y.o. MRN: [id number]  CC: Chief Complaint  Patient presents with   Diabetes    Forms from [place] not yet received Sugars elevated Having cough and throat pain since Sunday     HPI:  56 year old female with type 2 diabetes and recent diagnosis of sarcoidosis on corticosteroids presents for follow-up regarding her type 2 diabetes.  Patient's blood sugars continue to be uncontrolled despite increasing insulin.  She has had to go to the ER recently due to markedly elevated blood sugars.  Patient is compliant with her current medications.  She has titrated up to 44 units of Lantus.  She is on NovoLog as well (4/5/7).  Fastings are coming down but she is still having elevated fastings ranging from 127-156.  Postprandials are markedly uncontrolled in the 300s.  She endorses compliance with therapy.  She is on metformin as well.  Patient also notes recent cough and sore throat which started on Sunday.  Patient Active Problem List   Diagnosis Date Noted   Cough 10/27/2021   Uncontrolled type 2 diabetes mellitus with hyperglycemia ([place]) 10/03/2021   Sarcoidosis 08/14/2021   Lipoma 03/09/2021   GERD (gastroesophageal reflux disease) 07/27/2017   Anxiety and depression 12/18/2016   Venous stasis dermatitis of both lower extremities 06/09/2016   IBS (irritable bowel syndrome) 03/18/2016   Melanoma of skin ([place]) 03/18/2016   Headache, chronic migraine without aura 01/31/2013   Hypertension 01/31/2013    Social Hx   Social History   Socioeconomic History   Marital status: Single    Spouse name: Not on file   Number of children: Not on file   Years of education: Not on file   Highest education level: Not on file  Occupational History   Not on file  Tobacco Use   Smoking status: Never   Smokeless tobacco: Never  Vaping Use   Vaping Use: Never used  Substance and Sexual Activity   Alcohol use: Yes     Comment: occassionally   Drug use: No   Sexual activity: Not on file  Other Topics Concern   Not on file  Social History Narrative   Not on file   Social Determinants of Health   Financial Resource Strain: Low Risk    Difficulty of Paying Living Expenses: Not hard at all  Food Insecurity: No Food Insecurity   Worried About Charity fundraiser in the Last Year: Never true   Arboriculturist in the Last Year: Never true  Transportation Needs: No Transportation Needs   Lack of Transportation (Medical): No   Lack of Transportation (Non-Medical): No  Physical Activity: Inactive   Days of Exercise per Week: 0 days   Minutes of Exercise per Session: 0 min  Stress: Stress Concern Present   Feeling of Stress : Very much  Social Connections: Socially Isolated   Frequency of Communication with Friends and Family: More than three times a week   Frequency of Social Gatherings with Friends and Family: More than three times a week   Attends Religious Services: Never   Marine scientist or Organizations: No   Attends Archivist Meetings: Never   Marital Status: Never married    Review of Systems Per HPI  Objective:  BP 128/72    Pulse 88    Temp 99.2 F (37.3 C)    Wt 190 lb 9.6 oz (86.5 kg)  LMP 10/26/2014    SpO2 97%    BMI 33.76 kg/m   BP/Weight 10/27/2021 10/20/2021 68/12/2120  Systolic BP 482 500 -  Diastolic BP 72 77 -  Wt. (Lbs) 190.6 - 187.39  BMI 33.76 - 33.19    Physical Exam Vitals and nursing note reviewed.  Constitutional:      General: She is not in acute distress.    Appearance: Normal appearance. She is not ill-appearing.  HENT:     Head: Normocephalic and atraumatic.  Cardiovascular:     Rate and Rhythm: Normal rate and regular rhythm.  Pulmonary:     Effort: Pulmonary effort is normal.     Breath sounds: Normal breath sounds. No wheezing, rhonchi or rales.  Neurological:     Mental Status: She is alert.  Psychiatric:        Mood and  Affect: Mood normal.        Behavior: Behavior normal.    Lab Results  Component Value Date   WBC 10.2 10/19/2021   HGB 13.4 10/19/2021   HCT 39.6 10/19/2021   PLT 294 10/19/2021   GLUCOSE 239 (H) 10/19/2021   CHOL 258 (H) 09/25/2021   TRIG 169 (H) 09/25/2021   HDL 59 09/25/2021   LDLCALC 168 (H) 09/25/2021   ALT 36 08/26/2021   AST 24 08/26/2021   NA 140 10/19/2021   K 3.7 10/19/2021   CL 103 10/19/2021   CREATININE 0.63 10/19/2021   BUN 15 10/19/2021   CO2 28 10/19/2021   TSH 0.996 08/27/2020   INR 0.9 07/21/2021   HGBA1C 7.9 (H) 09/25/2021     Assessment & Plan:   Problem List Items Addressed This Visit       Endocrine   Uncontrolled type 2 diabetes mellitus with hyperglycemia ([place])    Uncontrolled.  Worsening in the setting of increased corticosteroid use.  Increasing Lantus to 50 units daily.  Increasing NovoLog use to 04/30/09.  Follow-up in 2 weeks.  May need to see endocrinology.      Relevant Medications   insulin glargine (LANTUS) 100 UNIT/ML Solostar Pen     Other   Cough    Patient likely experiencing a viral illness.  Throat was clear.  Mild cough.  Tessalon Perles as needed.       Meds ordered this encounter  Medications   Continuous Blood Gluc Receiver (FREESTYLE LIBRE 2 READER) DEVI    Sig: Use as directed to check blood sugars.    Dispense:  1 each    Refill:  0   Continuous Blood Gluc Sensor (FREESTYLE LIBRE 2 SENSOR) MISC    Sig: Use sensor as directed. Change every 10-14 days.    Dispense:  4 each    Refill:  3   insulin glargine (LANTUS) 100 UNIT/ML Solostar Pen    Sig: Inject 50 Units into the skin daily.    Dispense:  45 mL    Refill:  1   benzonatate (TESSALON) 200 MG capsule    Sig: Take 1 capsule (200 mg total) by mouth 3 (three) times daily as needed for cough.    Dispense:  30 capsule    Refill:  0    Follow-up:  Return in about 2 weeks (around 11/10/2021).  [place]

## 2021-10-27 NOTE — Assessment & Plan Note (Signed)
Patient likely experiencing a viral illness.  Throat was clear.  Mild cough.  Tessalon Perles as needed.

## 2021-11-02 ENCOUNTER — Ambulatory Visit: Payer: Medicaid Other | Admitting: Family Medicine

## 2021-11-03 ENCOUNTER — Other Ambulatory Visit: Payer: Self-pay

## 2021-11-03 ENCOUNTER — Ambulatory Visit (INDEPENDENT_AMBULATORY_CARE_PROVIDER_SITE_OTHER): Payer: Medicaid Other | Admitting: Family Medicine

## 2021-11-03 ENCOUNTER — Encounter: Payer: Self-pay | Admitting: Family Medicine

## 2021-11-03 VITALS — BP 112/69 | HR 89 | Temp 97.5°F | Ht 63.0 in | Wt 194.0 lb

## 2021-11-03 DIAGNOSIS — E1165 Type 2 diabetes mellitus with hyperglycemia: Secondary | ICD-10-CM

## 2021-11-03 DIAGNOSIS — J209 Acute bronchitis, unspecified: Secondary | ICD-10-CM

## 2021-11-03 MED ORDER — DOXYCYCLINE HYCLATE 100 MG PO TABS: 100.0000 mg | ORAL_TABLET | Freq: Two times a day (BID) | ORAL | 0 refills | Status: DC

## 2021-11-03 NOTE — Patient Instructions (Addendum)
Antibiotic as prescribed.  Continue the metformin and lantus. Hold the short acting insulin if sugars remain well controlled.   Follow up in 2 weeks to 1 month.  Take care  Dr. Lacinda Axon

## 2021-11-04 DIAGNOSIS — L244 Irritant contact dermatitis due to drugs in contact with skin: Secondary | ICD-10-CM | POA: Diagnosis not present

## 2021-11-04 DIAGNOSIS — J209 Acute bronchitis, unspecified: Secondary | ICD-10-CM | POA: Insufficient documentation

## 2021-11-04 NOTE — Progress Notes (Signed)
Subjective:  Patient ID: Courtney Hale, female    DOB: [birth year]  Age: 56 y.o. MRN: [id number]  CC: Chief Complaint  Patient presents with   Cough    Green mucous x 2 weeks, blood sugars fluctuating from 300 to 60's last night , right arm drainage from bx    HPI:  56 year old female with type 2 diabetes, sarcoidosis on chronic corticosteroids, hypertension presents with the above complaints.  Ongoing cough for 2 weeks.  Worsening.  Not improving.  Productive of green sputum.  Patient also reports pain and redness associated with her recent skin cancer treatment site.  No fever.  No relieving factors.  Patient also notes recent hypoglycemia with blood sugar in the 60s.  Patient Active Problem List   Diagnosis Date Noted   Acute bronchitis 11/04/2021   Uncontrolled type 2 diabetes mellitus with hyperglycemia ([place]) 10/03/2021   Sarcoidosis 08/14/2021   Lipoma 03/09/2021   GERD (gastroesophageal reflux disease) 07/27/2017   Anxiety and depression 12/18/2016   IBS (irritable bowel syndrome) 03/18/2016   Melanoma of skin ([place]) 03/18/2016   Headache, chronic migraine without aura 01/31/2013   Hypertension 01/31/2013    Social Hx   Social History   Socioeconomic History   Marital status: Single    Spouse name: Not on file   Number of children: Not on file   Years of education: Not on file   Highest education level: Not on file  Occupational History   Not on file  Tobacco Use   Smoking status: Never   Smokeless tobacco: Never  Vaping Use   Vaping Use: Never used  Substance and Sexual Activity   Alcohol use: Yes    Comment: occassionally   Drug use: No   Sexual activity: Not on file  Other Topics Concern   Not on file  Social History Narrative   Not on file   Social Determinants of Health   Financial Resource Strain: Low Risk    Difficulty of Paying Living Expenses: Not hard at all  Food Insecurity: No Food Insecurity   Worried About Charity fundraiser in the  Last Year: Never true   Ran Out of Food in the Last Year: Never true  Transportation Needs: No Transportation Needs   Lack of Transportation (Medical): No   Lack of Transportation (Non-Medical): No  Physical Activity: Inactive   Days of Exercise per Week: 0 days   Minutes of Exercise per Session: 0 min  Stress: Stress Concern Present   Feeling of Stress : Very much  Social Connections: Socially Isolated   Frequency of Communication with Friends and Family: More than three times a week   Frequency of Social Gatherings with Friends and Family: More than three times a week   Attends Religious Services: Never   Marine scientist or Organizations: No   Attends Archivist Meetings: Never   Marital Status: Never married    Review of Systems Per HPI  Objective:  BP 112/69    Pulse 89    Temp (!) 97.5 F (36.4 C)    Ht 5\' 3"  (1.6 m)    Wt 194 lb (88 kg)    LMP 10/26/2014    SpO2 97%    BMI 34.37 kg/m   BP/Weight 11/03/2021 10/27/2021 35/70/1779  Systolic BP 390 300 923  Diastolic BP 69 72 77  Wt. (Lbs) 194 190.6 -  BMI 34.37 33.76 -    Physical Exam Vitals and nursing note reviewed.  Constitutional:      General: She is not in acute distress.    Appearance: Normal appearance. She is not ill-appearing.  HENT:     Head: Normocephalic and atraumatic.  Cardiovascular:     Rate and Rhythm: Normal rate and regular rhythm.     Heart sounds: No murmur heard. Pulmonary:     Effort: Pulmonary effort is normal.     Breath sounds: Normal breath sounds. No wheezing or rales.  Neurological:     Mental Status: She is alert.  Psychiatric:        Mood and Affect: Mood normal.        Behavior: Behavior normal.    Lab Results  Component Value Date   WBC 10.2 10/19/2021   HGB 13.4 10/19/2021   HCT 39.6 10/19/2021   PLT 294 10/19/2021   GLUCOSE 239 (H) 10/19/2021   CHOL 258 (H) 09/25/2021   TRIG 169 (H) 09/25/2021   HDL 59 09/25/2021   LDLCALC 168 (H) 09/25/2021   ALT  36 08/26/2021   AST 24 08/26/2021   NA 140 10/19/2021   K 3.7 10/19/2021   CL 103 10/19/2021   CREATININE 0.63 10/19/2021   BUN 15 10/19/2021   CO2 28 10/19/2021   TSH 0.996 08/27/2020   INR 0.9 07/21/2021   HGBA1C 7.9 (H) 09/25/2021     Assessment & Plan:   Problem List Items Addressed This Visit       Respiratory   Acute bronchitis - Primary    Persistent cough which is productive.  Immunosuppressed.  Placing on doxycycline.        Endocrine   Uncontrolled type 2 diabetes mellitus with hyperglycemia ([facility])    Recent hypoglycemia.  Advised to cut back on use of rapid acting insulin.  Close monitoring.       Meds ordered this encounter  Medications   doxycycline (VIBRA-TABS) 100 MG tablet    Sig: Take 1 tablet (100 mg total) by mouth 2 (two) times daily.    Dispense:  20 tablet    Refill:  0    Follow-up:  Return for 2 weeks to 1 month .  [place]

## 2021-11-04 NOTE — Assessment & Plan Note (Signed)
Persistent cough which is productive.  Immunosuppressed.  Placing on doxycycline.

## 2021-11-04 NOTE — Assessment & Plan Note (Signed)
Recent hypoglycemia.  Advised to cut back on use of rapid acting insulin.  Close monitoring.

## 2021-11-05 ENCOUNTER — Encounter: Payer: Self-pay | Admitting: *Deleted

## 2021-11-05 ENCOUNTER — Telehealth: Payer: Self-pay | Admitting: Internal Medicine

## 2021-11-05 ENCOUNTER — Ambulatory Visit: Payer: Medicaid Other | Admitting: Internal Medicine

## 2021-11-05 ENCOUNTER — Other Ambulatory Visit: Payer: Self-pay

## 2021-11-05 ENCOUNTER — Encounter: Payer: Self-pay | Admitting: Internal Medicine

## 2021-11-05 VITALS — BP 129/79 | HR 84 | Temp 96.8°F | Ht 63.0 in | Wt 194.8 lb

## 2021-11-05 DIAGNOSIS — R1013 Epigastric pain: Secondary | ICD-10-CM

## 2021-11-05 DIAGNOSIS — D869 Sarcoidosis, unspecified: Secondary | ICD-10-CM | POA: Diagnosis not present

## 2021-11-05 DIAGNOSIS — K625 Hemorrhage of anus and rectum: Secondary | ICD-10-CM | POA: Diagnosis not present

## 2021-11-05 DIAGNOSIS — K219 Gastro-esophageal reflux disease without esophagitis: Secondary | ICD-10-CM | POA: Diagnosis not present

## 2021-11-05 MED ORDER — OMEPRAZOLE 40 MG PO CPDR: 40.0000 mg | DELAYED_RELEASE_CAPSULE | Freq: Two times a day (BID) | ORAL | 5 refills | Status: DC

## 2021-11-05 MED ORDER — CLENPIQ 10-3.5-12 MG-GM -GM/160ML PO SOLN: 1.0000 | Freq: Once | ORAL | 0 refills | Status: AC

## 2021-11-05 NOTE — Progress Notes (Signed)
Referring Provider: Coral Spikes, DO Primary Care Physician:  Coral Spikes, DO Primary GI:  Dr. Abbey Chatters  Chief Complaint  Patient presents with   Abdominal Pain    Left side of abd   loose stool   Gastroesophageal Reflux   Bloated    HPI:   Courtney Hale is a 56 y.o. female who presents to clinic today for follow-up visit.  Last seen February 2021 in our clinic.  She has a history of irritable bowel syndrome, diarrhea predominant.  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  She did have concern for possible metastatic melanoma but bone biopsy consistent with sarcoid.  She follows with [facility] and is chronically on prednisone at present.  She may require methotrexate in the future.  Today she states that she has new onset left lower quadrant abdominal pain.  She states this is different from her previous abdominal pain evaluated prior.  Also with rectal bleeding.  Believes her hemorrhoids are acting up.  Also with uncontrolled acid reflux.  She was recently restarted on omeprazole daily and states this is helping some though she continues to have breakthrough symptoms including epigastric pain.  No dysphagia odynophagia.  Last colonoscopy 2016 unremarkable besides internal hemorrhoids.  Past Medical History:  Diagnosis Date   Complication of anesthesia    woke up during endoscopy and colonoscopy   Depression    Hemorrhoids    History of esophageal stricture    2011  peptic w/  dilatation   History of gastritis    2011   History of melanoma excision    2015  left leg/   12-09-2015 back    Hypertension    Microhematuria    Migraines    Nephrolithiasis    left side non-obstructive    Right ureteral stone    Sarcoidosis 07/24/2021   Urgency of urination    Wears contact lenses     Past Surgical History:  Procedure Laterality Date   ABDOMINAL HYSTERECTOMY     CARPAL TUNNEL RELEASE Right  01-05- 2015   COLONOSCOPY N/A 08/27/2015   Procedure: COLONOSCOPY;  Surgeon: Danie Binder, MD;  Location: [facility];  Service: Endoscopy;  Laterality: N/A;  0830   CYST EXCISION  2012     right hand   CYSTOSCOPY W/ URETERAL STENT PLACEMENT Right 12/22/2015   Procedure: CYSTOSCOPY WITH RETROGRADE PYELOGRAM/URETERAL STENT PLACEMENT;  Surgeon: Nickie Retort, MD;  Location: [facility];  Service: Urology;  Laterality: Right;   CYSTOSCOPY WITH RETROGRADE PYELOGRAM, URETEROSCOPY AND STENT PLACEMENT Right 01/05/2016   Procedure: CYSTOSCOPY WITH RETROGRADE PYELOGRAM, URETEROSCOPY AND STENT REPLACEMENT;  Surgeon: Nickie Retort, MD;  Location: [facility];  Service: Urology;  Laterality: Right;   DILATION AND CURETTAGE OF UTERUS  03-12-2009   w/  Suction   double balloon enteroscopy     Dr. Arsenio Loader at [facility]: no erosions, no evidence of Crohn's disease, path without Crohn's.    EGD with push enteroscopy  02-12-2010    patent distal peptic stricture with diffuse antral erythema, normal D1 and D2    LAPAROSCOPIC ASSISTED VAGINAL HYSTERECTOMY  10-26-2014   w/  Bilateral Salpingoophorectomy   STONE EXTRACTION WITH BASKET Right 01/05/2016   Procedure: STONE EXTRACTION WITH BASKET;  Surgeon: Nickie Retort, MD;  Location: [facility];  Service: Urology;  Laterality: Right;   TONSILLECTOMY  1998  approx  Current Outpatient Medications  Medication Sig Dispense Refill   acetaminophen (TYLENOL) 500 MG tablet Take 1,500 mg by mouth every 6 (six) hours as needed.     albuterol (VENTOLIN HFA) 108 (90 Base) MCG/ACT inhaler Inhale 2 puffs into the lungs every 6 (six) hours as needed for wheezing or shortness of breath. 8 g 6   benzonatate (TESSALON) 200 MG capsule Take 1 capsule (200 mg total) by mouth 3 (three) times daily as needed for cough. 30 capsule 0   busPIRone (BUSPAR) 5 MG tablet TAKE 1 TABLET BY MOUTH THREE TIMES DAILY 45 tablet 2   calcium carbonate (TUMS -  DOSED IN MG ELEMENTAL CALCIUM) 500 MG chewable tablet Chew 1,500 mg by mouth 2 (two) times daily as needed for indigestion or heartburn.     Carboxymethylcellul-Glycerin (LUBRICATING EYE DROPS OP) Place 1 drop into both eyes daily as needed (dry eyes).     cetirizine (ZYRTEC) 10 MG chewable tablet Chew 10 mg by mouth daily.     Cholecalciferol (VITAMIN D) 50 MCG (2000 UT) tablet Take 2,000 Units by mouth daily.     Chromium 1000 MCG TABS Take 1,000 mcg by mouth daily.     CINNAMON PO Take 2,000 mg by mouth daily.     Continuous Blood Gluc Receiver (FREESTYLE LIBRE 2 READER) DEVI Use as directed to check blood sugars. 1 each 0   Continuous Blood Gluc Sensor (FREESTYLE LIBRE 2 SENSOR) MISC Use sensor as directed. Change every 10-14 days. 4 each 3   Cyanocobalamin (B-12) 2500 MCG TABS Take 2,500 mcg by mouth daily.     doxycycline (VIBRA-TABS) 100 MG tablet Take 1 tablet (100 mg total) by mouth 2 (two) times daily. 20 tablet 0   EPINEPHrine 0.3 mg/0.3 mL IJ SOAJ injection Inject 0.3 mg into the muscle as needed for anaphylaxis. 1 each 0   hydrochlorothiazide (HYDRODIURIL) 25 MG tablet Take 1 tablet (25 mg total) by mouth daily. 90 tablet 1   HYDROcodone-acetaminophen (NORCO/VICODIN) 5-325 MG tablet Take 1 tablet by mouth every 8 (eight) hours as needed for moderate pain. 30 tablet 0   ibuprofen (ADVIL) 200 MG tablet Take 600-800 mg by mouth every 6 (six) hours as needed.     insulin aspart (NOVOLOG FLEXPEN) 100 UNIT/ML FlexPen 6 units with largest meal of the day. 5 units with breakfast and 5 units with supper (Patient taking differently: 7 units at breakfast and 7 units at lunch, 10 units at supper) 3 mL 1   insulin glargine (LANTUS) 100 UNIT/ML Solostar Pen Inject 50 Units into the skin daily. 45 mL 1   Insulin Pen Needle 32G X 4 MM MISC Use as directed to administer insulin. 100 each 1   losartan (COZAAR) 100 MG tablet TAKE 1 TABLET BY MOUTH ONCE DAILY. 90 tablet 1   meclizine (ANTIVERT) 25 MG  tablet Take 25 mg by mouth 3 (three) times daily as needed for dizziness.     meloxicam (MOBIC) 7.5 MG tablet Take 7.5 mg by mouth as needed for pain.     metFORMIN (GLUCOPHAGE) 850 MG tablet Take 1 tablet (850 mg total) by mouth 2 (two) times daily with a meal. 180 tablet 1   ondansetron (ZOFRAN-ODT) 4 MG disintegrating tablet Take 4 mg by mouth every 8 (eight) hours as needed for nausea or vomiting.     PARoxetine (PAXIL) 40 MG tablet Take 1 tablet (40 mg total) by mouth every morning. 90 tablet 1   predniSONE (DELTASONE) 10 MG tablet Take  4 tablets (40 mg total) by mouth daily with breakfast. 168 tablet 0   Probiotic Product (PROBIOTIC PO) Take 1 capsule by mouth daily.     prochlorperazine (COMPAZINE) 10 MG tablet Take 1 tablet (10 mg total) by mouth every 6 (six) hours as needed for nausea or vomiting. 30 tablet 2   rizatriptan (MAXALT-MLT) 10 MG disintegrating tablet Take 1 tablet (10 mg total) by mouth as needed for migraine. May repeat in 2 hours if needed. Max 2 per 24 hours 10 tablet 1   traZODone (DESYREL) 100 MG tablet Take 1 tablet (100 mg total) by mouth at bedtime as needed for sleep. (Patient taking differently: Take 50 mg by mouth at bedtime as needed for sleep.) 90 tablet 1   valACYclovir (VALTREX) 500 MG tablet Take 500 mg by mouth 2 (two) times daily as needed (outbreaks).     omeprazole (PRILOSEC) 40 MG capsule Take 1 capsule (40 mg total) by mouth 2 (two) times daily before a meal. 60 capsule 5   No current facility-administered medications for this visit.    Allergies as of 11/05/2021 - Review Complete 11/05/2021  Allergen Reaction Noted   Topamax [topiramate] Other (See Comments) 12/18/2016   Sulfa antibiotics Other (See Comments) 12/05/2012    Family History  Problem Relation Age of Onset   Diabetes Mother    Colon cancer Neg Hx     Social History   Socioeconomic History   Marital status: Single    Spouse name: Not on file   Number of children: Not on file    Years of education: Not on file   Highest education level: Not on file  Occupational History   Not on file  Tobacco Use   Smoking status: Never   Smokeless tobacco: Never  Vaping Use   Vaping Use: Never used  Substance and Sexual Activity   Alcohol use: Yes    Comment: occassionally   Drug use: No   Sexual activity: Not on file  Other Topics Concern   Not on file  Social History Narrative   Not on file   Social Determinants of Health   Financial Resource Strain: Low Risk    Difficulty of Paying Living Expenses: Not hard at all  Food Insecurity: No Food Insecurity   Worried About Charity fundraiser in the Last Year: Never true   Ran Out of Food in the Last Year: Never true  Transportation Needs: No Transportation Needs   Lack of Transportation (Medical): No   Lack of Transportation (Non-Medical): No  Physical Activity: Inactive   Days of Exercise per Week: 0 days   Minutes of Exercise per Session: 0 min  Stress: Stress Concern Present   Feeling of Stress : Very much  Social Connections: Socially Isolated   Frequency of Communication with Friends and Family: More than three times a week   Frequency of Social Gatherings with Friends and Family: More than three times a week   Attends Religious Services: Never   Marine scientist or Organizations: No   Attends Archivist Meetings: Never   Marital Status: Never married    Subjective: Review of Systems  Constitutional:  Negative for chills and fever.  HENT:  Negative for congestion and hearing loss.   Eyes:  Negative for blurred vision and double vision.  Respiratory:  Negative for cough and shortness of breath.   Cardiovascular:  Negative for chest pain and palpitations.  Gastrointestinal:  Positive for abdominal pain, blood in  stool and heartburn. Negative for constipation, diarrhea, melena and vomiting.  Genitourinary:  Negative for dysuria and urgency.  Musculoskeletal:  Negative for joint pain and  myalgias.  Skin:  Negative for itching and rash.  Neurological:  Negative for dizziness and headaches.  Psychiatric/Behavioral:  Negative for depression. The patient is not nervous/anxious.     Objective: BP 129/79    Pulse 84    Temp (!) 96.8 F (36 C) (Temporal)    Ht 5\' 3"  (1.6 m)    Wt 194 lb 12.8 oz (88.4 kg)    LMP 10/26/2014    BMI 34.51 kg/m  Physical Exam Constitutional:      Appearance: Normal appearance.  HENT:     Head: Normocephalic and atraumatic.  Eyes:     Extraocular Movements: Extraocular movements intact.     Conjunctiva/sclera: Conjunctivae normal.  Cardiovascular:     Rate and Rhythm: Normal rate and regular rhythm.  Pulmonary:     Effort: Pulmonary effort is normal.     Breath sounds: Normal breath sounds.  Abdominal:     General: Bowel sounds are normal.     Palpations: Abdomen is soft.  Musculoskeletal:        General: No swelling. Normal range of motion.     Cervical back: Normal range of motion and neck supple.  Skin:    General: Skin is warm and dry.     Coloration: Skin is not jaundiced.  Neurological:     General: No focal deficit present.     Mental Status: She is alert and oriented to person, place, and time.  Psychiatric:        Mood and Affect: Mood normal.        Behavior: Behavior normal.     Assessment: *GERD-not well controlled *Epigastric pain *Left lower quadrant abdominal pain *Rectal bleeding  Plan: GERD not well controlled on omeprazole daily.  Will increase to twice daily for the next 12 weeks and see how she does.    Given her breakthrough symptoms as well as epigastric pain, Will schedule for EGD to evaluate for peptic ulcer disease, esophagitis, gastritis, H. Pylori, duodenitis, or other. Will also evaluate for esophageal stricture, Schatzki's ring, esophageal web or other.   At the same time we will perform colonoscopy to evaluate her left lower quadrant abdominal pain as well as rectal bleeding.  The risks including  infection, bleed, or perforation as well as benefits, limitations, alternatives and imponderables have been reviewed with the patient. Potential for esophageal dilation, biopsy, etc. have also been reviewed.  Questions have been answered. All parties agreeable.  Further recommendations to follow  11/05/2021 9:36 AM   Disclaimer: This note was dictated with voice recognition software. Similar sounding words can inadvertently be transcribed and may not be corrected upon review.

## 2021-11-05 NOTE — Telephone Encounter (Signed)
Called pt, received message she is currently not accepting calls at this time

## 2021-11-05 NOTE — Telephone Encounter (Signed)
PLEASE CALL PATIENT, SHE HAS A QUESTION ABOUT THE PROCEDURE AND HER BEING A DIABETIC

## 2021-11-05 NOTE — H&P (View-Only) (Signed)
Referring Provider: Coral Spikes, DO Primary Care Physician:  Coral Spikes, DO Primary GI:  Dr. Abbey Chatters  Chief Complaint  Patient presents with   Abdominal Pain    Left side of abd   loose stool   Gastroesophageal Reflux   Bloated    HPI:   Courtney Hale is a 56 y.o. female who presents to clinic today for follow-up visit.  Last seen February 2021 in our clinic.  She has a history of irritable bowel syndrome, diarrhea predominant.  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  She did have concern for possible metastatic melanoma but bone biopsy consistent with sarcoid.  She follows with [facility] and is chronically on prednisone at present.  She may require methotrexate in the future.  Today she states that she has new onset left lower quadrant abdominal pain.  She states this is different from her previous abdominal pain evaluated prior.  Also with rectal bleeding.  Believes her hemorrhoids are acting up.  Also with uncontrolled acid reflux.  She was recently restarted on omeprazole daily and states this is helping some though she continues to have breakthrough symptoms including epigastric pain.  No dysphagia odynophagia.  Last colonoscopy 2016 unremarkable besides internal hemorrhoids.  Past Medical History:  Diagnosis Date   Complication of anesthesia    woke up during endoscopy and colonoscopy   Depression    Hemorrhoids    History of esophageal stricture    2011  peptic w/  dilatation   History of gastritis    2011   History of melanoma excision    2015  left leg/   12-09-2015 back    Hypertension    Microhematuria    Migraines    Nephrolithiasis    left side non-obstructive    Right ureteral stone    Sarcoidosis 07/24/2021   Urgency of urination    Wears contact lenses     Past Surgical History:  Procedure Laterality Date   ABDOMINAL HYSTERECTOMY     CARPAL TUNNEL RELEASE Right  01-05- 2015   COLONOSCOPY N/A 08/27/2015   Procedure: COLONOSCOPY;  Surgeon: Danie Binder, MD;  Location: [facility];  Service: Endoscopy;  Laterality: N/A;  0830   CYST EXCISION  2012     right hand   CYSTOSCOPY W/ URETERAL STENT PLACEMENT Right 12/22/2015   Procedure: CYSTOSCOPY WITH RETROGRADE PYELOGRAM/URETERAL STENT PLACEMENT;  Surgeon: Nickie Retort, MD;  Location: [facility];  Service: Urology;  Laterality: Right;   CYSTOSCOPY WITH RETROGRADE PYELOGRAM, URETEROSCOPY AND STENT PLACEMENT Right 01/05/2016   Procedure: CYSTOSCOPY WITH RETROGRADE PYELOGRAM, URETEROSCOPY AND STENT REPLACEMENT;  Surgeon: Nickie Retort, MD;  Location: [facility];  Service: Urology;  Laterality: Right;   DILATION AND CURETTAGE OF UTERUS  03-12-2009   w/  Suction   double balloon enteroscopy     Dr. Arsenio Loader at [facility]: no erosions, no evidence of Crohn's disease, path without Crohn's.    EGD with push enteroscopy  02-12-2010    patent distal peptic stricture with diffuse antral erythema, normal D1 and D2    LAPAROSCOPIC ASSISTED VAGINAL HYSTERECTOMY  10-26-2014   w/  Bilateral Salpingoophorectomy   STONE EXTRACTION WITH BASKET Right 01/05/2016   Procedure: STONE EXTRACTION WITH BASKET;  Surgeon: Nickie Retort, MD;  Location: [facility];  Service: Urology;  Laterality: Right;   TONSILLECTOMY  1998  approx  Current Outpatient Medications  Medication Sig Dispense Refill   acetaminophen (TYLENOL) 500 MG tablet Take 1,500 mg by mouth every 6 (six) hours as needed.     albuterol (VENTOLIN HFA) 108 (90 Base) MCG/ACT inhaler Inhale 2 puffs into the lungs every 6 (six) hours as needed for wheezing or shortness of breath. 8 g 6   benzonatate (TESSALON) 200 MG capsule Take 1 capsule (200 mg total) by mouth 3 (three) times daily as needed for cough. 30 capsule 0   busPIRone (BUSPAR) 5 MG tablet TAKE 1 TABLET BY MOUTH THREE TIMES DAILY 45 tablet 2   calcium carbonate (TUMS -  DOSED IN MG ELEMENTAL CALCIUM) 500 MG chewable tablet Chew 1,500 mg by mouth 2 (two) times daily as needed for indigestion or heartburn.     Carboxymethylcellul-Glycerin (LUBRICATING EYE DROPS OP) Place 1 drop into both eyes daily as needed (dry eyes).     cetirizine (ZYRTEC) 10 MG chewable tablet Chew 10 mg by mouth daily.     Cholecalciferol (VITAMIN D) 50 MCG (2000 UT) tablet Take 2,000 Units by mouth daily.     Chromium 1000 MCG TABS Take 1,000 mcg by mouth daily.     CINNAMON PO Take 2,000 mg by mouth daily.     Continuous Blood Gluc Receiver (FREESTYLE LIBRE 2 READER) DEVI Use as directed to check blood sugars. 1 each 0   Continuous Blood Gluc Sensor (FREESTYLE LIBRE 2 SENSOR) MISC Use sensor as directed. Change every 10-14 days. 4 each 3   Cyanocobalamin (B-12) 2500 MCG TABS Take 2,500 mcg by mouth daily.     doxycycline (VIBRA-TABS) 100 MG tablet Take 1 tablet (100 mg total) by mouth 2 (two) times daily. 20 tablet 0   EPINEPHrine 0.3 mg/0.3 mL IJ SOAJ injection Inject 0.3 mg into the muscle as needed for anaphylaxis. 1 each 0   hydrochlorothiazide (HYDRODIURIL) 25 MG tablet Take 1 tablet (25 mg total) by mouth daily. 90 tablet 1   HYDROcodone-acetaminophen (NORCO/VICODIN) 5-325 MG tablet Take 1 tablet by mouth every 8 (eight) hours as needed for moderate pain. 30 tablet 0   ibuprofen (ADVIL) 200 MG tablet Take 600-800 mg by mouth every 6 (six) hours as needed.     insulin aspart (NOVOLOG FLEXPEN) 100 UNIT/ML FlexPen 6 units with largest meal of the day. 5 units with breakfast and 5 units with supper (Patient taking differently: 7 units at breakfast and 7 units at lunch, 10 units at supper) 3 mL 1   insulin glargine (LANTUS) 100 UNIT/ML Solostar Pen Inject 50 Units into the skin daily. 45 mL 1   Insulin Pen Needle 32G X 4 MM MISC Use as directed to administer insulin. 100 each 1   losartan (COZAAR) 100 MG tablet TAKE 1 TABLET BY MOUTH ONCE DAILY. 90 tablet 1   meclizine (ANTIVERT) 25 MG  tablet Take 25 mg by mouth 3 (three) times daily as needed for dizziness.     meloxicam (MOBIC) 7.5 MG tablet Take 7.5 mg by mouth as needed for pain.     metFORMIN (GLUCOPHAGE) 850 MG tablet Take 1 tablet (850 mg total) by mouth 2 (two) times daily with a meal. 180 tablet 1   ondansetron (ZOFRAN-ODT) 4 MG disintegrating tablet Take 4 mg by mouth every 8 (eight) hours as needed for nausea or vomiting.     PARoxetine (PAXIL) 40 MG tablet Take 1 tablet (40 mg total) by mouth every morning. 90 tablet 1   predniSONE (DELTASONE) 10 MG tablet Take  4 tablets (40 mg total) by mouth daily with breakfast. 168 tablet 0   Probiotic Product (PROBIOTIC PO) Take 1 capsule by mouth daily.     prochlorperazine (COMPAZINE) 10 MG tablet Take 1 tablet (10 mg total) by mouth every 6 (six) hours as needed for nausea or vomiting. 30 tablet 2   rizatriptan (MAXALT-MLT) 10 MG disintegrating tablet Take 1 tablet (10 mg total) by mouth as needed for migraine. May repeat in 2 hours if needed. Max 2 per 24 hours 10 tablet 1   traZODone (DESYREL) 100 MG tablet Take 1 tablet (100 mg total) by mouth at bedtime as needed for sleep. (Patient taking differently: Take 50 mg by mouth at bedtime as needed for sleep.) 90 tablet 1   valACYclovir (VALTREX) 500 MG tablet Take 500 mg by mouth 2 (two) times daily as needed (outbreaks).     omeprazole (PRILOSEC) 40 MG capsule Take 1 capsule (40 mg total) by mouth 2 (two) times daily before a meal. 60 capsule 5   No current facility-administered medications for this visit.    Allergies as of 11/05/2021 - Review Complete 11/05/2021  Allergen Reaction Noted   Topamax [topiramate] Other (See Comments) 12/18/2016   Sulfa antibiotics Other (See Comments) 12/05/2012    Family History  Problem Relation Age of Onset   Diabetes Mother    Colon cancer Neg Hx     Social History   Socioeconomic History   Marital status: Single    Spouse name: Not on file   Number of children: Not on file    Years of education: Not on file   Highest education level: Not on file  Occupational History   Not on file  Tobacco Use   Smoking status: Never   Smokeless tobacco: Never  Vaping Use   Vaping Use: Never used  Substance and Sexual Activity   Alcohol use: Yes    Comment: occassionally   Drug use: No   Sexual activity: Not on file  Other Topics Concern   Not on file  Social History Narrative   Not on file   Social Determinants of Health   Financial Resource Strain: Low Risk    Difficulty of Paying Living Expenses: Not hard at all  Food Insecurity: No Food Insecurity   Worried About Charity fundraiser in the Last Year: Never true   Ran Out of Food in the Last Year: Never true  Transportation Needs: No Transportation Needs   Lack of Transportation (Medical): No   Lack of Transportation (Non-Medical): No  Physical Activity: Inactive   Days of Exercise per Week: 0 days   Minutes of Exercise per Session: 0 min  Stress: Stress Concern Present   Feeling of Stress : Very much  Social Connections: Socially Isolated   Frequency of Communication with Friends and Family: More than three times a week   Frequency of Social Gatherings with Friends and Family: More than three times a week   Attends Religious Services: Never   Marine scientist or Organizations: No   Attends Archivist Meetings: Never   Marital Status: Never married    Subjective: Review of Systems  Constitutional:  Negative for chills and fever.  HENT:  Negative for congestion and hearing loss.   Eyes:  Negative for blurred vision and double vision.  Respiratory:  Negative for cough and shortness of breath.   Cardiovascular:  Negative for chest pain and palpitations.  Gastrointestinal:  Positive for abdominal pain, blood in  stool and heartburn. Negative for constipation, diarrhea, melena and vomiting.  Genitourinary:  Negative for dysuria and urgency.  Musculoskeletal:  Negative for joint pain and  myalgias.  Skin:  Negative for itching and rash.  Neurological:  Negative for dizziness and headaches.  Psychiatric/Behavioral:  Negative for depression. The patient is not nervous/anxious.     Objective: BP 129/79    Pulse 84    Temp (!) 96.8 F (36 C) (Temporal)    Ht 5\' 3"  (1.6 m)    Wt 194 lb 12.8 oz (88.4 kg)    LMP 10/26/2014    BMI 34.51 kg/m  Physical Exam Constitutional:      Appearance: Normal appearance.  HENT:     Head: Normocephalic and atraumatic.  Eyes:     Extraocular Movements: Extraocular movements intact.     Conjunctiva/sclera: Conjunctivae normal.  Cardiovascular:     Rate and Rhythm: Normal rate and regular rhythm.  Pulmonary:     Effort: Pulmonary effort is normal.     Breath sounds: Normal breath sounds.  Abdominal:     General: Bowel sounds are normal.     Palpations: Abdomen is soft.  Musculoskeletal:        General: No swelling. Normal range of motion.     Cervical back: Normal range of motion and neck supple.  Skin:    General: Skin is warm and dry.     Coloration: Skin is not jaundiced.  Neurological:     General: No focal deficit present.     Mental Status: She is alert and oriented to person, place, and time.  Psychiatric:        Mood and Affect: Mood normal.        Behavior: Behavior normal.     Assessment: *GERD-not well controlled *Epigastric pain *Left lower quadrant abdominal pain *Rectal bleeding  Plan: GERD not well controlled on omeprazole daily.  Will increase to twice daily for the next 12 weeks and see how she does.    Given her breakthrough symptoms as well as epigastric pain, Will schedule for EGD to evaluate for peptic ulcer disease, esophagitis, gastritis, H. Pylori, duodenitis, or other. Will also evaluate for esophageal stricture, Schatzki's ring, esophageal web or other.   At the same time we will perform colonoscopy to evaluate her left lower quadrant abdominal pain as well as rectal bleeding.  The risks including  infection, bleed, or perforation as well as benefits, limitations, alternatives and imponderables have been reviewed with the patient. Potential for esophageal dilation, biopsy, etc. have also been reviewed.  Questions have been answered. All parties agreeable.  Further recommendations to follow  11/05/2021 9:36 AM   Disclaimer: This note was dictated with voice recognition software. Similar sounding words can inadvertently be transcribed and may not be corrected upon review.

## 2021-11-05 NOTE — Patient Instructions (Signed)
We will schedule you for upper endoscopy and colonoscopy to further evaluate your abdominal pain, hemorrhoids, sarcoidosis.  In the meantime I am going to increase your omeprazole to twice daily and have sent this to your pharmacy.  This medication works best if you take it 30 minutes before breakfast and 30 minutes before supper.  Was nice meeting you today.  Dr. Abbey Chatters  At [facility] we value your feedback. You may receive a survey about your visit today. Please share your experience as we strive to create trusting relationships with our patients to provide genuine, compassionate, quality care.  We appreciate your understanding and patience as we review any laboratory studies, imaging, and other diagnostic tests that are ordered as we care for you. Our office policy is 5 business days for review of these results, and any emergent or urgent results are addressed in a timely manner for your best interest. If you do not hear from our office in 1 week, please contact us.   We also encourage the use of MyChart, which contains your medical information for your review as well. If you are not enrolled in this feature, an access code is on this after visit summary for your convenience. Thank you for allowing [place] to be involved in your care.  It was great to see you today!  I hope you have a great rest of your Winter!    Elon Alas. Abbey Chatters, D.O. Gastroenterology and Hepatology [facility]

## 2021-11-10 ENCOUNTER — Other Ambulatory Visit: Payer: Self-pay

## 2021-11-10 ENCOUNTER — Ambulatory Visit: Payer: Medicaid Other | Admitting: Pulmonary Disease

## 2021-11-10 ENCOUNTER — Ambulatory Visit: Payer: Medicaid Other | Admitting: Family Medicine

## 2021-11-10 ENCOUNTER — Telehealth: Payer: Self-pay | Admitting: Pulmonary Disease

## 2021-11-10 ENCOUNTER — Encounter: Payer: Self-pay | Admitting: Pulmonary Disease

## 2021-11-10 VITALS — BP 116/70 | HR 81 | Temp 98.9°F | Ht 63.5 in | Wt 196.4 lb

## 2021-11-10 DIAGNOSIS — D849 Immunodeficiency, unspecified: Secondary | ICD-10-CM

## 2021-11-10 DIAGNOSIS — Z79899 Other long term (current) drug therapy: Secondary | ICD-10-CM

## 2021-11-10 DIAGNOSIS — Z111 Encounter for screening for respiratory tuberculosis: Secondary | ICD-10-CM

## 2021-11-10 DIAGNOSIS — D869 Sarcoidosis, unspecified: Secondary | ICD-10-CM | POA: Diagnosis not present

## 2021-11-10 MED ORDER — PREDNISONE 10 MG PO TABS: 40.0000 mg | ORAL_TABLET | Freq: Every day | ORAL | 0 refills | Status: DC

## 2021-11-10 NOTE — Telephone Encounter (Signed)
REFER to pharmacy for initiation of methotrexate for sarcoid 10mg  along with slow steroid taper

## 2021-11-10 NOTE — Patient Instructions (Signed)

## 2021-11-10 NOTE — Progress Notes (Signed)
Subjective:   PATIENT ID: Courtney Hale GENDER: female DOB: [birth year], MRN: [id number]   HPI  Chief Complaint  Patient presents with   Follow-up    Sarcoid    Reason for Visit: Follow-up  Courtney Hale is a 56 year old female with history of melanoma s/p skin resections with DM2 who presents for sarcoidosis.  Synopsis: She was initially referred to [facility] shortness of breath with exertion and at rest.  She was seen by Dr. Melvyn Novas for sarcoid. She has been followed by Dr. Delton Coombes in hematology for multiple bone lesions after presenting for left hip pain since February 2022. PET demonstrated hypermetabolic mediastinal lymphadenopathy and right iliac, right sacral ala and anterior left frontal bone lesions. She underwent bone biopsy of right iliac bone lesion on 07/21/2021 which returned with noncaseating granulomatous inflammation. Dr. Melvyn Novas started patient on 20 mg prednisone and transferred to my care in November 2022.  08/27/21 Yesterday she presented to the ED for acute on chronic atypical chest pain that was worsening.  EKG negative for ischemia.  ED PA evaluated and had low suspicion for ACS.  Started on PPI for reflux and referred to GI. Since starting the steroids she reports she is unsure if it has been effective. She has shortness of breath with panic attacks. Sometimes unable to take a deep breath. Occasional dry cough.  10/01/21 She reports that she is unclear if her current steroid dose is helping. She has used her inhaler for shortness of breath and uses albuterol 2-3 times a day for two days a week. She continues to have left hip pain and left head pain. Right side doesn't bother her. She reports seeing out of her left eye with blurriness and double vision at times which she attributes to facial swelling and pain.   11/10/20 She saw her Ophthalmologist this morning. Reports no sarcoid but diagnosed with bilateral glaucoma. Bone pain in her left head and hip and  has not improved despite steroids. She continues to have shortness of breath and using albuterol as needed 2-3 times a week. She was recently treated on doxycycline for productive cough.  Past Medical History:  Diagnosis Date   Complication of anesthesia    woke up during endoscopy and colonoscopy   Depression    Hemorrhoids    History of esophageal stricture    2011  peptic w/  dilatation   History of gastritis    2011   History of melanoma excision    2015  left leg/   12-09-2015 back    Hypertension    Microhematuria    Migraines    Nephrolithiasis    left side non-obstructive    Right ureteral stone    Sarcoidosis 07/24/2021   Urgency of urination    Wears contact lenses      Family History  Problem Relation Age of Onset   Diabetes Mother    Colon cancer Neg Hx      Social History   Occupational History   Not on file  Tobacco Use   Smoking status: Never   Smokeless tobacco: Never  Vaping Use   Vaping Use: Never used  Substance and Sexual Activity   Alcohol use: Yes    Comment: occassionally   Drug use: No   Sexual activity: Not on file    Allergies  Allergen Reactions   Topamax [Topiramate] Other (See Comments)    Developed kidney stones   Fluorouracil Swelling    Burning skin  Sulfa Antibiotics Other (See Comments)    Burning sensation, flushing to skin     Outpatient Medications Prior to Visit  Medication Sig Dispense Refill   acetaminophen (TYLENOL) 500 MG tablet Take 1,000 mg by mouth every 6 (six) hours as needed for moderate pain.     albuterol (VENTOLIN HFA) 108 (90 Base) MCG/ACT inhaler Inhale 2 puffs into the lungs every 6 (six) hours as needed for wheezing or shortness of breath. 8 g 6   benzonatate (TESSALON) 200 MG capsule Take 1 capsule (200 mg total) by mouth 3 (three) times daily as needed for cough. 30 capsule 0   busPIRone (BUSPAR) 5 MG tablet TAKE 1 TABLET BY MOUTH THREE TIMES DAILY (Patient taking differently: Take 5 mg by mouth 3  (three) times daily as needed (anxiety).) 45 tablet 2   calcium carbonate (TUMS - DOSED IN MG ELEMENTAL CALCIUM) 500 MG chewable tablet Chew 1,500 mg by mouth 2 (two) times daily as needed for indigestion or heartburn.     Carboxymethylcellul-Glycerin (LUBRICATING EYE DROPS OP) Place 1 drop into both eyes daily as needed (dry eyes).     cetirizine (ZYRTEC) 10 MG chewable tablet Chew 10 mg by mouth daily.     Cholecalciferol (VITAMIN D) 50 MCG (2000 UT) tablet Take 2,000 Units by mouth daily.     Chromium 1000 MCG TABS Take 1,000 mcg by mouth daily.     CINNAMON PO Take 2,000 mg by mouth daily.     Continuous Blood Gluc Receiver (FREESTYLE LIBRE 2 READER) DEVI Use as directed to check blood sugars. 1 each 0   Continuous Blood Gluc Sensor (FREESTYLE LIBRE 2 SENSOR) MISC Use sensor as directed. Change every 10-14 days. 4 each 3   Cyanocobalamin (B-12) 2000 MCG TABS Take 2,000 mcg by mouth daily.     doxycycline (VIBRA-TABS) 100 MG tablet Take 1 tablet (100 mg total) by mouth 2 (two) times daily. 20 tablet 0   EPINEPHrine 0.3 mg/0.3 mL IJ SOAJ injection Inject 0.3 mg into the muscle as needed for anaphylaxis. 1 each 0   hydrochlorothiazide (HYDRODIURIL) 25 MG tablet Take 1 tablet (25 mg total) by mouth daily. 90 tablet 1   HYDROcodone-acetaminophen (NORCO/VICODIN) 5-325 MG tablet Take 1 tablet by mouth every 8 (eight) hours as needed for moderate pain. 30 tablet 0   ibuprofen (ADVIL) 200 MG tablet Take 600-800 mg by mouth every 6 (six) hours as needed for moderate pain or headache.     insulin aspart (NOVOLOG FLEXPEN) 100 UNIT/ML FlexPen 6 units with largest meal of the day. 5 units with breakfast and 5 units with supper (Patient taking differently: Inject 7-10 Units into the skin See admin instructions. 7 units at breakfast and 7 units at lunch, 10 units at supper) 3 mL 1   insulin glargine (LANTUS) 100 UNIT/ML Solostar Pen Inject 50 Units into the skin daily. 45 mL 1   Insulin Pen Needle 32G X 4 MM  MISC Use as directed to administer insulin. 100 each 1   losartan (COZAAR) 100 MG tablet TAKE 1 TABLET BY MOUTH ONCE DAILY. 90 tablet 1   meclizine (ANTIVERT) 25 MG tablet Take 25 mg by mouth 3 (three) times daily as needed for dizziness.     meloxicam (MOBIC) 7.5 MG tablet Take 7.5 mg by mouth daily as needed (migraine).     metFORMIN (GLUCOPHAGE) 850 MG tablet Take 1 tablet (850 mg total) by mouth 2 (two) times daily with a meal. 180 tablet 1  omeprazole (PRILOSEC) 40 MG capsule Take 1 capsule (40 mg total) by mouth 2 (two) times daily before a meal. 60 capsule 5   ondansetron (ZOFRAN-ODT) 4 MG disintegrating tablet Take 4 mg by mouth every 8 (eight) hours as needed for nausea or vomiting.     PARoxetine (PAXIL) 40 MG tablet Take 1 tablet (40 mg total) by mouth every morning. 90 tablet 1   predniSONE (DELTASONE) 10 MG tablet Take 4 tablets (40 mg total) by mouth daily with breakfast. 168 tablet 0   Probiotic Product (PROBIOTIC PO) Take 1 capsule by mouth daily.     prochlorperazine (COMPAZINE) 10 MG tablet Take 1 tablet (10 mg total) by mouth every 6 (six) hours as needed for nausea or vomiting. 30 tablet 2   rizatriptan (MAXALT-MLT) 10 MG disintegrating tablet Take 1 tablet (10 mg total) by mouth as needed for migraine. May repeat in 2 hours if needed. Max 2 per 24 hours 10 tablet 1   traZODone (DESYREL) 100 MG tablet Take 1 tablet (100 mg total) by mouth at bedtime as needed for sleep. 90 tablet 1   traZODone (DESYREL) 50 MG tablet Take 50 mg by mouth at bedtime.     valACYclovir (VALTREX) 500 MG tablet Take 500 mg by mouth 2 (two) times daily as needed (outbreaks).     No facility-administered medications prior to visit.    Review of Systems  Constitutional:  Negative for chills, diaphoresis, fever, malaise/fatigue and weight loss.  HENT:  Negative for congestion.   Eyes:  Positive for blurred vision and double vision.  Respiratory:  Positive for shortness of breath. Negative for cough,  hemoptysis, sputum production and wheezing.   Cardiovascular:  Negative for chest pain, palpitations and leg swelling.  Musculoskeletal:  Positive for joint pain.    Objective:   Vitals:   11/10/21 1148  BP: 116/70  Pulse: 81  Temp: 98.9 F (37.2 C)  TempSrc: Oral  SpO2: 94%  Weight: 196 lb 6.4 oz (89.1 kg)  Height: 5' 3.5" (1.613 m)  SpO2: 94 % O2 Device: None (Room air)  Physical Exam: General: Well-appearing, no acute distress HENT: [place], AT Eyes: EOMI, no scleral icterus Respiratory: Clear to auscultation bilaterally.  No crackles, wheezing or rales Cardiovascular: RRR, -M/R/G, no JVD Extremities:-Edema,-tenderness Neuro: AAO x4, CNII-XII grossly intact Psych: Normal mood, normal affect  Data Reviewed:  Imaging: PET 2/95/18 - Hypermetabolic bilateral hilar and mediastinal lymph nodules, right ilac lesion, right sacral ala, right scapular spine, left scapular spine and left frontal bone  PFT: 09/30/21 FVC 2.56 (73%) FEV1 2.20 (81%) Ratio 86  TLC 71% DLCO 112% Interpretation: Mild restrictive defect with normal DLCO.  Labs: CMP Latest Ref Rng & Units 10/19/2021 08/26/2021 06/30/2021  Glucose 70 - 99 mg/dL 239(H) 198(H) 82  BUN 6 - 20 mg/dL '15 18 12  ' Creatinine 0.44 - 1.00 mg/dL 0.63 0.66 0.59  Sodium 135 - 145 mmol/L 140 135 139  Potassium 3.5 - 5.1 mmol/L 3.7 3.7 3.4(L)  Chloride 98 - 111 mmol/L 103 101 107  CO2 22 - 32 mmol/L '28 24 24  ' Calcium 8.9 - 10.3 mg/dL 9.1 9.4 9.0  Total Protein 6.5 - 8.1 g/dL - 6.8 6.7  Total Bilirubin 0.3 - 1.2 mg/dL - 0.4 0.3  Alkaline Phos 38 - 126 U/L - 76 73  AST 15 - 41 U/L - 24 20  ALT 0 - 44 U/L - 36 24   CBC    Component Value Date/Time   WBC 10.2  10/19/2021 2336   RBC 4.40 10/19/2021 2336   HGB 13.4 10/19/2021 2336   HGB 14.2 01/26/2021 1429   HCT 39.6 10/19/2021 2336   HCT 41.7 01/26/2021 1429   PLT 294 10/19/2021 2336   PLT 320 01/26/2021 1429   MCV 90.0 10/19/2021 2336   MCV 87 01/26/2021 1429   MCH 30.5  10/19/2021 2336   MCHC 33.8 10/19/2021 2336   RDW 13.2 10/19/2021 2336   RDW 12.8 01/26/2021 1429   LYMPHSABS 3.5 10/19/2021 2336   LYMPHSABS 3.7 (H) 01/26/2021 1429   MONOABS 0.8 10/19/2021 2336   EOSABS 0.0 10/19/2021 2336   EOSABS 0.1 01/26/2021 1429   BASOSABS 0.0 10/19/2021 2336   BASOSABS 0.0 01/26/2021 1429   08/14/21 ACE 33 ESR  20    Assessment & Plan:   Discussion: 56 year old female with sarcoid, hx of melanoma s/p skin resections, DM2 who presents for follow-up of sarcoidosis with lung and bone involvement.  We discussed the clinical course of sarcoid and management including serial PFTs, labs, eye exam, and EKG and chest imaging if indicated. If symptoms suggest sarcoid flare in the future, we would manage with steroids +/- biologics.   Symptoms remain persistent on prednisone. Fortunately no sarcoid eye involvement per optho. We discussed risks and benefits of advancing immunosuppression to methotrexate. Given her symptoms, will proceed with methotrexate.  Pulmonary sarcoidosis with bone involvement - Dx in 06/2021 via right iliac bone biopsy - Immunosuppression with prednisone - Annual PFTs.  Last PFTs - 09/2021 - Recommend annual ophthalmology exam 11/10/21 - EKG reviewed. No evidence of conduction abnormalities.   Pulmonary sarcoid with restrictive defect Shortness of breath --CONTINUE Albuterol TWO puffs as needed for shortness of breath  Chronic immunosuppression --08/14/21 Treated with prednisone 20 mg ~8 weeks --10/01/21 - today ~ 6 weeks of prednisone 40 mg --REFER to pharmacy for initiation of methotrexate. Coordinate care with pharmacy team --Plan to wean steroids once methotrexate dosing stable --CONTINUE omeprazole 20 mg daily --Will need QuantiFERON-TB in future  Bone sarcoid involvement --Immunosuppression as above  Health Maintenance Immunization History  Administered Date(s) Administered   Influenza,inj,Quad PF,6+ Mos 09/09/2016,  08/29/2017, 09/27/2019, 10/30/2020, 08/14/2021   Influenza-Unspecified 08/22/2013, 07/22/2014, 08/08/2015   MMR 01/22/2011   PFIZER(Purple Top)SARS-COV-2 Vaccination 12/30/2019, 01/20/2020   Tdap 01/22/2011, 04/17/2014   CT Lung Screen - not indicated. Insufficient tobacco history  No orders of the defined types were placed in this encounter.  Meds ordered this encounter  Medications   predniSONE (DELTASONE) 10 MG tablet    Sig: Take 4 tablets (40 mg total) by mouth daily with breakfast.    Dispense:  360 tablet    Refill:  0   Return in about 7 weeks (around 12/28/2021).  I have spent a total time of 45-minutes on the day of the appointment reviewing prior documentation, coordinating care and discussing medical diagnosis and plan with the patient/family. Past medical history, allergies, medications were reviewed. Pertinent imaging, labs and tests included in this note have been reviewed and interpreted independently by me.  [place]el[place], MD [facility] 11/10/2021 11:52 AM  Office Number [phone]

## 2021-11-10 NOTE — Patient Instructions (Signed)
Pulmonary sarcoid with restrictive defect Shortness of breath --CONTINUE Albuterol TWO puffs as needed for shortness of breath  Chronic immunosuppression --08/14/21 Treated with prednisone 20 mg ~8 weeks --10/01/21 - today ~ 6 weeks of prednisone 40 mg --REFER to pharmacy for initiation of methotrexate --CONTINUE omeprazole 20 mg daily --Will need QuantiFERON-TB in future  Bone and pulmonary sarcoid involvement --Immunosuppression as above  Follow-up with me in 6 weeks

## 2021-11-11 ENCOUNTER — Ambulatory Visit: Payer: Medicaid Other | Admitting: Nurse Practitioner

## 2021-11-11 ENCOUNTER — Encounter: Payer: Self-pay | Admitting: Nurse Practitioner

## 2021-11-11 ENCOUNTER — Ambulatory Visit: Payer: Medicaid Other | Admitting: Family Medicine

## 2021-11-11 VITALS — BP 112/67 | HR 89 | Ht 62.5 in | Wt 195.2 lb

## 2021-11-11 DIAGNOSIS — E782 Mixed hyperlipidemia: Secondary | ICD-10-CM | POA: Diagnosis not present

## 2021-11-11 DIAGNOSIS — I10 Essential (primary) hypertension: Secondary | ICD-10-CM

## 2021-11-11 DIAGNOSIS — E1165 Type 2 diabetes mellitus with hyperglycemia: Secondary | ICD-10-CM

## 2021-11-11 MED ORDER — INSULIN GLARGINE 100 UNIT/ML SOLOSTAR PEN: 50.0000 [IU] | PEN_INJECTOR | Freq: Every day | SUBCUTANEOUS | 3 refills | Status: DC

## 2021-11-11 MED ORDER — NOVOLOG FLEXPEN 100 UNIT/ML ~~LOC~~ SOPN: 8.0000 [IU] | PEN_INJECTOR | Freq: Three times a day (TID) | SUBCUTANEOUS | 1 refills | Status: DC

## 2021-11-11 MED ORDER — INSULIN GLARGINE 100 UNIT/ML SOLOSTAR PEN: 35.0000 [IU] | PEN_INJECTOR | Freq: Every day | SUBCUTANEOUS | 3 refills | Status: DC

## 2021-11-11 NOTE — Progress Notes (Signed)
Subjective:  Patient ID: Courtney Hale, female    DOB: December 01, 1965  Age: 56 y.o. MRN: [id number]  CC: Chief Complaint  Patient presents with   Follow-up    Blood sugar    HPI:  56 year old female with the below mentioned medical problems presents for follow-up regarding her type 2 diabetes.  Blood sugar control is improving.  She has a Presenter, broadcasting.  I have reviewed the data.  Blood sugars are fairly well controlled and are below 150 particularly in the morning.  Rises after breakfast and peaks around noon.  Then stays elevated throughout the evening until after dinner.  She is currently on Lantus 50 units daily.  She is using NovoLog intermittently depending on the amount of carbs and her sugar prior to her meals.  She remains on prednisone 40 mg daily.  She is eager to get into the [facility] [place] [facility] clinic.  Patient Active Problem List   Diagnosis Date Noted   Acute bronchitis 11/04/2021   Uncontrolled type 2 diabetes mellitus with hyperglycemia ([place]) 10/03/2021   Sarcoidosis 08/14/2021   Lipoma 03/09/2021   GERD (gastroesophageal reflux disease) 07/27/2017   Anxiety and depression 12/18/2016   IBS (irritable bowel syndrome) 03/18/2016   Melanoma of skin ([place]) 03/18/2016   Headache, chronic migraine without aura 01/31/2013   Hypertension 01/31/2013    Social Hx   Social History   Socioeconomic History   Marital status: Single    Spouse name: Not on file   Number of children: Not on file   Years of education: Not on file   Highest education level: Not on file  Occupational History   Not on file  Tobacco Use   Smoking status: Never   Smokeless tobacco: Never  Vaping Use   Vaping Use: Never used  Substance and Sexual Activity   Alcohol use: Yes    Comment: occassionally   Drug use: No   Sexual activity: Not on file  Other Topics Concern   Not on file  Social History Narrative   Not on file   Social Determinants of Health   Financial Resource Strain: Low  Risk    Difficulty of Paying Living Expenses: Not hard at all  Food Insecurity: No Food Insecurity   Worried About Charity fundraiser in the Last Year: Never true   Ran Out of Food in the Last Year: Never true  Transportation Needs: No Transportation Needs   Lack of Transportation (Medical): No   Lack of Transportation (Non-Medical): No  Physical Activity: Inactive   Days of Exercise per Week: 0 days   Minutes of Exercise per Session: 0 min  Stress: Stress Concern Present   Feeling of Stress : Very much  Social Connections: Socially Isolated   Frequency of Communication with Friends and Family: More than three times a week   Frequency of Social Gatherings with Friends and Family: More than three times a week   Attends Religious Services: Never   Marine scientist or Organizations: No   Attends Archivist Meetings: Never   Marital Status: Never married    Review of Systems  Constitutional:  Negative for fever.  Musculoskeletal:  Positive for arthralgias and back pain.    Objective:  BP 110/70    Pulse 90    Temp 98.3 F (36.8 C) (Oral)    Ht 5' 3.5" (1.613 m)    Wt 196 lb 3.2 oz (89 kg)    LMP 10/26/2014    SpO2  98%    BMI 34.21 kg/m   BP/Weight 11/11/2021 11/10/2021 0/06/2329  Systolic BP 076 226 333  Diastolic BP 70 70 79  Wt. (Lbs) 196.2 196.4 194.8  BMI 34.21 34.24 34.51    Physical Exam Constitutional:      General: She is not in acute distress.    Appearance: Normal appearance. She is not ill-appearing.  HENT:     Head: Normocephalic and atraumatic.  Cardiovascular:     Rate and Rhythm: Normal rate and regular rhythm.  Pulmonary:     Effort: Pulmonary effort is normal.     Breath sounds: Normal breath sounds. No wheezing, rhonchi or rales.  Neurological:     Mental Status: She is alert.  Psychiatric:        Mood and Affect: Mood normal.        Behavior: Behavior normal.    Lab Results  Component Value Date   WBC 10.2 10/19/2021   HGB 13.4  10/19/2021   HCT 39.6 10/19/2021   PLT 294 10/19/2021   GLUCOSE 239 (H) 10/19/2021   CHOL 258 (H) 09/25/2021   TRIG 169 (H) 09/25/2021   HDL 59 09/25/2021   LDLCALC 168 (H) 09/25/2021   ALT 36 08/26/2021   AST 24 08/26/2021   NA 140 10/19/2021   K 3.7 10/19/2021   CL 103 10/19/2021   CREATININE 0.63 10/19/2021   BUN 15 10/19/2021   CO2 28 10/19/2021   TSH 0.996 08/27/2020   INR 0.9 07/21/2021   HGBA1C 7.9 (H) 09/25/2021     Assessment & Plan:   Problem List Items Addressed This Visit       Endocrine   Uncontrolled type 2 diabetes mellitus with hyperglycemia ([place])    Glycemic control is improving.  This is quite difficult in the setting of chronic steroid use. Continue Lantus 50 units daily.  Advised to take rapid acting insulin with her evening meal.  May need rapid acting insulin at lunchtime as well. Follow-up in 1 month.      Relevant Medications   insulin glargine (LANTUS) 100 UNIT/ML Solostar Pen    Meds ordered this encounter  Medications   insulin glargine (LANTUS) 100 UNIT/ML Solostar Pen    Sig: Inject 50 Units into the skin daily.    Dispense:  45 mL    Refill:  3    Follow-up:  Return in about 1 month (around 12/12/2021).  [place]

## 2021-11-11 NOTE — Patient Instructions (Signed)
Continue your long acting insulin at the current dose.  Cover the evening meal 5-7 units.  Follow up in 1 month.  Take care  Dr. Lacinda Axon

## 2021-11-11 NOTE — Patient Instructions (Signed)
Courtney Hale  11/11/2021     @PREFPERIOPPHARMACY @   Your procedure is scheduled on  11/16/2021.   Report to [facility] at  1200  P.M.   Call this number if you have problems the morning of surgery:  [phone]   Remember:  Follow the diet and prep instructions given to you by the office.   DO NOT take any medications for diabetes the morning of your procedure.    Bring extra [place] supplies in case your sensor becomes dislodged during the procedure.    Use your inhale before you come abd bring your rescue inhaler with you.    Take these medicines the morning of surgery with A SIP OF WATER            buspar, hydrocodone (if needed), antivert(if needed), prilosec, zofran (if needed), paxil, deltasone, maxalt (if needed).     Do not wear jewelry, make-up or nail polish.  Do not wear lotions, powders, or perfumes, or deodorant.  Do not shave 48 hours prior to surgery.  Men may shave face and neck.  Do not bring valuables to the hospital.  [facility] is not responsible for any belongings or valuables.  Contacts, dentures or bridgework may not be worn into surgery.  Leave your suitcase in the car.  After surgery it may be brought to your room.  For patients admitted to the hospital, discharge time will be determined by your treatment team.  Patients discharged the day of surgery will not be allowed to drive home and must have someone with them for 24 hours.    Special instructions:   DO NOT smoke tobacco or vape for 24 hours before your procedure.  Please read over the following fact sheets that you were given. Anesthesia Post-op Instructions and Care and Recovery After Surgery      Upper Endoscopy, Adult, Care After This sheet gives you information about how to care for yourself after your procedure. Your health care provider may also give you more specific instructions. If you have problems or questions, contact your health care provider. What can I  expect after the procedure? After the procedure, it is common to have: A sore throat. Mild stomach pain or discomfort. Bloating. Nausea. Follow these instructions at home:  Follow instructions from your health care provider about what to eat or drink after your procedure. Return to your normal activities as told by your health care provider. Ask your health care provider what activities are safe for you. Take over-the-counter and prescription medicines only as told by your health care provider. If you were given a sedative during the procedure, it can affect you for several hours. Do not drive or operate machinery until your health care provider says that it is safe. Keep all follow-up visits as told by your health care provider. This is important. Contact a health care provider if you have: A sore throat that lasts longer than one day. Trouble swallowing. Get help right away if: You vomit blood or your vomit looks like coffee grounds. You have: A fever. Bloody, black, or tarry stools. A severe sore throat or you cannot swallow. Difficulty breathing. Severe pain in your chest or abdomen. Summary After the procedure, it is common to have a sore throat, mild stomach discomfort, bloating, and nausea. If you were given a sedative during the procedure, it can affect you for several hours. Do not drive or operate machinery until your health care  provider says that it is safe. Follow instructions from your health care provider about what to eat or drink after your procedure. Return to your normal activities as told by your health care provider. This information is not intended to replace advice given to you by your health care provider. Make sure you discuss any questions you have with your health care provider. Document Revised: 08/17/2019 Document Reviewed: 03/13/2018 Elsevier Patient Education  2022 [place]. Colonoscopy, Adult, Care After This sheet gives you information about how  to care for yourself after your procedure. Your health care provider may also give you more specific instructions. If you have problems or questions, contact your health care provider. What can I expect after the procedure? After the procedure, it is common to have: A small amount of blood in your stool for 24 hours after the procedure. Some gas. Mild cramping or bloating of your abdomen. Follow these instructions at home: Eating and drinking  Drink enough fluid to keep your urine pale yellow. Follow instructions from your health care provider about eating or drinking restrictions. Resume your normal diet as instructed by your health care provider. Avoid heavy or fried foods that are hard to digest. Activity Rest as told by your health care provider. Avoid sitting for a long time without moving. Get up to take short walks every 1-2 hours. This is important to improve blood flow and breathing. Ask for help if you feel weak or unsteady. Return to your normal activities as told by your health care provider. Ask your health care provider what activities are safe for you. Managing cramping and bloating  Try walking around when you have cramps or feel bloated. Apply heat to your abdomen as told by your health care provider. Use the heat source that your health care provider recommends, such as a moist heat pack or a heating pad. Place a towel between your skin and the heat source. Leave the heat on for 20-30 minutes. Remove the heat if your skin turns bright red. This is especially important if you are unable to feel pain, heat, or cold. You may have a greater risk of getting burned. General instructions If you were given a sedative during the procedure, it can affect you for several hours. Do not drive or operate machinery until your health care provider says that it is safe. For the first 24 hours after the procedure: Do not sign important documents. Do not drink alcohol. Do your regular  daily activities at a slower pace than normal. Eat soft foods that are easy to digest. Take over-the-counter and prescription medicines only as told by your health care provider. Keep all follow-up visits as told by your health care provider. This is important. Contact a health care provider if: You have blood in your stool 2-3 days after the procedure. Get help right away if you have: More than a small spotting of blood in your stool. Large blood clots in your stool. Swelling of your abdomen. Nausea or vomiting. A fever. Increasing pain in your abdomen that is not relieved with medicine. Summary After the procedure, it is common to have a small amount of blood in your stool. You may also have mild cramping and bloating of your abdomen. If you were given a sedative during the procedure, it can affect you for several hours. Do not drive or operate machinery until your health care provider says that it is safe. Get help right away if you have a lot of blood in your  stool, nausea or vomiting, a fever, or increased pain in your abdomen. This information is not intended to replace advice given to you by your health care provider. Make sure you discuss any questions you have with your health care provider. Document Revised: 08/17/2019 Document Reviewed: 05/07/2019 Elsevier Patient Education  [place] After This sheet gives you information about how to care for yourself after your procedure. Your health care provider may also give you more specific instructions. If you have problems or questions, contact your health care provider. What can I expect after the procedure? After the procedure, it is common to have: Tiredness. Forgetfulness about what happened after the procedure. Impaired judgment for important decisions. Nausea or vomiting. Some difficulty with balance. Follow these instructions at home: For the time period you were told by your health  care provider:   Rest as needed. Do not participate in activities where you could fall or become injured. Do not drive or use machinery. Do not drink alcohol. Do not take sleeping pills or medicines that cause drowsiness. Do not make important decisions or sign legal documents. Do not take care of children on your own. Eating and drinking Follow the diet that is recommended by your health care provider. Drink enough fluid to keep your urine pale yellow. If you vomit: Drink water, juice, or soup when you can drink without vomiting. Make sure you have little or no nausea before eating solid foods. General instructions Have a responsible adult stay with you for the time you are told. It is important to have someone help care for you until you are awake and alert. Take over-the-counter and prescription medicines only as told by your health care provider. If you have sleep apnea, surgery and certain medicines can increase your risk for breathing problems. Follow instructions from your health care provider about wearing your sleep device: Anytime you are sleeping, including during daytime naps. While taking prescription pain medicines, sleeping medicines, or medicines that make you drowsy. Avoid smoking. Keep all follow-up visits as told by your health care provider. This is important. Contact a health care provider if: You keep feeling nauseous or you keep vomiting. You feel light-headed. You are still sleepy or having trouble with balance after 24 hours. You develop a rash. You have a fever. You have redness or swelling around the IV site. Get help right away if: You have trouble breathing. You have new-onset confusion at home. Summary For several hours after your procedure, you may feel tired. You may also be forgetful and have poor judgment. Have a responsible adult stay with you for the time you are told. It is important to have someone help care for you until you are awake and  alert. Rest as told. Do not drive or operate machinery. Do not drink alcohol or take sleeping pills. Get help right away if you have trouble breathing, or if you suddenly become confused. This information is not intended to replace advice given to you by your health care provider. Make sure you discuss any questions you have with your health care provider. Document Revised: 06/26/2020 Document Reviewed: 09/13/2019 Elsevier Patient Education  2022 [employer].

## 2021-11-11 NOTE — Progress Notes (Signed)
Endocrinology Consult Note       11/11/2021, 4:51 PM   Subjective:    Patient ID: Courtney Hale, female    DOB: [birth year].  Courtney Hale is being seen in consultation for management of currently uncontrolled symptomatic diabetes requested by  Coral Spikes, DO.   Past Medical History:  Diagnosis Date   Complication of anesthesia    woke up during endoscopy and colonoscopy   Depression    Hemorrhoids    History of esophageal stricture    2011  peptic w/  dilatation   History of gastritis    2011   History of melanoma excision    2015  left leg/   12-09-2015 back    Hypertension    Microhematuria    Migraines    Nephrolithiasis    left side non-obstructive    Right ureteral stone    Sarcoidosis 07/24/2021   Urgency of urination    Wears contact lenses     Past Surgical History:  Procedure Laterality Date   ABDOMINAL HYSTERECTOMY     CARPAL TUNNEL RELEASE Right 01-05- 2015   COLONOSCOPY N/A 08/27/2015   Procedure: COLONOSCOPY;  Surgeon: Danie Binder, MD;  Location: [facility];  Service: Endoscopy;  Laterality: N/A;  0830   CYST EXCISION  2012     right hand   CYSTOSCOPY W/ URETERAL STENT PLACEMENT Right 12/22/2015   Procedure: CYSTOSCOPY WITH RETROGRADE PYELOGRAM/URETERAL STENT PLACEMENT;  Surgeon: Nickie Retort, MD;  Location: [facility];  Service: Urology;  Laterality: Right;   CYSTOSCOPY WITH RETROGRADE PYELOGRAM, URETEROSCOPY AND STENT PLACEMENT Right 01/05/2016   Procedure: CYSTOSCOPY WITH RETROGRADE PYELOGRAM, URETEROSCOPY AND STENT REPLACEMENT;  Surgeon: Nickie Retort, MD;  Location: [facility];  Service: Urology;  Laterality: Right;   DILATION AND CURETTAGE OF UTERUS  03-12-2009   w/  Suction   double balloon enteroscopy     Dr. Arsenio Loader at [facility]: no erosions, no evidence of Crohn's disease, path without Crohn's.    EGD with push enteroscopy  02-12-2010     patent distal peptic stricture with diffuse antral erythema, normal D1 and D2    LAPAROSCOPIC ASSISTED VAGINAL HYSTERECTOMY  10-26-2014   w/  Bilateral Salpingoophorectomy   STONE EXTRACTION WITH BASKET Right 01/05/2016   Procedure: STONE EXTRACTION WITH BASKET;  Surgeon: Nickie Retort, MD;  Location: [facility];  Service: Urology;  Laterality: Right;   TONSILLECTOMY  1998  approx    Social History   Socioeconomic History   Marital status: Single    Spouse name: Not on file   Number of children: Not on file   Years of education: Not on file   Highest education level: Not on file  Occupational History   Not on file  Tobacco Use   Smoking status: Never   Smokeless tobacco: Never  Vaping Use   Vaping Use: Never used  Substance and Sexual Activity   Alcohol use: Yes    Comment: occassionally   Drug use: No   Sexual activity: Not on file  Other Topics Concern   Not on file  Social History Narrative   Not on file   Social Determinants of Health   Financial Resource Strain: Low Risk    Difficulty of Paying Living Expenses: Not hard at all  Food Insecurity: No Food Insecurity   Worried About Charity fundraiser in the Last Year: Never true   [place] in the Last Year: Never true  Transportation Needs: No Transportation Needs   Lack of Transportation (Medical): No   Lack of Transportation (Non-Medical): No  Physical Activity: Inactive   Days of Exercise per Week: 0 days   Minutes of Exercise per Session: 0 min  Stress: Stress Concern Present   Feeling of Stress : Very much  Social Connections: Socially Isolated   Frequency of Communication with Friends and Family: More than three times a week   Frequency of Social Gatherings with Friends and Family: More than three times a week   Attends Religious Services: Never   Marine scientist or Organizations: No   Attends Music therapist: Never   Marital Status: Never married     Family History  Problem Relation Age of Onset   Hypertension Mother    Thyroid disease Mother    Diabetes Mother    Hyperlipidemia Mother    Stroke Mother    Hyperlipidemia Father    Hypertension Father    Heart attack Father    Heart failure Father    Colon cancer Neg Hx     Outpatient Encounter Medications as of 11/11/2021  Medication Sig   acetaminophen (TYLENOL) 500 MG tablet Take 1,000 mg by mouth every 6 (six) hours as needed for moderate pain.   albuterol (VENTOLIN HFA) 108 (90 Base) MCG/ACT inhaler Inhale 2 puffs into the lungs every 6 (six) hours as needed for wheezing or shortness of breath.   busPIRone (BUSPAR) 5 MG tablet TAKE 1 TABLET BY MOUTH THREE TIMES DAILY (Patient taking differently: Take 5 mg by mouth 3 (three) times daily as needed (anxiety).)   calcium carbonate (TUMS - DOSED IN MG ELEMENTAL CALCIUM) 500 MG chewable tablet Chew 1,500 mg by mouth 2 (two) times daily as needed for indigestion or heartburn.   Carboxymethylcellul-Glycerin (LUBRICATING EYE DROPS OP) Place 1 drop into both eyes daily as needed (dry eyes).   Chromium 1000 MCG TABS Take 1,000 mcg by mouth daily.   CINNAMON PO Take 2,000 mg by mouth daily.   CLENPIQ 10-3.5-12 MG-GM -GM/160ML SOLN SMARTSIG:1 Kit(s) By Mouth Once   Continuous Blood Gluc Receiver (FREESTYLE LIBRE 2 READER) DEVI Use as directed to check blood sugars.   Continuous Blood Gluc Sensor (FREESTYLE LIBRE 2 SENSOR) MISC Use sensor as directed. Change every 10-14 days.   Cyanocobalamin (B-12) 2000 MCG TABS Take 2,000 mcg by mouth daily.   doxycycline (VIBRA-TABS) 100 MG tablet Take 1 tablet (100 mg total) by mouth 2 (two) times daily.   EPINEPHrine 0.3 mg/0.3 mL IJ SOAJ injection Inject 0.3 mg into the muscle as needed for anaphylaxis.   hydrochlorothiazide (HYDRODIURIL) 25 MG tablet Take 1 tablet (25 mg total) by mouth daily.   HYDROcodone-acetaminophen (NORCO/VICODIN) 5-325 MG tablet Take 1 tablet by mouth every 8 (eight) hours  as needed for moderate pain.   Insulin Pen Needle 32G X 4 MM MISC Use as directed to administer insulin.   losartan (COZAAR) 100 MG tablet TAKE 1 TABLET BY MOUTH ONCE DAILY.   meclizine (ANTIVERT) 25 MG tablet Take 25 mg  by mouth 3 (three) times daily as needed for dizziness.   metFORMIN (GLUCOPHAGE) 850 MG tablet Take 1 tablet (850 mg total) by mouth 2 (two) times daily with a meal.   omeprazole (PRILOSEC) 40 MG capsule Take 1 capsule (40 mg total) by mouth 2 (two) times daily before a meal.   ondansetron (ZOFRAN-ODT) 4 MG disintegrating tablet Take 4 mg by mouth every 8 (eight) hours as needed for nausea or vomiting.   PARoxetine (PAXIL) 40 MG tablet Take 1 tablet (40 mg total) by mouth every morning.   predniSONE (DELTASONE) 10 MG tablet Take 4 tablets (40 mg total) by mouth daily with breakfast.   Probiotic Product (PROBIOTIC PO) Take 1 capsule by mouth daily.   prochlorperazine (COMPAZINE) 10 MG tablet Take 1 tablet (10 mg total) by mouth every 6 (six) hours as needed for nausea or vomiting.   rizatriptan (MAXALT-MLT) 10 MG disintegrating tablet Take 1 tablet (10 mg total) by mouth as needed for migraine. May repeat in 2 hours if needed. Max 2 per 24 hours   traZODone (DESYREL) 100 MG tablet Take 1 tablet (100 mg total) by mouth at bedtime as needed for sleep.   valACYclovir (VALTREX) 500 MG tablet Take 500 mg by mouth 2 (two) times daily as needed (outbreaks).   [DISCONTINUED] insulin aspart (NOVOLOG FLEXPEN) 100 UNIT/ML FlexPen 6 units with largest meal of the day. 5 units with breakfast and 5 units with supper (Patient taking differently: Inject 5 Units into the skin 2 (two) times daily with a meal.)   [DISCONTINUED] insulin glargine (LANTUS) 100 UNIT/ML Solostar Pen Inject 50 Units into the skin daily.   insulin aspart (NOVOLOG FLEXPEN) 100 UNIT/ML FlexPen Inject 8-14 Units into the skin 3 (three) times daily with meals. 6 units with largest meal of the day. 5 units with breakfast and 5  units with supper   insulin glargine (LANTUS) 100 UNIT/ML Solostar Pen Inject 35 Units into the skin at bedtime.   [DISCONTINUED] benzonatate (TESSALON) 200 MG capsule Take 1 capsule (200 mg total) by mouth 3 (three) times daily as needed for cough.   [DISCONTINUED] cetirizine (ZYRTEC) 10 MG chewable tablet Chew 10 mg by mouth daily. (Patient not taking: Reported on 11/11/2021)   [DISCONTINUED] Cholecalciferol (VITAMIN D) 50 MCG (2000 UT) tablet Take 2,000 Units by mouth daily.   [DISCONTINUED] ibuprofen (ADVIL) 200 MG tablet Take 600-800 mg by mouth every 6 (six) hours as needed for moderate pain or headache.   [DISCONTINUED] insulin glargine (LANTUS) 100 UNIT/ML Solostar Pen Inject 50 Units into the skin daily.   [DISCONTINUED] meloxicam (MOBIC) 7.5 MG tablet Take 7.5 mg by mouth daily as needed (migraine).   [DISCONTINUED] traZODone (DESYREL) 50 MG tablet Take 50 mg by mouth at bedtime.   No facility-administered encounter medications on file as of 11/11/2021.    ALLERGIES: Allergies  Allergen Reactions   Topamax [Topiramate] Other (See Comments)    Developed kidney stones   Fluorouracil Swelling    Burning skin   Sulfa Antibiotics Other (See Comments)    Burning sensation, flushing to skin    VACCINATION STATUS: Immunization History  Administered Date(s) Administered   Influenza,inj,Quad PF,6+ Mos 09/09/2016, 08/29/2017, 09/27/2019, 10/30/2020, 08/14/2021   Influenza-Unspecified 08/22/2013, 07/22/2014, 08/08/2015   MMR 01/22/2011   PFIZER(Purple Top)SARS-COV-2 Vaccination 12/30/2019, 01/20/2020   Tdap 01/22/2011, 04/17/2014    Diabetes She presents for her initial diabetic visit. She has type 2 diabetes mellitus. Onset time: diagnosed at approx age of 37. There are no hypoglycemic  associated symptoms. There are no diabetic associated symptoms. There are no hypoglycemic complications. There are no diabetic complications. Risk factors for coronary artery disease include diabetes  mellitus, dyslipidemia, family history, hypertension and obesity. Current diabetic treatment includes intensive insulin program and oral agent (monotherapy). She is compliant with treatment most of the time. Her weight is fluctuating minimally. She is following a generally unhealthy diet. When asked about meal planning, she reported none. She has not had a previous visit with a dietitian. She participates in exercise intermittently. Her home blood glucose trend is fluctuating dramatically. Her overall blood glucose range is 180-200 mg/dl. (She presents today for her consultation with her CGM, no logs, showing near target fasting and significantly high postprandial hyperglycemia.  Her most recent A1c was 7.9% on 12/2.  She is on chronic steroids for sarcoidosis, but has plans to switch to a different medication in the future.  Analysis of her CGM shows TIR 49%, TAR 51%, TBR 0%.  She drinks mostly diet soda throughout the day and does not eat routine meals.  She typically will eat a protein bar at breakfast, skip lunch and eat supper, only snacking occasionally.  She does not engage in routine physical activity due to the pain associated with her sarcoidosis.  She is UTD on eye exam and has not seen podiatrist in the past.) An ACE inhibitor/angiotensin II receptor blocker is being taken. She does not see a podiatrist.Eye exam is current.  Hyperlipidemia This is a chronic problem. The current episode started more than 1 year ago. The problem is uncontrolled. Recent lipid tests were reviewed and are high. Exacerbating diseases include diabetes and obesity. Factors aggravating her hyperlipidemia include thiazides and fatty foods. She is currently on no antihyperlipidemic treatment. Compliance problems include adherence to diet and adherence to exercise.  Risk factors for coronary artery disease include diabetes mellitus, dyslipidemia, family history, obesity and hypertension.  Hypertension This is a chronic problem.  The current episode started more than 1 year ago. The problem has been resolved since onset. The problem is controlled. Agents associated with hypertension include steroids. Risk factors for coronary artery disease include diabetes mellitus, dyslipidemia, family history and obesity. Past treatments include diuretics and angiotensin blockers. The current treatment provides significant improvement. There are no compliance problems.     Review of systems  Constitutional: + Minimally fluctuating body weight, current Body mass index is 35.13 kg/m., no fatigue, no subjective hyperthermia, no subjective hypothermia Eyes: no blurry vision, no xerophthalmia ENT: no sore throat, no nodules palpated in throat, no dysphagia/odynophagia, no hoarseness Cardiovascular: no chest pain, no shortness of breath, no palpitations, no leg swelling Respiratory: no cough, no shortness of breath Gastrointestinal: no nausea/vomiting/diarrhea Musculoskeletal: generalized aches and pains- says r/t sarcoidosis Skin: no rashes, no hyperemia Neurological: no tremors, no numbness, no tingling, no dizziness Psychiatric: no depression, no anxiety  Objective:     BP 112/67    Pulse 89    Ht 5' 2.5" (1.588 m)    Wt 195 lb 3.2 oz (88.5 kg)    LMP 10/26/2014    SpO2 96%    BMI 35.13 kg/m   Wt Readings from Last 3 Encounters:  11/11/21 195 lb 3.2 oz (88.5 kg)  11/11/21 196 lb 3.2 oz (89 kg)  11/10/21 196 lb 6.4 oz (89.1 kg)     BP Readings from Last 3 Encounters:  11/11/21 112/67  11/11/21 110/70  11/10/21 116/70     Physical Exam- Limited  Constitutional:  Body mass index  is 35.13 kg/m. , not in acute distress, normal state of mind Eyes:  EOMI, no exophthalmos Neck: Supple Cardiovascular: RRR, no murmurs, rubs, or gallops, no edema Respiratory: Adequate breathing efforts, no crackles, rales, rhonchi, or wheezing Musculoskeletal: no gross deformities, strength intact in all four extremities, no gross restriction  of joint movements Skin:  no rashes, no hyperemia Neurological: no tremor with outstretched hands    CMP ( most recent) CMP     Component Value Date/Time   NA 140 10/19/2021 2336   NA 143 01/26/2021 1429   K 3.7 10/19/2021 2336   CL 103 10/19/2021 2336   CO2 28 10/19/2021 2336   GLUCOSE 239 (H) 10/19/2021 2336   BUN 15 10/19/2021 2336   BUN 11 01/26/2021 1429   CREATININE 0.63 10/19/2021 2336   CALCIUM 9.1 10/19/2021 2336   PROT 6.8 08/26/2021 1253   PROT 7.1 01/26/2021 1429   ALBUMIN 3.8 08/26/2021 1253   ALBUMIN 4.6 01/26/2021 1429   AST 24 08/26/2021 1253   ALT 36 08/26/2021 1253   ALKPHOS 76 08/26/2021 1253   BILITOT 0.4 08/26/2021 1253   BILITOT 0.3 01/26/2021 1429   GFRNONAA >60 10/19/2021 2336   GFRAA 121 08/27/2020 1517     Diabetic Labs (most recent): Lab Results  Component Value Date   HGBA1C 7.9 (H) 09/25/2021   HGBA1C 6.6 (H) 01/26/2021   HGBA1C 9.0 (H) 08/27/2020     Lipid Panel ( most recent) Lipid Panel     Component Value Date/Time   CHOL 258 (H) 09/25/2021 1134   TRIG 169 (H) 09/25/2021 1134   HDL 59 09/25/2021 1134   CHOLHDL 4.4 09/25/2021 1134   LDLCALC 168 (H) 09/25/2021 1134   LABVLDL 31 09/25/2021 1134      Lab Results  Component Value Date   TSH 0.996 08/27/2020   TSH 1.220 06/09/2016   TSH 0.785 08/13/2015           Assessment & Plan:   1) Uncontrolled type 2 diabetes mellitus with hyperglycemia ([place])  She presents today for her consultation with her CGM, no logs, showing near target fasting and significantly high postprandial hyperglycemia.  Her most recent A1c was 7.9% on 12/2.  She is on chronic steroids for sarcoidosis, but has plans to switch to a different medication in the future.  Analysis of her CGM shows TIR 49%, TAR 51%, TBR 0%.  She drinks mostly diet soda throughout the day and does not eat routine meals.  She typically will eat a protein bar at breakfast, skip lunch and eat supper, only snacking occasionally.   She does not engage in routine physical activity due to the pain associated with her sarcoidosis.  She is UTD on eye exam and has not seen podiatrist in the past.  - Courtney Hale has currently uncontrolled symptomatic type 2 DM since 56 years of age, with most recent A1c of 7.9 %.   -Recent labs reviewed.  - I had a long discussion with her about the progressive nature of diabetes and the pathology behind its complications. -her diabetes is complicated by chronic steroid use due to sarcoidosis and she remains at a high risk for more acute and chronic complications which include CAD, CVA, CKD, retinopathy, and neuropathy. These are all discussed in detail with her.  The following Lifestyle Medicine recommendations according to [facility]) were discussed and offered to patient and she agrees to start the journey:  A. Whole Foods, Plant-based plate comprising  of fruits and vegetables, plant-based proteins, whole-grain carbohydrates was discussed in detail with the patient.   A list for source of those nutrients were also provided to the patient.  Patient will use only water or unsweetened tea for hydration. B.  The need to stay away from risky substances including alcohol, smoking; obtaining 7 to 9 hours of restorative sleep, at least 150 minutes of moderate intensity exercise weekly, the importance of healthy social connections,  and stress reduction techniques were discussed. C.  A full color page of  Calorie density of various food groups per pound showing examples of each food groups was provided to the patient.  - I have counseled her on diet and weight management by adopting a carbohydrate restricted/protein rich diet. Patient is encouraged to switch to unprocessed or minimally processed complex starch and increased protein intake (animal or plant source), fruits, and vegetables. -  she is advised to stick to a routine mealtimes to eat 3 meals a day and avoid  unnecessary snacks (to snack only to correct hypoglycemia).   - she acknowledges that there is a room for improvement in her food and drink choices. - Suggestion is made for her to avoid simple carbohydrates from her diet including Cakes, Sweet Desserts, Ice Cream, Soda (diet and regular), Sweet Tea, Candies, Chips, Cookies, Store Bought Juices, Alcohol in Excess of 1-2 drinks a day, Artificial Sweeteners, Coffee Creamer, and "Sugar-free" Products. This will help patient to have more stable blood glucose profile and potentially avoid unintended weight gain.  - she will be scheduled with Jearld Fenton, RDN, CDE for diabetes education.  - I have approached her with the following individualized plan to manage her diabetes and patient agrees:   -She is advised to lower her dose of Lantus to 35 units SQ nightly (had been taking 50 units in the morning).  I did adjust her Novolog slightly to 8-14 units SQ TID with meals if glucose is above 90 and she is eating (Specific instructions on how to titrate insulin dosage based on glucose readings given to patient in writing).  This will hopefully help her postprandial hyperglycemia likely associated with her steroid use.  -she is encouraged to continue monitoring glucose 4 times daily (using her CGM), before meals and before bed, to log their readings on the clinic sheets provided, and bring them to review at follow up appointment in 2 weeks.  - she is warned not to take insulin without proper monitoring per orders. - Adjustment parameters are given to her for hypo and hyperglycemia in writing. - she is encouraged to call clinic for blood glucose levels less than 70 or above 300 mg /dl. - she is advised to continue Metformin 850 mg po twice daily with meals, therapeutically suitable for patient .  - she will be considered for incretin therapy as appropriate next visit. - She is allergic to sulfa meds.  - Specific targets for  A1c; LDL, HDL, and  Triglycerides were discussed with the patient.  2) Blood Pressure /Hypertension:  her blood pressure is controlled to target.   she is advised to continue her current medications including Losartan 100 mg p.o. daily with breakfast and HCTZ 25 mg po daily.  3) Lipids/Hyperlipidemia:    Review of her recent lipid panel from 09/25/21 showed uncontrolled LDL at 168 and slightly elevated triglycerides of 169.  She is not currently on any lipid lowering medications.   4)  Weight/Diet:  her Body mass index is 35.13 kg/m.  -  clearly complicating her diabetes care.   she is a candidate for weight loss. I discussed with her the fact that loss of 5 - 10% of her  current body weight will have the most impact on her diabetes management.  Exercise, and detailed carbohydrates information provided  -  detailed on discharge instructions.  5) Chronic Care/Health Maintenance: -she is on ACEI/ARB and not on Statin medications and is encouraged to initiate and continue to follow up with Ophthalmology, Dentist, Podiatrist at least yearly or according to recommendations, and advised to stay away from smoking. I have recommended yearly flu vaccine and pneumonia vaccine at least every 5 years; moderate intensity exercise for up to 150 minutes weekly; and sleep for at least 7 hours a day.  - she is advised to maintain close follow up with Coral Spikes, DO for primary care needs, as well as her other providers for optimal and coordinated care.   - Time spent in this patient care: 60 min, of which > 50% was spent in counseling her about her diabetes and the rest reviewing her blood glucose logs, discussing her hypoglycemia and hyperglycemia episodes, reviewing her current and previous labs/studies (including abstraction from other facilities) and medications doses and developing a long term treatment plan based on the latest standards of care/guidelines; and documenting her care.    Please refer to Patient Instructions for  Blood Glucose Monitoring and Insulin/Medications Dosing Guide" in media tab for additional information. Please also refer to "Patient Self Inventory" in the Media tab for reviewed elements of pertinent patient history.  Benard Rink participated in the discussions, expressed understanding, and voiced agreement with the above plans.  All questions were answered to her satisfaction. she is encouraged to contact clinic should she have any questions or concerns prior to her return visit.     Follow up plan: - Return in about 2 weeks (around 11/25/2021) for Diabetes F/U, Bring meter and logs.    Rayetta Pigg, [facility] [address] [place], [place] [address] Phone: [phone] Fax: [phone]  11/11/2021, 4:51 PM

## 2021-11-11 NOTE — Assessment & Plan Note (Signed)
Glycemic control is improving.  This is quite difficult in the setting of chronic steroid use. Continue Lantus 50 units daily.  Advised to take rapid acting insulin with her evening meal.  May need rapid acting insulin at lunchtime as well. Follow-up in 1 month.

## 2021-11-12 ENCOUNTER — Encounter (HOSPITAL_COMMUNITY)
Admission: RE | Admit: 2021-11-12 | Discharge: 2021-11-12 | Disposition: A | Discharge status: Home / Self Care | Payer: Medicaid Other | Source: Ambulatory Visit | Attending: Internal Medicine | Admitting: Internal Medicine

## 2021-11-12 ENCOUNTER — Other Ambulatory Visit: Payer: Self-pay

## 2021-11-12 MED ORDER — INSULIN GLARGINE 100 UNIT/ML SOLOSTAR PEN: 35.0000 [IU] | PEN_INJECTOR | Freq: Every day | SUBCUTANEOUS | 0 refills | Status: DC

## 2021-11-12 NOTE — Addendum Note (Signed)
Addended by: Dairl Ponder on: 11/12/2021 02:46 PM   Modules accepted: Orders

## 2021-11-16 ENCOUNTER — Other Ambulatory Visit: Payer: Self-pay

## 2021-11-16 ENCOUNTER — Ambulatory Visit (HOSPITAL_COMMUNITY): Payer: Medicaid Other | Admitting: Anesthesiology

## 2021-11-16 ENCOUNTER — Encounter (HOSPITAL_COMMUNITY)
Admission: RE | Disposition: A | Discharge status: Home / Self Care | Payer: Self-pay | Source: Home / Self Care | Attending: Internal Medicine

## 2021-11-16 ENCOUNTER — Encounter (HOSPITAL_COMMUNITY): Payer: Self-pay

## 2021-11-16 ENCOUNTER — Ambulatory Visit (HOSPITAL_COMMUNITY)
Admission: RE | Admit: 2021-11-16 | Discharge: 2021-11-16 | Disposition: A | Discharge status: Home / Self Care | Payer: Medicaid Other | Attending: Internal Medicine | Admitting: Internal Medicine

## 2021-11-16 ENCOUNTER — Encounter: Payer: Self-pay | Admitting: Pulmonary Disease

## 2021-11-16 DIAGNOSIS — K2970 Gastritis, unspecified, without bleeding: Secondary | ICD-10-CM | POA: Diagnosis not present

## 2021-11-16 DIAGNOSIS — Z7984 Long term (current) use of oral hypoglycemic drugs: Secondary | ICD-10-CM | POA: Diagnosis not present

## 2021-11-16 DIAGNOSIS — R1013 Epigastric pain: Secondary | ICD-10-CM | POA: Diagnosis not present

## 2021-11-16 DIAGNOSIS — Z79899 Other long term (current) drug therapy: Secondary | ICD-10-CM | POA: Insufficient documentation

## 2021-11-16 DIAGNOSIS — I10 Essential (primary) hypertension: Secondary | ICD-10-CM | POA: Insufficient documentation

## 2021-11-16 DIAGNOSIS — E119 Type 2 diabetes mellitus without complications: Secondary | ICD-10-CM | POA: Insufficient documentation

## 2021-11-16 DIAGNOSIS — D849 Immunodeficiency, unspecified: Secondary | ICD-10-CM | POA: Insufficient documentation

## 2021-11-16 DIAGNOSIS — K319 Disease of stomach and duodenum, unspecified: Secondary | ICD-10-CM | POA: Diagnosis not present

## 2021-11-16 DIAGNOSIS — Z794 Long term (current) use of insulin: Secondary | ICD-10-CM | POA: Insufficient documentation

## 2021-11-16 DIAGNOSIS — K625 Hemorrhage of anus and rectum: Secondary | ICD-10-CM | POA: Insufficient documentation

## 2021-11-16 DIAGNOSIS — R1032 Left lower quadrant pain: Secondary | ICD-10-CM | POA: Diagnosis not present

## 2021-11-16 DIAGNOSIS — Z7952 Long term (current) use of systemic steroids: Secondary | ICD-10-CM | POA: Diagnosis not present

## 2021-11-16 DIAGNOSIS — D8689 Sarcoidosis of other sites: Secondary | ICD-10-CM | POA: Insufficient documentation

## 2021-11-16 DIAGNOSIS — K219 Gastro-esophageal reflux disease without esophagitis: Secondary | ICD-10-CM | POA: Diagnosis not present

## 2021-11-16 DIAGNOSIS — K589 Irritable bowel syndrome without diarrhea: Secondary | ICD-10-CM | POA: Insufficient documentation

## 2021-11-16 DIAGNOSIS — Z791 Long term (current) use of non-steroidal anti-inflammatories (NSAID): Secondary | ICD-10-CM | POA: Insufficient documentation

## 2021-11-16 DIAGNOSIS — R12 Heartburn: Secondary | ICD-10-CM | POA: Diagnosis not present

## 2021-11-16 DIAGNOSIS — F32A Depression, unspecified: Secondary | ICD-10-CM | POA: Insufficient documentation

## 2021-11-16 DIAGNOSIS — F419 Anxiety disorder, unspecified: Secondary | ICD-10-CM | POA: Diagnosis not present

## 2021-11-16 DIAGNOSIS — K648 Other hemorrhoids: Secondary | ICD-10-CM | POA: Insufficient documentation

## 2021-11-16 DIAGNOSIS — K2950 Unspecified chronic gastritis without bleeding: Secondary | ICD-10-CM | POA: Diagnosis not present

## 2021-11-16 DIAGNOSIS — D860 Sarcoidosis of lung: Secondary | ICD-10-CM | POA: Diagnosis not present

## 2021-11-16 HISTORY — PX: BIOPSY: SHX5522

## 2021-11-16 HISTORY — PX: COLONOSCOPY WITH PROPOFOL: SHX5780

## 2021-11-16 HISTORY — PX: ESOPHAGOGASTRODUODENOSCOPY (EGD) WITH PROPOFOL: SHX5813

## 2021-11-16 LAB — GLUCOSE, CAPILLARY: Glucose-Capillary: 115 mg/dL — ABNORMAL HIGH (ref 70–99)

## 2021-11-16 SURGERY — COLONOSCOPY WITH PROPOFOL
Anesthesia: General

## 2021-11-16 MED ORDER — PROPOFOL 10 MG/ML IV BOLUS
INTRAVENOUS | Status: DC | PRN
  Administered 2021-11-16 (×2): 60 mg via INTRAVENOUS
  Administered 2021-11-16: 100 mg via INTRAVENOUS
  Administered 2021-11-16 (×2): 40 mg via INTRAVENOUS
  Administered 2021-11-16: 100 mg via INTRAVENOUS

## 2021-11-16 MED ORDER — LACTATED RINGERS IV SOLN: INTRAVENOUS | Status: DC

## 2021-11-16 NOTE — Transfer of Care (Signed)
Immediate Anesthesia Transfer of Care Note  Patient: Courtney Hale  Procedure(s) Performed: COLONOSCOPY WITH PROPOFOL ESOPHAGOGASTRODUODENOSCOPY (EGD) WITH PROPOFOL BIOPSY  Patient Location: PACU  Anesthesia Type:General  Level of Consciousness: awake, alert  and oriented  Airway & Oxygen Therapy: Patient Spontanous Breathing  Post-op Assessment: Report given to RN and Post -op Vital signs reviewed and stable  Post vital signs: Reviewed and stable  Last Vitals:  Vitals Value Taken Time  BP 99/71 11/16/21 1437  Temp    Pulse 77 11/16/21 1437  Resp 15 11/16/21 1437  SpO2 97 % 11/16/21 1437    Last Pain:  Vitals:   11/16/21 1437  TempSrc: Oral  PainSc: 9          Complications: No notable events documented.

## 2021-11-16 NOTE — Op Note (Signed)
[facility] Patient Name: Courtney Hale Procedure Date: 11/16/2021 2:03 PM MRN: [id number] Date of Birth: [birth year] Attending MD: Elon Alas. Abbey Chatters DO CSN: [id number] Age: 56 Admit Type: Outpatient Procedure:                Upper GI endoscopy Indications:              Epigastric abdominal pain, Heartburn Providers:                Elon Alas. Abbey Chatters, DO, Jessica Boudreaux, Hughie Closs, RN, Minette Headland L. Risa Grill, Technician Referring MD:              Medicines:                See the Anesthesia note for documentation of the                            administered medications Complications:            No immediate complications. Estimated Blood Loss:     Estimated blood loss was minimal. Procedure:                Pre-Anesthesia Assessment:                           - The anesthesia plan was to use monitored                            anesthesia care (MAC).                           After obtaining informed consent, the endoscope was                            passed under direct vision. Throughout the                            procedure, the patient's blood pressure, pulse, and                            oxygen saturations were monitored continuously. The                            [id number]-[id number] ([id number]) scope was introduced through the                            mouth, and advanced to the second part of duodenum.                            The upper GI endoscopy was accomplished without                            difficulty. The patient tolerated the procedure  well. Scope In: 2:11:46 PM Scope Out: 2:15:16 PM Total Procedure Duration: 0 hours 3 minutes 30 seconds  Findings:      There is no endoscopic evidence of bleeding, areas of erosion,       esophagitis, hiatal hernia, ulcerations or varices in the entire       esophagus.      Localized mild inflammation characterized by erythema was found in the       gastric body and  in the gastric antrum. Biopsies were taken with a cold       forceps for Helicobacter pylori testing.      The duodenal bulb, first portion of the duodenum and second portion of       the duodenum were normal. Impression:               - Gastritis. Biopsied.                           - Normal duodenal bulb, first portion of the                            duodenum and second portion of the duodenum. Moderate Sedation:      Per Anesthesia Care Recommendation:           - Patient has a contact number available for                            emergencies. The signs and symptoms of potential                            delayed complications were discussed with the                            patient. Return to normal activities tomorrow.                            Written discharge instructions were provided to the                            patient.                           - Resume previous diet.                           - Continue present medications.                           - Await pathology results.                           - Return to GI clinic in 4 months.                           - Use a proton pump inhibitor PO BID. Procedure Code(s):        --- Professional ---  16109, Esophagogastroduodenoscopy, flexible,                            transoral; with biopsy, single or multiple Diagnosis Code(s):        --- Professional ---                           K29.70, Gastritis, unspecified, without bleeding                           R10.13, Epigastric pain                           R12, Heartburn CPT copyright 2019 American Medical Association. All rights reserved. The codes documented in this report are preliminary and upon coder review may  be revised to meet current compliance requirements. Elon Alas. Abbey Chatters, DO [place] Abbey Chatters, DO 11/16/2021 2:18:45 PM This report has been signed electronically. Number of Addenda: 0

## 2021-11-16 NOTE — Telephone Encounter (Signed)
Pharmacy team - are you able to schedule patient for counseling and initiation of methotrexate?

## 2021-11-16 NOTE — Discharge Instructions (Signed)
EGD Discharge instructions Please read the instructions outlined below and refer to this sheet in the next few weeks. These discharge instructions provide you with general information on caring for yourself after you leave the hospital. Your doctor may also give you specific instructions. While your treatment has been planned according to the most current medical practices available, unavoidable complications occasionally occur. If you have any problems or questions after discharge, please call your doctor. ACTIVITY You may resume your regular activity but move at a slower pace for the next 24 hours.  Take frequent rest periods for the next 24 hours.  Walking will help expel (get rid of) the air and reduce the bloated feeling in your abdomen.  No driving for 24 hours (because of the anesthesia (medicine) used during the test).  You may shower.  Do not sign any important legal documents or operate any machinery for 24 hours (because of the anesthesia used during the test).  NUTRITION Drink plenty of fluids.  You may resume your normal diet.  Begin with a light meal and progress to your normal diet.  Avoid alcoholic beverages for 24 hours or as instructed by your caregiver.  MEDICATIONS You may resume your normal medications unless your caregiver tells you otherwise.  WHAT YOU CAN EXPECT TODAY You may experience abdominal discomfort such as a feeling of fullness or gas pains.  FOLLOW-UP Your doctor will discuss the results of your test with you.  SEEK IMMEDIATE MEDICAL ATTENTION IF ANY OF THE FOLLOWING OCCUR: Excessive nausea (feeling sick to your stomach) and/or vomiting.  Severe abdominal pain and distention (swelling).  Trouble swallowing.  Temperature over 101 F (37.8 C).  Rectal bleeding or vomiting of blood.    Colonoscopy Discharge Instructions  Read the instructions outlined below and refer to this sheet in the next few weeks. These discharge instructions provide you with  general information on caring for yourself after you leave the hospital. Your doctor may also give you specific instructions. While your treatment has been planned according to the most current medical practices available, unavoidable complications occasionally occur.   ACTIVITY You may resume your regular activity, but move at a slower pace for the next 24 hours.  Take frequent rest periods for the next 24 hours.  Walking will help get rid of the air and reduce the bloated feeling in your belly (abdomen).  No driving for 24 hours (because of the medicine (anesthesia) used during the test).   Do not sign any important legal documents or operate any machinery for 24 hours (because of the anesthesia used during the test).  NUTRITION Drink plenty of fluids.  You may resume your normal diet as instructed by your doctor.  Begin with a light meal and progress to your normal diet. Heavy or fried foods are harder to digest and may make you feel sick to your stomach (nauseated).  Avoid alcoholic beverages for 24 hours or as instructed.  MEDICATIONS You may resume your normal medications unless your doctor tells you otherwise.  WHAT YOU CAN EXPECT TODAY Some feelings of bloating in the abdomen.  Passage of more gas than usual.  Spotting of blood in your stool or on the toilet paper.  IF YOU HAD POLYPS REMOVED DURING THE COLONOSCOPY: No aspirin products for 7 days or as instructed.  No alcohol for 7 days or as instructed.  Eat a soft diet for the next 24 hours.  FINDING OUT THE RESULTS OF YOUR TEST Not all test results are available  during your visit. If your test results are not back during the visit, make an appointment with your caregiver to find out the results. Do not assume everything is normal if you have not heard from your caregiver or the medical facility. It is important for you to follow up on all of your test results.  SEEK IMMEDIATE MEDICAL ATTENTION IF: You have more than a spotting of  blood in your stool.  Your belly is swollen (abdominal distention).  You are nauseated or vomiting.  You have a temperature over 101.  You have abdominal pain or discomfort that is severe or gets worse throughout the day.   Your EGD revealed mild amount inflammation in your stomach.  I took biopsies of this to rule out infection with a bacteria called H. pylori.  Await pathology results, my office will contact you. Continue on Omeprazole 40 mg twice daily.   Your colonoscopy was relatively unremarkable.  I did not find any polyps or evidence of colon cancer.  I recommend repeating colonoscopy in 10 years for colon cancer screening purposes.  You do have internal hemorrhoids. I would recommend increasing fiber in your diet or adding OTC Benefiber/Metamucil. Be sure to drink at least 4 to 6 glasses of water daily. Consider hemorrhoid banding if bleeding continues.   Follow-up with GI in 4 months     I hope you have a great rest of your week!  Elon Alas. Abbey Chatters, D.O. Gastroenterology and Hepatology [facility]

## 2021-11-16 NOTE — Anesthesia Preprocedure Evaluation (Signed)
Anesthesia Evaluation  Patient identified by MRN, date of birth, ID band Patient awake    Reviewed: Allergy & Precautions, H&P , NPO status , Patient's Chart, lab work & pertinent test results, reviewed documented beta blocker date and time   Airway Mallampati: II  TM Distance: >3 FB Neck ROM: full    Dental no notable dental hx.    Pulmonary neg pulmonary ROS,    Pulmonary exam normal breath sounds clear to auscultation       Cardiovascular Exercise Tolerance: Good hypertension, negative cardio ROS   Rhythm:regular Rate:Normal     Neuro/Psych  Headaches, PSYCHIATRIC DISORDERS Anxiety Depression    GI/Hepatic Neg liver ROS, GERD  Medicated,  Endo/Other  diabetes, Type 2  Renal/GU Renal disease  negative genitourinary   Musculoskeletal   Abdominal   Peds  Hematology negative hematology ROS (+)   Anesthesia Other Findings   Reproductive/Obstetrics negative OB ROS                             Anesthesia Physical Anesthesia Plan  ASA: 3  Anesthesia Plan: General   Post-op Pain Management:    Induction:   PONV Risk Score and Plan: Propofol infusion  Airway Management Planned:   Additional Equipment:   Intra-op Plan:   Post-operative Plan:   Informed Consent: I have reviewed the patients History and Physical, chart, labs and discussed the procedure including the risks, benefits and alternatives for the proposed anesthesia with the patient or authorized representative who has indicated his/her understanding and acceptance.     Dental Advisory Given  Plan Discussed with: CRNA  Anesthesia Plan Comments:         Anesthesia Quick Evaluation

## 2021-11-16 NOTE — Interval H&P Note (Signed)
History and Physical Interval Note:  11/16/2021 1:34 PM  Courtney Hale  has presented today for surgery, with the diagnosis of GERD, RECTAL BLEEDING, EPIGASTRIC PAIN.  The various methods of treatment have been discussed with the patient and family. After consideration of risks, benefits and other options for treatment, the patient has consented to  Procedure(s) with comments: COLONOSCOPY WITH PROPOFOL (N/A) - 2:00pm ESOPHAGOGASTRODUODENOSCOPY (EGD) WITH PROPOFOL (N/A) as a surgical intervention.  The patient's history has been reviewed, patient examined, no change in status, stable for surgery.  I have reviewed the patient's chart and labs.  Questions were answered to the patient's satisfaction.     Eloise Harman

## 2021-11-16 NOTE — Anesthesia Postprocedure Evaluation (Signed)
Anesthesia Post Note  Patient: Courtney Hale  Procedure(s) Performed: COLONOSCOPY WITH PROPOFOL ESOPHAGOGASTRODUODENOSCOPY (EGD) WITH PROPOFOL BIOPSY  Patient location during evaluation: Phase II Anesthesia Type: General Level of consciousness: awake Pain management: pain level controlled Vital Signs Assessment: post-procedure vital signs reviewed and stable Respiratory status: spontaneous breathing and respiratory function stable Cardiovascular status: blood pressure returned to baseline and stable Postop Assessment: no headache and no apparent nausea or vomiting Anesthetic complications: no Comments: Late entry   No notable events documented.   Last Vitals:  Vitals:   11/16/21 1306 11/16/21 1437  BP: 115/80 99/71  Pulse: 81 77  Resp: (!) 21 15  Temp: 36.8 C   SpO2: 96% 97%    Last Pain:  Vitals:   11/16/21 1437  TempSrc: Oral  PainSc: [place]

## 2021-11-16 NOTE — Op Note (Signed)
[facility] Patient Name: Courtney Hale Procedure Date: 11/16/2021 2:17 PM MRN: [id number] Date of Birth: 12-30-1965 Attending MD: Elon Alas. Abbey Chatters DO CSN: [id number] Age: 56 Admit Type: Outpatient Procedure:                Colonoscopy Indications:              Abdominal pain in the left lower quadrant, Rectal                            bleeding Providers:                Elon Alas. Abbey Chatters, DO, Lambert Mody, Hughie Closs, RN, Vista Mink, Technician Referring MD:              Medicines:                See the Anesthesia note for documentation of the                            administered medications Complications:            No immediate complications. Estimated Blood Loss:     Estimated blood loss: none. Procedure:                Pre-Anesthesia Assessment:                           - The anesthesia plan was to use monitored                            anesthesia care (MAC).                           After obtaining informed consent, the colonoscope                            was passed under direct vision. Throughout the                            procedure, the patient's blood pressure, pulse, and                            oxygen saturations were monitored continuously. The                            [id number]-HQ190L ([id number]) scope was introduced through                            the anus and advanced to the the cecum, identified                            by appendiceal orifice and ileocecal valve. The                            colonoscopy was performed without difficulty. The  patient tolerated the procedure well. The quality                            of the bowel preparation was evaluated using the                            BBPS [facility] Bowel Preparation Scale) with scores                            of: Right Colon = 2 (minor amount of residual                            staining, small fragments of stool  and/or opaque                            liquid, but mucosa seen well), Transverse Colon = 2                            (minor amount of residual staining, small fragments                            of stool and/or opaque liquid, but mucosa seen                            well) and Left Colon = 2 (minor amount of residual                            staining, small fragments of stool and/or opaque                            liquid, but mucosa seen well). The total BBPS score                            equals 6. The quality of the bowel preparation was                            fair. Scope In: 2:19:32 PM Scope Out: 2:31:56 PM Scope Withdrawal Time: 0 hours 9 minutes 1 second  Total Procedure Duration: 0 hours 12 minutes 24 seconds  Findings:      The perianal and digital rectal examinations were normal.      Non-bleeding internal hemorrhoids were found during endoscopy.      The exam was otherwise without abnormality. Impression:               - Preparation of the colon was fair.                           - Non-bleeding internal hemorrhoids.                           - The examination was otherwise normal.                           - No  specimens collected. Moderate Sedation:      Per Anesthesia Care Recommendation:           - Patient has a contact number available for                            emergencies. The signs and symptoms of potential                            delayed complications were discussed with the                            patient. Return to normal activities tomorrow.                            Written discharge instructions were provided to the                            patient.                           - Resume previous diet.                           - Continue present medications.                           - Repeat colonoscopy in 10 years for screening                            purposes.                           - Return to GI clinic in 4 months. Consider                             hemorrhoid banding if bleeding continues. Procedure Code(s):        --- Professional ---                           [phone], Colonoscopy, flexible; diagnostic, including                            collection of specimen(s) by brushing or washing,                            when performed (separate procedure) Diagnosis Code(s):        --- Professional ---                           K64.8, Other hemorrhoids                           R10.32, Left lower quadrant pain                           K62.5, Hemorrhage of anus and rectum CPT copyright 2019 American Medical  Association. All rights reserved. The codes documented in this report are preliminary and upon coder review may  be revised to meet current compliance requirements. Elon Alas. Abbey Chatters, DO [place] Abbey Chatters, DO 11/16/2021 2:34:38 PM This report has been signed electronically. Number of Addenda: 0

## 2021-11-17 ENCOUNTER — Encounter (HOSPITAL_COMMUNITY): Payer: Self-pay | Admitting: Internal Medicine

## 2021-11-17 ENCOUNTER — Telehealth: Payer: Self-pay | Admitting: Pulmonary Disease

## 2021-11-17 NOTE — Telephone Encounter (Signed)
Pharmacy Note  Subjective: Patient called today by [facility] team for methotrexate initiation and counseling for sarcoidosis. She currently takes prednisone 60m once daily  Objective: CBC    Component Value Date/Time   WBC 10.2 10/19/2021 2336   RBC 4.40 10/19/2021 2336   HGB 13.4 10/19/2021 2336   HGB 14.2 01/26/2021 1429   HCT 39.6 10/19/2021 2336   HCT 41.7 01/26/2021 1429   PLT 294 10/19/2021 2336   PLT 320 01/26/2021 1429   MCV 90.0 10/19/2021 2336   MCV 87 01/26/2021 1429   MCH 30.5 10/19/2021 2336   MCHC 33.8 10/19/2021 2336   RDW 13.2 10/19/2021 2336   RDW 12.8 01/26/2021 1429   LYMPHSABS 3.5 10/19/2021 2336   LYMPHSABS 3.7 (H) 01/26/2021 1429   MONOABS 0.8 10/19/2021 2336   EOSABS 0.0 10/19/2021 2336   EOSABS 0.1 01/26/2021 1429   BASOSABS 0.0 10/19/2021 2336   BASOSABS 0.0 01/26/2021 1429    CMP     Component Value Date/Time   NA 140 10/19/2021 2336   NA 143 01/26/2021 1429   K 3.7 10/19/2021 2336   CL 103 10/19/2021 2336   CO2 28 10/19/2021 2336   GLUCOSE 239 (H) 10/19/2021 2336   BUN 15 10/19/2021 2336   BUN 11 01/26/2021 1429   CREATININE 0.63 10/19/2021 2336   CALCIUM 9.1 10/19/2021 2336   PROT 6.8 08/26/2021 1253   PROT 7.1 01/26/2021 1429   ALBUMIN 3.8 08/26/2021 1253   ALBUMIN 4.6 01/26/2021 1429   AST 24 08/26/2021 1253   ALT 36 08/26/2021 1253   ALKPHOS 76 08/26/2021 1253   BILITOT 0.4 08/26/2021 1253   BILITOT 0.3 01/26/2021 1429   GFRNONAA >60 10/19/2021 2336   GFRAA 121 08/27/2020 1517    Baseline Immunosuppressant Therapy Labs Orders placed today.  Chest-xray:  08/26/21 - No active cardiopulmonary disease.  Contraception: hysterectomy in 2016/2017  Alcohol use: not since starting prednisone. States her last dose was 5-6 months ago. Previously, she used to occasionally drink three times a year at social events.  Assessment/Plan:   Patient was counseled on the purpose, proper use, and adverse effects of  methotrexate including nausea, infection, and signs and symptoms of pneumonitis. Discussed that there is the possibility of an increased risk of malignancy, specifically lymphomas, but it is not well understood if this increased risk is due to the medication or the disease state.  Instructed patient that medication should be held for infection and prior to surgery.  Advised patient to avoid live vaccines. Recommend annual influenza, Pneumovax 23, Prevnar 13, and Shingrix as indicated.   Reviewed instructions with patient to take methotrexate weekly along with folic acid daily.  Discussed the importance of frequent monitoring of kidney and liver function and blood counts, and provided patient with standing lab instructions.  Counseled patient to avoid NSAIDs and alcohol while on methotrexate.    Patient voiced understanding.  Patient consented to methotrexate use.   Patient will stop by clinic tomorrow, 11/18/21 to have high-risk medication labs completed prior to starting MTX (orders below). She did confirm she has not been tested for HIV or Hepatitis in the past.  Dose of methotrexate will be as follows: - Week 1 and Week 2: MTX 174mweekly along with folic acid 1m13maily x 2 weeks. Take with prednisone 66m96mily. - Week 3 and Week 4: MTX 15 mg weekly along with folic acid 2mg 28mly x 2 weeks. Decrease prednisone to 30mg 18my. REPEAT LABS. - Week 5  and Week 6: MTX 20 mg weekly along with folic acid 26m daily. Decrease prednisone to 272mdaily.  Orders Placed This Encounter  Procedures   QuantiFERON-TB Gold Plus    MTX monitoring MTX monitoring    Standing Status:   Future    Standing Expiration Date:   11/17/2022   Comp Met (CMET)    MTX monitoring    Standing Status:   Future    Standing Expiration Date:   11/17/2022   CBC with Differential/Platelet    MTX monitoring MTX monitoring    Standing Status:   Future    Standing Expiration Date:   11/17/2022   Hepatitis B Surface AntiGEN    MTX  monitoring    Standing Status:   Future    Standing Expiration Date:   11/17/2022   Hepatitis B Core Antibody, IgM    MTX monitoring    Standing Status:   Future    Standing Expiration Date:   11/17/2022   Hepatitis C Antibody    MTX monitoring    Standing Status:   Future    Standing Expiration Date:   11/17/2022   HIV antibody (with reflex)    Standing Status:   Future    Standing Expiration Date:   11/17/2022   DeKnox SalivaPharmD, MPH, BCPS, CPP Clinical Pharmacist (Rheumatology and Pulmonology)

## 2021-11-17 NOTE — Telephone Encounter (Signed)
Staff, please change labs ordered by pharmacy to [facility] on [facility] location in [place] per patient request.

## 2021-11-17 NOTE — Telephone Encounter (Signed)
Counseled patient on methotrexate in prior to telephone encounter. Closing this call.  Knox Saliva, PharmD, MPH, BCPS Clinical Pharmacist (Rheumatology and Pulmonology)

## 2021-11-18 DIAGNOSIS — Z79899 Other long term (current) drug therapy: Secondary | ICD-10-CM | POA: Diagnosis not present

## 2021-11-18 DIAGNOSIS — Z111 Encounter for screening for respiratory tuberculosis: Secondary | ICD-10-CM | POA: Diagnosis not present

## 2021-11-18 DIAGNOSIS — D849 Immunodeficiency, unspecified: Secondary | ICD-10-CM | POA: Diagnosis not present

## 2021-11-18 DIAGNOSIS — D869 Sarcoidosis, unspecified: Secondary | ICD-10-CM | POA: Diagnosis not present

## 2021-11-18 LAB — SURGICAL PATHOLOGY

## 2021-11-18 NOTE — Telephone Encounter (Signed)
Called and spoke with patient to make sure that she knew that labs had been faxed over to [facility] in [place]. Patient expressed understanding and states that she went this morning to get them done. Nothing further needed at this time.

## 2021-11-18 NOTE — Telephone Encounter (Signed)
Lab orders released for [facility]. Faxed to [facility] this morning. Notified patient via MyChart  Knox Saliva, PharmD, MPH, BCPS Clinical Pharmacist (Rheumatology and Pulmonology)

## 2021-11-22 LAB — QUANTIFERON-TB GOLD PLUS
QuantiFERON Mitogen Value: 0.42 IU/mL
QuantiFERON Nil Value: 0.02 IU/mL
QuantiFERON TB1 Ag Value: 0.02 IU/mL
QuantiFERON TB2 Ag Value: 0.02 IU/mL
QuantiFERON-TB Gold Plus: UNDETERMINED — AB

## 2021-11-22 LAB — COMPREHENSIVE METABOLIC PANEL
ALT: 29 IU/L (ref 0–32)
AST: 16 IU/L (ref 0–40)
Albumin/Globulin Ratio: 2.2 (ref 1.2–2.2)
Albumin: 4.2 g/dL (ref 3.8–4.9)
Alkaline Phosphatase: 60 IU/L (ref 44–121)
BUN/Creatinine Ratio: 25 — ABNORMAL HIGH (ref 9–23)
BUN: 19 mg/dL (ref 6–24)
Bilirubin Total: 0.3 mg/dL (ref 0.0–1.2)
CO2: 21 mmol/L (ref 20–29)
Calcium: 9.2 mg/dL (ref 8.7–10.2)
Chloride: 100 mmol/L (ref 96–106)
Creatinine, Ser: 0.76 mg/dL (ref 0.57–1.00)
Globulin, Total: 1.9 g/dL (ref 1.5–4.5)
Glucose: 199 mg/dL — ABNORMAL HIGH (ref 70–99)
Potassium: 4.1 mmol/L (ref 3.5–5.2)
Sodium: 136 mmol/L (ref 134–144)
Total Protein: 6.1 g/dL (ref 6.0–8.5)
eGFR: 94 mL/min/{1.73_m2} (ref >59)

## 2021-11-22 LAB — CBC WITH DIFFERENTIAL/PLATELET
Basophils Absolute: 0 10*3/uL (ref 0.0–0.2)
Basos: 0 %
EOS (ABSOLUTE): 0 10*3/uL (ref 0.0–0.4)
Eos: 0 %
Hematocrit: 38.9 % (ref 34.0–46.6)
Hemoglobin: 13.4 g/dL (ref 11.1–15.9)
Immature Grans (Abs): 0.1 10*3/uL (ref 0.0–0.1)
Immature Granulocytes: 1 %
Lymphocytes Absolute: 1.8 10*3/uL (ref 0.7–3.1)
Lymphs: 16 %
MCH: 29.8 pg (ref 26.6–33.0)
MCHC: 34.4 g/dL (ref 31.5–35.7)
MCV: 87 fL (ref 79–97)
Monocytes Absolute: 0.3 10*3/uL (ref 0.1–0.9)
Monocytes: 2 %
Neutrophils Absolute: 9 10*3/uL — ABNORMAL HIGH (ref 1.4–7.0)
Neutrophils: 81 %
Platelets: 322 10*3/uL (ref 150–450)
RBC: 4.49 x10E6/uL (ref 3.77–5.28)
RDW: 12.5 % (ref 11.7–15.4)
WBC: 11.2 10*3/uL — ABNORMAL HIGH (ref 3.4–10.8)

## 2021-11-22 LAB — HEPATITIS B SURFACE ANTIGEN: Hepatitis B Surface Ag: NEGATIVE

## 2021-11-22 LAB — HEPATITIS B CORE ANTIBODY, IGM: Hep B C IgM: NEGATIVE

## 2021-11-22 LAB — HIV ANTIBODY (ROUTINE TESTING W REFLEX): HIV Screen 4th Generation wRfx: NONREACTIVE

## 2021-11-22 LAB — HEPATITIS C ANTIBODY: Hep C Virus Ab: <0.1 s/co ratio (ref 0.0–0.9)

## 2021-11-25 NOTE — Telephone Encounter (Signed)
Would like to repeat it.

## 2021-11-25 NOTE — Patient Instructions (Signed)

## 2021-11-26 ENCOUNTER — Telehealth: Payer: Self-pay | Admitting: *Deleted

## 2021-11-26 ENCOUNTER — Encounter: Payer: Self-pay | Admitting: Nurse Practitioner

## 2021-11-26 ENCOUNTER — Ambulatory Visit: Payer: Medicaid Other | Admitting: Nurse Practitioner

## 2021-11-26 ENCOUNTER — Other Ambulatory Visit: Payer: Self-pay

## 2021-11-26 VITALS — BP 104/74 | HR 70 | Ht 62.5 in | Wt 193.0 lb

## 2021-11-26 DIAGNOSIS — I10 Essential (primary) hypertension: Secondary | ICD-10-CM | POA: Diagnosis not present

## 2021-11-26 DIAGNOSIS — E782 Mixed hyperlipidemia: Secondary | ICD-10-CM

## 2021-11-26 DIAGNOSIS — E1165 Type 2 diabetes mellitus with hyperglycemia: Secondary | ICD-10-CM | POA: Diagnosis not present

## 2021-11-26 MED ORDER — INSULIN GLARGINE 100 UNIT/ML SOLOSTAR PEN: 30.0000 [IU] | PEN_INJECTOR | Freq: Every day | SUBCUTANEOUS | 0 refills | Status: DC

## 2021-11-26 MED ORDER — NOVOLOG FLEXPEN 100 UNIT/ML ~~LOC~~ SOPN: 10.0000 [IU] | PEN_INJECTOR | Freq: Three times a day (TID) | SUBCUTANEOUS | 1 refills | Status: DC

## 2021-11-26 NOTE — Progress Notes (Signed)
Endocrinology Consult Note       11/26/2021, 11:26 AM   Subjective:    Patient ID: Courtney Hale, female    DOB: [birth year].  CAIDENCE KASEMAN is being seen in consultation for management of currently uncontrolled symptomatic diabetes requested by  Coral Spikes, DO.   Past Medical History:  Diagnosis Date   Complication of anesthesia    woke up during endoscopy and colonoscopy   Depression    Hemorrhoids    History of esophageal stricture    2011  peptic w/  dilatation   History of gastritis    2011   History of melanoma excision    2015  left leg/   12-09-2015 back    Hypertension    Microhematuria    Migraines    Nephrolithiasis    left side non-obstructive    Right ureteral stone    Sarcoidosis 07/24/2021   Urgency of urination    Wears contact lenses     Past Surgical History:  Procedure Laterality Date   ABDOMINAL HYSTERECTOMY     BIOPSY  11/16/2021   Procedure: BIOPSY;  Surgeon: Eloise Harman, DO;  [facility];  Service: Endoscopy;;   CARPAL TUNNEL RELEASE Right 01-05- 2015   COLONOSCOPY N/A 08/27/2015   Procedure: COLONOSCOPY;  Surgeon: Danie Binder, MD;  [facility]: [facility];  Service: Endoscopy;  Laterality: N/A;  0830   COLONOSCOPY WITH PROPOFOL N/A 11/16/2021   Procedure: COLONOSCOPY WITH PROPOFOL;  Surgeon: Eloise Harman, DO;  [facility]: [facility];  Service: Endoscopy;  Laterality: N/A;  2:00pm   CYST EXCISION  2012     right hand   CYSTOSCOPY W/ URETERAL STENT PLACEMENT Right 12/22/2015   Procedure: CYSTOSCOPY WITH RETROGRADE PYELOGRAM/URETERAL STENT PLACEMENT;  Surgeon: Nickie Retort, MD;  Location: [facility];  Service: Urology;  Laterality: Right;   CYSTOSCOPY WITH RETROGRADE PYELOGRAM, URETEROSCOPY AND STENT PLACEMENT Right 01/05/2016   Procedure: CYSTOSCOPY WITH RETROGRADE PYELOGRAM, URETEROSCOPY AND STENT REPLACEMENT;  Surgeon: Nickie Retort, MD;   Location: [facility];  Service: Urology;  Laterality: Right;   DILATION AND CURETTAGE OF UTERUS  03-12-2009   w/  Suction   double balloon enteroscopy     Dr. Arsenio Loader at [facility]: no erosions, no evidence of Crohn's disease, path without Crohn's.    EGD with push enteroscopy  02-12-2010    patent distal peptic stricture with diffuse antral erythema, normal D1 and D2    ESOPHAGOGASTRODUODENOSCOPY (EGD) WITH PROPOFOL N/A 11/16/2021   Procedure: ESOPHAGOGASTRODUODENOSCOPY (EGD) WITH PROPOFOL;  Surgeon: Eloise Harman, DO;  [facility];  Service: Endoscopy;  Laterality: N/A;   LAPAROSCOPIC ASSISTED VAGINAL HYSTERECTOMY  10-26-2014   w/  Bilateral Salpingoophorectomy   STONE EXTRACTION WITH BASKET Right 01/05/2016   Procedure: STONE EXTRACTION WITH BASKET;  Surgeon: Nickie Retort, MD;  Location: [facility];  Service: Urology;  Laterality: Right;   TONSILLECTOMY  1998  approx    Social History   Socioeconomic History   Marital status: Single    Spouse name: Not on file   Number of children: Not on file   Years of education: Not  on file   Highest education level: Not on file  Occupational History   Not on file  Tobacco Use   Smoking status: Never   Smokeless tobacco: Never  Vaping Use   Vaping Use: Never used  Substance and Sexual Activity   Alcohol use: Yes    Comment: occassionally   Drug use: No   Sexual activity: Not on file  Other Topics Concern   Not on file  Social History Narrative   Not on file   Social Determinants of Health   Financial Resource Strain: Low Risk    Difficulty of Paying Living Expenses: Not hard at all  Food Insecurity: No Food Insecurity   Worried About Charity fundraiser in the Last Year: Never true   [place] in the Last Year: Never true  Transportation Needs: No Transportation Needs   Lack of Transportation (Medical): No   Lack of Transportation (Non-Medical): No  Physical Activity:  Inactive   Days of Exercise per Week: 0 days   Minutes of Exercise per Session: 0 min  Stress: Stress Concern Present   Feeling of Stress : Very much  Social Connections: Socially Isolated   Frequency of Communication with Friends and Family: More than three times a week   Frequency of Social Gatherings with Friends and Family: More than three times a week   Attends Religious Services: Never   Marine scientist or Organizations: No   Attends Music therapist: Never   Marital Status: Never married    Family History  Problem Relation Age of Onset   Hypertension Mother    Thyroid disease Mother    Diabetes Mother    Hyperlipidemia Mother    Stroke Mother    Hyperlipidemia Father    Hypertension Father    Heart attack Father    Heart failure Father    Colon cancer Neg Hx     Outpatient Encounter Medications as of 11/26/2021  Medication Sig   acetaminophen (TYLENOL) 500 MG tablet Take 1,000 mg by mouth every 6 (six) hours as needed for moderate pain.   albuterol (VENTOLIN HFA) 108 (90 Base) MCG/ACT inhaler Inhale 2 puffs into the lungs every 6 (six) hours as needed for wheezing or shortness of breath.   busPIRone (BUSPAR) 5 MG tablet TAKE 1 TABLET BY MOUTH THREE TIMES DAILY (Patient taking differently: Take 5 mg by mouth 3 (three) times daily as needed (anxiety).)   calcium carbonate (TUMS - DOSED IN MG ELEMENTAL CALCIUM) 500 MG chewable tablet Chew 1,500 mg by mouth 2 (two) times daily as needed for indigestion or heartburn.   Carboxymethylcellul-Glycerin (LUBRICATING EYE DROPS OP) Place 1 drop into both eyes daily as needed (dry eyes).   Chromium 1000 MCG TABS Take 1,000 mcg by mouth daily.   CINNAMON PO Take 2,000 mg by mouth daily.   Continuous Blood Gluc Receiver (FREESTYLE LIBRE 2 READER) DEVI Use as directed to check blood sugars.   Continuous Blood Gluc Sensor (FREESTYLE LIBRE 2 SENSOR) MISC Use sensor as directed. Change every 10-14 days.   Cyanocobalamin  (B-12) 2000 MCG TABS Take 2,000 mcg by mouth daily.   doxycycline (VIBRA-TABS) 100 MG tablet Take 1 tablet (100 mg total) by mouth 2 (two) times daily.   EPINEPHrine 0.3 mg/0.3 mL IJ SOAJ injection Inject 0.3 mg into the muscle as needed for anaphylaxis.   hydrochlorothiazide (HYDRODIURIL) 25 MG tablet Take 1 tablet (25 mg total) by mouth daily.   HYDROcodone-acetaminophen (NORCO/VICODIN) 5-325  MG tablet Take 1 tablet by mouth every 8 (eight) hours as needed for moderate pain.   insulin aspart (NOVOLOG FLEXPEN) 100 UNIT/ML FlexPen Inject 10-16 Units into the skin 3 (three) times daily with meals. 6 units with largest meal of the day. 5 units with breakfast and 5 units with supper   insulin glargine (LANTUS) 100 UNIT/ML Solostar Pen Inject 30 Units into the skin at bedtime.   Insulin Pen Needle 32G X 4 MM MISC Use as directed to administer insulin.   losartan (COZAAR) 100 MG tablet TAKE 1 TABLET BY MOUTH ONCE DAILY.   meclizine (ANTIVERT) 25 MG tablet Take 25 mg by mouth 3 (three) times daily as needed for dizziness.   metFORMIN (GLUCOPHAGE) 850 MG tablet Take 1 tablet (850 mg total) by mouth 2 (two) times daily with a meal.   omeprazole (PRILOSEC) 40 MG capsule Take 1 capsule (40 mg total) by mouth 2 (two) times daily before a meal.   ondansetron (ZOFRAN-ODT) 4 MG disintegrating tablet Take 4 mg by mouth every 8 (eight) hours as needed for nausea or vomiting.   PARoxetine (PAXIL) 40 MG tablet Take 1 tablet (40 mg total) by mouth every morning.   predniSONE (DELTASONE) 10 MG tablet Take 4 tablets (40 mg total) by mouth daily with breakfast.   Probiotic Product (PROBIOTIC PO) Take 1 capsule by mouth daily.   prochlorperazine (COMPAZINE) 10 MG tablet Take 1 tablet (10 mg total) by mouth every 6 (six) hours as needed for nausea or vomiting.   rizatriptan (MAXALT-MLT) 10 MG disintegrating tablet Take 1 tablet (10 mg total) by mouth as needed for migraine. May repeat in 2 hours if needed. Max 2 per 24  hours   traZODone (DESYREL) 100 MG tablet Take 1 tablet (100 mg total) by mouth at bedtime as needed for sleep.   valACYclovir (VALTREX) 500 MG tablet Take 500 mg by mouth 2 (two) times daily as needed (outbreaks).   [DISCONTINUED] insulin aspart (NOVOLOG FLEXPEN) 100 UNIT/ML FlexPen Inject 8-14 Units into the skin 3 (three) times daily with meals. 6 units with largest meal of the day. 5 units with breakfast and 5 units with supper   [DISCONTINUED] insulin glargine (LANTUS) 100 UNIT/ML Solostar Pen Inject 35 Units into the skin at bedtime.   No facility-administered encounter medications on file as of 11/26/2021.    ALLERGIES: Allergies  Allergen Reactions   Topamax [Topiramate] Other (See Comments)    Developed kidney stones   Fluorouracil Swelling    Burning skin   Sulfa Antibiotics Other (See Comments)    Burning sensation, flushing to skin    VACCINATION STATUS: Immunization History  Administered Date(s) Administered   Influenza,inj,Quad PF,6+ Mos 09/09/2016, 08/29/2017, 09/27/2019, 10/30/2020, 08/14/2021   Influenza-Unspecified 08/22/2013, 07/22/2014, 08/08/2015   MMR 01/22/2011   PFIZER(Purple Top)SARS-COV-2 Vaccination 12/30/2019, 01/20/2020   Tdap 01/22/2011, 04/17/2014    Diabetes She presents for her follow-up diabetic visit. She has type 2 diabetes mellitus. Onset time: diagnosed at approx age of 58. Her disease course has been improving. Hypoglycemia symptoms include sweats and tremors. There are no diabetic associated symptoms. Hypoglycemia complications include nocturnal hypoglycemia. There are no diabetic complications. Risk factors for coronary artery disease include diabetes mellitus, dyslipidemia, family history, hypertension and obesity. Current diabetic treatment includes intensive insulin program and oral agent (monotherapy). She is compliant with treatment most of the time. Her weight is decreasing steadily. She is following a generally healthy diet. When asked  about meal planning, she reported none. She has  not had a previous visit with a dietitian. She participates in exercise intermittently. Her home blood glucose trend is decreasing steadily. Her breakfast blood glucose range is generally 70-90 mg/dl. Her lunch blood glucose range is generally 180-200 mg/dl. Her dinner blood glucose range is generally 180-200 mg/dl. Her bedtime blood glucose range is generally 180-200 mg/dl. Her overall blood glucose range is 130-140 mg/dl. (She presents today with her CGM, no logs, showing tight fasting and above target postprandial glycemic profile.  She was not due for another A1c today.  She is working on her new diet routine.  Analysis of her CGM shows TIR 59%, TAR 41%, TBR 0% with a GMI of 7.4%.  She does report some overnight lows in the 60s and has sometimes not taken her long-acting insulin if bedtime glucose was at 100.  She is still on oral prednisone for her sarcoidosis, has not yet seen her specialist about switching to different medication.) An ACE inhibitor/angiotensin II receptor blocker is being taken. She does not see a podiatrist.Eye exam is current.  Hyperlipidemia This is a chronic problem. The current episode started more than 1 year ago. The problem is uncontrolled. Recent lipid tests were reviewed and are high. Exacerbating diseases include diabetes and obesity. Factors aggravating her hyperlipidemia include thiazides and fatty foods. She is currently on no antihyperlipidemic treatment. Compliance problems include adherence to diet and adherence to exercise.  Risk factors for coronary artery disease include diabetes mellitus, dyslipidemia, family history, obesity and hypertension.  Hypertension This is a chronic problem. The current episode started more than 1 year ago. The problem has been resolved since onset. The problem is controlled. Associated symptoms include sweats. Agents associated with hypertension include steroids. Risk factors for coronary  artery disease include diabetes mellitus, dyslipidemia, family history and obesity. Past treatments include diuretics and angiotensin blockers. The current treatment provides significant improvement. There are no compliance problems.     Review of systems  Constitutional: + Minimally fluctuating body weight, current Body mass index is 34.74 kg/m., no fatigue, no subjective hyperthermia, no subjective hypothermia Eyes: no blurry vision, no xerophthalmia ENT: no sore throat, no nodules palpated in throat, no dysphagia/odynophagia, no hoarseness Cardiovascular: no chest pain, no shortness of breath, no palpitations, no leg swelling Respiratory: no cough, no shortness of breath Gastrointestinal: no nausea/vomiting/diarrhea Musculoskeletal: generalized aches and pains- says r/t sarcoidosis Skin: no rashes, no hyperemia Neurological: no tremors, no numbness, no tingling, no dizziness Psychiatric: no depression, no anxiety  Objective:     BP 104/74    Pulse 70    Ht 5' 2.5" (1.588 m)    Wt 193 lb (87.5 kg)    LMP 10/26/2014    BMI 34.74 kg/m   Wt Readings from Last 3 Encounters:  11/26/21 193 lb (87.5 kg)  11/11/21 195 lb 3.2 oz (88.5 kg)  11/11/21 196 lb 3.2 oz (89 kg)     BP Readings from Last 3 Encounters:  11/26/21 104/74  11/16/21 99/71  11/11/21 112/67     Physical Exam- Limited  Constitutional:  Body mass index is 34.74 kg/m. , not in acute distress, normal state of mind Eyes:  EOMI, no exophthalmos Neck: Supple Cardiovascular: RRR, no murmurs, rubs, or gallops, no edema Respiratory: Adequate breathing efforts, no crackles, rales, rhonchi, or wheezing Musculoskeletal: no gross deformities, strength intact in all four extremities, no gross restriction of joint movements Skin:  no rashes, no hyperemia Neurological: no tremor with outstretched hands    CMP ( most recent) CMP  Component Value Date/Time   NA 136 11/18/2021 1026   K 4.1 11/18/2021 1026   CL 100  11/18/2021 1026   CO2 21 11/18/2021 1026   GLUCOSE 199 (H) 11/18/2021 1026   GLUCOSE 239 (H) 10/19/2021 2336   BUN 19 11/18/2021 1026   CREATININE 0.76 11/18/2021 1026   CALCIUM 9.2 11/18/2021 1026   PROT 6.1 11/18/2021 1026   ALBUMIN 4.2 11/18/2021 1026   AST 16 11/18/2021 1026   ALT 29 11/18/2021 1026   ALKPHOS 60 11/18/2021 1026   BILITOT 0.3 11/18/2021 1026   GFRNONAA >60 10/19/2021 2336   GFRAA 121 08/27/2020 1517     Diabetic Labs (most recent): Lab Results  Component Value Date   HGBA1C 7.9 (H) 09/25/2021   HGBA1C 6.6 (H) 01/26/2021   HGBA1C 9.0 (H) 08/27/2020     Lipid Panel ( most recent) Lipid Panel     Component Value Date/Time   CHOL 258 (H) 09/25/2021 1134   TRIG 169 (H) 09/25/2021 1134   HDL 59 09/25/2021 1134   CHOLHDL 4.4 09/25/2021 1134   LDLCALC 168 (H) 09/25/2021 1134   LABVLDL 31 09/25/2021 1134      Lab Results  Component Value Date   TSH 0.996 08/27/2020   TSH 1.220 06/09/2016   TSH 0.785 08/13/2015           Assessment & Plan:   1) Uncontrolled type 2 diabetes mellitus with hyperglycemia ([place])  She presents today with her CGM, no logs, showing tight fasting and above target postprandial glycemic profile.  She was not due for another A1c today.  She is working on her new diet routine.  Analysis of her CGM shows TIR 59%, TAR 41%, TBR 0% with a GMI of 7.4%.  She does report some overnight lows in the 60s and has sometimes not taken her long-acting insulin if bedtime glucose was at 100.  She is still on oral prednisone for her sarcoidosis, has not yet seen her specialist about switching to different medication.  - ELIZETH WEINRICH has currently uncontrolled symptomatic type 2 DM since 56 years of age, with most recent A1c of 7.9 %.   -Recent labs reviewed.  - I had a long discussion with her about the progressive nature of diabetes and the pathology behind its complications. -her diabetes is complicated by chronic steroid use due to  sarcoidosis and she remains at a high risk for more acute and chronic complications which include CAD, CVA, CKD, retinopathy, and neuropathy. These are all discussed in detail with her.  The following Lifestyle Medicine recommendations according to [facility]) were discussed and offered to patient and she agrees to start the journey:  A. Whole Foods, Plant-based plate comprising of fruits and vegetables, plant-based proteins, whole-grain carbohydrates was discussed in detail with the patient.   A list for source of those nutrients were also provided to the patient.  Patient will use only water or unsweetened tea for hydration. B.  The need to stay away from risky substances including alcohol, smoking; obtaining 7 to 9 hours of restorative sleep, at least 150 minutes of moderate intensity exercise weekly, the importance of healthy social connections,  and stress reduction techniques were discussed. C.  A full color page of  Calorie density of various food groups per pound showing examples of each food groups was provided to the patient.  - Nutritional counseling repeated at each appointment due to patients tendency to fall back in to old habits.  -  The patient admits there is a room for improvement in their diet and drink choices. -  Suggestion is made for the patient to avoid simple carbohydrates from their diet including Cakes, Sweet Desserts / Pastries, Ice Cream, Soda (diet and regular), Sweet Tea, Candies, Chips, Cookies, Sweet Pastries, Store Bought Juices, Alcohol in Excess of 1-2 drinks a day, Artificial Sweeteners, Coffee Creamer, and "Sugar-free" Products. This will help patient to have stable blood glucose profile and potentially avoid unintended weight gain.   - I encouraged the patient to switch to unprocessed or minimally processed complex starch and increased protein intake (animal or plant source), fruits, and vegetables.   - Patient is advised to stick  to a routine mealtimes to eat 3 meals a day and avoid unnecessary snacks (to snack only to correct hypoglycemia).  - she will be scheduled with Jearld [place], RDN, CDE for diabetes education.  Has an appointment in the next several weeks.  - I have approached her with the following individualized plan to manage her diabetes and patient agrees:   -She is advised to lower her Lantus to 30 units SQ nightly and increase her Novolog to 10-16 units TID with meals if glucose is above 90 and she is eating to counteract her prednisone (Specific instructions on how to titrate insulin dosage based on glucose readings given to patient in writing).  She can continue her Metformin 850 mg po twice daily with meals.  -she is encouraged to continue monitoring glucose 4 times daily (using her CGM), before meals and before bed, to log their readings on the clinic sheets provided, and to call the clinic if she has readings less than 70 or above 300 for 3 tests in a row.  - she is warned not to take insulin without proper monitoring per orders. - Adjustment parameters are given to her for hypo and hyperglycemia in writing.  - she will be considered for incretin therapy as appropriate next visit. - She is allergic to sulfa meds.  - Specific targets for  A1c; LDL, HDL, and Triglycerides were discussed with the patient.  2) Blood Pressure /Hypertension:  her blood pressure is controlled to target.   she is advised to continue her current medications including Losartan 100 mg p.o. daily with breakfast and HCTZ 25 mg po daily.  3) Lipids/Hyperlipidemia:    Review of her recent lipid panel from 09/25/21 showed uncontrolled LDL at 168 and slightly elevated triglycerides of 169.  She is not currently on any lipid lowering medications.   4)  Weight/Diet:  her Body mass index is 34.74 kg/m.  -  clearly complicating her diabetes care.   she is a candidate for weight loss. I discussed with her the fact that loss of 5 - 10%  of her  current body weight will have the most impact on her diabetes management.  Exercise, and detailed carbohydrates information provided  -  detailed on discharge instructions.  5) Chronic Care/Health Maintenance: -she is on ACEI/ARB and not on Statin medications and is encouraged to initiate and continue to follow up with Ophthalmology, Dentist, Podiatrist at least yearly or according to recommendations, and advised to stay away from smoking. I have recommended yearly flu vaccine and pneumonia vaccine at least every 5 years; moderate intensity exercise for up to 150 minutes weekly; and sleep for at least 7 hours a day.  - she is advised to maintain close follow up with Coral Spikes, DO for primary care needs, as well as her  other providers for optimal and coordinated care.     I spent 42 minutes in the care of the patient today including review of labs from [place], Lipids, Thyroid Function, Hematology (current and previous including abstractions from other facilities); face-to-face time discussing  her blood glucose readings/logs, discussing hypoglycemia and hyperglycemia episodes and symptoms, medications doses, her options of short and long term treatment based on the latest standards of care / guidelines;  discussion about incorporating lifestyle medicine;  and documenting the encounter.    Please refer to Patient Instructions for Blood Glucose Monitoring and Insulin/Medications Dosing Guide"  in media tab for additional information. Please  also refer to " Patient Self Inventory" in the Media  tab for reviewed elements of pertinent patient history.  Benard Rink participated in the discussions, expressed understanding, and voiced agreement with the above plans.  All questions were answered to her satisfaction. she is encouraged to contact clinic should she have any questions or concerns prior to her return visit.     Follow up plan: - Return in about 3 months (around 02/23/2022) for  Diabetes F/U with A1c in office, No previsit labs, Bring meter and logs.    Rayetta Pigg, [facility] [address] [place], [place] [address] Phone: [phone] Fax: [phone]  11/26/2021, 11:26 AM

## 2021-11-26 NOTE — Telephone Encounter (Signed)
Patient calling and stated she needs a refill of her Hydrocodone sent to the pharmacy  Patient  is also requesting a referral to rheumatology due to the significant pain she is in. Patient states the [facility] can not see her til 06/23/2022

## 2021-11-26 NOTE — Telephone Encounter (Signed)
TB gold lab faxed to [facility]. I called patient to notify. She will plan to get lab completed tomorrow at [facility] in [place].  Knox Saliva, PharmD, MPH, BCPS Clinical Pharmacist (Rheumatology and Pulmonology)

## 2021-11-26 NOTE — Addendum Note (Signed)
Addended by: Cassandria Anger on: 11/26/2021 02:12 PM   Modules accepted: Orders

## 2021-11-27 ENCOUNTER — Other Ambulatory Visit: Payer: Self-pay | Admitting: Family Medicine

## 2021-11-27 DIAGNOSIS — D869 Sarcoidosis, unspecified: Secondary | ICD-10-CM | POA: Diagnosis not present

## 2021-11-27 DIAGNOSIS — Z111 Encounter for screening for respiratory tuberculosis: Secondary | ICD-10-CM | POA: Diagnosis not present

## 2021-11-27 DIAGNOSIS — D849 Immunodeficiency, unspecified: Secondary | ICD-10-CM | POA: Diagnosis not present

## 2021-11-27 DIAGNOSIS — Z79899 Other long term (current) drug therapy: Secondary | ICD-10-CM | POA: Diagnosis not present

## 2021-11-27 MED ORDER — HYDROCODONE-ACETAMINOPHEN 5-325 MG PO TABS: 1.0000 | ORAL_TABLET | Freq: Three times a day (TID) | ORAL | 0 refills | Status: DC | PRN

## 2021-11-30 ENCOUNTER — Other Ambulatory Visit: Payer: Self-pay | Admitting: Family Medicine

## 2021-11-30 DIAGNOSIS — D869 Sarcoidosis, unspecified: Secondary | ICD-10-CM

## 2021-11-30 NOTE — Telephone Encounter (Signed)
Coral Spikes, DO    I sent in pain medication. Will place referral.

## 2021-11-30 NOTE — Telephone Encounter (Signed)
Patient notified

## 2021-12-03 ENCOUNTER — Inpatient Hospital Stay (HOSPITAL_COMMUNITY): Payer: Medicaid Other | Attending: Hematology

## 2021-12-03 DIAGNOSIS — Z800 Family history of malignant neoplasm of digestive organs: Secondary | ICD-10-CM | POA: Diagnosis not present

## 2021-12-03 DIAGNOSIS — R591 Generalized enlarged lymph nodes: Secondary | ICD-10-CM | POA: Diagnosis not present

## 2021-12-03 DIAGNOSIS — R519 Headache, unspecified: Secondary | ICD-10-CM | POA: Diagnosis not present

## 2021-12-03 DIAGNOSIS — R110 Nausea: Secondary | ICD-10-CM | POA: Insufficient documentation

## 2021-12-03 DIAGNOSIS — M25552 Pain in left hip: Secondary | ICD-10-CM | POA: Insufficient documentation

## 2021-12-03 DIAGNOSIS — D869 Sarcoidosis, unspecified: Secondary | ICD-10-CM | POA: Diagnosis not present

## 2021-12-03 DIAGNOSIS — I10 Essential (primary) hypertension: Secondary | ICD-10-CM | POA: Insufficient documentation

## 2021-12-03 DIAGNOSIS — Z90710 Acquired absence of both cervix and uterus: Secondary | ICD-10-CM | POA: Diagnosis not present

## 2021-12-03 LAB — COMPREHENSIVE METABOLIC PANEL
ALT: 30 U/L (ref 0–44)
AST: 19 U/L (ref 15–41)
Albumin: 3.4 g/dL — ABNORMAL LOW (ref 3.5–5.0)
Alkaline Phosphatase: 47 U/L (ref 38–126)
Anion gap: 8 (ref 5–15)
BUN: 14 mg/dL (ref 6–20)
CO2: 25 mmol/L (ref 22–32)
Calcium: 8.6 mg/dL — ABNORMAL LOW (ref 8.9–10.3)
Chloride: 104 mmol/L (ref 98–111)
Creatinine, Ser: 0.62 mg/dL (ref 0.44–1.00)
GFR, Estimated: >60 mL/min (ref >60)
Glucose, Bld: 140 mg/dL — ABNORMAL HIGH (ref 70–99)
Potassium: 3.5 mmol/L (ref 3.5–5.1)
Sodium: 137 mmol/L (ref 135–145)
Total Bilirubin: 0.6 mg/dL (ref 0.3–1.2)
Total Protein: 5.9 g/dL — ABNORMAL LOW (ref 6.5–8.1)

## 2021-12-03 LAB — CBC WITH DIFFERENTIAL/PLATELET
Abs Immature Granulocytes: 0.08 10*3/uL — ABNORMAL HIGH (ref 0.00–0.07)
Basophils Absolute: 0.1 10*3/uL (ref 0.0–0.1)
Basophils Relative: 1 %
Eosinophils Absolute: 0.1 10*3/uL (ref 0.0–0.5)
Eosinophils Relative: 1 %
HCT: 38.9 % (ref 36.0–46.0)
Hemoglobin: 13.1 g/dL (ref 12.0–15.0)
Immature Granulocytes: 1 %
Lymphocytes Relative: 42 %
Lymphs Abs: 4.8 10*3/uL — ABNORMAL HIGH (ref 0.7–4.0)
MCH: 30.7 pg (ref 26.0–34.0)
MCHC: 33.7 g/dL (ref 30.0–36.0)
MCV: 91.1 fL (ref 80.0–100.0)
Monocytes Absolute: 0.7 10*3/uL (ref 0.1–1.0)
Monocytes Relative: 6 %
Neutro Abs: 5.7 10*3/uL (ref 1.7–7.7)
Neutrophils Relative %: 49 %
Platelets: 279 10*3/uL (ref 150–400)
RBC: 4.27 MIL/uL (ref 3.87–5.11)
RDW: 13.4 % (ref 11.5–15.5)
WBC: 11.4 10*3/uL — ABNORMAL HIGH (ref 4.0–10.5)
nRBC: 0 % (ref 0.0–0.2)

## 2021-12-03 LAB — QUANTIFERON-TB GOLD PLUS
QuantiFERON Mitogen Value: 4.13 IU/mL
QuantiFERON Nil Value: 0.36 IU/mL
QuantiFERON TB1 Ag Value: 0.2 IU/mL
QuantiFERON TB2 Ag Value: 0.15 IU/mL
QuantiFERON-TB Gold Plus: NEGATIVE

## 2021-12-03 LAB — LACTATE DEHYDROGENASE: LDH: 180 U/L (ref 98–192)

## 2021-12-03 MED ORDER — FOLIC ACID 1 MG PO TABS: ORAL_TABLET | ORAL | 1 refills | Status: DC

## 2021-12-03 MED ORDER — METHOTREXATE 2.5 MG PO TABS: ORAL_TABLET | ORAL | 0 refills | Status: DC

## 2021-12-03 NOTE — Addendum Note (Signed)
Addended by: Cassandria Anger on: 12/03/2021 04:37 PM   Modules accepted: Orders

## 2021-12-03 NOTE — Telephone Encounter (Signed)
Repeat TB gold negative 12/31/21.  Rx for methotrexate 10mg  weekly x 2 weeks, 15mg  weekly x 2 weeks, 20mg  weekly thereafter sent to pharmacy. Rx for folic acid sent to pharmacy.  ATC patient to review - left VM requesting return call if she has further questions. Will f/u  Plan: - Week 1 and Week 2: MTX 10mg  weekly along with folic acid 1mg  daily x 2 weeks. Take with prednisone 40mg  daily. - Week 3 and Week 4: MTX 15 mg weekly along with folic acid 2mg  daily x 2 weeks. Decrease prednisone to 30mg  daily. REPEAT LABS. - Week 5 and Week 6: MTX 20 mg weekly along with folic acid 2mg  daily. Decrease prednisone to 20mg  daily.  Knox Saliva, PharmD, MPH, BCPS Clinical Pharmacist (Rheumatology and Pulmonology)

## 2021-12-04 NOTE — Telephone Encounter (Signed)
ATC patient - unable to reach. Left VM requesting return call or advised that I will reach out on Monday.  Knox Saliva, PharmD, MPH, BCPS Clinical Pharmacist (Rheumatology and Pulmonology)

## 2021-12-04 NOTE — Telephone Encounter (Signed)
Returned call to patient regarding methotrexate. Reviewed that she should take methotrexate with biggest meal of day. Advised her to take prednisone in morning as before.  Knox Saliva, PharmD, MPH, BCPS Clinical Pharmacist (Rheumatology and Pulmonology)

## 2021-12-07 NOTE — Addendum Note (Signed)
Addended by: Cassandria Anger on: 12/07/2021 12:33 PM   Modules accepted: Orders

## 2021-12-09 ENCOUNTER — Telehealth: Payer: Self-pay | Admitting: Nurse Practitioner

## 2021-12-09 ENCOUNTER — Other Ambulatory Visit: Payer: Self-pay

## 2021-12-09 ENCOUNTER — Telehealth: Payer: Self-pay

## 2021-12-09 DIAGNOSIS — E1165 Type 2 diabetes mellitus with hyperglycemia: Secondary | ICD-10-CM

## 2021-12-09 MED ORDER — INSULIN GLARGINE 100 UNIT/ML SOLOSTAR PEN: 30.0000 [IU] | PEN_INJECTOR | Freq: Every day | SUBCUTANEOUS | 0 refills | Status: DC

## 2021-12-09 NOTE — Telephone Encounter (Signed)
error 

## 2021-12-09 NOTE — Telephone Encounter (Signed)
Called patient and let her know that I have sent in her prescription to the requested pharmacy. Patient verbalized an understanding.

## 2021-12-09 NOTE — Telephone Encounter (Signed)
Patient needs a refill on her Lantus. 30 units a day. [facility]

## 2021-12-10 ENCOUNTER — Inpatient Hospital Stay (HOSPITAL_COMMUNITY): Payer: Medicaid Other | Admitting: Hematology

## 2021-12-10 ENCOUNTER — Other Ambulatory Visit: Payer: Self-pay

## 2021-12-10 ENCOUNTER — Encounter: Payer: Medicaid Other | Attending: Nurse Practitioner | Admitting: Nutrition

## 2021-12-10 ENCOUNTER — Encounter: Payer: Self-pay | Admitting: Nutrition

## 2021-12-10 VITALS — Ht 63.5 in | Wt 195.0 lb

## 2021-12-10 VITALS — BP 138/83 | HR 81 | Temp 99.9°F | Resp 18 | Ht 63.5 in | Wt 195.1 lb

## 2021-12-10 DIAGNOSIS — D869 Sarcoidosis, unspecified: Secondary | ICD-10-CM

## 2021-12-10 DIAGNOSIS — D849 Immunodeficiency, unspecified: Secondary | ICD-10-CM | POA: Diagnosis present

## 2021-12-10 DIAGNOSIS — E1165 Type 2 diabetes mellitus with hyperglycemia: Secondary | ICD-10-CM | POA: Diagnosis present

## 2021-12-10 DIAGNOSIS — I10 Essential (primary) hypertension: Secondary | ICD-10-CM | POA: Diagnosis not present

## 2021-12-10 DIAGNOSIS — R519 Headache, unspecified: Secondary | ICD-10-CM | POA: Diagnosis not present

## 2021-12-10 DIAGNOSIS — R110 Nausea: Secondary | ICD-10-CM | POA: Diagnosis not present

## 2021-12-10 DIAGNOSIS — Z800 Family history of malignant neoplasm of digestive organs: Secondary | ICD-10-CM | POA: Diagnosis not present

## 2021-12-10 DIAGNOSIS — C7951 Secondary malignant neoplasm of bone: Secondary | ICD-10-CM | POA: Diagnosis not present

## 2021-12-10 DIAGNOSIS — R591 Generalized enlarged lymph nodes: Secondary | ICD-10-CM | POA: Diagnosis not present

## 2021-12-10 DIAGNOSIS — M25552 Pain in left hip: Secondary | ICD-10-CM | POA: Diagnosis not present

## 2021-12-10 DIAGNOSIS — Z90710 Acquired absence of both cervix and uterus: Secondary | ICD-10-CM | POA: Diagnosis not present

## 2021-12-10 MED ORDER — NOVOLOG FLEXPEN 100 UNIT/ML ~~LOC~~ SOPN
10.0000 [IU] | PEN_INJECTOR | Freq: Three times a day (TID) | SUBCUTANEOUS | 1 refills | Status: DC
Start: 2021-12-10 — End: 2022-02-25

## 2021-12-10 NOTE — Patient Instructions (Addendum)
[place] at [facility] Discharge Instructions   You were seen and examined today by Dr. Delton Coombes.  He reviewed the results of your lab work which is normal/stable.  Continue to follow with Dr. Loanne Drilling.   We will continue to monitor your blood work.  We will see you back in 8 months.    Thank you for choosing [place] at [facility] to provide your oncology and hematology care.  To afford each patient quality time with our provider, please arrive at least 15 minutes before your scheduled appointment time.   If you have a lab appointment with the [facility] please come in thru the Main Entrance and check in at the main information desk.  You need to re-schedule your appointment should you arrive 10 or more minutes late.  We strive to give you quality time with our providers, and arriving late affects you and other patients whose appointments are after yours.  Also, if you no show three or more times for appointments you may be dismissed from the clinic at the providers discretion.     Again, thank you for choosing [facility].  Our hope is that these requests will decrease the amount of time that you wait before being seen by our physicians.       _____________________________________________________________  Should you have questions after your visit to [facility], please contact our office at [phone] and follow the prompts.  Our office hours are 8:00 a.m. and 4:30 p.m. Monday - Friday.  Please note that voicemails left after 4:00 p.m. may not be returned until the following business day.  We are closed weekends and major holidays.  You do have access to a nurse 24-7, just call the main number to the clinic [phone] and do not press any options, hold on the line and a nurse will answer the phone.    For prescription refill requests, have your pharmacy contact our office and allow 72 hours.    Due to  Covid, you will need to wear a mask upon entering the hospital. If you do not have a mask, a mask will be given to you at the Main Entrance upon arrival. For doctor visits, patients may have 1 support person age 34 or older with them. For treatment visits, patients can not have anyone with them due to social distancing guidelines and our immunocompromised population.

## 2021-12-10 NOTE — Telephone Encounter (Signed)
Pt said she meant she needed a refill on Novolog. She does not need lantus

## 2021-12-10 NOTE — Telephone Encounter (Signed)
Novolog has been sent to the requested pharmacy.

## 2021-12-10 NOTE — Progress Notes (Signed)
Medical Nutrition Therapy  Appointment Start time:  2841  Appointment End time:  41  Primary concerns today: DM Type 2  Referral diagnosis: E11.8 Preferred learning style. No preference  Learning readiness: Contemplating    NUTRITION ASSESSMENT  Frustrated she is gaining weight and trying to eat better. Doesn't like being on insulin..  Is going through chemo for sarcoidosis. Has been on steroids and is tapering off of them. Has hyperlipidemia.  Semglee 30 units  at night. Novolog 10 units with meals plus sliding scale. Sees Rayetta Pigg FNP, endocrinology. Gets tired often and trying to eat as best she can when she feels well enough to eat.   Anthropometrics  Wt Readings from Last 3 Encounters:  12/10/21 195 lb (88.5 kg)  12/10/21 195 lb 1.7 oz (88.5 kg)  11/26/21 193 lb (87.5 kg)   Ht Readings from Last 3 Encounters:  12/10/21 5' 3.5" (1.613 m)  12/10/21 5' 3.5" (1.613 m)  11/26/21 5' 2.5" (1.588 m)   Body mass index is 34 kg/m. @BMIFA @ Facility age limit for growth percentiles is 20 years. Facility age limit for growth percentiles is 20 years.    Clinical Medical Hx: See chart Medications: Semglee 30 units at night, Novolog with meals 10 units with sliding scale, Metformin 850 mg BID. Labs:  Wt Readings from Last 3 Encounters:  12/10/21 195 lb (88.5 kg)  12/10/21 195 lb 1.7 oz (88.5 kg)  11/26/21 193 lb (87.5 kg)   Ht Readings from Last 3 Encounters:  12/10/21 5' 3.5" (1.613 m)  12/10/21 5' 3.5" (1.613 m)  11/26/21 5' 2.5" (1.588 m)   Body mass index is 34 kg/m. @BMIFA @ Facility age limit for growth percentiles is 20 years. Facility age limit for growth percentiles is 20 years.  Lab Results  Component Value Date   HGBA1C 7.9 (H) 09/25/2021   CMP Latest Ref Rng & Units 12/03/2021 11/18/2021 10/19/2021  Glucose 70 - 99 mg/dL 140(H) 199(H) 239(H)  BUN 6 - 20 mg/dL 14 19 15   Creatinine 0.44 - 1.00 mg/dL 0.62 0.76 0.63  Sodium 135 - 145 mmol/L 137 136  140  Potassium 3.5 - 5.1 mmol/L 3.5 4.1 3.7  Chloride 98 - 111 mmol/L 104 100 103  CO2 22 - 32 mmol/L 25 21 28   Calcium 8.9 - 10.3 mg/dL 8.6(L) 9.2 9.1  Total Protein 6.5 - 8.1 g/dL 5.9(L) 6.1 -  Total Bilirubin 0.3 - 1.2 mg/dL 0.6 0.3 -  Alkaline Phos 38 - 126 U/L 47 60 -  AST 15 - 41 U/L 19 16 -  ALT 0 - 44 U/L 30 29 -   Lipid Panel     Component Value Date/Time   CHOL 258 (H) 09/25/2021 1134   TRIG 169 (H) 09/25/2021 1134   HDL 59 09/25/2021 1134   CHOLHDL 4.4 09/25/2021 1134   LDLCALC 168 (H) 09/25/2021 1134   LABVLDL 31 09/25/2021 1134    Notable Signs/Symptoms: Increased thirst, frequent urination, fatigue  Lifestyle & Dietary Hx Has sarcoidosis and getting treatment for that. Lives with her husand.  Estimated daily fluid intake: 16 oz, 2 coke zeros Supplements:  Sleep: 3-4 hours. Stress / self-care: ilnness Current average weekly physical activity: ADL  24-Hr Dietary Recall First Meal:JImmy dean breakfast bowl Skips lunch Dinner: 6pm Breakfast bowl, water Trail mix.   Estimated Energy Needs Calories: 1500 Carbohydrate: 170g Protein: 1122g Fat: 42g   NUTRITION DIAGNOSIS  NB-1.1 Food and nutrition-related knowledge deficit As related to Diabetes Type 2.  As evidenced  by A1C 7.7%.   NUTRITION INTERVENTION  Nutrition education (E-1) on the following topics:  Nutrition and Diabetes education provided on My Plate, CHO counting, meal planning, portion sizes, timing of meals, avoiding snacks between meals unless having a low blood sugar, target ranges for A1C and blood sugars, signs/symptoms and treatment of hyper/hypoglycemia, monitoring blood sugars, taking medications as prescribed, benefits of exercising 30 minutes per day and prevention of complications of DM.  Lifestyle Medicine - Whole Food, Plant Predominant Nutrition is highly recommended: Eat Plenty of vegetables, Mushrooms, fruits, Legumes, Whole Grains, Nuts, seeds in lieu of processed meats,  processed snacks/pastries red meat, poultry, eggs.    -It is better to avoid simple carbohydrates including: Cakes, Sweet Desserts, Ice Cream, Soda (diet and regular), Sweet Tea, Candies, Chips, Cookies, Store Bought Juices, Alcohol in Excess of  1-2 drinks a day, Lemonade,  Artificial Sweeteners, Doughnuts, Coffee Creamers, "Sugar-free" Products, etc, etc.  This is not a complete list.....  Exercise: If you are able: 30 -60 minutes a day ,4 days a week, or 150 minutes a week.  The longer the better.  Combine stretch, strength, and aerobic activities.  If you were told in the past that you have high risk for cardiovascular diseases, you may seek evaluation by your heart doctor prior to initiating moderate to intense exercise programs.  Handouts Provided Include  Livestyle medicine meal plan Lifestyle nutrition Meal Plan Card  Learning Style & Readiness for Change Teaching method utilized: Visual & Auditory  Demonstrated degree of understanding via: Teach Back  Barriers to learning/adherence to lifestyle change: chemo treatments  Goals Established by Pt Eat three meals-plant based foods per day Eat meals on time Drink only water Avoid snacks Don't eat after 7 pm. Get A1C down to 7% or less Avoid hypoglcyemia.   MONITORING & EVALUATION Dietary intake, weekly physical activity, and blood sugars and weight in 1 month.  Next Steps  Patient is to work on meal planning, portion control and exercise.Marland Kitchen

## 2021-12-10 NOTE — Progress Notes (Signed)
[place], [place] [address]   CLINIC:  Medical Oncology/Hematology  PCP:  Coral Spikes, DO [address] [place] [address] [phone]   REASON FOR VISIT:  Follow-up for multiple bone lesions  PRIOR THERAPY: none  NGS Results: not done  CURRENT THERAPY: surveillance  BRIEF ONCOLOGIC HISTORY:  Oncology History   No history exists.    CANCER STAGING:  Cancer Staging  No matching staging information was found for the patient.  INTERVAL HISTORY:  [name], a 56 y.o. female, returns for routine follow-up of her multiple bone lesions. Asti was last seen on 07/28/2021.   Today she reports feeling fair. She is taking methotrexate and tolerating it well. She reports continued pain in her pelvis and swelling in her face and neck, none of which has improved with prednisone. She continues to have constant pain on the left side of her head.   REVIEW OF SYSTEMS:  Review of Systems  Constitutional:  Positive for appetite change and fatigue.  Respiratory:  Positive for shortness of breath.   Cardiovascular:  Positive for chest pain.  Gastrointestinal:  Positive for diarrhea and nausea.  Genitourinary:  Positive for dysuria and pelvic pain (L side).   Neurological:  Positive for dizziness, headaches and numbness (L foot).  All other systems reviewed and are negative.  PAST MEDICAL/SURGICAL HISTORY:  Past Medical History:  Diagnosis Date   Complication of anesthesia    woke up during endoscopy and colonoscopy   Depression    Hemorrhoids    History of esophageal stricture    2011  peptic w/  dilatation   History of gastritis    2011   History of melanoma excision    2015  left leg/   12-09-2015 back    Hypertension    Microhematuria    Migraines    Nephrolithiasis    left side non-obstructive    Right ureteral stone    Sarcoidosis 07/24/2021   Urgency of urination    Wears contact lenses    Past Surgical  History:  Procedure Laterality Date   ABDOMINAL HYSTERECTOMY     BIOPSY  11/16/2021   Procedure: BIOPSY;  Surgeon: Eloise Harman, DO;  [facility]: [facility];  Service: Endoscopy;;   CARPAL TUNNEL RELEASE Right 01-05- 2015   COLONOSCOPY N/A 08/27/2015   Procedure: COLONOSCOPY;  Surgeon: Danie Binder, MD;  Location: [facility];  Service: Endoscopy;  Laterality: N/A;  0830   COLONOSCOPY WITH PROPOFOL N/A 11/16/2021   Procedure: COLONOSCOPY WITH PROPOFOL;  Surgeon: Eloise Harman, DO;  Location: [facility];  Service: Endoscopy;  Laterality: N/A;  2:00pm   CYST EXCISION  2012     right hand   CYSTOSCOPY W/ URETERAL STENT PLACEMENT Right 12/22/2015   Procedure: CYSTOSCOPY WITH RETROGRADE PYELOGRAM/URETERAL STENT PLACEMENT;  Surgeon: Nickie Retort, MD;  Location: [facility];  Service: Urology;  Laterality: Right;   CYSTOSCOPY WITH RETROGRADE PYELOGRAM, URETEROSCOPY AND STENT PLACEMENT Right 01/05/2016   Procedure: CYSTOSCOPY WITH RETROGRADE PYELOGRAM, URETEROSCOPY AND STENT REPLACEMENT;  Surgeon: Nickie Retort, MD;  Location: [facility];  Service: Urology;  Laterality: Right;   DILATION AND CURETTAGE OF UTERUS  03-12-2009   w/  Suction   double balloon enteroscopy     Dr. Arsenio Loader at [facility]: no erosions, no evidence of Crohn's disease, path without Crohn's.    EGD with push enteroscopy  02-12-2010  patent distal peptic stricture with diffuse antral erythema, normal D1 and D2    ESOPHAGOGASTRODUODENOSCOPY (EGD) WITH PROPOFOL N/A 11/16/2021   Procedure: ESOPHAGOGASTRODUODENOSCOPY (EGD) WITH PROPOFOL;  Surgeon: Eloise Harman, DO;  [facility];  Service: Endoscopy;  Laterality: N/A;   LAPAROSCOPIC ASSISTED VAGINAL HYSTERECTOMY  10-26-2014   w/  Bilateral Salpingoophorectomy   STONE EXTRACTION WITH BASKET Right 01/05/2016   Procedure: STONE EXTRACTION WITH BASKET;  Surgeon: Nickie Retort, MD;  Location: [facility];  Service:  Urology;  Laterality: Right;   TONSILLECTOMY  1998  approx    SOCIAL HISTORY:  Social History   Socioeconomic History   Marital status: Single    Spouse name: Not on file   Number of children: Not on file   Years of education: Not on file   Highest education level: Not on file  Occupational History   Not on file  Tobacco Use   Smoking status: Never   Smokeless tobacco: Never  Vaping Use   Vaping Use: Never used  Substance and Sexual Activity   Alcohol use: Yes    Comment: occassionally   Drug use: No   Sexual activity: Not on file  Other Topics Concern   Not on file  Social History Narrative   Not on file   Social Determinants of Health   Financial Resource Strain: Low Risk    Difficulty of Paying Living Expenses: Not hard at all  Food Insecurity: No Food Insecurity   Worried About Estate manager/land agent of Food in the Last Year: Never true   [place] in the Last Year: Never true  Transportation Needs: No Transportation Needs   Lack of Transportation (Medical): No   Lack of Transportation (Non-Medical): No  Physical Activity: Inactive   Days of Exercise per Week: 0 days   Minutes of Exercise per Session: 0 min  Stress: Stress Concern Present   Feeling of Stress : Very much  Social Connections: Socially Isolated   Frequency of Communication with Friends and Family: More than three times a week   Frequency of Social Gatherings with Friends and Family: More than three times a week   Attends Religious Services: Never   Marine scientist or Organizations: No   Attends Music therapist: Never   Marital Status: Never married  Human resources officer Violence: Not At Risk   Fear of Current or Ex-Partner: No   Emotionally Abused: No   Physically Abused: No   Sexually Abused: No    FAMILY HISTORY:  Family History  Problem Relation Age of Onset   Hypertension Mother    Thyroid disease Mother    Diabetes Mother    Hyperlipidemia Mother    Stroke Mother     Hyperlipidemia Father    Hypertension Father    Heart attack Father    Heart failure Father    Colon cancer Neg Hx     CURRENT MEDICATIONS:  Current Outpatient Medications  Medication Sig Dispense Refill   acetaminophen (TYLENOL) 500 MG tablet Take 1,000 mg by mouth every 6 (six) hours as needed for moderate pain.     albuterol (VENTOLIN HFA) 108 (90 Base) MCG/ACT inhaler Inhale 2 puffs into the lungs every 6 (six) hours as needed for wheezing or shortness of breath. 8 g 6   busPIRone (BUSPAR) 5 MG tablet TAKE 1 TABLET BY MOUTH THREE TIMES DAILY (Patient taking differently: Take 5 mg by mouth 3 (three) times daily as needed (anxiety).) 45  tablet 2   calcium carbonate (TUMS - DOSED IN MG ELEMENTAL CALCIUM) 500 MG chewable tablet Chew 1,500 mg by mouth 2 (two) times daily as needed for indigestion or heartburn.     Carboxymethylcellul-Glycerin (LUBRICATING EYE DROPS OP) Place 1 drop into both eyes daily as needed (dry eyes).     Chromium 1000 MCG TABS Take 1,000 mcg by mouth daily.     CINNAMON PO Take 2,000 mg by mouth daily.     Continuous Blood Gluc Receiver (FREESTYLE LIBRE 2 READER) DEVI Use as directed to check blood sugars. 1 each 0   Continuous Blood Gluc Sensor (FREESTYLE LIBRE 2 SENSOR) MISC Use sensor as directed. Change every 10-14 days. 4 each 3   Cyanocobalamin (B-12) 2000 MCG TABS Take 2,000 mcg by mouth daily.     doxycycline (VIBRA-TABS) 100 MG tablet Take 1 tablet (100 mg total) by mouth 2 (two) times daily. 20 tablet 0   EPINEPHrine 0.3 mg/0.3 mL IJ SOAJ injection Inject 0.3 mg into the muscle as needed for anaphylaxis. 1 each 0   folic acid (FOLVITE) 1 MG tablet Take 1 tablet daily for 14 days, then increase to 2 tablets daily thereafter. 90 tablet 1   hydrochlorothiazide (HYDRODIURIL) 25 MG tablet Take 1 tablet (25 mg total) by mouth daily. 90 tablet 1   HYDROcodone-acetaminophen (NORCO/VICODIN) 5-325 MG tablet Take 1 tablet by mouth every 8 (eight) hours as needed for  moderate pain. 30 tablet 0   insulin aspart (NOVOLOG FLEXPEN) 100 UNIT/ML FlexPen Inject 10-16 Units into the skin 3 (three) times daily with meals. 6 units with largest meal of the day. 5 units with breakfast and 5 units with supper 3 mL 1   insulin glargine (LANTUS) 100 UNIT/ML Solostar Pen Inject 30 Units into the skin at bedtime. 45 mL 0   Insulin Pen Needle 32G X 4 MM MISC Use as directed to administer insulin. 100 each 1   losartan (COZAAR) 100 MG tablet TAKE 1 TABLET BY MOUTH ONCE DAILY. 90 tablet 1   meclizine (ANTIVERT) 25 MG tablet Take 25 mg by mouth 3 (three) times daily as needed for dizziness.     metFORMIN (GLUCOPHAGE) 850 MG tablet Take 1 tablet (850 mg total) by mouth 2 (two) times daily with a meal. 180 tablet 1   methotrexate (RHEUMATREX) 2.5 MG tablet Take 4 tablets weekly for 2 weeks. Then 6 tablets weekly for 2 weeks. Then 8 tablets weekly thereafter. 52 tablet 0   omeprazole (PRILOSEC) 40 MG capsule Take 1 capsule (40 mg total) by mouth 2 (two) times daily before a meal. 60 capsule 5   PARoxetine (PAXIL) 40 MG tablet Take 1 tablet (40 mg total) by mouth every morning. 90 tablet 1   predniSONE (DELTASONE) 10 MG tablet Take 4 tablets (40 mg total) by mouth daily with breakfast. 360 tablet 0   Probiotic Product (PROBIOTIC PO) Take 1 capsule by mouth daily.     rizatriptan (MAXALT-MLT) 10 MG disintegrating tablet Take 1 tablet (10 mg total) by mouth as needed for migraine. May repeat in 2 hours if needed. Max 2 per 24 hours 10 tablet 1   SEMGLEE, YFGN, 100 UNIT/ML Pen SMARTSIG:35 Unit(s) SUB-Q Every Night     traZODone (DESYREL) 100 MG tablet Take 1 tablet (100 mg total) by mouth at bedtime as needed for sleep. 90 tablet 1   valACYclovir (VALTREX) 500 MG tablet Take 500 mg by mouth 2 (two) times daily as needed (outbreaks).  ondansetron (ZOFRAN-ODT) 4 MG disintegrating tablet Take 4 mg by mouth every 8 (eight) hours as needed for nausea or vomiting. (Patient not taking:  Reported on 12/10/2021)     prochlorperazine (COMPAZINE) 10 MG tablet Take 1 tablet (10 mg total) by mouth every 6 (six) hours as needed for nausea or vomiting. (Patient not taking: Reported on 12/10/2021) 30 tablet 2   No current facility-administered medications for this visit.    ALLERGIES:  Allergies  Allergen Reactions   Topamax [Topiramate] Other (See Comments)    Developed kidney stones   Fluorouracil Swelling    Burning skin   Sulfa Antibiotics Other (See Comments)    Burning sensation, flushing to skin    PHYSICAL EXAM:  Performance status (ECOG): 0 - Asymptomatic  Vitals:   12/10/21 1155  BP: 138/83  Pulse: 81  Resp: 18  Temp: 99.9 F (37.7 C)  SpO2: 96%   Wt Readings from Last 3 Encounters:  12/10/21 195 lb 1.7 oz (88.5 kg)  11/26/21 193 lb (87.5 kg)  11/11/21 195 lb 3.2 oz (88.5 kg)   Physical Exam Vitals reviewed.  Constitutional:      Appearance: Normal appearance.  Cardiovascular:     Rate and Rhythm: Normal rate and regular rhythm.     Pulses: Normal pulses.     Heart sounds: Normal heart sounds.  Pulmonary:     Effort: Pulmonary effort is normal.     Breath sounds: Normal breath sounds.  Neurological:     General: No focal deficit present.     Mental Status: She is alert and oriented to person, place, and time.  Psychiatric:        Mood and Affect: Mood normal.        Behavior: Behavior normal.     LABORATORY DATA:  I have reviewed the labs as listed.  CBC Latest Ref Rng & Units 12/03/2021 11/18/2021 10/19/2021  WBC 4.0 - 10.5 K/uL 11.4(H) 11.2(H) 10.2  Hemoglobin 12.0 - 15.0 g/dL 13.1 13.4 13.4  Hematocrit 36.0 - 46.0 % 38.9 38.9 39.6  Platelets 150 - 400 K/uL 279 322 294   CMP Latest Ref Rng & Units 12/03/2021 11/18/2021 10/19/2021  Glucose 70 - 99 mg/dL 140(H) 199(H) 239(H)  BUN 6 - 20 mg/dL 14 19 15   Creatinine 0.44 - 1.00 mg/dL 0.62 0.76 0.63  Sodium 135 - 145 mmol/L 137 136 140  Potassium 3.5 - 5.1 mmol/L 3.5 4.1 3.7  Chloride 98 - 111  mmol/L 104 100 103  CO2 22 - 32 mmol/L 25 21 28   Calcium 8.9 - 10.3 mg/dL 8.6(L) 9.2 9.1  Total Protein 6.5 - 8.1 g/dL 5.9(L) 6.1 -  Total Bilirubin 0.3 - 1.2 mg/dL 0.6 0.3 -  Alkaline Phos 38 - 126 U/L 47 60 -  AST 15 - 41 U/L 19 16 -  ALT 0 - 44 U/L 30 29 -    DIAGNOSTIC IMAGING:  I have independently reviewed the scans and discussed with the patient. No results found.   ASSESSMENT:  1.  Multiple bone lesions: - Presentation with left hip pain since February 2022. - Left hip mass noticed in May 2022, tender and painful. - Low back pain started since 25 May 2021. - Ultrasound of the left upper thigh soft tissue on 04/08/2021 was negative with differential including large occult subcutaneous lipoma. - MRI of the left femur with and without contrast on 05/14/2021-no soft tissue mass, fluid collection or hematoma at the site of clinical concern in the left  lateral thigh.  Indeterminate bone lesion in the S1 vertebral body, right sacrum and left posterior intertrochanteric region. - MRI of the pelvis on 06/24/2021 showed multiple bone lesions in the pelvis, lower lumbar spine consistent with metastatic disease. 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 Juel Burrow office. - She recently had a basal cell carcinoma of the nose and left temporal region resected. -PET scan on 07/10/2021 showed hypermetabolic lesions in the right iliac bone, right sacral ala, left frontal area.  Right lower paratracheal lymph node 10 mm, SUV 4.5.  Left lower paratracheal lymph node 10 mm SUV 5.5.  Symmetric moderate activity in the hilar lymph nodes difficult to assess on noncontrast CT.  No lung nodules. - Biopsy of the right posterior iliac bone on 07/21/2021 showed noncaseating granulomatous inflammation with no evidence of malignancy.  CD68 highlights the presence of granulomas. - Biopsy findings when taken together  with PET scan are suggestive of non pulmonary sarcoidosis.  2.  Social/family history: - She lives at home with her son.  Mom lives close by. 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  She is non-smoker. - Maternal aunt had throat cancer.  Another maternal aunt had bone cancer.  Another maternal aunt had pancreatic cancer.  Niece had ovarian cancer.  Paternal uncle had leukemia.   PLAN:  1.  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 Loanne Drilling and was started on prednisone 40 mg daily since October 2022. - She has also started methotrexate 10 mg weekly on 12/07/2021 and is being titrated up. - She has some steroid induced side effects.  She also has an appointment at [facility] sarcoidosis clinic coming up. - Reviewed labs today which showed mildly elevated white count 11.4, lymphocytosis.  LFTs are normal.  Fasting glucose is elevated at 140. - Continue sarcoidosis treatments as per expert recommendations.  We will evaluate her in 8 months with repeat labs.   2.  Lower back and left hip pain: - Continue hydrocodone as needed.  3.  Left frontal headaches: - MRI of the brain on 07/08/2021 showed 2 cm enhancing lesion in the left frontal bone with no intracranial metastatic disease.  4.  Intermittent nausea: - Continue Compazine as needed.   Orders placed this encounter:  No orders of the defined types were placed in this encounter.    Derek Jack, MD [place] [phone]   I, Thana Ates, am acting as a scribe for Dr. Derek Jack.  I, Derek Jack MD, have reviewed the above documentation for accuracy and completeness, and I agree with the above.

## 2021-12-11 ENCOUNTER — Ambulatory Visit: Payer: Medicaid Other | Admitting: Family Medicine

## 2021-12-14 ENCOUNTER — Telehealth: Payer: Self-pay | Admitting: Nurse Practitioner

## 2021-12-14 NOTE — Telephone Encounter (Signed)
Called and spoke to [employer] and the girl working with insurance stated that she may have gotten insurance to approve the new dosage. They still had the old dosage of 5 units of Novolog per meal from December of 2022.

## 2021-12-14 NOTE — Telephone Encounter (Signed)
[facility] called about pt Novolog and states the insurance is stating they will not cover her new rx for the 2/16 because her previous rx should last her until 2/25. Pt is telling pharmacy she was advised to increase her insulin and that is why she is out. They are needing a call back

## 2021-12-28 ENCOUNTER — Encounter: Payer: Self-pay | Admitting: Nutrition

## 2021-12-28 ENCOUNTER — Ambulatory Visit: Payer: Medicaid Other | Admitting: Pulmonary Disease

## 2021-12-28 NOTE — Patient Instructions (Signed)
Goals Established by Pt Eat three meals-plant based foods per day Eat meals on time Drink only water Avoid snacks Don't eat after 7 pm. Get A1C down to 7% or less Avoid hypoglcyemia.

## 2021-12-31 ENCOUNTER — Ambulatory Visit: Payer: Medicaid Other | Admitting: Pulmonary Disease

## 2021-12-31 ENCOUNTER — Other Ambulatory Visit: Payer: Self-pay

## 2021-12-31 ENCOUNTER — Encounter: Payer: Self-pay | Admitting: Pulmonary Disease

## 2021-12-31 VITALS — BP 140/80 | HR 73 | Ht 62.5 in | Wt 204.8 lb

## 2021-12-31 DIAGNOSIS — D869 Sarcoidosis, unspecified: Secondary | ICD-10-CM | POA: Diagnosis not present

## 2021-12-31 DIAGNOSIS — D849 Immunodeficiency, unspecified: Secondary | ICD-10-CM | POA: Diagnosis not present

## 2021-12-31 DIAGNOSIS — Z79899 Other long term (current) drug therapy: Secondary | ICD-10-CM | POA: Diagnosis not present

## 2021-12-31 LAB — CBC WITH DIFFERENTIAL/PLATELET
Basophils Absolute: 0.1 10*3/uL (ref 0.0–0.1)
Basophils Relative: 0.4 % (ref 0.0–3.0)
Eosinophils Absolute: 0 10*3/uL (ref 0.0–0.7)
Eosinophils Relative: 0 % (ref 0.0–5.0)
HCT: 37.3 % (ref 36.0–46.0)
Hemoglobin: 12.5 g/dL (ref 12.0–15.0)
Lymphocytes Relative: 10.8 % — ABNORMAL LOW (ref 12.0–46.0)
Lymphs Abs: 1.4 10*3/uL (ref 0.7–4.0)
MCHC: 33.4 g/dL (ref 30.0–36.0)
MCV: 88.2 fl (ref 78.0–100.0)
Monocytes Absolute: 0.3 10*3/uL (ref 0.1–1.0)
Monocytes Relative: 2.5 % — ABNORMAL LOW (ref 3.0–12.0)
Neutro Abs: 11.1 10*3/uL — ABNORMAL HIGH (ref 1.4–7.7)
Neutrophils Relative %: 86.3 % — ABNORMAL HIGH (ref 43.0–77.0)
Platelets: 293 10*3/uL (ref 150.0–400.0)
RBC: 4.22 Mil/uL (ref 3.87–5.11)
RDW: 13.9 % (ref 11.5–15.5)
WBC: 12.8 10*3/uL — ABNORMAL HIGH (ref 4.0–10.5)

## 2021-12-31 LAB — COMPREHENSIVE METABOLIC PANEL
ALT: 35 U/L (ref 0–35)
AST: 17 U/L (ref 0–37)
Albumin: 3.9 g/dL (ref 3.5–5.2)
Alkaline Phosphatase: 46 U/L (ref 39–117)
BUN: 15 mg/dL (ref 6–23)
CO2: 26 mEq/L (ref 19–32)
Calcium: 8.9 mg/dL (ref 8.4–10.5)
Chloride: 103 mEq/L (ref 96–112)
Creatinine, Ser: 0.67 mg/dL (ref 0.40–1.20)
GFR: 99.6 mL/min (ref >60.00)
Glucose, Bld: 176 mg/dL — ABNORMAL HIGH (ref 70–99)
Potassium: 3.9 mEq/L (ref 3.5–5.1)
Sodium: 138 mEq/L (ref 135–145)
Total Bilirubin: 0.5 mg/dL (ref 0.2–1.2)
Total Protein: 6 g/dL (ref 6.0–8.3)

## 2021-12-31 MED ORDER — PREDNISONE 5 MG PO TABS: ORAL_TABLET | ORAL | 0 refills | Status: AC

## 2021-12-31 NOTE — Patient Instructions (Addendum)
?  Pulmonary sarcoidosis with bone involvement ?--12/07/21 Methotrexate initiated. Next week starting 20 mg weekly with folic acid 2 mg daily. On prednisone 30 mg daily ?--Plan to wean prednisone to 20 mg for two weeks, 10 mg for two weeks, followed by 5 mg for two weeks and then off ?--ORDER CBC and CMET today ? ?Osteoporosis prevention in setting of chronic steroid use ?--Recommend Calcium 1000-1200 mg daily and vitamin D 600-800 units daily through diet or supplements ? ?Follow-up with me in 5 weeks. Will order CBC and CMET for future visit too ? ?

## 2021-12-31 NOTE — Progress Notes (Unsigned)
Subjective:   PATIENT ID: Courtney Hale GENDER: female DOB: 1966-01-30, MRN: [id number]   HPI  Chief Complaint  Patient presents with   Follow-up    Methotrexate f/u, feeling tired weak   Reason for Visit: Follow-up  [name] is a 56 year old female with history of melanoma s/p skin resections with DM2 who presents for follow-up sarcoidosis.  Synopsis: She was initially referred to [facility] shortness of breath with exertion and at rest.  She was seen by Dr. Melvyn Novas for sarcoid. She has been followed by Dr. Delton Coombes in hematology for multiple bone lesions after presenting for left hip pain since February 2022. PET demonstrated hypermetabolic mediastinal lymphadenopathy and right iliac, right sacral ala and anterior left frontal bone lesions. She underwent bone biopsy of right iliac bone lesion on 07/21/2021 which returned with noncaseating granulomatous inflammation. Dr. Melvyn Novas started patient on 20 mg prednisone and transferred to my care in November 2022.  08/27/21 Yesterday she presented to the ED for acute on chronic atypical chest pain that was worsening.  EKG negative for ischemia.  ED PA evaluated and had low suspicion for ACS.  Started on PPI for reflux and referred to GI. Since starting the steroids she reports she is unsure if it has been effective. She has shortness of breath with panic attacks. Sometimes unable to take a deep breath. Occasional dry cough.  10/01/21 She reports that she is unclear if her current steroid dose is helping. She has used her inhaler for shortness of breath and uses albuterol 2-3 times a day for two days a week. She continues to have left hip pain and left head pain. Right side doesn't bother her. She reports seeing out of her left eye with blurriness and double vision at times which she attributes to facial swelling and pain.   11/10/20 She saw her Ophthalmologist this morning. Reports no sarcoid but diagnosed with bilateral glaucoma.  Bone pain in her left head and hip and has not improved despite steroids. She continues to have shortness of breath and using albuterol as needed 2-3 times a week. She was recently treated on doxycycline for productive cough.  12/31/21 Currently methotrexate and prednisone 30 mg  Past Medical History:  Diagnosis Date   Complication of anesthesia    woke up during endoscopy and colonoscopy   Depression    Hemorrhoids    History of esophageal stricture    2011  peptic w/  dilatation   History of gastritis    2011   History of melanoma excision    2015  left leg/   12-09-2015 back    Hypertension    Microhematuria    Migraines    Nephrolithiasis    left side non-obstructive    Right ureteral stone    Sarcoidosis 07/24/2021   Urgency of urination    Wears contact lenses      Family History  Problem Relation Age of Onset   Hypertension Mother    Thyroid disease Mother    Diabetes Mother    Hyperlipidemia Mother    Stroke Mother    Hyperlipidemia Father    Hypertension Father    Heart attack Father    Heart failure Father    Colon cancer Neg Hx      Social History   Occupational History   Not on file  Tobacco Use   Smoking status: Never   Smokeless tobacco: Never  Vaping Use   Vaping Use: Never used  Substance and  Sexual Activity   Alcohol use: Yes    Comment: occassionally   Drug use: No   Sexual activity: Not on file    Allergies  Allergen Reactions   Topamax [Topiramate] Other (See Comments)    Developed kidney stones   Fluorouracil Swelling    Burning skin   Sulfa Antibiotics Other (See Comments)    Burning sensation, flushing to skin     Outpatient Medications Prior to Visit  Medication Sig Dispense Refill   acetaminophen (TYLENOL) 500 MG tablet Take 1,000 mg by mouth every 6 (six) hours as needed for moderate pain.     albuterol (VENTOLIN HFA) 108 (90 Base) MCG/ACT inhaler Inhale 2 puffs into the lungs every 6 (six) hours as needed for wheezing or  shortness of breath. 8 g 6   busPIRone (BUSPAR) 5 MG tablet TAKE 1 TABLET BY MOUTH THREE TIMES DAILY (Patient taking differently: Take 5 mg by mouth 3 (three) times daily as needed (anxiety).) 45 tablet 2   calcium carbonate (TUMS - DOSED IN MG ELEMENTAL CALCIUM) 500 MG chewable tablet Chew 1,500 mg by mouth 2 (two) times daily as needed for indigestion or heartburn.     Carboxymethylcellul-Glycerin (LUBRICATING EYE DROPS OP) Place 1 drop into both eyes daily as needed (dry eyes).     Chromium 1000 MCG TABS Take 1,000 mcg by mouth daily.     CINNAMON PO Take 2,000 mg by mouth daily.     Continuous Blood Gluc Receiver (FREESTYLE LIBRE 2 READER) DEVI Use as directed to check blood sugars. 1 each 0   Continuous Blood Gluc Sensor (FREESTYLE LIBRE 2 SENSOR) MISC Use sensor as directed. Change every 10-14 days. 4 each 3   Cyanocobalamin (B-12) 2000 MCG TABS Take 2,000 mcg by mouth daily.     EPINEPHrine 0.3 mg/0.3 mL IJ SOAJ injection Inject 0.3 mg into the muscle as needed for anaphylaxis. 1 each 0   folic acid (FOLVITE) 1 MG tablet Take 1 tablet daily for 14 days, then increase to 2 tablets daily thereafter. 90 tablet 1   hydrochlorothiazide (HYDRODIURIL) 25 MG tablet Take 1 tablet (25 mg total) by mouth daily. 90 tablet 1   HYDROcodone-acetaminophen (NORCO/VICODIN) 5-325 MG tablet Take 1 tablet by mouth every 8 (eight) hours as needed for moderate pain. 30 tablet 0   Insulin Pen Needle 32G X 4 MM MISC Use as directed to administer insulin. 100 each 1   losartan (COZAAR) 100 MG tablet TAKE 1 TABLET BY MOUTH ONCE DAILY. 90 tablet 1   meclizine (ANTIVERT) 25 MG tablet Take 25 mg by mouth 3 (three) times daily as needed for dizziness.     metFORMIN (GLUCOPHAGE) 850 MG tablet Take 1 tablet (850 mg total) by mouth 2 (two) times daily with a meal. 180 tablet 1   methotrexate (RHEUMATREX) 2.5 MG tablet Take 4 tablets weekly for 2 weeks. Then 6 tablets weekly for 2 weeks. Then 8 tablets weekly thereafter. 52  tablet 0   omeprazole (PRILOSEC) 40 MG capsule Take 1 capsule (40 mg total) by mouth 2 (two) times daily before a meal. 60 capsule 5   ondansetron (ZOFRAN-ODT) 4 MG disintegrating tablet Take 4 mg by mouth every 8 (eight) hours as needed for nausea or vomiting.     PARoxetine (PAXIL) 40 MG tablet Take 1 tablet (40 mg total) by mouth every morning. 90 tablet 1   predniSONE (DELTASONE) 10 MG tablet Take 4 tablets (40 mg total) by mouth daily with breakfast. 360 tablet 0  Probiotic Product (PROBIOTIC PO) Take 1 capsule by mouth daily.     prochlorperazine (COMPAZINE) 10 MG tablet Take 1 tablet (10 mg total) by mouth every 6 (six) hours as needed for nausea or vomiting. 30 tablet 2   rizatriptan (MAXALT-MLT) 10 MG disintegrating tablet Take 1 tablet (10 mg total) by mouth as needed for migraine. May repeat in 2 hours if needed. Max 2 per 24 hours 10 tablet 1   SEMGLEE, YFGN, 100 UNIT/ML Pen SMARTSIG:35 Unit(s) SUB-Q Every Night     traZODone (DESYREL) 100 MG tablet Take 1 tablet (100 mg total) by mouth at bedtime as needed for sleep. 90 tablet 1   valACYclovir (VALTREX) 500 MG tablet Take 500 mg by mouth 2 (two) times daily as needed (outbreaks).     doxycycline (VIBRA-TABS) 100 MG tablet Take 1 tablet (100 mg total) by mouth 2 (two) times daily. 20 tablet 0   insulin aspart (NOVOLOG FLEXPEN) 100 UNIT/ML FlexPen Inject 10-16 Units into the skin 3 (three) times daily with meals. 30 mL 1   insulin glargine (LANTUS) 100 UNIT/ML Solostar Pen Inject 30 Units into the skin at bedtime. 45 mL 0   No facility-administered medications prior to visit.    ROS   Objective:   Vitals:   12/31/21 1032  BP: 140/80  Pulse: 73  SpO2: 95%  Weight: 204 lb 12.8 oz (92.9 kg)  Height: 5' 2.5" (1.588 m)  SpO2: 95 % O2 Device: None (Room air)  Physical Exam: General: Well-appearing, no acute distress HENT: [place], AT Eyes: EOMI, no scleral icterus Respiratory: Clear to auscultation bilaterally.  No crackles,  wheezing or rales Cardiovascular: RRR, -M/R/G, no JVD Extremities:-Edema,-tenderness Neuro: AAO x4, CNII-XII grossly intact Psych: Normal mood, normal affect  Data Reviewed:  Imaging: PET 1/84/03 - Hypermetabolic bilateral hilar and mediastinal lymph nodules, right ilac lesion, right sacral ala, right scapular spine, left scapular spine and left frontal bone  PFT: 09/30/21 FVC 2.56 (73%) FEV1 2.20 (81%) Ratio 86  TLC 71% DLCO 112% Interpretation: Mild restrictive defect with normal DLCO.  Labs: CMP Latest Ref Rng & Units 12/03/2021 11/18/2021 10/19/2021  Glucose 70 - 99 mg/dL 140(H) 199(H) 239(H)  BUN 6 - 20 mg/dL '14 19 15  ' Creatinine 0.44 - 1.00 mg/dL 0.62 0.76 0.63  Sodium 135 - 145 mmol/L 137 136 140  Potassium 3.5 - 5.1 mmol/L 3.5 4.1 3.7  Chloride 98 - 111 mmol/L 104 100 103  CO2 22 - 32 mmol/L '25 21 28  ' Calcium 8.9 - 10.3 mg/dL 8.6(L) 9.2 9.1  Total Protein 6.5 - 8.1 g/dL 5.9(L) 6.1 -  Total Bilirubin 0.3 - 1.2 mg/dL 0.6 0.3 -  Alkaline Phos 38 - 126 U/L 47 60 -  AST 15 - 41 U/L 19 16 -  ALT 0 - 44 U/L 30 29 -   CBC    Component Value Date/Time   WBC 11.4 (H) 12/03/2021 1123   RBC 4.27 12/03/2021 1123   HGB 13.1 12/03/2021 1123   HGB 13.4 11/18/2021 1026   HCT 38.9 12/03/2021 1123   HCT 38.9 11/18/2021 1026   PLT 279 12/03/2021 1123   PLT 322 11/18/2021 1026   MCV 91.1 12/03/2021 1123   MCV 87 11/18/2021 1026   MCH 30.7 12/03/2021 1123   MCHC 33.7 12/03/2021 1123   RDW 13.4 12/03/2021 1123   RDW 12.5 11/18/2021 1026   LYMPHSABS 4.8 (H) 12/03/2021 1123   LYMPHSABS 1.8 11/18/2021 1026   MONOABS 0.7 12/03/2021 1123   EOSABS  0.1 12/03/2021 1123   EOSABS 0.0 11/18/2021 1026   BASOSABS 0.1 12/03/2021 1123   BASOSABS 0.0 11/18/2021 1026   08/14/21 ACE 33 ESR  20    Assessment & Plan:   Discussion: 56 year old female with sarcoid, history of melanoma status post skin resections, diabetes type 2 who presents for follow-up of sarcoid with lung and bone  involvement.  We discussed the clinical course of sarcoid and management including serial PFTs, labs, eye exam, and EKG and chest imaging if indicated.  Currently on prednisone taper as we ramp up her methotrexate dosing for pulmonary sarcoid.  Recent ophtho exam with no eye involvement.   Pulmonary sarcoidosis with bone involvement - Dx in 06/2021 via right iliac bone biopsy - Immunosuppression with prednisone - Annual PFTs.  Last PFTs - 09/2021 - Recommend annual ophthalmology exam 11/10/21 - EKG reviewed. No evidence of conduction abnormalities.   Pulmonary sarcoid with restrictive defect Shortness of breath --CONTINUE Albuterol TWO puffs as needed for shortness of breath  Chronic immunosuppression/High risk medication use --08/14/21 Treated with prednisone 20 mg ~8 weeks --10/01/21 - today ~ 6 weeks of prednisone 40 mg --12/03/21 Methotrexate initiated. Starting 20 mg weekly with folic acid 2 mg daily. On prednisone 20 mg daily --Plan to wean prednisone to 10 mg in two weeks, followed by 5 mg for two weeks and then off --CONTINUE omeprazole 20 mg daily --ORDER CBC and CMET. Will call if abnormal  Bone sarcoid involvement --Immunosuppression as above  Osteoporosis prevention in setting of chronic steroid use --Recommend Calcium 1000-1200 mg daily and vitamin D 600-800 units daily through diet or supplements    Health Maintenance Immunization History  Administered Date(s) Administered   Influenza,inj,Quad PF,6+ Mos 09/09/2016, 08/29/2017, 09/27/2019, 10/30/2020, 08/14/2021   Influenza-Unspecified 08/22/2013, 07/22/2014, 08/08/2015   MMR 01/22/2011   PFIZER(Purple Top)SARS-COV-2 Vaccination 12/30/2019, 01/20/2020   Tdap 01/22/2011, 04/17/2014   CT Lung Screen - not indicated. Insufficient tobacco history  No orders of the defined types were placed in this encounter.  No orders of the defined types were placed in this encounter.  No follow-ups on file.  I have spent a total  time of 45-minutes on the day of the appointment reviewing prior documentation, coordinating care and discussing medical diagnosis and plan with the patient/family. Past medical history, allergies, medications were reviewed. Pertinent imaging, labs and tests included in this note have been reviewed and interpreted independently by me.  [place], MD [facility] 12/31/2021 10:50 AM  Office Number [phone]

## 2022-01-01 ENCOUNTER — Ambulatory Visit (INDEPENDENT_AMBULATORY_CARE_PROVIDER_SITE_OTHER): Payer: Medicaid Other | Admitting: Family Medicine

## 2022-01-01 ENCOUNTER — Encounter: Payer: Self-pay | Admitting: Family Medicine

## 2022-01-01 VITALS — BP 150/86 | HR 103 | Temp 98.3°F | Wt 201.2 lb

## 2022-01-01 DIAGNOSIS — J988 Other specified respiratory disorders: Secondary | ICD-10-CM | POA: Insufficient documentation

## 2022-01-01 DIAGNOSIS — D849 Immunodeficiency, unspecified: Secondary | ICD-10-CM

## 2022-01-01 LAB — POCT RAPID STREP A (OFFICE): Rapid Strep A Screen: NEGATIVE

## 2022-01-01 MED ORDER — AMOXICILLIN-POT CLAVULANATE 875-125 MG PO TABS: 1.0000 | ORAL_TABLET | Freq: Two times a day (BID) | ORAL | 0 refills | Status: DC

## 2022-01-01 NOTE — Assessment & Plan Note (Signed)
Given the fact that patient is immunocompromise, placing empirically on Augmentin to cover for bacterial respiratory infection. ?

## 2022-01-01 NOTE — Patient Instructions (Signed)
Lots of fluids and rest. ? ?Antibiotic as prescribed. ? ?Take care ? ?Dr. Lacinda Axon  ?

## 2022-01-01 NOTE — Progress Notes (Signed)
? ?Subjective:  ?Patient ID: Courtney Hale, female    DOB: 1966/07/13  Age: 56 y.o. MRN: [id number] ? ?CC: ?Chief Complaint  ?Patient presents with  ? Cough  ?  Cough, sore throat, headache, stuffy nose (at time runny nose)thick green mucus, possibly dehadrated(having cramps all over). Sore throat began last Saturday and thought it was improving but came back 3 days ago. Taking nyquil and dayquil and Sinus Tylenol   ? ? ?HPI: ? ?56 year old female with ongoing sarcoidosis and immunosuppression presents with respiratory symptoms. ? ?Reports cough, sore throat, headache, congestion.  Cough is productive of thick green mucus.  Symptoms have been worsening over the past few days.  She has been taking over-the-counter medication without relief.  No fever.  No other complaints or concerns at this time. ? ?Patient Active Problem List  ? Diagnosis Date Noted  ? Respiratory infection 01/01/2022  ? Immunosuppression ([place]) 11/16/2021  ? Uncontrolled type 2 diabetes mellitus with hyperglycemia ([place]) 10/03/2021  ? Sarcoidosis 08/14/2021  ? Lipoma 03/09/2021  ? GERD (gastroesophageal reflux disease) 07/27/2017  ? Anxiety and depression 12/18/2016  ? IBS (irritable bowel syndrome) 03/18/2016  ? Melanoma of skin ([place]) 03/18/2016  ? Headache, chronic migraine without aura 01/31/2013  ? Hypertension 01/31/2013  ? ? ?Social Hx   ?Social History  ? ?Socioeconomic History  ? Marital status: Single  ?  Spouse name: Not on file  ? Number of children: Not on file  ? Years of education: Not on file  ? Highest education level: Not on file  ?Occupational History  ? Not on file  ?Tobacco Use  ? Smoking status: Never  ? Smokeless tobacco: Never  ?Vaping Use  ? Vaping Use: Never used  ?Substance and Sexual Activity  ? Alcohol use: Yes  ?  Comment: occassionally  ? Drug use: No  ? Sexual activity: Not on file  ?Other Topics Concern  ? Not on file  ?Social History Narrative  ? Not on file  ? ?Social Determinants of Health  ? ?Financial Resource  Strain: Low Risk   ? Difficulty of Paying Living Expenses: Not hard at all  ?Food Insecurity: No Food Insecurity  ? Worried About Charity fundraiser in the Last Year: Never true  ? Ran Out of Food in the Last Year: Never true  ?Transportation Needs: No Transportation Needs  ? Lack of Transportation (Medical): No  ? Lack of Transportation (Non-Medical): No  ?Physical Activity: Inactive  ? Days of Exercise per Week: 0 days  ? Minutes of Exercise per Session: 0 min  ?Stress: Stress Concern Present  ? Feeling of Stress : Very much  ?Social Connections: Socially Isolated  ? Frequency of Communication with Friends and Family: More than three times a week  ? Frequency of Social Gatherings with Friends and Family: More than three times a week  ? Attends Religious Services: Never  ? Active Member of Clubs or Organizations: No  ? Attends Archivist Meetings: Never  ? Marital Status: Never married  ? ? ?Review of Systems ?Per HPI ? ?Objective:  ?BP (!) 150/86   Pulse (!) 103   Temp 98.3 ?F (36.8 ?C)   Wt 201 lb 3.2 oz (91.3 kg)   LMP 10/26/2014   SpO2 94%   BMI 36.21 kg/m?  ? ?BP/Weight 01/01/2022 12/31/2021 12/10/2021  ?Systolic BP 254 270 -  ?Diastolic BP 86 80 -  ?Wt. (Lbs) 201.2 204.8 195  ?BMI 36.21 36.86 34  ? ? ?Physical Exam ?  Vitals and nursing note reviewed.  ?Constitutional:   ?   General: She is not in acute distress. ?HENT:  ?   Head: Normocephalic and atraumatic.  ?   Nose: Congestion present.  ?   Mouth/Throat:  ?   Pharynx: Oropharynx is clear.  ?Cardiovascular:  ?   Rate and Rhythm: Regular rhythm. Tachycardia present.  ?Pulmonary:  ?   Effort: Pulmonary effort is normal.  ?   Breath sounds: Normal breath sounds. No wheezing or rales.  ?Neurological:  ?   Mental Status: She is alert.  ?Psychiatric:     ?   Mood and Affect: Mood normal.     ?   Behavior: Behavior normal.  ? ? ?Lab Results  ?Component Value Date  ? WBC 12.8 (H) 12/31/2021  ? HGB 12.5 12/31/2021  ? HCT 37.3 12/31/2021  ? PLT 293.0  12/31/2021  ? GLUCOSE 176 (H) 12/31/2021  ? CHOL 258 (H) 09/25/2021  ? TRIG 169 (H) 09/25/2021  ? HDL 59 09/25/2021  ? LDLCALC 168 (H) 09/25/2021  ? ALT 35 12/31/2021  ? AST 17 12/31/2021  ? NA 138 12/31/2021  ? K 3.9 12/31/2021  ? CL 103 12/31/2021  ? CREATININE 0.67 12/31/2021  ? BUN 15 12/31/2021  ? CO2 26 12/31/2021  ? TSH 0.996 08/27/2020  ? INR 0.9 07/21/2021  ? HGBA1C 7.9 (H) 09/25/2021  ? ? ? ?Assessment & Plan:  ? ?Problem List Items Addressed This Visit   ? ?  ? Respiratory  ? Respiratory infection - Primary  ?  Given the fact that patient is immunocompromise, placing empirically on Augmentin to cover for bacterial respiratory infection. ?  ?  ? Relevant Medications  ? amoxicillin-clavulanate (AUGMENTIN) 875-125 MG tablet  ? Other Relevant Orders  ? POCT rapid strep A (Completed)  ?  ? Other  ? Immunosuppression ([place])  ? Relevant Medications  ? amoxicillin-clavulanate (AUGMENTIN) 875-125 MG tablet  ? ? ?Meds ordered this encounter  ?Medications  ? amoxicillin-clavulanate (AUGMENTIN) 875-125 MG tablet  ?  Sig: Take 1 tablet by mouth 2 (two) times daily.  ?  Dispense:  14 tablet  ?  Refill:  0  ? ? ?Thersa Salt DO ?[place] ? ?

## 2022-01-04 ENCOUNTER — Other Ambulatory Visit: Payer: Self-pay | Admitting: Family Medicine

## 2022-01-04 DIAGNOSIS — E1165 Type 2 diabetes mellitus with hyperglycemia: Secondary | ICD-10-CM

## 2022-01-06 ENCOUNTER — Encounter: Payer: Self-pay | Admitting: Pulmonary Disease

## 2022-01-06 DIAGNOSIS — Z79899 Other long term (current) drug therapy: Secondary | ICD-10-CM | POA: Insufficient documentation

## 2022-01-18 ENCOUNTER — Other Ambulatory Visit: Payer: Self-pay | Admitting: Pulmonary Disease

## 2022-01-18 ENCOUNTER — Other Ambulatory Visit (HOSPITAL_COMMUNITY): Payer: Self-pay | Admitting: *Deleted

## 2022-01-18 ENCOUNTER — Telehealth (HOSPITAL_COMMUNITY): Payer: Self-pay | Admitting: *Deleted

## 2022-01-18 DIAGNOSIS — D869 Sarcoidosis, unspecified: Secondary | ICD-10-CM

## 2022-01-18 DIAGNOSIS — C7951 Secondary malignant neoplasm of bone: Secondary | ICD-10-CM

## 2022-01-18 DIAGNOSIS — D849 Immunodeficiency, unspecified: Secondary | ICD-10-CM

## 2022-01-18 DIAGNOSIS — Z79899 Other long term (current) drug therapy: Secondary | ICD-10-CM

## 2022-01-18 NOTE — Telephone Encounter (Signed)
Patient called c/o new swollen, tender lymph node to left side of neck.  Described as being moderately large with small amount of swelling to right neck lymph node.  Denies other associated symptoms and is afebrile. Has been present since last Friday.  Appointment made for labs and symptom management appointment with Tarri Abernethy [facility] for Wednesday 3/29. ?

## 2022-01-20 ENCOUNTER — Telehealth: Payer: Self-pay | Admitting: Pulmonary Disease

## 2022-01-20 ENCOUNTER — Ambulatory Visit (HOSPITAL_COMMUNITY): Payer: Medicaid Other | Admitting: Physician Assistant

## 2022-01-20 ENCOUNTER — Other Ambulatory Visit (HOSPITAL_COMMUNITY): Payer: Medicaid Other

## 2022-01-20 DIAGNOSIS — Z79899 Other long term (current) drug therapy: Secondary | ICD-10-CM

## 2022-01-20 DIAGNOSIS — D869 Sarcoidosis, unspecified: Secondary | ICD-10-CM

## 2022-01-20 DIAGNOSIS — D849 Immunodeficiency, unspecified: Secondary | ICD-10-CM

## 2022-01-20 NOTE — Telephone Encounter (Signed)
Dr. Loanne Drilling, please advise if you are okay with pharmacy using a different brand of methotrexate. ?

## 2022-01-20 NOTE — Telephone Encounter (Signed)
Ok to use alternative brand of methotrexate. No change in dosing. ? ?What is the patient on and what do we need to switch to for insurance? ?

## 2022-01-21 MED ORDER — METHOTREXATE 2.5 MG PO TABS: 20.0000 mg | ORAL_TABLET | ORAL | 2 refills | Status: DC

## 2022-01-21 NOTE — Telephone Encounter (Signed)
ATC patient to determine how many tabs of MTX she is taking weekly - left VM requesting return call to clinic so correct rx can be sent. ? ?Refill for MTX was sent yesterday, 01/20/22, with initial MTX titration plan which is incorrect. Patient is NOT re-titrating. ? ?She is taking tablets which do not require prior authorization and brand should also not impact efficacy. ? ?Knox Saliva, PharmD, MPH, BCPS ?Clinical Pharmacist (Rheumatology and Pulmonology) ?

## 2022-01-21 NOTE — Telephone Encounter (Signed)
Patient returned call. She is taking methotreate 8 tablets weekly ('20mg'$  once weekly). She is also taking folic acid '2mg'$  daily. I advised her that I will send in new rx for MTX and she should not pick up what the pharmacy has ready to dispense. She verbalized understanding ? ?Knox Saliva, PharmD, MPH, BCPS ?Clinical Pharmacist (Rheumatology and Pulmonology) ?

## 2022-01-24 NOTE — Progress Notes (Signed)
? ?[facility] ?[address] ?[place], [place] [address] ? ? ?CLINIC:  ?Medical Oncology/Hematology ? ?PCP:  ?Coral Spikes, DO ?[email or web address] [id number] ?[phone] ? ?CHIEF COMPLAINT: Left neck swelling ? ?HISTORY OF PRESENT ILLNESS:  ?[name] 56 y.o. female was initially seen at our clinic for work-up of multiple bone lesions, initial consultation on 06/30/2021.  Bone lesions were ultimately found to be secondary to sarcoidosis with lymphadenopathy in her chest and bones. She was seen by Dr. Loanne Drilling (pulmonology) who is managing her sarcoidosis with prednisone and methotrexate.  Patient called our office last week and scheduled today's appointment due to concern for tender left-sided neck swelling.   ? ?She reports that she noticed neck swelling on both sides about a week ago, which she reports is tender to palpation.  She denies any current infections, but she has had frequent upper respiratory infections this year, her last antibiotic was Augmentin given on 01/01/2022 for about 7 days per her recollection. ?She denies any current signs of infection such as cough, sore throat, fever, chills, nausea, vomiting, diarrhea, or dysuria.  She denies any recent tick bites.  She has night sweats most nights of the week, reports that she wakes up drenched in sweat. ? ?She also reports left-sided facial swelling that began in May of last year after she had skin cancer removed from her left temple. ? ?She is currently taking methotrexate 20 mg weekly.  She is on steroids with prednisone 10 mg daily since October 2022, reports that she is being tapered off.  She continues to follow with Dr. Loanne Drilling (pulmonologist) for her sarcoidosis but has an appointment to see the [facility] in August 2023. ? ?She has 25% energy and 75% appetite. She endorses that she is maintaining a stable weight. ? ? ?REVIEW OF SYSTEMS:  ?Review of Systems  ?Constitutional:  Positive for diaphoresis and  fatigue. Negative for appetite change, chills, fever and unexpected weight change.  ?HENT:   Negative for lump/mass and nosebleeds.   ?Eyes:  Negative for eye problems.  ?Respiratory:  Positive for shortness of breath. Negative for cough and hemoptysis.   ?Cardiovascular:  Positive for palpitations. Negative for chest pain and leg swelling.  ?Gastrointestinal:  Negative for abdominal pain, blood in stool, constipation, diarrhea, nausea and vomiting.  ?Genitourinary:  Negative for hematuria.   ?Skin: Negative.   ?Neurological:  Positive for dizziness and headaches. Negative for light-headedness.  ?Hematological:  Does not bruise/bleed easily.  ?Psychiatric/Behavioral:  Positive for depression. The patient is nervous/anxious.    ? ? ?PAST MEDICAL/SURGICAL HISTORY:  ?Past Medical History:  ?Diagnosis Date  ? Complication of anesthesia   ? woke up during endoscopy and colonoscopy  ? Depression   ? Hemorrhoids   ? History of esophageal stricture   ? 2011  peptic w/  dilatation  ? History of gastritis   ? 2011  ? History of melanoma excision   ? 2015  left leg/   12-09-2015 back   ? Hypertension   ? Microhematuria   ? Migraines   ? Nephrolithiasis   ? left side non-obstructive   ? Right ureteral stone   ? Sarcoidosis 07/24/2021  ? Urgency of urination   ? Wears contact lenses   ? ?Past Surgical History:  ?Procedure Laterality Date  ? ABDOMINAL HYSTERECTOMY    ? BIOPSY  11/16/2021  ? Procedure: BIOPSY;  Surgeon: Eloise Harman, DO;  [facility]: [facility];  Service:  Endoscopy;;  ? CARPAL TUNNEL RELEASE Right 01-05- 2015  ? COLONOSCOPY N/A 08/27/2015  ? Procedure: COLONOSCOPY;  Surgeon: Danie Binder, MD;  Location: [facility];  Service: Endoscopy;  Laterality: N/A;  0830  ? COLONOSCOPY WITH PROPOFOL N/A 11/16/2021  ? Procedure: COLONOSCOPY WITH PROPOFOL;  Surgeon: Eloise Harman, DO;  [facility]: [facility];  Service: Endoscopy;  Laterality: N/A;  2:00pm  ? CYST EXCISION  2012    ? right hand  ? CYSTOSCOPY W/  URETERAL STENT PLACEMENT Right 12/22/2015  ? Procedure: CYSTOSCOPY WITH RETROGRADE PYELOGRAM/URETERAL STENT PLACEMENT;  Surgeon: Nickie Retort, MD;  Location: [facility];  Service: Urology;  Laterality: Right;  ? CYSTOSCOPY WITH RETROGRADE PYELOGRAM, URETEROSCOPY AND STENT PLACEMENT Right 01/05/2016  ? Procedure: CYSTOSCOPY WITH RETROGRADE PYELOGRAM, URETEROSCOPY AND STENT REPLACEMENT;  Surgeon: Nickie Retort, MD;  Location: [facility];  Service: Urology;  Laterality: Right;  ? DILATION AND CURETTAGE OF UTERUS  03-12-2009  ? w/  Suction  ? double balloon enteroscopy    ? Dr. Arsenio Loader at [facility]: no erosions, no evidence of Crohn's disease, path without Crohn's.   ? EGD with push enteroscopy  02-12-2010  ?  patent distal peptic stricture with diffuse antral erythema, normal D1 and D2   ? ESOPHAGOGASTRODUODENOSCOPY (EGD) WITH PROPOFOL N/A 11/16/2021  ? Procedure: ESOPHAGOGASTRODUODENOSCOPY (EGD) WITH PROPOFOL;  Surgeon: Eloise Harman, DO;  [facility];  Service: Endoscopy;  Laterality: N/A;  ? LAPAROSCOPIC ASSISTED VAGINAL HYSTERECTOMY  10-26-2014  ? w/  Bilateral Salpingoophorectomy  ? STONE EXTRACTION WITH BASKET Right 01/05/2016  ? Procedure: STONE EXTRACTION WITH BASKET;  Surgeon: Nickie Retort, MD;  Location: [facility];  Service: Urology;  Laterality: Right;  ? TONSILLECTOMY  1998  approx  ? ? ? ?SOCIAL HISTORY:  ?Social History  ? ?Socioeconomic History  ? Marital status: Single  ?  Spouse name: Not on file  ? Number of children: Not on file  ? Years of education: Not on file  ? Highest education level: Not on file  ?Occupational History  ? Not on file  ?Tobacco Use  ? Smoking status: Never  ? Smokeless tobacco: Never  ?Vaping Use  ? Vaping Use: Never used  ?Substance and Sexual Activity  ? Alcohol use: Yes  ?  Comment: occassionally  ? Drug use: No  ? Sexual activity: Not on file  ?Other Topics Concern  ? Not on file  ?Social History Narrative  ? Not on  file  ? ?Social Determinants of Health  ? ?Financial Resource Strain: Low Risk   ? Difficulty of Paying Living Expenses: Not hard at all  ?Food Insecurity: No Food Insecurity  ? Worried About Charity fundraiser in the Last Year: Never true  ? Ran Out of Food in the Last Year: Never true  ?Transportation Needs: No Transportation Needs  ? Lack of Transportation (Medical): No  ? Lack of Transportation (Non-Medical): No  ?Physical Activity: Inactive  ? Days of Exercise per Week: 0 days  ? Minutes of Exercise per Session: 0 min  ?Stress: Stress Concern Present  ? Feeling of Stress : Very much  ?Social Connections: Socially Isolated  ? Frequency of Communication with Friends and Family: More than three times a week  ? Frequency of Social Gatherings with Friends and Family: More than three times a week  ? Attends Religious Services: Never  ? Active Member of Clubs or Organizations: No  ? Attends Archivist  Meetings: Never  ? Marital Status: Never married  ?Intimate Partner Violence: Not At Risk  ? Fear of Current or Ex-Partner: No  ? Emotionally Abused: No  ? Physically Abused: No  ? Sexually Abused: No  ? ? ?FAMILY HISTORY:  ?Family History  ?Problem Relation Age of Onset  ? Hypertension Mother   ? Thyroid disease Mother   ? Diabetes Mother   ? Hyperlipidemia Mother   ? Stroke Mother   ? Hyperlipidemia Father   ? Hypertension Father   ? Heart attack Father   ? Heart failure Father   ? Colon cancer Neg Hx   ? ? ?CURRENT MEDICATIONS:  ?Outpatient Encounter Medications as of 01/25/2022  ?Medication Sig  ? acetaminophen (TYLENOL) 500 MG tablet Take 1,000 mg by mouth every 6 (six) hours as needed for moderate pain.  ? albuterol (VENTOLIN HFA) 108 (90 Base) MCG/ACT inhaler Inhale 2 puffs into the lungs every 6 (six) hours as needed for wheezing or shortness of breath.  ? amoxicillin-clavulanate (AUGMENTIN) 875-125 MG tablet Take 1 tablet by mouth 2 (two) times daily.  ? busPIRone (BUSPAR) 5 MG tablet TAKE 1 TABLET BY  MOUTH THREE TIMES DAILY (Patient taking differently: Take 5 mg by mouth 3 (three) times daily as needed (anxiety).)  ? calcium carbonate (TUMS - DOSED IN MG ELEMENTAL CALCIUM) 500 MG chewable tablet Chew 1,500 mg

## 2022-01-25 ENCOUNTER — Inpatient Hospital Stay (HOSPITAL_COMMUNITY): Payer: Medicaid Other | Attending: Physician Assistant

## 2022-01-25 ENCOUNTER — Encounter (HOSPITAL_COMMUNITY): Payer: Self-pay | Admitting: Physician Assistant

## 2022-01-25 ENCOUNTER — Inpatient Hospital Stay (HOSPITAL_COMMUNITY): Payer: Medicaid Other | Admitting: Physician Assistant

## 2022-01-25 ENCOUNTER — Ambulatory Visit: Payer: Medicaid Other | Admitting: Internal Medicine

## 2022-01-25 VITALS — BP 123/86 | HR 77 | Temp 99.2°F | Resp 18 | Wt 200.6 lb

## 2022-01-25 DIAGNOSIS — D869 Sarcoidosis, unspecified: Secondary | ICD-10-CM | POA: Diagnosis not present

## 2022-01-25 DIAGNOSIS — R519 Headache, unspecified: Secondary | ICD-10-CM | POA: Diagnosis not present

## 2022-01-25 DIAGNOSIS — R110 Nausea: Secondary | ICD-10-CM | POA: Insufficient documentation

## 2022-01-25 DIAGNOSIS — Z90710 Acquired absence of both cervix and uterus: Secondary | ICD-10-CM | POA: Diagnosis not present

## 2022-01-25 DIAGNOSIS — I10 Essential (primary) hypertension: Secondary | ICD-10-CM | POA: Diagnosis not present

## 2022-01-25 DIAGNOSIS — M25552 Pain in left hip: Secondary | ICD-10-CM | POA: Diagnosis not present

## 2022-01-25 DIAGNOSIS — R221 Localized swelling, mass and lump, neck: Secondary | ICD-10-CM | POA: Insufficient documentation

## 2022-01-25 DIAGNOSIS — C7951 Secondary malignant neoplasm of bone: Secondary | ICD-10-CM

## 2022-01-25 LAB — COMPREHENSIVE METABOLIC PANEL
ALT: 31 U/L (ref 0–44)
AST: 23 U/L (ref 15–41)
Albumin: 3.9 g/dL (ref 3.5–5.0)
Alkaline Phosphatase: 52 U/L (ref 38–126)
Anion gap: 9 (ref 5–15)
BUN: 10 mg/dL (ref 6–20)
CO2: 23 mmol/L (ref 22–32)
Calcium: 9.2 mg/dL (ref 8.9–10.3)
Chloride: 108 mmol/L (ref 98–111)
Creatinine, Ser: 0.65 mg/dL (ref 0.44–1.00)
GFR, Estimated: >60 mL/min (ref >60)
Glucose, Bld: 147 mg/dL — ABNORMAL HIGH (ref 70–99)
Potassium: 3.7 mmol/L (ref 3.5–5.1)
Sodium: 140 mmol/L (ref 135–145)
Total Bilirubin: 0.5 mg/dL (ref 0.3–1.2)
Total Protein: 6.6 g/dL (ref 6.5–8.1)

## 2022-01-25 LAB — CBC WITH DIFFERENTIAL/PLATELET
Abs Immature Granulocytes: 0.05 10*3/uL (ref 0.00–0.07)
Basophils Absolute: 0.1 10*3/uL (ref 0.0–0.1)
Basophils Relative: 1 %
Eosinophils Absolute: 0.1 10*3/uL (ref 0.0–0.5)
Eosinophils Relative: 1 %
HCT: 38.1 % (ref 36.0–46.0)
Hemoglobin: 12.5 g/dL (ref 12.0–15.0)
Immature Granulocytes: 1 %
Lymphocytes Relative: 15 %
Lymphs Abs: 1.5 10*3/uL (ref 0.7–4.0)
MCH: 29.7 pg (ref 26.0–34.0)
MCHC: 32.8 g/dL (ref 30.0–36.0)
MCV: 90.5 fL (ref 80.0–100.0)
Monocytes Absolute: 0.6 10*3/uL (ref 0.1–1.0)
Monocytes Relative: 6 %
Neutro Abs: 7.5 10*3/uL (ref 1.7–7.7)
Neutrophils Relative %: 76 %
Platelets: 296 10*3/uL (ref 150–400)
RBC: 4.21 MIL/uL (ref 3.87–5.11)
RDW: 14.7 % (ref 11.5–15.5)
WBC: 9.8 10*3/uL (ref 4.0–10.5)
nRBC: 0 % (ref 0.0–0.2)

## 2022-01-25 LAB — LACTATE DEHYDROGENASE: LDH: 192 U/L (ref 98–192)

## 2022-01-25 NOTE — Patient Instructions (Signed)
[facility] ?Discharge Instructions ? ?You were seen today by Tarri Abernethy PA-C for your face and neck swelling. ? ?The swelling of your shoulders, neck, and upper back are not concerning.  You did not have any swollen lymph nodes in this area.  I suspect that you have some fat pad swelling due to steroid use. ? ?Swelling on the left side of your face may be LYMPHEDEMA, which is caused by abnormal fluid drainage from your soft tissues.  This may have been an aftereffect of your skin cancer surgery from last year.  Please make an appointment with your SKIN SURGEON to discuss this finding and to see if any further work-up or treatment is needed. ? ?Continue follow-up with Dr. Loanne Drilling and the sarcoidosis clinic at [facility] for management of your sarcoid disease. ? ?FOLLOW-UP APPOINTMENT: Office visit with Dr. Delton Coombes as scheduled in October 2023. ? ? ?Thank you for choosing [place] [facility] at [facility] to provide your oncology and hematology care.  To afford each patient quality time with our provider, please arrive at least 15 minutes before your scheduled appointment time.  ? ?If you have a lab appointment with the [facility] please come in thru the Main Entrance and check in at the main information desk. ? ?You need to re-schedule your appointment should you arrive 10 or more minutes late.  We strive to give you quality time with our providers, and arriving late affects you and other patients whose appointments are after yours.  Also, if you no show three or more times for appointments you may be dismissed from the clinic at the providers discretion.     ?Again, thank you for choosing [facility].  Our hope is that these requests will decrease the amount of time that you wait before being seen by our physicians.       ?_____________________________________________________________ ? ?Should you have questions after your visit to [facility], please contact our office at [phone] and follow the prompts.  Our office hours are 8:00 a.m. and 4:30 p.m. Monday - Friday.  Please note that voicemails left after 4:00 p.m. may not be returned until the following business day.  We are closed weekends and major holidays.  You do have access to a nurse 24-7, just call the main number to the clinic [phone] and do not press any options, hold on the line and a nurse will answer the phone.   ? ?For prescription refill requests, have your pharmacy contact our office and allow 72 hours.   ? ?Due to Covid, you will need to wear a mask upon entering the hospital. If you do not have a mask, a mask will be given to you at the Main Entrance upon arrival. For doctor visits, patients may have 1 support person age 78 or older with them. For treatment visits, patients can not have anyone with them due to social distancing guidelines and our immunocompromised population.  ? ? ? ?

## 2022-02-08 ENCOUNTER — Other Ambulatory Visit: Payer: Self-pay | Admitting: Family Medicine

## 2022-02-08 DIAGNOSIS — E1165 Type 2 diabetes mellitus with hyperglycemia: Secondary | ICD-10-CM

## 2022-02-10 ENCOUNTER — Encounter: Payer: Self-pay | Admitting: Pulmonary Disease

## 2022-02-10 ENCOUNTER — Ambulatory Visit: Payer: Medicaid Other | Admitting: Pulmonary Disease

## 2022-02-10 VITALS — BP 108/80 | HR 88 | Ht 62.5 in | Wt 198.2 lb

## 2022-02-10 DIAGNOSIS — Z79899 Other long term (current) drug therapy: Secondary | ICD-10-CM

## 2022-02-10 DIAGNOSIS — D869 Sarcoidosis, unspecified: Secondary | ICD-10-CM | POA: Diagnosis not present

## 2022-02-10 DIAGNOSIS — D849 Immunodeficiency, unspecified: Secondary | ICD-10-CM | POA: Diagnosis not present

## 2022-02-10 LAB — COMPREHENSIVE METABOLIC PANEL
ALT: 45 U/L — ABNORMAL HIGH (ref 0–35)
AST: 34 U/L (ref 0–37)
Albumin: 4.2 g/dL (ref 3.5–5.2)
Alkaline Phosphatase: 63 U/L (ref 39–117)
BUN: 9 mg/dL (ref 6–23)
CO2: 27 mEq/L (ref 19–32)
Calcium: 9.1 mg/dL (ref 8.4–10.5)
Chloride: 101 mEq/L (ref 96–112)
Creatinine, Ser: 0.63 mg/dL (ref 0.40–1.20)
GFR: 101.01 mL/min (ref >60.00)
Glucose, Bld: 156 mg/dL — ABNORMAL HIGH (ref 70–99)
Potassium: 3.4 mEq/L — ABNORMAL LOW (ref 3.5–5.1)
Sodium: 138 mEq/L (ref 135–145)
Total Bilirubin: 0.5 mg/dL (ref 0.2–1.2)
Total Protein: 6.5 g/dL (ref 6.0–8.3)

## 2022-02-10 LAB — CBC WITH DIFFERENTIAL/PLATELET
Basophils Absolute: 0 10*3/uL (ref 0.0–0.1)
Basophils Relative: 0.5 % (ref 0.0–3.0)
Eosinophils Absolute: 0.2 10*3/uL (ref 0.0–0.7)
Eosinophils Relative: 2.5 % (ref 0.0–5.0)
HCT: 37.3 % (ref 36.0–46.0)
Hemoglobin: 12.6 g/dL (ref 12.0–15.0)
Lymphocytes Relative: 24.4 % (ref 12.0–46.0)
Lymphs Abs: 1.9 10*3/uL (ref 0.7–4.0)
MCHC: 33.8 g/dL (ref 30.0–36.0)
MCV: 89.3 fl (ref 78.0–100.0)
Monocytes Absolute: 0.4 10*3/uL (ref 0.1–1.0)
Monocytes Relative: 5.2 % (ref 3.0–12.0)
Neutro Abs: 5.2 10*3/uL (ref 1.4–7.7)
Neutrophils Relative %: 67.4 % (ref 43.0–77.0)
Platelets: 312 10*3/uL (ref 150.0–400.0)
RBC: 4.18 Mil/uL (ref 3.87–5.11)
RDW: 15.5 % (ref 11.5–15.5)
WBC: 7.7 10*3/uL (ref 4.0–10.5)

## 2022-02-10 MED ORDER — FOLIC ACID 1 MG PO TABS: 2.0000 mg | ORAL_TABLET | Freq: Every day | ORAL | 2 refills | Status: DC

## 2022-02-10 MED ORDER — METHOTREXATE 2.5 MG PO TABS: 20.0000 mg | ORAL_TABLET | ORAL | 4 refills | Status: DC

## 2022-02-10 NOTE — Patient Instructions (Addendum)
Pulmonary sarcoid with restrictive defect ?--CONTINUE methotrexate 20 mg weekly ?--CONTINUE folic acid 2 mg daily ?--CONTINUE Albuterol TWO puffs as needed for shortness of breath ?--ORDER CBC or CMET. Will send mychart message ?--REFER [facility] (sarcoid clinic) ? ?Osteoporosis prevention in setting of chronic steroid use ?--START Calcium 1000-1200 mg daily and vitamin D 600-800 units daily through diet or supplements ? ?Follow-up with me in 2 months ?

## 2022-02-10 NOTE — Progress Notes (Signed)
? ? ?Subjective:  ? ?PATIENT ID: Courtney Hale GENDER: female DOB: 10/16/1966, MRN: [id number] ? ? ?HPI ? ?Chief Complaint  ?Patient presents with  ? Follow-up  ?  Sarcoid f/u ?All over pain  ? ?Reason for Visit: Follow-up ? ?Courtney Hale is a 56 year old female with history of melanoma s/p skin resections with DM2 who presents for follow-up sarcoidosis. ? ?Synopsis: ?She was initially referred to [facility] shortness of breath with exertion and at rest.  She was seen by Dr. Melvyn Novas for sarcoid. She has been followed by Dr. Delton Coombes in hematology for multiple bone lesions after presenting for left hip pain since February 2022. PET demonstrated hypermetabolic mediastinal lymphadenopathy and right iliac, right sacral ala and anterior left frontal bone lesions. She underwent bone biopsy of right iliac bone lesion on 07/21/2021 which returned with noncaseating granulomatous inflammation. Dr. Melvyn Novas started patient on 20 mg prednisone and transferred to my care in November 2022. ? ?08/27/21 ?Yesterday she presented to the ED for acute on chronic atypical chest pain that was worsening.  EKG negative for ischemia.  ED PA evaluated and had low suspicion for ACS.  Started on PPI for reflux and referred to GI. Since starting the steroids she reports she is unsure if it has been effective. She has shortness of breath with panic attacks. Sometimes unable to take a deep breath. Occasional dry cough. ? ?10/01/21 ?She reports that she is unclear if her current steroid dose is helping. She has used her inhaler for shortness of breath and uses albuterol 2-3 times a day for two days a week. She continues to have left hip pain and left head pain. Right side doesn't bother her. She reports seeing out of her left eye with blurriness and double vision at times which she attributes to facial swelling and pain.  ? ?11/10/20 ?She saw her Ophthalmologist this morning. Reports no sarcoid but diagnosed with bilateral glaucoma. Bone pain  in her left head and hip and has not improved despite steroids. She continues to have shortness of breath and using albuterol as needed 2-3 times a week. She was recently treated on doxycycline for productive cough. ? ?12/31/21 ?Currently methotrexate and prednisone 30 mg. She reports ongoing bone pain and shortness of breath however does report she has been able to increase her activity. Continues to use her albuterol 2-3 times a week. ? ?02/10/22 ?She is currently on methotrexate 20 mg weekly and has titrated down to prednisone 5 mg but feels her breathing is worse. Will use albuterol once a day for shortness of breath. Her bone pain continues to limit her activity overall. She does not know how effective the prednisone is but she does not like swelling associated with it. Her left headaches/pain has seemed to get worse too. She is scheduled with Rheumatology.  ? ? ?Past Medical History:  ?Diagnosis Date  ? Complication of anesthesia   ? woke up during endoscopy and colonoscopy  ? Depression   ? Hemorrhoids   ? History of esophageal stricture   ? 2011  peptic w/  dilatation  ? History of gastritis   ? 2011  ? History of melanoma excision   ? 2015  left leg/   12-09-2015 back   ? Hypertension   ? Microhematuria   ? Migraines   ? Nephrolithiasis   ? left side non-obstructive   ? Right ureteral stone   ? Sarcoidosis 07/24/2021  ? Urgency of urination   ? Wears contact lenses   ?  ? ?  Family History  ?Problem Relation Age of Onset  ? Hypertension Mother   ? Thyroid disease Mother   ? Diabetes Mother   ? Hyperlipidemia Mother   ? Stroke Mother   ? Hyperlipidemia Father   ? Hypertension Father   ? Heart attack Father   ? Heart failure Father   ? Colon cancer Neg Hx   ?  ? ?Social History  ? ?Occupational History  ? Not on file  ?Tobacco Use  ? Smoking status: Never  ? Smokeless tobacco: Never  ?Vaping Use  ? Vaping Use: Never used  ?Substance and Sexual Activity  ? Alcohol use: Yes  ?  Comment: occassionally  ? Drug use: No   ? Sexual activity: Not on file  ? ? ?Allergies  ?Allergen Reactions  ? Topamax [Topiramate] Other (See Comments)  ?  Developed kidney stones  ? Fluorouracil Swelling  ?  Burning skin  ? Sulfa Antibiotics Other (See Comments)  ?  Burning sensation, flushing to skin  ?  ? ?Outpatient Medications Prior to Visit  ?Medication Sig Dispense Refill  ? acetaminophen (TYLENOL) 500 MG tablet Take 1,000 mg by mouth every 6 (six) hours as needed for moderate pain.    ? albuterol (VENTOLIN HFA) 108 (90 Base) MCG/ACT inhaler Inhale 2 puffs into the lungs every 6 (six) hours as needed for wheezing or shortness of breath. 8 g 6  ? BD PEN NEEDLE NANO 2ND GEN 32G X 4 MM MISC USE AS DIRECTED 100 each 0  ? busPIRone (BUSPAR) 5 MG tablet TAKE 1 TABLET BY MOUTH THREE TIMES DAILY (Patient taking differently: Take 5 mg by mouth 3 (three) times daily as needed (anxiety).) 45 tablet 2  ? calcium carbonate (TUMS - DOSED IN MG ELEMENTAL CALCIUM) 500 MG chewable tablet Chew 1,500 mg by mouth 2 (two) times daily as needed for indigestion or heartburn.    ? Carboxymethylcellul-Glycerin (LUBRICATING EYE DROPS OP) Place 1 drop into both eyes daily as needed (dry eyes).    ? Chromium 1000 MCG TABS Take 1,000 mcg by mouth daily.    ? CINNAMON PO Take 2,000 mg by mouth daily.    ? Continuous Blood Gluc Receiver (FREESTYLE LIBRE 2 READER) DEVI Use as directed to check blood sugars. 1 each 0  ? Continuous Blood Gluc Sensor (FREESTYLE LIBRE 2 SENSOR) MISC Use sensor as directed. Change every 10-14 days. 4 each 3  ? Cyanocobalamin (B-12) 2000 MCG TABS Take 2,000 mcg by mouth daily.    ? EPINEPHrine 0.3 mg/0.3 mL IJ SOAJ injection Inject 0.3 mg into the muscle as needed for anaphylaxis. 1 each 0  ? folic acid (FOLVITE) 1 MG tablet Take 1 tablet daily for 14 days, then increase to 2 tablets daily thereafter. 90 tablet 1  ? hydrochlorothiazide (HYDRODIURIL) 25 MG tablet Take 1 tablet (25 mg total) by mouth daily. 90 tablet 1  ? HYDROcodone-acetaminophen  (NORCO/VICODIN) 5-325 MG tablet Take 1 tablet by mouth every 8 (eight) hours as needed for moderate pain. 30 tablet 0  ? insulin aspart (NOVOLOG FLEXPEN) 100 UNIT/ML FlexPen Inject 10-16 Units into the skin 3 (three) times daily with meals. 30 mL 1  ? losartan (COZAAR) 100 MG tablet TAKE 1 TABLET BY MOUTH ONCE DAILY. 90 tablet 1  ? meclizine (ANTIVERT) 25 MG tablet Take 25 mg by mouth 3 (three) times daily as needed for dizziness.    ? metFORMIN (GLUCOPHAGE) 850 MG tablet Take 1 tablet (850 mg total) by mouth 2 (two) times  daily with a meal. 180 tablet 1  ? methotrexate (RHEUMATREX) 2.5 MG tablet Take 8 tablets (20 mg total) by mouth once a week. 32 tablet 2  ? omeprazole (PRILOSEC) 40 MG capsule Take 1 capsule (40 mg total) by mouth 2 (two) times daily before a meal. 60 capsule 5  ? ondansetron (ZOFRAN-ODT) 4 MG disintegrating tablet Take 4 mg by mouth every 8 (eight) hours as needed for nausea or vomiting.    ? PARoxetine (PAXIL) 40 MG tablet Take 1 tablet (40 mg total) by mouth every morning. 90 tablet 1  ? predniSONE (DELTASONE) 5 MG tablet Take 4 tablets (20 mg total) by mouth daily with breakfast for 14 days, THEN 2 tablets (10 mg total) daily with breakfast for 14 days, THEN 1 tablet (5 mg total) daily with breakfast for 14 days. Start this when you start methotrexate 20 mg weekly. 98 tablet 0  ? Probiotic Product (PROBIOTIC PO) Take 1 capsule by mouth daily.    ? prochlorperazine (COMPAZINE) 10 MG tablet Take 1 tablet (10 mg total) by mouth every 6 (six) hours as needed for nausea or vomiting. 30 tablet 2  ? rizatriptan (MAXALT-MLT) 10 MG disintegrating tablet Take 1 tablet (10 mg total) by mouth as needed for migraine. May repeat in 2 hours if needed. Max 2 per 24 hours 10 tablet 1  ? SEMGLEE, YFGN, 100 UNIT/ML Pen SMARTSIG:35 Unit(s) SUB-Q Every Night    ? traZODone (DESYREL) 100 MG tablet Take 1 tablet (100 mg total) by mouth at bedtime as needed for sleep. 90 tablet 1  ? valACYclovir (VALTREX) 500 MG  tablet Take 500 mg by mouth 2 (two) times daily as needed (outbreaks).    ? ?No facility-administered medications prior to visit.  ? ? ?Review of Systems  ?Constitutional:  Negative for chills, diaphoresis,

## 2022-02-23 NOTE — Progress Notes (Signed)
? ?Office Visit Note ? ?Patient: Courtney Hale             ?Date of Birth: 1966-05-19           ?MRN: [id number]             ?PCP: Coral Spikes, DO ?Referring: Coral Spikes, DO ?Visit Date: 02/24/2022 ? ?Subjective:  ?New Patient (Initial Visit) (Total body pain) ? ? ?History of Present Illness: Courtney Hale is a 56 y.o. 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 Symptoms started with dyspnea on exertion and then resting dyspnea leading to evaluation with numerous pulmonary nodules. Multiple bone lesions were present on imaging since at least 11/2020 found to be hypermetabolic on PET imaging and biopsy confirming noncaseating granulomas. She has history of previously resected melanomas but no concerning pathology. She is on prednisone since October 2022, methotrexate was started in February and prednisone tapering.  So far she has not noticed a significant difference in symptoms with any of these medications.  She has pretty much generalized body pains although hip problems are her worst.  Lab testing maintains normal renal function and no hypercalcemia. Ophthalmology exam showing glaucoma no uveitis or iritis. ? ? ?Activities of Daily Living:  ?Patient reports morning stiffness for 24 hours.   ?Patient Reports nocturnal pain.  ?Difficulty dressing/grooming: Reports ?Difficulty climbing stairs: Reports ?Difficulty getting out of chair: Reports ?Difficulty using hands for taps, buttons, cutlery, and/or writing: Reports ? ?Review of Systems  ?Constitutional:  Positive for fatigue.  ?HENT:  Positive for mouth dryness.   ?Eyes:  Positive for dryness.  ?Respiratory:  Positive for shortness of breath.   ?Cardiovascular:  Negative for swelling in legs/feet.  ?Gastrointestinal:  Positive for diarrhea.  ?Endocrine: Positive for heat intolerance, excessive thirst and increased urination.  ?Genitourinary:   Negative for difficulty urinating.  ?Musculoskeletal:  Positive for joint pain, gait problem, joint pain, muscle weakness, morning stiffness and muscle tenderness.  ?Skin:  Positive for nodules/bumps.  ?Allergic/Immunologic: Negative for susceptible to infections.  ?Neurological:  Positive for numbness and weakness.  ?Hematological:  Negative for bruising/bleeding tendency.  ?Psychiatric/Behavioral:  Positive for sleep disturbance.   ? ?PMFS History:  ?Patient Active Problem List  ? Diagnosis Date Noted  ? Generalized pain 02/24/2022  ? High risk medication use 01/06/2022  ? Respiratory infection 01/01/2022  ? Immunosuppression ([place]) 11/16/2021  ? Uncontrolled type 2 diabetes mellitus with hyperglycemia ([place]) 10/03/2021  ? Sarcoidosis 08/14/2021  ? Pain in left hip 03/09/2021  ? Lipoma 03/09/2021  ? GERD (gastroesophageal reflux disease) 07/27/2017  ? Anxiety and depression 12/18/2016  ? IBS (irritable bowel syndrome) 03/18/2016  ? Melanoma of skin ([place]) 03/18/2016  ? Headache, chronic migraine without aura 01/31/2013  ? Hypertension 01/31/2013  ?  ?Past Medical History:  ?Diagnosis Date  ? Complication of anesthesia   ? woke up during endoscopy and colonoscopy  ? Depression   ? Diabetes ([place])   ? Hemorrhoids   ? History of esophageal stricture   ? 2011  peptic w/  dilatation  ? History of gastritis   ? 2011  ? History of melanoma excision   ? 2015  left leg/   12-09-2015 back   ? Hypertension   ? Microhematuria   ? Migraines   ? Nephrolithiasis   ? left side non-obstructive   ? Right ureteral stone   ? Sarcoidosis 07/24/2021  ? Urgency  of urination   ? Wears contact lenses   ?  ?Family History  ?Problem Relation Age of Onset  ? Hypertension Mother   ? Thyroid disease Mother   ? Diabetes Mother   ? Hyperlipidemia Mother   ? Stroke Mother   ? Heart disease Mother   ? Hyperlipidemia Father   ? Hypertension Father   ? Heart attack Father   ? Heart failure Father   ? Colon cancer Neg Hx   ? ?Past Surgical History:   ?Procedure Laterality Date  ? ABDOMINAL HYSTERECTOMY    ? BIOPSY  11/16/2021  ? Procedure: BIOPSY;  Surgeon: Eloise Harman, DO;  [facility];  Service: Endoscopy;;  ? CARPAL TUNNEL RELEASE Right 10/29/2013  ? COLONOSCOPY N/A 08/27/2015  ? Procedure: COLONOSCOPY;  Surgeon: Danie Binder, MD;  [facility];  Service: Endoscopy;  Laterality: N/A;  0830  ? COLONOSCOPY WITH PROPOFOL N/A 11/16/2021  ? Procedure: COLONOSCOPY WITH PROPOFOL;  Surgeon: Eloise Harman, DO;  [facility];  Service: Endoscopy;  Laterality: N/A;  2:00pm  ? CYST EXCISION  10/25/2010  ? right hand  ? CYSTOSCOPY W/ URETERAL STENT PLACEMENT Right 12/22/2015  ? Procedure: CYSTOSCOPY WITH RETROGRADE PYELOGRAM/URETERAL STENT PLACEMENT;  Surgeon: Nickie Retort, MD;  Location: [facility];  Service: Urology;  Laterality: Right;  ? CYSTOSCOPY WITH RETROGRADE PYELOGRAM, URETEROSCOPY AND STENT PLACEMENT Right 01/05/2016  ? Procedure: CYSTOSCOPY WITH RETROGRADE PYELOGRAM, URETEROSCOPY AND STENT REPLACEMENT;  Surgeon: Nickie Retort, MD;  Location: [facility];  Service: Urology;  Laterality: Right;  ? DILATION AND CURETTAGE OF UTERUS  03/12/2009  ? w/  Suction  ? double balloon enteroscopy    ? Dr. Arsenio Loader at [facility]: no erosions, no evidence of Crohn's disease, path without Crohn's.   ? EGD with push enteroscopy  02/12/2010  ?  patent distal peptic stricture with diffuse antral erythema, normal D1 and D2   ? ESOPHAGOGASTRODUODENOSCOPY (EGD) WITH PROPOFOL N/A 11/16/2021  ? Procedure: ESOPHAGOGASTRODUODENOSCOPY (EGD) WITH PROPOFOL;  Surgeon: Eloise Harman, DO;  [facility];  Service: Endoscopy;  Laterality: N/A;  ? HAND SURGERY    ? LAPAROSCOPIC ASSISTED VAGINAL HYSTERECTOMY  10/26/2014  ? w/  Bilateral Salpingoophorectomy  ? STONE EXTRACTION WITH BASKET Right 01/05/2016  ? Procedure: STONE EXTRACTION WITH BASKET;  Surgeon: Nickie Retort, MD;  Location: [facility];  Service: Urology;  Laterality: Right;  ? TONSILLECTOMY  1998  approx  ? ?Social History  ? ?Social History Narrative  ? Not on file  ? ?Immunization History  ?Administered Date(s) Administered  ? Influenza,inj,Quad PF,6+ Mos 09/09/2016, 08/29/2017, 09/27/2019, 10/30/2020, 08/14/2021  ? Influenza-Unspecified 08/22/2013, 07/22/2014, 08/08/2015  ? MMR 01/22/2011  ? PFIZER(Purple Top)SARS-COV-2 Vaccination 12/30/2019, 01/20/2020  ? Tdap 01/22/2011, 04/17/2014  ?  ? ?Objective: ?Vital Signs: BP 116/74 (BP Location: Right Arm, Patient Position: Sitting, Cuff Size: Normal)   Pulse 92   Resp 15   Ht '5\' 3"'  (1.6 m)   Wt 191 lb (86.6 kg)   LMP 10/26/2014   BMI 33.83 kg/m?   ? ?Physical Exam ?Constitutional:   ?   Appearance: She is obese.  ?HENT:  ?   Mouth/Throat:  ?   Mouth: Mucous membranes are moist.  ?   Pharynx: Oropharynx is clear.  ?Eyes:  ?   Conjunctiva/sclera: Conjunctivae normal.  ?Cardiovascular:  ?   Rate and Rhythm: Normal rate and regular rhythm.  ?Pulmonary:  ?   Effort:  Pulmonary effort is normal.  ?   Breath sounds: Normal breath sounds.  ?Musculoskeletal:  ?   Right lower leg: No edema.  ?   Left lower leg: No edema.  ?Skin: ?   General: Skin is warm and dry.  ?   Comments: Very dry appearing faintly dusky skin discoloration on bilateral hands  ?Neurological:  ?   Mental Status: She is alert.  ?Psychiatric:     ?   Mood and Affect: Mood normal.  ?  ? ?Musculoskeletal Exam:  ?Shoulders full ROM worse approximately to the upper back ?Tenderness to pressure at paraspinal muscles in upper and lower back and pain between scapula ?Elbows full ROM no tenderness or swelling ?Wrists full ROM no tenderness or swelling ?Fingers full ROM no tenderness or swelling ?Left worse than right lateral hip tenderness to palpation, no pain radiating to groin or anteriorly ?Patellofemoral crepitus of bilateral knees full range of motion ? ?Investigation: ?No additional findings. ? ?Imaging: ?No results  found. ? ?Recent Labs: ?Lab Results  ?Component Value Date  ? WBC 7.7 02/10/2022  ? HGB 12.6 02/10/2022  ? PLT 312.0 02/10/2022  ? NA 138 02/10/2022  ? K 3.4 (L) 02/10/2022  ? CL 101 02/10/2022  ? CO2 27 02/10/2022  ? GLUCOSE 156

## 2022-02-24 ENCOUNTER — Encounter: Payer: Self-pay | Admitting: Internal Medicine

## 2022-02-24 ENCOUNTER — Ambulatory Visit: Payer: Medicaid Other | Admitting: Internal Medicine

## 2022-02-24 VITALS — BP 116/74 | HR 92 | Resp 15 | Ht 63.0 in | Wt 191.0 lb

## 2022-02-24 DIAGNOSIS — R52 Pain, unspecified: Secondary | ICD-10-CM | POA: Diagnosis not present

## 2022-02-24 DIAGNOSIS — M25552 Pain in left hip: Secondary | ICD-10-CM | POA: Diagnosis not present

## 2022-02-24 DIAGNOSIS — Z79899 Other long term (current) drug therapy: Secondary | ICD-10-CM

## 2022-02-24 DIAGNOSIS — D869 Sarcoidosis, unspecified: Secondary | ICD-10-CM

## 2022-02-25 ENCOUNTER — Ambulatory Visit: Payer: Medicaid Other | Admitting: Nurse Practitioner

## 2022-02-25 ENCOUNTER — Encounter: Payer: Self-pay | Admitting: Nurse Practitioner

## 2022-02-25 ENCOUNTER — Encounter: Payer: Medicaid Other | Attending: Nurse Practitioner | Admitting: Nutrition

## 2022-02-25 VITALS — Ht 63.0 in | Wt 194.0 lb

## 2022-02-25 DIAGNOSIS — I10 Essential (primary) hypertension: Secondary | ICD-10-CM | POA: Diagnosis not present

## 2022-02-25 DIAGNOSIS — E1165 Type 2 diabetes mellitus with hyperglycemia: Secondary | ICD-10-CM | POA: Diagnosis not present

## 2022-02-25 DIAGNOSIS — Z794 Long term (current) use of insulin: Secondary | ICD-10-CM | POA: Diagnosis not present

## 2022-02-25 DIAGNOSIS — Z713 Dietary counseling and surveillance: Secondary | ICD-10-CM | POA: Insufficient documentation

## 2022-02-25 DIAGNOSIS — D869 Sarcoidosis, unspecified: Secondary | ICD-10-CM

## 2022-02-25 DIAGNOSIS — K582 Mixed irritable bowel syndrome: Secondary | ICD-10-CM

## 2022-02-25 DIAGNOSIS — E782 Mixed hyperlipidemia: Secondary | ICD-10-CM | POA: Diagnosis not present

## 2022-02-25 DIAGNOSIS — Z79899 Other long term (current) drug therapy: Secondary | ICD-10-CM | POA: Diagnosis not present

## 2022-02-25 LAB — POCT GLYCOSYLATED HEMOGLOBIN (HGB A1C): HbA1c, POC (controlled diabetic range): 6.4 % (ref 0.0–7.0)

## 2022-02-25 LAB — RHEUMATOID FACTOR: Rheumatoid fact SerPl-aCnc: <14 IU/mL (ref <14)

## 2022-02-25 LAB — CK: Total CK: 44 U/L (ref 29–143)

## 2022-02-25 LAB — C-REACTIVE PROTEIN: CRP: 5 mg/L (ref <8.0)

## 2022-02-25 LAB — SEDIMENTATION RATE: Sed Rate: 17 mm/h (ref 0–30)

## 2022-02-25 MED ORDER — NOVOLOG FLEXPEN 100 UNIT/ML ~~LOC~~ SOPN: 5.0000 [IU] | PEN_INJECTOR | Freq: Three times a day (TID) | SUBCUTANEOUS | 1 refills | Status: DC

## 2022-02-25 NOTE — Progress Notes (Signed)
? ?                                                    Endocrinology Consult Note  ?     02/25/2022, 12:02 PM ? ? ?Subjective:  ? ? Patient ID: Courtney Hale, female    DOB: [birth year].  ?Courtney Hale is being seen in consultation for management of currently uncontrolled symptomatic diabetes requested by  Coral Spikes, DO. ? ? ?Past Medical History:  ?Diagnosis Date  ? Complication of anesthesia   ? woke up during endoscopy and colonoscopy  ? Depression   ? Diabetes ([place])   ? Hemorrhoids   ? History of esophageal stricture   ? 2011  peptic w/  dilatation  ? History of gastritis   ? 2011  ? History of melanoma excision   ? 2015  left leg/   12-09-2015 back   ? Hypertension   ? Microhematuria   ? Migraines   ? Nephrolithiasis   ? left side non-obstructive   ? Right ureteral stone   ? Sarcoidosis 07/24/2021  ? Urgency of urination   ? Wears contact lenses   ? ? ?Past Surgical History:  ?Procedure Laterality Date  ? ABDOMINAL HYSTERECTOMY    ? BIOPSY  11/16/2021  ? Procedure: BIOPSY;  Surgeon: Eloise Harman, DO;  [facility];  Service: Endoscopy;;  ? CARPAL TUNNEL RELEASE Right 10/29/2013  ? COLONOSCOPY N/A 08/27/2015  ? Procedure: COLONOSCOPY;  Surgeon: Danie Binder, MD;  [facility];  Service: Endoscopy;  Laterality: N/A;  0830  ? COLONOSCOPY WITH PROPOFOL N/A 11/16/2021  ? Procedure: COLONOSCOPY WITH PROPOFOL;  Surgeon: Eloise Harman, DO;  [facility];  Service: Endoscopy;  Laterality: N/A;  2:00pm  ? CYST EXCISION  10/25/2010  ? right hand  ? CYSTOSCOPY W/ URETERAL STENT PLACEMENT Right 12/22/2015  ? Procedure: CYSTOSCOPY WITH RETROGRADE PYELOGRAM/URETERAL STENT PLACEMENT;  Surgeon: Nickie Retort, MD;  [facility];  Service: Urology;  Laterality: Right;  ? CYSTOSCOPY WITH RETROGRADE PYELOGRAM, URETEROSCOPY AND STENT PLACEMENT Right 01/05/2016  ? Procedure: CYSTOSCOPY WITH RETROGRADE PYELOGRAM, URETEROSCOPY AND STENT REPLACEMENT;  Surgeon:  Nickie Retort, MD;  Location: [facility];  Service: Urology;  Laterality: Right;  ? DILATION AND CURETTAGE OF UTERUS  03/12/2009  ? w/  Suction  ? double balloon enteroscopy    ? [facility] Arsenio Loader at [facility]: no erosions, no evidence of Crohn's disease, path without Crohn's.   ? EGD with push enteroscopy  02/12/2010  ?  patent distal peptic stricture with diffuse antral erythema, normal D1 and D2   ? ESOPHAGOGASTRODUODENOSCOPY (EGD) WITH PROPOFOL N/A 11/16/2021  ? Procedure: ESOPHAGOGASTRODUODENOSCOPY (EGD) WITH PROPOFOL;  Surgeon: Eloise Harman, DO;  [facility];  Service: Endoscopy;  Laterality: N/A;  ? HAND SURGERY    ? LAPAROSCOPIC ASSISTED VAGINAL HYSTERECTOMY  10/26/2014  ? w/  Bilateral Salpingoophorectomy  ? STONE EXTRACTION WITH BASKET Right 01/05/2016  ? Procedure: STONE EXTRACTION WITH BASKET;  Surgeon: Nickie Retort, MD;  Location[facility];  Service: Urology;  Laterality: Right;  ? TONSILLECTOMY  1998  approx  ? ? ?Social History  ? ?Socioeconomic History  ? Marital status: Single  ?  Spouse name: Not on file  ? Number of children: Not  on file  ? Years of education: Not on file  ? Highest education level: Not on file  ?Occupational History  ? Not on file  ?Tobacco Use  ? Smoking status: Never  ? Smokeless tobacco: Never  ?Vaping Use  ? Vaping Use: Never used  ?Substance and Sexual Activity  ? Alcohol use: Yes  ?  Comment: occassionally  ? Drug use: No  ? Sexual activity: Not on file  ?Other Topics Concern  ? Not on file  ?Social History Narrative  ? Not on file  ? ?Social Determinants of Health  ? ?Financial Resource Strain: Low Risk   ? Difficulty of Paying Living Expenses: Not hard at all  ?Food Insecurity: No Food Insecurity  ? Worried About Charity fundraiser in the Last Year: Never true  ? Ran Out of Food in the Last Year: Never true  ?Transportation Needs: No Transportation Needs  ? Lack of Transportation (Medical): No  ? Lack of  Transportation (Non-Medical): No  ?Physical Activity: Inactive  ? Days of Exercise per Week: 0 days  ? Minutes of Exercise per Session: 0 min  ?Stress: Stress Concern Present  ? Feeling of Stress : Very much  ?Social Connections: Socially Isolated  ? Frequency of Communication with Friends and Family: More than three times a week  ? Frequency of Social Gatherings with Friends and Family: More than three times a week  ? Attends Religious Services: Never  ? Active Member of Clubs or Organizations: No  ? Attends Archivist Meetings: Never  ? Marital Status: Never married  ? ? ?Family History  ?Problem Relation Age of Onset  ? Hypertension Mother   ? Thyroid disease Mother   ? Diabetes Mother   ? Hyperlipidemia Mother   ? Stroke Mother   ? Heart disease Mother   ? Hyperlipidemia Father   ? Hypertension Father   ? Heart attack Father   ? Heart failure Father   ? Colon cancer Neg Hx   ? ? ?Outpatient Encounter Medications as of 02/25/2022  ?Medication Sig  ? acetaminophen (TYLENOL) 500 MG tablet Take 1,000 mg by mouth every 6 (six) hours as needed for moderate pain.  ? albuterol (VENTOLIN HFA) 108 (90 Base) MCG/ACT inhaler Inhale 2 puffs into the lungs every 6 (six) hours as needed for wheezing or shortness of breath.  ? BD PEN NEEDLE NANO 2ND GEN 32G X 4 MM MISC USE AS DIRECTED  ? busPIRone (BUSPAR) 5 MG tablet TAKE 1 TABLET BY MOUTH THREE TIMES DAILY (Patient taking differently: Take 5 mg by mouth 3 (three) times daily as needed (anxiety).)  ? calcium carbonate (TUMS - DOSED IN MG ELEMENTAL CALCIUM) 500 MG chewable tablet Chew 1,500 mg by mouth 2 (two) times daily as needed for indigestion or heartburn.  ? Carboxymethylcellul-Glycerin (LUBRICATING EYE DROPS OP) Place 1 drop into both eyes daily as needed (dry eyes).  ? Chromium 1000 MCG TABS Take 1,000 mcg by mouth daily.  ? CINNAMON PO Take 2,000 mg by mouth daily.  ? Continuous Blood Gluc Receiver (FREESTYLE LIBRE 2 READER) DEVI Use as directed to check blood  sugars.  ? Continuous Blood Gluc Sensor (FREESTYLE LIBRE 2 SENSOR) MISC Use sensor as directed. Change every 10-14 days.  ? Cyanocobalamin (B-12) 2000 MCG TABS Take 2,000 mcg by mouth daily.  ? EPINEPHrine 0.3 mg/0.3 mL IJ SOAJ injection Inject 0.3 mg into the muscle as needed for anaphylaxis.  ? folic acid (FOLVITE) 1 MG tablet Take 2  tablets (2 mg total) by mouth daily. Take 1 tablet daily for 14 days, then increase to 2 tablets daily thereafter.  ? hydrochlorothiazide (HYDRODIURIL) 25 MG tablet Take 1 tablet (25 mg total) by mouth daily.  ? HYDROcodone-acetaminophen (NORCO/VICODIN) 5-325 MG tablet Take 1 tablet by mouth every 8 (eight) hours as needed for moderate pain.  ? insulin aspart (NOVOLOG FLEXPEN) 100 UNIT/ML FlexPen Inject 5-11 Units into the skin 3 (three) times daily with meals.  ? losartan (COZAAR) 100 MG tablet TAKE 1 TABLET BY MOUTH ONCE DAILY.  ? meclizine (ANTIVERT) 25 MG tablet Take 25 mg by mouth 3 (three) times daily as needed for dizziness.  ? metFORMIN (GLUCOPHAGE) 850 MG tablet Take 1 tablet (850 mg total) by mouth 2 (two) times daily with a meal.  ? methotrexate (RHEUMATREX) 2.5 MG tablet Take 8 tablets (20 mg total) by mouth once a week.  ? omeprazole (PRILOSEC) 40 MG capsule Take 1 capsule (40 mg total) by mouth 2 (two) times daily before a meal.  ? ondansetron (ZOFRAN-ODT) 4 MG disintegrating tablet Take 4 mg by mouth every 8 (eight) hours as needed for nausea or vomiting.  ? PARoxetine (PAXIL) 40 MG tablet Take 1 tablet (40 mg total) by mouth every morning.  ? Probiotic Product (PROBIOTIC PO) Take 1 capsule by mouth daily.  ? prochlorperazine (COMPAZINE) 10 MG tablet Take 1 tablet (10 mg total) by mouth every 6 (six) hours as needed for nausea or vomiting.  ? rizatriptan (MAXALT-MLT) 10 MG disintegrating tablet Take 1 tablet (10 mg total) by mouth as needed for migraine. May repeat in 2 hours if needed. Max 2 per 24 hours  ? SEMGLEE, YFGN, 100 UNIT/ML Pen SMARTSIG:35 Unit(s) SUB-Q Every  Night  ? traZODone (DESYREL) 100 MG tablet Take 1 tablet (100 mg total) by mouth at bedtime as needed for sleep.  ? valACYclovir (VALTREX) 500 MG tablet Take 500 mg by mouth 2 (two) times daily as needed Bettina Gavia

## 2022-02-25 NOTE — Progress Notes (Signed)
Medical Nutrition Therapy  Appointment Start time:  84  Appointment End time:  1200 Primary concerns today: DM Type 2  Referral diagnosis: E11.8 Preferred learning style. No preference  Learning readiness: Contemplating   NUTRITION ASSESSMENT  Changes made:  Has cut back on sweets and processed foods. Was on prednisone but is on methrotrexate. Stays nausea. She feels the steroids are causing her BS to be elevated. Diet still has room for improvement on her fruits, vegetables and whole grains. Needs to cut out diet sodas and eat more plant based foods. A1C down from 7.9% to 6.4%  Lab Results  Component Value Date   HGBA1C 6.4 02/25/2022    Anthropometrics  Wt Readings from Last 3 Encounters:  02/25/22 194 lb (88 kg)  02/24/22 191 lb (86.6 kg)  02/10/22 198 lb 3.2 oz (89.9 kg)   Ht Readings from Last 3 Encounters:  02/25/22 '5\' 3"'$  (1.6 m)  02/24/22 '5\' 3"'$  (1.6 m)  02/10/22 5' 2.5" (1.588 m)   There is no height or weight on file to calculate BMI. '@BMIFA'$ @ Facility age limit for growth percentiles is 20 years. Facility age limit for growth percentiles is 20 years.    Clinical Medical Hx: See chart Medications: Semglee 30 units at night, Novolog with meals 10 units with sliding scale, Metformin 850 mg BID. Labs:  Wt Readings from Last 3 Encounters:  02/25/22 194 lb (88 kg)  02/24/22 191 lb (86.6 kg)  02/10/22 198 lb 3.2 oz (89.9 kg)   Ht Readings from Last 3 Encounters:  02/25/22 '5\' 3"'$  (1.6 m)  02/24/22 '5\' 3"'$  (1.6 m)  02/10/22 5' 2.5" (1.588 m)   There is no height or weight on file to calculate BMI. '@BMIFA'$ @ Facility age limit for growth percentiles is 20 years. Facility age limit for growth percentiles is 20 years.  Lab Results  Component Value Date   HGBA1C 6.4 02/25/2022      Latest Ref Rng & Units 02/10/2022   11:52 AM 01/25/2022    1:07 PM 12/31/2021   11:05 AM  CMP  Glucose 70 - 99 mg/dL 156   147   176    BUN 6 - 23 mg/dL '9   10   15    '$ Creatinine 0.40  - 1.20 mg/dL 0.63   0.65   0.67    Sodium 135 - 145 mEq/L 138   140   138    Potassium 3.5 - 5.1 mEq/L 3.4   3.7   3.9    Chloride 96 - 112 mEq/L 101   108   103    CO2 19 - 32 mEq/L '27   23   26    '$ Calcium 8.4 - 10.5 mg/dL 9.1   9.2   8.9    Total Protein 6.0 - 8.3 g/dL 6.5   6.6   6.0    Total Bilirubin 0.2 - 1.2 mg/dL 0.5   0.5   0.5    Alkaline Phos 39 - 117 U/L 63   52   46    AST 0 - 37 U/L 34   23   17    ALT 0 - 35 U/L 45   31   35     Lipid Panel     Component Value Date/Time   CHOL 258 (H) 09/25/2021 1134   TRIG 169 (H) 09/25/2021 1134   HDL 59 09/25/2021 1134   CHOLHDL 4.4 09/25/2021 1134   LDLCALC 168 (H) 09/25/2021 1134   LABVLDL  31 09/25/2021 1134    Notable Signs/Symptoms: Increased thirst, frequent urination, fatigue  Lifestyle & Dietary Hx Has sarcoidosis and getting treatment for that. Lives with her husand.  Estimated daily fluid intake: 16 oz, 2 coke zeros Supplements:  Sleep: 3-4 hours. Stress / self-care: ilnness Current average weekly physical activity: ADL  24-Hr Dietary Recall First Meal:JImmy dean breakfast bowl Skips Chicken sandwich from [employer], Diet coke Dinner: 6pm skipped.   Estimated Energy Needs Calories: 1500 Carbohydrate: 170g Protein: 1122g Fat: 42g   NUTRITION DIAGNOSIS  NB-1.1 Food and nutrition-related knowledge deficit As related to Diabetes Type 2.  As evidenced by A1C 7.7%.   NUTRITION INTERVENTION  Nutrition education (E-1) on the following topics:  Nutrition and Diabetes education provided on My Plate, CHO counting, meal planning, portion sizes, timing of meals, avoiding snacks between meals unless having a low blood sugar, target ranges for A1C and blood sugars, signs/symptoms and treatment of hyper/hypoglycemia, monitoring blood sugars, taking medications as prescribed, benefits of exercising 30 minutes per day and prevention of complications of DM.  Lifestyle Medicine - Whole Food, Plant Predominant Nutrition is  highly recommended: Eat Plenty of vegetables, Mushrooms, fruits, Legumes, Whole Grains, Nuts, seeds in lieu of processed meats, processed snacks/pastries red meat, poultry, eggs.    -It is better to avoid simple carbohydrates including: Cakes, Sweet Desserts, Ice Cream, Soda (diet and regular), Sweet Tea, Candies, Chips, Cookies, Store Bought Juices, Alcohol in Excess of  1-2 drinks a day, Lemonade,  Artificial Sweeteners, Doughnuts, Coffee Creamers, "Sugar-free" Products, etc, etc.  This is not a complete list.....  Exercise: If you are able: 30 -60 minutes a day ,4 days a week, or 150 minutes a week.  The longer the better.  Combine stretch, strength, and aerobic activities.  If you were told in the past that you have high risk for cardiovascular diseases, you may seek evaluation by your heart doctor prior to initiating moderate to intense exercise programs.  Handouts Provided Include  Livestyle medicine meal plan Lifestyle nutrition Meal Plan Card  Learning Style & Readiness for Change Teaching method utilized: Visual & Auditory  Demonstrated degree of understanding via: Teach Back  Barriers to learning/adherence to lifestyle change: chemo treatments  Goals Established by Pt Eat three meals-plant based foods per day Eat meals on time Drink only water Avoid snacks Don't eat after 7 pm. Get A1C down to 7% or less Avoid hypoglcyemia.   MONITORING & EVALUATION Dietary intake, weekly physical activity, and blood sugars and weight in 1 month.  Next Steps  Patient is to work on meal planning, portion control and exercise.Marland Kitchen

## 2022-02-25 NOTE — Patient Instructions (Signed)
Diabetes Mellitus and Foot Care Foot care is an important part of your health, especially when you have diabetes. Diabetes may cause you to have problems because of poor blood flow (circulation) to your feet and legs, which can cause your skin to: Become thinner and drier. Break more easily. Heal more slowly. Peel and crack. You may also have nerve damage (neuropathy) in your legs and feet, causing decreased feeling in them. This means that you may not notice minor injuries to your feet that could lead to more serious problems. Noticing and addressing any potential problems early is the best way to prevent future foot problems. How to care for your feet Foot hygiene  Wash your feet daily with warm water and mild soap. Do not use hot water. Then, pat your feet and the areas between your toes until they are completely dry. Do not soak your feet as this can dry your skin. Trim your toenails straight across. Do not dig under them or around the cuticle. File the edges of your nails with an emery board or nail file. Apply a moisturizing lotion or petroleum jelly to the skin on your feet and to dry, brittle toenails. Use lotion that does not contain alcohol and is unscented. Do not apply lotion between your toes. Shoes and socks Wear clean socks or stockings every day. Make sure they are not too tight. Do not wear knee-high stockings since they may decrease blood flow to your legs. Wear shoes that fit properly and have enough cushioning. Always look in your shoes before you put them on to be sure there are no objects inside. To break in new shoes, wear them for just a few hours a day. This prevents injuries on your feet. Wounds, scrapes, corns, and calluses  Check your feet daily for blisters, cuts, bruises, sores, and redness. If you cannot see the bottom of your feet, use a mirror or ask someone for help. Do not cut corns or calluses or try to remove them with medicine. If you find a minor scrape,  cut, or break in the skin on your feet, keep it and the skin around it clean and dry. You may clean these areas with mild soap and water. Do not clean the area with peroxide, alcohol, or iodine. If you have a wound, scrape, corn, or callus on your foot, look at it several times a day to make sure it is healing and not infected. Check for: Redness, swelling, or pain. Fluid or blood. Warmth. Pus or a bad smell. General tips Do not cross your legs. This may decrease blood flow to your feet. Do not use heating pads or hot water bottles on your feet. They may burn your skin. If you have lost feeling in your feet or legs, you may not know this is happening until it is too late. Protect your feet from hot and cold by wearing shoes, such as at the beach or on hot pavement. Schedule a complete foot exam at least once a year (annually) or more often if you have foot problems. Report any cuts, sores, or bruises to your health care provider immediately. Where to find more information American Diabetes Association: www.diabetes.org Association of Diabetes Care & Education Specialists: www.diabeteseducator.org Contact a health care provider if: You have a medical condition that increases your risk of infection and you have any cuts, sores, or bruises on your feet. You have an injury that is not healing. You have redness on your legs or feet. You   feel burning or tingling in your legs or feet. You have pain or cramps in your legs and feet. Your legs or feet are numb. Your feet always feel cold. You have pain around any toenails. Get help right away if: You have a wound, scrape, corn, or callus on your foot and: You have pain, swelling, or redness that gets worse. You have fluid or blood coming from the wound, scrape, corn, or callus. Your wound, scrape, corn, or callus feels warm to the touch. You have pus or a bad smell coming from the wound, scrape, corn, or callus. You have a fever. You have a red  line going up your leg. Summary Check your feet every day for blisters, cuts, bruises, sores, and redness. Apply a moisturizing lotion or petroleum jelly to the skin on your feet and to dry, brittle toenails. Wear shoes that fit properly and have enough cushioning. If you have foot problems, report any cuts, sores, or bruises to your health care provider immediately. Schedule a complete foot exam at least once a year (annually) or more often if you have foot problems. This information is not intended to replace advice given to you by your health care provider. Make sure you discuss any questions you have with your health care provider. Document Revised: 05/01/2020 Document Reviewed: 05/01/2020 Elsevier Patient Education  2023 Elsevier Inc.  

## 2022-02-25 NOTE — Progress Notes (Signed)
Lab results all look fine. Tests for inflammation look pretty good okay to continue off any prednisone. We can follow up as planned about treatment options, I'll be reaching out to her other providers in the meantime.

## 2022-02-25 NOTE — Patient Instructions (Signed)
Goals ? ?Increase fresh fruits and vegetables. ?Eat three meals per day ?Focus on 80 oz of water per day ?Don't skip meals ?Avoid processed foods ?Walk as much as possible. ?Lose 2-3 lbs per month ? ?

## 2022-02-26 ENCOUNTER — Other Ambulatory Visit: Payer: Self-pay | Admitting: *Deleted

## 2022-02-26 MED ORDER — SEMGLEE (YFGN) 100 UNIT/ML ~~LOC~~ SOPN: PEN_INJECTOR | SUBCUTANEOUS | 0 refills | Status: DC

## 2022-03-01 ENCOUNTER — Ambulatory Visit (HOSPITAL_COMMUNITY): Payer: Medicaid Other

## 2022-03-02 ENCOUNTER — Telehealth: Payer: Self-pay | Admitting: Family Medicine

## 2022-03-02 NOTE — Telephone Encounter (Signed)
Please advise. Thank you

## 2022-03-02 NOTE — Telephone Encounter (Signed)
Patient is requesting refill on hydrocodone 5/325  30 count instead of 15 called into [facility] ?

## 2022-03-03 ENCOUNTER — Other Ambulatory Visit: Payer: Self-pay | Admitting: Family Medicine

## 2022-03-03 DIAGNOSIS — R52 Pain, unspecified: Secondary | ICD-10-CM | POA: Insufficient documentation

## 2022-03-03 MED ORDER — HYDROCODONE-ACETAMINOPHEN 5-325 MG PO TABS: 1.0000 | ORAL_TABLET | Freq: Four times a day (QID) | ORAL | 0 refills | Status: DC | PRN

## 2022-03-03 NOTE — Progress Notes (Signed)
Patient requesting refill on pain medication.  She has received 2 prior prescriptions for hydrocodone due to pain associated with sarcoidosis bone involvement. ?I have reviewed the [place] controlled substance database. ?Patient is compliant with therapy and uses pain medication sparingly. ?Sending in short supply of pain medication today. ? ?Thersa Salt DO ?[place] ? ?

## 2022-03-09 NOTE — Progress Notes (Unsigned)
Office Visit Note  Patient: Courtney Hale             Date of Birth: 23-Aug-1966           MRN: [id number]             PCP: Coral Spikes, DO Referring: Coral Spikes, DO Visit Date: 03/23/2022   Subjective:  Follow-up (No improvement)   History of Present Illness: Courtney Hale is a 56 y.o. female here for follow up for sarcoidosis on methotrexate 20 mg PO weekly. Pulmonary and osseus involvement with possible previous inflammatory eye disease and had nuclear medicine cardiac study earlier today looking for inflammation. Symptoms remain about the same left hip is hurting possibly worse than before continuing methotrexate as before. She notices somewhat increased left foot swelling and pain with walking. Labs at last visit were negative for serum inflammatory markers and RF.   Previous HPI 02/24/2022 Courtney Hale is a 56 y.o. 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 Symptoms started with dyspnea on exertion and then resting dyspnea leading to evaluation with numerous pulmonary nodules. Multiple bone lesions were present on imaging since at least 11/2020 found to be hypermetabolic on PET imaging and biopsy confirming noncaseating granulomas. She has history of previously resected melanomas but no concerning pathology. She is on prednisone since October 2022, methotrexate was started in February and prednisone tapering.  So far she has not noticed a significant difference in symptoms with any of these medications.  She has pretty much generalized body pains although hip problems are her worst.  Lab testing maintains normal renal function and no hypercalcemia. Ophthalmology exam showing glaucoma no uveitis or iritis.   Review of Systems  Constitutional:  Positive for fatigue.  HENT:  Positive for mouth dryness.   Eyes:  Positive for dryness.  Respiratory:  Positive for  shortness of breath.   Cardiovascular:  Positive for swelling in legs/feet.  Gastrointestinal:  Positive for constipation and diarrhea.  Endocrine: Positive for cold intolerance, heat intolerance, excessive thirst and increased urination.  Genitourinary:  Negative for difficulty urinating.  Musculoskeletal:  Positive for joint pain, gait problem, joint pain, joint swelling, muscle weakness, morning stiffness and muscle tenderness.  Skin:  Negative for rash.  Allergic/Immunologic: Negative for susceptible to infections.  Neurological:  Positive for numbness and weakness.  Hematological:  Negative for bruising/bleeding tendency.  Psychiatric/Behavioral:  Positive for sleep disturbance.    PMFS History:  Patient Active Problem List   Diagnosis Date Noted   Encounter for pain management 03/03/2022   Generalized pain 02/24/2022   High risk medication use 01/06/2022   Immunosuppression ([place]) 11/16/2021   Uncontrolled type 2 diabetes mellitus with hyperglycemia ([place]) 10/03/2021   Sarcoidosis 08/14/2021   Pain in left hip 03/09/2021   GERD (gastroesophageal reflux disease) 07/27/2017   Anxiety and depression 12/18/2016   IBS (irritable bowel syndrome) 03/18/2016   Melanoma of skin ([place]) 03/18/2016   Headache, chronic migraine without aura 01/31/2013   Hypertension 01/31/2013    Past Medical History:  Diagnosis Date   Complication of anesthesia    woke up during endoscopy and colonoscopy   Depression    Diabetes ([place])    Hemorrhoids    History of esophageal stricture    2011  peptic w/  dilatation   History of gastritis    2011   History of melanoma excision  2015  left leg/   12-09-2015 back    Hypertension    Microhematuria    Migraines    Nephrolithiasis    left side non-obstructive    Right ureteral stone    Sarcoidosis 07/24/2021   Urgency of urination    Wears contact lenses     Family History  Problem Relation Age of Onset   Hypertension Mother    Thyroid  disease Mother    Diabetes Mother    Hyperlipidemia Mother    Stroke Mother    Heart disease Mother    Hyperlipidemia Father    Hypertension Father    Heart attack Father    Heart failure Father    Colon cancer Neg Hx    Past Surgical History:  Procedure Laterality Date   ABDOMINAL HYSTERECTOMY     BIOPSY  11/16/2021   Procedure: BIOPSY;  Surgeon: Eloise Harman, DO;  [facility]: [facility];  Service: Endoscopy;;   CARPAL TUNNEL RELEASE Right 10/29/2013   COLONOSCOPY N/A 08/27/2015   Procedure: COLONOSCOPY;  Surgeon: Danie Binder, MD;  Location: [facility];  Service: Endoscopy;  Laterality: N/A;  0830   COLONOSCOPY WITH PROPOFOL N/A 11/16/2021   Procedure: COLONOSCOPY WITH PROPOFOL;  Surgeon: Eloise Harman, DO;  Location: [facility];  Service: Endoscopy;  Laterality: N/A;  2:00pm   CYST EXCISION  10/25/2010   right hand   CYSTOSCOPY W/ URETERAL STENT PLACEMENT Right 12/22/2015   Procedure: CYSTOSCOPY WITH RETROGRADE PYELOGRAM/URETERAL STENT PLACEMENT;  Surgeon: Nickie Retort, MD;  Location: [facility];  Service: Urology;  Laterality: Right;   CYSTOSCOPY WITH RETROGRADE PYELOGRAM, URETEROSCOPY AND STENT PLACEMENT Right 01/05/2016   Procedure: CYSTOSCOPY WITH RETROGRADE PYELOGRAM, URETEROSCOPY AND STENT REPLACEMENT;  Surgeon: Nickie Retort, MD;  Location: [facility];  Service: Urology;  Laterality: Right;   DILATION AND CURETTAGE OF UTERUS  03/12/2009   w/  Suction   double balloon enteroscopy     [facility]. Arsenio Loader at [facility]: no erosions, no evidence of Crohn's disease, path without Crohn's.    EGD with push enteroscopy  02/12/2010    patent distal peptic stricture with diffuse antral erythema, normal D1 and D2    ESOPHAGOGASTRODUODENOSCOPY (EGD) WITH PROPOFOL N/A 11/16/2021   Procedure: ESOPHAGOGASTRODUODENOSCOPY (EGD) WITH PROPOFOL;  Surgeon: Eloise Harman, DO;  [facility];  Service: Endoscopy;  Laterality: N/A;   HAND SURGERY      LAPAROSCOPIC ASSISTED VAGINAL HYSTERECTOMY  10/26/2014   w/  Bilateral Salpingoophorectomy   STONE EXTRACTION WITH BASKET Right 01/05/2016   Procedure: STONE EXTRACTION WITH BASKET;  Surgeon: Nickie Retort, MD;  Location[facility];  Service: Urology;  Laterality: Right;   TONSILLECTOMY  1998  approx   Social History   Social History Narrative   Not on file   Immunization History  Administered Date(s) Administered   Influenza,inj,Quad PF,6+ Mos 09/09/2016, 08/29/2017, 09/27/2019, 10/30/2020, 08/14/2021   Influenza-Unspecified 08/22/2013, 07/22/2014, 08/08/2015   MMR 01/22/2011   PFIZER(Purple Top)SARS-COV-2 Vaccination 12/30/2019, 01/20/2020   Tdap 01/22/2011, 04/17/2014     Objective: Vital Signs: BP 128/80 (BP Location: Right Arm, Patient Position: Sitting, Cuff Size: Normal)   Pulse 85   Resp 15   Ht 5' 3" (1.6 m)   Wt 195 lb (88.5 kg)   LMP 10/26/2014   BMI 34.54 kg/m    Physical Exam Constitutional:      Appearance: She is obese.  Cardiovascular:     Rate and Rhythm: Normal  rate and regular rhythm.  Pulmonary:     Effort: Pulmonary effort is normal.     Breath sounds: Normal breath sounds.  Musculoskeletal:     Comments: Trace pedal edema  Skin:    General: Skin is warm and dry.     Findings: No rash.  Neurological:     Mental Status: She is alert.  Psychiatric:        Mood and Affect: Mood normal.     Musculoskeletal Exam:  Tenderness to pressure at paraspinal muscles in upper and lower back and pain between scapula Elbows full ROM no tenderness or swelling Wrists full ROM no tenderness or swelling Fingers full ROM no tenderness or swelling Lateral hip tenderness to pressure more on left side Patellofemoral crepitus of bilateral knees full range of motion Left ankle and midfoot tenderness, trace overlying edema no discoloration  Investigation: No additional findings.  Imaging: No results found.  Recent Labs: Lab Results   Component Value Date   WBC 7.7 02/10/2022   HGB 12.6 02/10/2022   PLT 312.0 02/10/2022   NA 138 02/10/2022   K 3.4 (L) 02/10/2022   CL 101 02/10/2022   CO2 27 02/10/2022   GLUCOSE 156 (H) 02/10/2022   BUN 9 02/10/2022   CREATININE 0.63 02/10/2022   BILITOT 0.5 02/10/2022   ALKPHOS 63 02/10/2022   AST 34 02/10/2022   ALT 45 (H) 02/10/2022   PROT 6.5 02/10/2022   ALBUMIN 4.2 02/10/2022   CALCIUM 9.1 02/10/2022   GFRAA 121 08/27/2020   QFTBGOLDPLUS Negative 11/27/2021    Speciality Comments: No specialty comments available.  Procedures:  No procedures performed Allergies: Topamax [topiramate], Fluorouracil, and Sulfa antibiotics   Assessment / Plan:     Visit Diagnoses: Sarcoidosis  Pulmonary disease and osseous lesions with granuloma and suspected articular involvement.  Nuclear medicine cardiac study was checked negative for evidence of cardiac sarcoidosis involvement.  I still think adding to treatment is reasonable since a lot of the original symptom complaints have not improved.  Humira 40 mg subcu q. 14 days to current methotrexate 20 mg p.o. weekly and folic acid 1 mg daily.  High risk medication use   She has a history of multiple melanomas, which risk may be increased with TNF inhibitors although typically associated with nonmelanoma skin cancers.  She has close follow-up with her dermatologist.  Has recent labs reviewed with slight ALT elevation 45 so checking liver function test for baseline anticipating starting Humira.  Also reviewed again risk for side effects such as injection site reactions or increased infections.  Pain in left hip Generalized pain  Hip pain worst area though has some degree of generalized complaints.  Lack of response to prednisone treatment but will need to see how much this improves or not with alternate sarcoidosis treatment.  Orders: Orders Placed This Encounter  Procedures   Hepatic function panel   No orders of the defined types  were placed in this encounter.    Follow-Up Instructions: Return in about 2 months (around 05/23/2022) for Sarcoidosis on MTX/ADA start f/u 43mo.   CCollier Salina MD  Note - This record has been created using [employer]  Chart creation errors have been sought, but may not always  have been located. Such creation errors do not reflect on  the standard of medical care.

## 2022-03-17 ENCOUNTER — Ambulatory Visit: Payer: Medicaid Other | Admitting: Gastroenterology

## 2022-03-19 ENCOUNTER — Telehealth (HOSPITAL_COMMUNITY): Payer: Self-pay | Admitting: Emergency Medicine

## 2022-03-19 NOTE — Telephone Encounter (Signed)
Reaching out to patient to offer assistance regarding upcoming cardiac imaging study; pt verbalizes understanding of appt date/time, parking situation and where to check in, pre-test NPO status and medications ordered, and verified current allergies; name and call back number provided for further questions should they arise Courtney Bond RN Navigator Cardiac Imaging [facility] [phone] office [phone] cell  Denies iv issues Denies claustro Denies metal implants Arrival [id number]

## 2022-03-23 ENCOUNTER — Encounter: Payer: Self-pay | Admitting: Internal Medicine

## 2022-03-23 ENCOUNTER — Ambulatory Visit (HOSPITAL_COMMUNITY)
Admission: RE | Admit: 2022-03-23 | Discharge: 2022-03-23 | Disposition: A | Discharge status: Home / Self Care | Payer: Medicaid Other | Source: Ambulatory Visit | Attending: Pulmonary Disease | Admitting: Pulmonary Disease

## 2022-03-23 ENCOUNTER — Ambulatory Visit: Payer: Medicaid Other | Admitting: Internal Medicine

## 2022-03-23 VITALS — BP 128/80 | HR 85 | Resp 15 | Ht 63.0 in | Wt 195.0 lb

## 2022-03-23 DIAGNOSIS — Z79899 Other long term (current) drug therapy: Secondary | ICD-10-CM

## 2022-03-23 DIAGNOSIS — D869 Sarcoidosis, unspecified: Secondary | ICD-10-CM

## 2022-03-23 DIAGNOSIS — R52 Pain, unspecified: Secondary | ICD-10-CM | POA: Diagnosis not present

## 2022-03-23 DIAGNOSIS — M25552 Pain in left hip: Secondary | ICD-10-CM | POA: Diagnosis not present

## 2022-03-23 MED ORDER — GADOBUTROL 1 MMOL/ML IV SOLN
9.0000 mL | Freq: Once | INTRAVENOUS | Status: AC | PRN
  Administered 2022-03-23: 9 mL via INTRAVENOUS

## 2022-03-23 NOTE — Patient Instructions (Signed)
Adalimumab Injection What is this medication? ADALIMUMAB (ay da LIM yoo mab) is used to treat rheumatoid and psoriatic arthritis. It is also used to treat ankylosing spondylitis, Crohn's disease, ulcerative colitis, plaque psoriasis, hidradenitis suppurativa, and uveitis. This medicine may be used for other purposes; ask your health care provider or pharmacist if you have questions. COMMON BRAND NAME(S): AMJEVITA, Humira What should I tell my care team before I take this medication? They need to know if you have any of these conditions: cancer diabetes (high blood sugar) having surgery heart disease hepatitis B immune system problems infections, such as tuberculosis (TB) or other bacterial, fungal, or viral infections multiple sclerosis recent or upcoming vaccine an unusual reaction to adalimumab, mannitol, latex, rubber, other medicines, foods, dyes, or preservatives pregnant or trying to get pregnant breast-feeding How should I use this medication? This medicine is for injection under the skin. You will be taught how to prepare and give it. Take it as directed on the prescription label. Keep taking it unless your health care provider tells you to stop. It is important that you put your used needles and syringes in a special sharps container. Do not put them in a trash can. If you do not have a sharps container, call your pharmacist or health care provider to get one. This medicine comes with INSTRUCTIONS FOR USE. Ask your pharmacist for directions on how to use this medicine. Read the information carefully. Talk to your pharmacist or health care provider if you have questions. A special MedGuide will be given to you by the pharmacist with each prescription and refill. Be sure to read this information carefully each time. Talk to your pediatrician regarding the use of this medicine in children. While this drug may be prescribed for children as young as 2 years for selected conditions,  precautions do apply. Overdosage: If you think you have taken too much of this medicine contact a poison control center or emergency room at once. NOTE: This medicine is only for you. Do not share this medicine with others. What if I miss a dose? If you miss a dose, take it as soon as you can. If it is almost time for your next dose, take only that dose. Do not take double or extra doses. It is important not to miss any doses. Talk to your health care provider about what to do if you miss a dose. What may interact with this medication? Do not take this medicine with any of the following medications: abatacept anakinra biologic medicines such as certolizumab, etanercept, golimumab, infliximab live virus vaccines This medicine may also interact with the following medications: cyclosporine theophylline vaccines warfarin This list may not describe all possible interactions. Give your health care provider a list of all the medicines, herbs, non-prescription drugs, or dietary supplements you use. Also tell them if you smoke, drink alcohol, or use illegal drugs. Some items may interact with your medicine. What should I watch for while using this medication? Visit your health care provider for regular checks on your progress. Tell your health care provider if your symptoms do not start to get better or if they get worse. You will be tested for tuberculosis (TB) before you start this medicine. If your doctor prescribes any medicine for TB, you should start taking the TB medicine before starting this medicine. Make sure to finish the full course of TB medicine. This medicine may increase your risk of getting an infection. Call your health care provider for advice if you  get a fever, chills, sore throat, or other symptoms of a cold or flu. Do not treat yourself. Try to avoid being around people who are sick. Talk to your health care provider about your risk of cancer. You may be more at risk for certain  types of cancer if you take this medicine. What side effects may I notice from receiving this medication? Side effects that you should report to your doctor or health care professional as soon as possible: allergic reactions like skin rash, itching or hives, swelling of the face, lips, or tongue changes in vision chest pain dizziness heart failure (trouble breathing; fast, irregular heartbeat; sudden weight gain; swelling of the ankles, feet, hands; unusually weak or tired) infection (fever, chills, cough, sore throat, pain or trouble passing urine) liver injury (dark yellow or brown urine; general ill feeling or flu-like symptoms; loss of appetite, right upper belly pain; unusually weak or tired, yellowing of the eyes or skin) lump or swollen lymph nodes on the neck, groin, or underarm area muscle weakness pain, tingling, numbness in the hands or feet red, scaly patches or raised bumps on the skin trouble breathing unusual bleeding or bruising unusually weak or tired Side effects that usually do not require medical attention (report to your doctor or health care professional if they continue or are bothersome): headache nausea pain, redness, or irritation at site where injected stuffy or runny nose This list may not describe all possible side effects. Call your doctor for medical advice about side effects. You may report side effects to FDA at 1-800-FDA-1088. Where should I keep my medication? Keep out of the reach of children and pets. Store in the refrigerator between 2 and 8 degrees C (36 and 46 degrees F). Do not freeze. Keep this medicine in the original packaging until you are ready to take it. Protect from light. Get rid of any unused medicine after the expiration date. This medicine may be stored at room temperature for up to 14 days. Keep this medicine in the original packaging. Protect from light. If it is stored at room temperature, get rid of any unused medicine after 14 days  or after it expires, whichever is first. To get rid of medicines that are no longer needed or have expired: Take the medicine to a medicine take-back program. Check with your pharmacy or law enforcement to find a location. If you cannot return the medicine, ask your pharmacist or health care provider how to get rid of this medicine safely. NOTE: This sheet is a summary. It may not cover all possible information. If you have questions about this medicine, talk to your doctor, pharmacist, or health care provider.  2023 Elsevier/Gold Standard (2021-09-11 00:00:00)

## 2022-03-24 ENCOUNTER — Emergency Department (HOSPITAL_COMMUNITY): Payer: Medicaid Other

## 2022-03-24 ENCOUNTER — Other Ambulatory Visit: Payer: Self-pay

## 2022-03-24 ENCOUNTER — Emergency Department (HOSPITAL_COMMUNITY)
Admission: EM | Admit: 2022-03-24 | Discharge: 2022-03-25 | Disposition: A | Discharge status: Home / Self Care | Payer: Medicaid Other | Attending: Emergency Medicine | Admitting: Emergency Medicine

## 2022-03-24 ENCOUNTER — Telehealth: Payer: Self-pay | Admitting: Pharmacist

## 2022-03-24 ENCOUNTER — Encounter (HOSPITAL_COMMUNITY): Payer: Self-pay

## 2022-03-24 DIAGNOSIS — M7989 Other specified soft tissue disorders: Secondary | ICD-10-CM | POA: Diagnosis not present

## 2022-03-24 DIAGNOSIS — E119 Type 2 diabetes mellitus without complications: Secondary | ICD-10-CM | POA: Diagnosis not present

## 2022-03-24 DIAGNOSIS — I10 Essential (primary) hypertension: Secondary | ICD-10-CM | POA: Insufficient documentation

## 2022-03-24 DIAGNOSIS — M25572 Pain in left ankle and joints of left foot: Secondary | ICD-10-CM | POA: Insufficient documentation

## 2022-03-24 LAB — HEPATIC FUNCTION PANEL
AG Ratio: 2 (calc) (ref 1.0–2.5)
ALT: 29 U/L (ref 6–29)
AST: 29 U/L (ref 10–35)
Albumin: 3.9 g/dL (ref 3.6–5.1)
Alkaline phosphatase (APISO): 63 U/L (ref 37–153)
Bilirubin, Direct: 0.1 mg/dL (ref 0.0–0.2)
Globulin: 2 g/dL (calc) (ref 1.9–3.7)
Indirect Bilirubin: 0.3 mg/dL (calc) (ref 0.2–1.2)
Total Bilirubin: 0.4 mg/dL (ref 0.2–1.2)
Total Protein: 5.9 g/dL — ABNORMAL LOW (ref 6.1–8.1)

## 2022-03-24 NOTE — ED Triage Notes (Signed)
Pt presents with right foot pain that started x1 week ago. Denies injury to foot. Pt can not see PCP until the 18th. Swelling noted to R foot. Pt ambulatory to triage.

## 2022-03-24 NOTE — Progress Notes (Signed)
Liver function is normal no problem for starting Humira as planned.

## 2022-03-24 NOTE — Telephone Encounter (Addendum)
Humira BIV pending OV note to be signed  Dose: '40mg'$  SQ every 14 days  Dx: sarcoidosis (D86.9)  Current regimen: MTX '20mg'$  weekly (started February 2023) + prednisone  Once approved, will need new start visit  Knox Saliva, PharmD, MPH, BCPS, CPP Clinical Pharmacist (Rheumatology and Pulmonology)  ----- Message from Shona Needles, RT sent at 03/23/2022  2:40 PM EDT ----- Regarding: NEW START HUMIRA FYI - new start Humira.

## 2022-03-25 ENCOUNTER — Other Ambulatory Visit: Payer: Self-pay | Admitting: Family Medicine

## 2022-03-25 ENCOUNTER — Emergency Department (HOSPITAL_COMMUNITY): Payer: Medicaid Other

## 2022-03-25 DIAGNOSIS — F419 Anxiety disorder, unspecified: Secondary | ICD-10-CM

## 2022-03-25 DIAGNOSIS — M7989 Other specified soft tissue disorders: Secondary | ICD-10-CM | POA: Diagnosis not present

## 2022-03-25 DIAGNOSIS — F32A Depression, unspecified: Secondary | ICD-10-CM

## 2022-03-25 MED ORDER — NAPROXEN 500 MG PO TABS: 500.0000 mg | ORAL_TABLET | Freq: Two times a day (BID) | ORAL | 0 refills | Status: DC

## 2022-03-25 NOTE — Telephone Encounter (Signed)
Received a fax regarding Prior Authorization from  [facility]  for [place]. Authorization has been DENIED : .  Phone# [phone]

## 2022-03-25 NOTE — Discharge Instructions (Signed)
You were evaluated in the Emergency Department and after careful evaluation, we did not find any emergent condition requiring admission or further testing in the hospital.  Your exam/testing today is overall reassuring.  X-rays did not show any abnormalities.  Symptoms seem to be due to an inflamed tendon.  Take the Naprosyn anti-inflammatory as directed and follow-up with your regular doctors.  Please return to the Emergency Department if you experience any worsening of your condition.   Thank you for allowing [place] to be a part of your care.

## 2022-03-25 NOTE — ED Provider Notes (Signed)
[facility] Emergency Department Provider Note MRN:  [id number]  Arrival date & time: 03/25/22     Chief Complaint   For pain History of Present Illness   Courtney Hale is a 56 y.o. year-old female with a history of sarcoidosis presenting to the ED with chief complaint of foot pain.  Pain to the left foot ankle and lower leg for the past several days.  Worse with motion or palpation.  Explains that she has a history of sarcoidosis with bony lesions.  Denies fever, no numbness or weakness, no rash, no other complaints.  Review of Systems  A thorough review of systems was obtained and all systems are negative except as noted in the HPI and PMH.   Patient's Health History    Past Medical History:  Diagnosis Date   Complication of anesthesia    woke up during endoscopy and colonoscopy   Depression    Diabetes ([place])    Hemorrhoids    History of esophageal stricture    2011  peptic w/  dilatation   History of gastritis    2011   History of melanoma excision    2015  left leg/   12-09-2015 back    Hypertension    Microhematuria    Migraines    Nephrolithiasis    left side non-obstructive    Right ureteral stone    Sarcoidosis 07/24/2021   Urgency of urination    Wears contact lenses     Past Surgical History:  Procedure Laterality Date   ABDOMINAL HYSTERECTOMY     BIOPSY  11/16/2021   Procedure: BIOPSY;  Surgeon: Eloise Harman, DO;  [facility];  Service: Endoscopy;;   CARPAL TUNNEL RELEASE Right 10/29/2013   COLONOSCOPY N/A 08/27/2015   Procedure: COLONOSCOPY;  Surgeon: Danie Binder, MD;  Location: [facility];  Service: Endoscopy;  Laterality: N/A;  0830   COLONOSCOPY WITH PROPOFOL N/A 11/16/2021   Procedure: COLONOSCOPY WITH PROPOFOL;  Surgeon: Eloise Harman, DO;  Location: [facility];  Service: Endoscopy;  Laterality: N/A;  2:00pm   CYST EXCISION  10/25/2010   right hand   CYSTOSCOPY W/ URETERAL STENT PLACEMENT  Right 12/22/2015   Procedure: CYSTOSCOPY WITH RETROGRADE PYELOGRAM/URETERAL STENT PLACEMENT;  Surgeon: Nickie Retort, MD;  Location: [facility];  Service: Urology;  Laterality: Right;   CYSTOSCOPY WITH RETROGRADE PYELOGRAM, URETEROSCOPY AND STENT PLACEMENT Right 01/05/2016   Procedure: CYSTOSCOPY WITH RETROGRADE PYELOGRAM, URETEROSCOPY AND STENT REPLACEMENT;  Surgeon: Nickie Retort, MD;  Location: [facility];  Service: Urology;  Laterality: Right;   DILATION AND CURETTAGE OF UTERUS  03/12/2009   w/  Suction   double balloon enteroscopy     Dr. Arsenio Loader at [facility]: no erosions, no evidence of Crohn's disease, path without Crohn's.    EGD with push enteroscopy  02/12/2010    patent distal peptic stricture with diffuse antral erythema, normal D1 and D2    ESOPHAGOGASTRODUODENOSCOPY (EGD) WITH PROPOFOL N/A 11/16/2021   Procedure: ESOPHAGOGASTRODUODENOSCOPY (EGD) WITH PROPOFOL;  Surgeon: Eloise Harman, DO;  [facility];  Service: Endoscopy;  Laterality: N/A;   HAND SURGERY     LAPAROSCOPIC ASSISTED VAGINAL HYSTERECTOMY  10/26/2014   w/  Bilateral Salpingoophorectomy   STONE EXTRACTION WITH BASKET Right 01/05/2016   Procedure: STONE EXTRACTION WITH BASKET;  Surgeon: Nickie Retort, MD;  Location: [facility];  Service: Urology;  Laterality: Right;   TONSILLECTOMY  1998  approx  Family History  Problem Relation Age of Onset   Hypertension Mother    Thyroid disease Mother    Diabetes Mother    Hyperlipidemia Mother    Stroke Mother    Heart disease Mother    Hyperlipidemia Father    Hypertension Father    Heart attack Father    Heart failure Father    Colon cancer Neg Hx     Social History   Socioeconomic History   Marital status: Single    Spouse name: Not on file   Number of children: Not on file   Years of education: Not on file   Highest education level: Not on file  Occupational History   Not on file  Tobacco Use    Smoking status: Never   Smokeless tobacco: Never  Vaping Use   Vaping Use: Never used  Substance and Sexual Activity   Alcohol use: Yes    Comment: occassionally   Drug use: No   Sexual activity: Not on file  Other Topics Concern   Not on file  Social History Narrative   Not on file   Social Determinants of Health   Financial Resource Strain: Low Risk    Difficulty of Paying Living Expenses: Not hard at all  Food Insecurity: No Food Insecurity   Worried About Charity fundraiser in the Last Year: Never true   [place] in the Last Year: Never true  Transportation Needs: No Transportation Needs   Lack of Transportation (Medical): No   Lack of Transportation (Non-Medical): No  Physical Activity: Inactive   Days of Exercise per Week: 0 days   Minutes of Exercise per Session: 0 min  Stress: Stress Concern Present   Feeling of Stress : Very much  Social Connections: Socially Isolated   Frequency of Communication with Friends and Family: More than three times a week   Frequency of Social Gatherings with Friends and Family: More than three times a week   Attends Religious Services: Never   Marine scientist or Organizations: No   Attends Archivist Meetings: Never   Marital Status: Never married  Human resources officer Violence: Not At Risk   Fear of Current or Ex-Partner: No   Emotionally Abused: No   Physically Abused: No   Sexually Abused: No     Physical Exam   Vitals:   03/24/22 2034 03/25/22 0000  BP: (!) 142/120 132/87  Pulse: 73 (!) 56  Resp: 18 18  Temp: 99.3 F (37.4 C)   SpO2: 98% 97%    CONSTITUTIONAL: Well-appearing, NAD NEURO/PSYCH:  Alert and oriented x 3, no focal deficits EYES:  eyes equal and reactive ENT/NECK:  no LAD, no JVD CARDIO: Regular rate, well-perfused, normal S1 and S2 PULM:  CTAB no wheezing or rhonchi GI/GU:  non-distended, non-tender MSK/SPINE:  No gross deformities, no edema SKIN:  no rash, some mild bruising and  tenderness to the medial aspect of the left foot, ankle   *Additional and/or pertinent findings included in MDM below  Diagnostic and Interventional Summary    EKG Interpretation  Date/Time:    Ventricular Rate:    PR Interval:    QRS Duration:   QT Interval:    QTC Calculation:   R Axis:     Text Interpretation:         Labs Reviewed - No data to display  DG Foot Complete Left  Final Result    DG Tibia/Fibula Left  Final Result  DG Ankle Complete Left  Final Result      Medications - No data to display   Procedures  /  Critical Care Procedures  ED Course and Medical Decision Making  Initial Impression and Ddx Patient denies trauma or inciting event, overall suspicious for sprain or traumatic etiology.  Other consideration would be bony lesion related to patient's sarcoidosis.  Obtaining screening x-rays, anticipating discharge  Past medical/surgical history that increases complexity of ED encounter: Sarcoidosis  Interpretation of Diagnostics I personally reviewed the foot x-ray and my interpretation is as follows: No obvious fracture    Patient Reassessment and Ultimate Disposition/Management     Discharge home  Patient management required discussion with the following services or consulting groups:  None  Complexity of Problems Addressed Acute complicated illness or Injury  Additional Data Reviewed and Analyzed Further history obtained from: None  Additional Factors Impacting ED Encounter Risk Prescriptions  Barth Kirks. Sedonia Small, [place] [email or web address]  Final Clinical Impressions(s) / ED Diagnoses     ICD-10-CM   1. Acute left ankle pain  M25.572       ED Discharge Orders          Ordered    naproxen (NAPROSYN) 500 MG tablet  2 times daily        03/25/22 0127             Discharge Instructions Discussed with and Provided to Patient:     Discharge Instructions       You were evaluated in the Emergency Department and after careful evaluation, we did not find any emergent condition requiring admission or further testing in the hospital.  Your exam/testing today is overall reassuring.  X-rays did not show any abnormalities.  Symptoms seem to be due to an inflamed tendon.  Take the Naprosyn anti-inflammatory as directed and follow-up with your regular doctors.  Please return to the Emergency Department if you experience any worsening of your condition.   Thank you for allowing [place] to be a part of your care.       Maudie Flakes, MD 03/25/22 [phone]

## 2022-03-25 NOTE — ED Notes (Signed)
Patient verbalizes understanding of discharge instructions. Opportunity for questioning and answers were provided. Armband removed by staff, pt discharged from ED. Ambulated out to lobby  

## 2022-03-25 NOTE — Telephone Encounter (Signed)
Submitted a Prior Authorization request to  [facility]  for [place] via CoverMyMeds. Will update once we receive a response.   Key: [id number] - PA Case ID: [id number]

## 2022-03-31 ENCOUNTER — Other Ambulatory Visit: Payer: Self-pay | Admitting: Family Medicine

## 2022-03-31 DIAGNOSIS — G4700 Insomnia, unspecified: Secondary | ICD-10-CM

## 2022-04-02 NOTE — Telephone Encounter (Addendum)
Submitted an URGENT appeal to  [employer]  for [place] on 03/31/22  Phone: [phone] Fax: [phone] [place] Case #: [id number]  Knox Saliva, PharmD, MPH, BCPS, CPP Clinical Pharmacist (Rheumatology and Pulmonology)

## 2022-04-05 ENCOUNTER — Ambulatory Visit: Payer: Medicaid Other | Admitting: Orthopedic Surgery

## 2022-04-05 VITALS — BP 109/76 | HR 85 | Ht 63.0 in | Wt 188.0 lb

## 2022-04-05 DIAGNOSIS — M76822 Posterior tibial tendinitis, left leg: Secondary | ICD-10-CM | POA: Diagnosis not present

## 2022-04-05 DIAGNOSIS — M76829 Posterior tibial tendinitis, unspecified leg: Secondary | ICD-10-CM | POA: Diagnosis not present

## 2022-04-05 MED ORDER — NAPROXEN 500 MG PO TABS
500.0000 mg | ORAL_TABLET | Freq: Two times a day (BID) | ORAL | 1 refills | Status: DC
Start: 2022-04-05 — End: 2022-07-15

## 2022-04-05 NOTE — Addendum Note (Signed)
Addended by: Carole Civil on: 04/05/2022 05:59 PM   Modules accepted: Level of Service

## 2022-04-05 NOTE — Progress Notes (Addendum)
Chief Complaint  Patient presents with   Foot Pain    Left no injury   Ankle Pain    Left   56 year old female history of subtalar pain treated with injections with good result  Presents with a 3-week history of pain medial side of her left foot and ankle radiating up the back of the left tibia associated with inability to bear weight requiring her to go to the ER  She was placed on Naprosyn which she says did help  Examination of the left foot and ankle  Single-leg heel raise is painful and poor result with tenderness in the posterior tibial tendon no weakness painful abduction of the subtalar joint normal flexion extension  Outside imaging was performed of the tibia and fibula, foot and ankle  My independent interpretation is that the tib-fib x-ray was normal the foot and ankle x-rays were both normal   Encounter Diagnosis  Name Primary?   PTTD (posterior tibial tendon dysfunction) left  Yes    Recommend cam walker for 6 weeks  Naproxen  Meds ordered this encounter  Medications   naproxen (NAPROSYN) 500 MG tablet    Sig: Take 1 tablet (500 mg total) by mouth 2 (two) times daily.    Dispense:  60 tablet    Refill:  1    Return to 6 weeks

## 2022-04-05 NOTE — Patient Instructions (Signed)
Ice baths 20 min   Naproxen continue   Wear boot for 6 weeks

## 2022-04-08 ENCOUNTER — Other Ambulatory Visit: Payer: Self-pay | Admitting: Family Medicine

## 2022-04-08 NOTE — Telephone Encounter (Signed)
Called [employer] for an update on the appeal submitted for HUMIRA on 03/31/22. Per rep, appeal has been DENIED as of 04/06/22.   Phone # [phone] Case # [id number] Call reference # [id number]  The second level appeal is a State Fair Hearing, the last day to ask for this is 08/05/22. The Office of Administrative Hearing can be called at [phone] to request this hearing. Patient must provide written permission to move forward with this. Appeal decision letter is in media tab of patient's chart. Routing to provider.   Rodolph Bong, PharmD Candidate 04/08/2022 11:46 AM

## 2022-04-12 NOTE — Telephone Encounter (Signed)
I don't have any particular new information or supporting data compared to initial appeal so probably not.

## 2022-04-12 NOTE — Telephone Encounter (Signed)
Called patient to inform her that the Humira appeal was unfortunately denied due to Humira not being FDA approved nor studied in sarcoidosis without eye involvement. She is to continue methotrexate. She has been able to come off prednisone. Patient will keep her appointment on 06/20/22.   Joseph Art, Pharm.D. PGY-1 Pharmacy Resident 04/12/2022 8:56 AM

## 2022-04-13 NOTE — Progress Notes (Unsigned)
Primary Care Physician:  Coral Spikes, DO  Primary GI: Dr. Abbey Chatters  Patient Location: Home   Provider Location: [facility] office   Reason for Visit: Follow-up   Persons present on the virtual encounter, with roles: [name], PA-C (Provider), Courtney Hale (patient)   Total time (minutes) spent on medical discussion: 30 minutes  Virtual Visit via video Note Due to COVID-19, visit is conducted virtually and was requested by patient.   I connected with Courtney Hale on 04/14/22 at 11:30 AM EDT by video and verified that I am speaking with the correct person using two identifiers.   I discussed the limitations, risks, security and privacy concerns of performing an evaluation and management service by video and the availability of in person appointments. I also discussed with the patient that there may be a patient responsible charge related to this service. The patient expressed understanding and agreed to proceed.  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   Last seen in our office 11/05/2021.  Reported new onset LLQ abdominal pain, different from prior abdominal pain.  Also with rectal bleeding and she thought her hemorrhoids were acting up.  Also reported uncontrolled reflux, recently started on omeprazole, but still having breakthrough symptoms.  Associated epigastric pain.  Omeprazole was increased to twice daily, and she was scheduled for EGD and colonoscopy  Procedures completed 11/16/2021: Colonoscopy: Nonbleeding internal hemorrhoids, otherwise normal exam.  Recommended 10-year screening colonoscopy.   Consider hemorrhoid banding if bleeding continues. EGD: Gastritis biopsied, otherwise normal exam.  Pathology with mild chronic gastritis, negative for H. pylori.  Today:   GERD as improved, but continues with reflux a couple times a week on omeprazole 40 mg BID. Has never been on any other PPIs  Feels like foods and pills are getting hung in her esophagus. Daily symptoms for the last couple of months. Occasional regurgitation.   Continues with intermittent epigastric discomfort. Slight improvement with omeprazole BID. Can be triggered by meals, but not always. It is intermittent. Sometimes sharp, sometimes dull. Daily nausea for the last few months without vomiting. Taking Zofran. Some early satiety. Doesn't have a great appetite. Has been losing weight. Reports weight of 188 lbs today, weighed 194 at last visit. She isn't sure if methotraxate may be contributing to her nausea. Taking methotrexate every Monday, but nausea is every day.   Reports last A1C 6.4.   Just started naproxen Monday last week for tendonitis left foot. Taking once daily.    Still having trouble with hemorrhoids. Rectal burning/itching and intermittent bleeding. Blood is in the water. No sharp knife like pain. Using OTC hemorrhoid cream couple times a day when she flares. Flares occur once a month lasting 4-5 days. Interested in banding.    LLQ abdominal pain:  Improved, but still tender in LLQ. Only every now and then. Having some issues with constipation where she is skipping 2-3 days. Hasn't taken anything.   Past Medical History:  Diagnosis Date   Complication of anesthesia    woke up during endoscopy and colonoscopy   Depression    Diabetes ([place])    GERD (gastroesophageal reflux disease)  Hemorrhoids    History of esophageal stricture    2011  peptic w/  dilatation   History of gastritis    2011   History of melanoma excision    2015  left leg/   12-09-2015 back    Hypertension    Microhematuria     Migraines    Nephrolithiasis    left side non-obstructive    Right ureteral stone    Sarcoidosis 07/24/2021   Urgency of urination    Wears contact lenses      Past Surgical History:  Procedure Laterality Date   ABDOMINAL HYSTERECTOMY     BIOPSY  11/16/2021   Procedure: BIOPSY;  Surgeon: Eloise Harman, DO;  Location: [facility];  Service: Endoscopy;;   CARPAL TUNNEL RELEASE Right 10/29/2013   COLONOSCOPY N/A 08/27/2015   Procedure: COLONOSCOPY;  Surgeon: Danie Binder, MD;  Location: [facility];  Service: Endoscopy;  Laterality: N/A;  0830   COLONOSCOPY WITH PROPOFOL N/A 11/16/2021   Surgeon: Eloise Harman, DO;  Nonbleeding internal hemorrhoids, otherwise normal exam.  Recommended 10-year screening colonoscopy.   CYST EXCISION  10/25/2010   right hand   CYSTOSCOPY W/ URETERAL STENT PLACEMENT Right 12/22/2015   Procedure: CYSTOSCOPY WITH RETROGRADE PYELOGRAM/URETERAL STENT PLACEMENT;  Surgeon: Nickie Retort, MD;  Location: [facility];  Service: Urology;  Laterality: Right;   CYSTOSCOPY WITH RETROGRADE PYELOGRAM, URETEROSCOPY AND STENT PLACEMENT Right 01/05/2016   Procedure: CYSTOSCOPY WITH RETROGRADE PYELOGRAM, URETEROSCOPY AND STENT REPLACEMENT;  Surgeon: Nickie Retort, MD;  Location: [facility];  Service: Urology;  Laterality: Right;   DILATION AND CURETTAGE OF UTERUS  03/12/2009   w/  Suction   double balloon enteroscopy     Dr. Arsenio Loader at [facility]: no erosions, no evidence of Crohn's disease, path without Crohn's.    EGD with push enteroscopy  02/12/2010    patent distal peptic stricture with diffuse antral erythema, normal D1 and D2    ESOPHAGOGASTRODUODENOSCOPY (EGD) WITH PROPOFOL N/A 11/16/2021   Surgeon: Eloise Harman, DO;   Gastritis biopsied, otherwise normal exam.  Pathology with mild chronic gastritis, negative for H. pylori.   HAND SURGERY     LAPAROSCOPIC ASSISTED VAGINAL HYSTERECTOMY  10/26/2014   w/  Bilateral  Salpingoophorectomy   STONE EXTRACTION WITH BASKET Right 01/05/2016   Procedure: STONE EXTRACTION WITH BASKET;  Surgeon: Nickie Retort, MD;  Location: [facility];  Service: Urology;  Laterality: Right;   TONSILLECTOMY  1998  approx     Current Meds  Medication Sig   albuterol (VENTOLIN HFA) 108 (90 Base) MCG/ACT inhaler Inhale 2 puffs into the lungs every 6 (six) hours as needed for wheezing or shortness of breath.   BD PEN NEEDLE NANO 2ND GEN 32G X 4 MM MISC USE AS DIRECTED   busPIRone (BUSPAR) 5 MG tablet TAKE 1 TABLET BY MOUTH THREE TIMES DAILY (Patient taking differently: Take 5 mg by mouth 3 (three) times daily as needed (anxiety).)   calcium carbonate (TUMS - DOSED IN MG ELEMENTAL CALCIUM) 500 MG chewable tablet Chew 1,500 mg by mouth 2 (two) times daily as needed for indigestion or heartburn.   Carboxymethylcellul-Glycerin (LUBRICATING EYE DROPS OP) Place 1 drop into both eyes daily as needed (dry eyes).   Chromium 1000 MCG TABS Take 1,000 mcg by mouth daily.   CINNAMON PO Take 2,000 mg by mouth daily.   Continuous Blood Gluc Receiver (FREESTYLE LIBRE 2 READER) DEVI Use as directed to check blood  sugars.   Continuous Blood Gluc Sensor (FREESTYLE LIBRE 2 SENSOR) MISC Use sensor as directed. Change every 10-14 days.   Cyanocobalamin (B-12) 2000 MCG TABS Take 2,000 mcg by mouth daily.   EPINEPHrine 0.3 mg/0.3 mL IJ SOAJ injection Inject 0.3 mg into the muscle as needed for anaphylaxis.   folic acid (FOLVITE) 1 MG tablet Take 2 tablets (2 mg total) by mouth daily. Take 1 tablet daily for 14 days, then increase to 2 tablets daily thereafter.   hydrochlorothiazide (HYDRODIURIL) 25 MG tablet Take 1 tablet (25 mg total) by mouth daily.   HYDROcodone-acetaminophen (NORCO/VICODIN) 5-325 MG tablet Take 1 tablet by mouth every 6 (six) hours as needed for severe pain. To be used sparingly for acute, severe pain.   losartan (COZAAR) 100 MG tablet TAKE 1 TABLET BY MOUTH ONCE  DAILY.   meclizine (ANTIVERT) 25 MG tablet Take 25 mg by mouth 3 (three) times daily as needed for dizziness.   metFORMIN (GLUCOPHAGE) 850 MG tablet Take 1 tablet (850 mg total) by mouth 2 (two) times daily with a meal.   methotrexate (RHEUMATREX) 2.5 MG tablet Take 8 tablets (20 mg total) by mouth once a week.   naproxen (NAPROSYN) 500 MG tablet Take 1 tablet (500 mg total) by mouth 2 (two) times daily.   ondansetron (ZOFRAN-ODT) 4 MG disintegrating tablet Take 4 mg by mouth every 8 (eight) hours as needed for nausea or vomiting.   pantoprazole (PROTONIX) 40 MG tablet Take 1 tablet (40 mg total) by mouth 2 (two) times daily before a meal.   PARoxetine (PAXIL) 40 MG tablet TAKE 1 TABLET BY MOUTH ONCE DAILY IN THE MORNING   Probiotic Product (PROBIOTIC PO) Take 1 capsule by mouth daily.   prochlorperazine (COMPAZINE) 10 MG tablet Take 1 tablet (10 mg total) by mouth every 6 (six) hours as needed for nausea or vomiting.   rizatriptan (MAXALT-MLT) 10 MG disintegrating tablet Take 1 tablet (10 mg total) by mouth as needed for migraine. May repeat in 2 hours if needed. Max 2 per 24 hours   SEMGLEE, YFGN, 100 UNIT/ML Pen INJECT 35 UNITS SUBCUTANEOUSLY EVERY NIGHT (Patient taking differently: INJECT 30 UNITS SUBCUTANEOUSLY EVERY NIGHT)   traZODone (DESYREL) 100 MG tablet TAKE 1 TABLET BY MOUTH AT BEDTIME AS NEEDED FOR SLEEP   [DISCONTINUED] omeprazole (PRILOSEC) 40 MG capsule Take 1 capsule (40 mg total) by mouth 2 (two) times daily before a meal.     Family History  Problem Relation Age of Onset   Hypertension Mother    Thyroid disease Mother    Diabetes Mother    Hyperlipidemia Mother    Stroke Mother    Heart disease Mother    Hyperlipidemia Father    Hypertension Father    Heart attack Father    Heart failure Father    Colon cancer Neg Hx     Social History   Socioeconomic History   Marital status: Single    Spouse name: Not on file   Number of children: Not on file   Years of  education: Not on file   Highest education level: Not on file  Occupational History   Not on file  Tobacco Use   Smoking status: Never   Smokeless tobacco: Never  Vaping Use   Vaping Use: Never used  Substance and Sexual Activity   Alcohol use: Yes    Comment: occassionally   Drug use: No   Sexual activity: Not on file  Other Topics Concern   Not  on file  Social History Narrative   Not on file   Social Determinants of Health   Financial Resource Strain: Low Risk  (06/30/2021)   Overall Financial Resource Strain (CARDIA)    Difficulty of Paying Living Expenses: Not hard at all  Food Insecurity: No Food Insecurity (06/30/2021)   Hunger Vital Sign    Worried About Running Out of Food in the Last Year: Never true    Show Low in the Last Year: Never true  Transportation Needs: No Transportation Needs (06/30/2021)   PRAPARE - Hydrologist (Medical): No    Lack of Transportation (Non-Medical): No  Physical Activity: Inactive (06/30/2021)   Exercise Vital Sign    Days of Exercise per Week: 0 days    Minutes of Exercise per Session: 0 min  Stress: Stress Concern Present (06/30/2021)   [place]    Feeling of Stress : Very much  Social Connections: Socially Isolated (06/30/2021)   Social Connection and Isolation Panel [NHANES]    Frequency of Communication with Friends and Family: More than three times a week    Frequency of Social Gatherings with Friends and Family: More than three times a week    Attends Religious Services: Never    Marine scientist or Organizations: No    Attends Archivist Meetings: Never    Marital Status: Never married       Review of Systems: Gen: Denies fever, chills, anorexia. Denies fatigue, weakness, weight loss.  CV: Denies chest pain, palpitations, syncope, peripheral edema, and claudication. Resp: Denies dyspnea at rest, cough, wheezing,  coughing up blood, and pleurisy. GI: see HPI Derm: Denies rash, itching, dry skin Psych: Denies depression, anxiety, memory loss, confusion. No homicidal or suicidal ideation.  Heme: Denies bruising, bleeding, and enlarged lymph nodes.  Observations/Objective: No distress. Alert and oriented. Pleasant. Well nourished. Normal mood and affect. Unable to perform complete physical exam due to video encounter.    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   GERD:  Improved, but still not adequately managed on omeprazole 40 mg BID. Breakthrough about twice weekly and now with new onset dysphagia as per below. Recent EGD with gastritis. No esophageal abnormalities. Will try changing omeprazole to pantoprazole BID.   Dysphagia:  New onset daily food and pill dysphagia with intermittent regurgitation. Recent EGD in January with normal esophagus, but she was not having dysphagia symptoms at that time. She does have GERD with breakthrough about twice a week which may be contributing; however, due to fairly significant symptoms, I have recommended repeating an EGD with possible dilation for further evaluation and therapeutic intervention. Need to rule out esophageal web, ring, stricture, or malignancy. It is possible she could have a motility disorder and we can evaluate for this in the future if needed. Also can't rule out symptoms being related to sarcoidosis, but this is not very common.  Epigastric pain/nausea without vomiting/early satiety:  Greater than 76-monthhistory of intermittent epigastric abdominal pain that can be triggered by meals, but not always.  Now with new  onset daily nausea for the last few months without vomiting, early satiety, and  a 6 pound weight loss.  EGD January 2023 with gastritis, biopsies with mild chronic gastritis, negative for H. pylori.  She reports mild improvement with increasing omeprazole to 40 mg twice daily as she has also had some improvement in GERD symptoms, but continues with breakthrough about twice a week.  No melena. Recently started Naproxen for left foot tendinitis, but symptoms were present prior to starting naproxen.  She was started on methotrexate earlier this year for sarcoidosis which may very well be contributing to her upper GI symptoms, but additional differentials include biliary etiology, gastroparesis, refractory GERD, and less likely pancreatic etiology.  We will plan to change PPIs, update labs, obtain RUQ ultrasound, and GES.   Hemorrhoids:  Known internal hemorrhoids noted on recent colonoscopy in January 2023. She continues with intermittent flare with symptoms including rectal burning/itching and rectal bleeding and would like to proceed with hemorrhoid banding. Notably, she is dealing with some constipation at this time, but was having flares prior to this as well. Discussed that we need to ensure her bowel habits are improved to ensure banding is successful as hemorrhoids can return following banding.   Change in bowel habits:   History of IBS, diarrhea predominant, now with constipation and skipping 2-3 days between Bms. Recent colonoscopy January 2023 with hemorrhoids, otherwise normal. She has had decreased po intake due to nausea as per above which may be contributing. Recently started methotrexate, but this is not a common side effect. No other new medications. Will have her start MiraLAX daily and let us know of any persistent symptoms.   LLQ abdominal pain:  Overall improved, but still with some tenderness in LLQ every now and then, possibly secondary to constipation. Recent colonoscopy without  findings to explain her symptoms. If persistent pain despite improvement in constipation, will need to consider imaging.    Plan: CBC, CMP, lipase. Proceed with upper endoscopy +/- dilation with propofol by Dr. Abbey Chatters in near future. The risks, benefits, and alternatives have been discussed with the patient in detail. The patient states understanding and desires to proceed. ASA 3 Separate instructions provided for diabetes medication adjustments. RUQ ultrasound. GES. Stop omeprazole and start pantoprazole 40 mg twice daily 30 minutes before breakfast and dinner. 4-6 mmol daily. Avoid fried, fatty, greasy, spicy, citrus foods, and carbonated beverages. Continue Zofran as needed. Start MiraLAX 17 g daily. Start Benefiber 3 teaspoons daily. Recommended at least 64 ounces of water daily along with increasing dietary fiber. Anusol rectal cream twice daily x7 days, then again as needed. Limit toilet time to-2 minutes. Avoid straining. Arrange for possible hemorrhoid banding with Venetia Night, NP in the near future.  Follow-up in 2 to 3 months.     I discussed the assessment and treatment plan with the patient. The patient was provided an opportunity to ask questions and all were answered. The patient agreed with the plan and demonstrated an understanding of the instructions.   The patient was advised to call back or seek an in-person evaluation if the symptoms worsen or if the condition fails to improve as anticipated.  I provided 30 minutes of video-face-to-face time during this encounter.  [name], PA-C [facility]  04/14/2022

## 2022-04-13 NOTE — H&P (View-Only) (Signed)
Primary Care Physician:  Coral Spikes, DO  Primary GI: Dr. Abbey Chatters  Patient Location: Home   Provider Location: [facility] office   Reason for Visit: Follow-up   Persons present on the virtual encounter, with roles: [name], PA-C (Provider), Courtney Hale (patient)   Total time (minutes) spent on medical discussion: 30 minutes  Virtual Visit via video Note Due to COVID-19, visit is conducted virtually and was requested by patient.   I connected with Courtney Hale on 04/14/22 at 11:30 AM EDT by video and verified that I am speaking with the correct person using two identifiers.   I discussed the limitations, risks, security and privacy concerns of performing an evaluation and management service by video and the availability of in person appointments. I also discussed with the patient that there may be a patient responsible charge related to this service. The patient expressed understanding and agreed to proceed.  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   Last seen in our office 11/05/2021.  Reported new onset LLQ abdominal pain, different from prior abdominal pain.  Also with rectal bleeding and she thought her hemorrhoids were acting up.  Also reported uncontrolled reflux, recently started on omeprazole, but still having breakthrough symptoms.  Associated epigastric pain.  Omeprazole was increased to twice daily, and she was scheduled for EGD and colonoscopy  Procedures completed 11/16/2021: Colonoscopy: Nonbleeding internal hemorrhoids, otherwise normal exam.  Recommended 10-year screening colonoscopy.   Consider hemorrhoid banding if bleeding continues. EGD: Gastritis biopsied, otherwise normal exam.  Pathology with mild chronic gastritis, negative for H. pylori.  Today:   GERD as improved, but continues with reflux a couple times a week on omeprazole 40 mg BID. Has never been on any other PPIs  Feels like foods and pills are getting hung in her esophagus. Daily symptoms for the last couple of months. Occasional regurgitation.   Continues with intermittent epigastric discomfort. Slight improvement with omeprazole BID. Can be triggered by meals, but not always. It is intermittent. Sometimes sharp, sometimes dull. Daily nausea for the last few months without vomiting. Taking Zofran. Some early satiety. Doesn't have a great appetite. Has been losing weight. Reports weight of 188 lbs today, weighed 194 at last visit. She isn't sure if methotraxate may be contributing to her nausea. Taking methotrexate every Monday, but nausea is every day.   Reports last A1C 6.4.   Just started naproxen Monday last week for tendonitis left foot. Taking once daily.    Still having trouble with hemorrhoids. Rectal burning/itching and intermittent bleeding. Blood is in the water. No sharp knife like pain. Using OTC hemorrhoid cream couple times a day when she flares. Flares occur once a month lasting 4-5 days. Interested in banding.    LLQ abdominal pain:  Improved, but still tender in LLQ. Only every now and then. Having some issues with constipation where she is skipping 2-3 days. Hasn't taken anything.   Past Medical History:  Diagnosis Date   Complication of anesthesia    woke up during endoscopy and colonoscopy   Depression    Diabetes ([place])    GERD (gastroesophageal reflux disease)  Hemorrhoids    History of esophageal stricture    2011  peptic w/  dilatation   History of gastritis    2011   History of melanoma excision    2015  left leg/   12-09-2015 back    Hypertension    Microhematuria     Migraines    Nephrolithiasis    left side non-obstructive    Right ureteral stone    Sarcoidosis 07/24/2021   Urgency of urination    Wears contact lenses      Past Surgical History:  Procedure Laterality Date   ABDOMINAL HYSTERECTOMY     BIOPSY  11/16/2021   Procedure: BIOPSY;  Surgeon: Eloise Harman, DO;  Location: [facility];  Service: Endoscopy;;   CARPAL TUNNEL RELEASE Right 10/29/2013   COLONOSCOPY N/A 08/27/2015   Procedure: COLONOSCOPY;  Surgeon: Danie Binder, MD;  Location: [facility];  Service: Endoscopy;  Laterality: N/A;  0830   COLONOSCOPY WITH PROPOFOL N/A 11/16/2021   Surgeon: Eloise Harman, DO;  Nonbleeding internal hemorrhoids, otherwise normal exam.  Recommended 10-year screening colonoscopy.   CYST EXCISION  10/25/2010   right hand   CYSTOSCOPY W/ URETERAL STENT PLACEMENT Right 12/22/2015   Procedure: CYSTOSCOPY WITH RETROGRADE PYELOGRAM/URETERAL STENT PLACEMENT;  Surgeon: Nickie Retort, MD;  Location: [facility];  Service: Urology;  Laterality: Right;   CYSTOSCOPY WITH RETROGRADE PYELOGRAM, URETEROSCOPY AND STENT PLACEMENT Right 01/05/2016   Procedure: CYSTOSCOPY WITH RETROGRADE PYELOGRAM, URETEROSCOPY AND STENT REPLACEMENT;  Surgeon: Nickie Retort, MD;  Location: [facility];  Service: Urology;  Laterality: Right;   DILATION AND CURETTAGE OF UTERUS  03/12/2009   w/  Suction   double balloon enteroscopy     Dr. Arsenio Loader at [facility]: no erosions, no evidence of Crohn's disease, path without Crohn's.    EGD with push enteroscopy  02/12/2010    patent distal peptic stricture with diffuse antral erythema, normal D1 and D2    ESOPHAGOGASTRODUODENOSCOPY (EGD) WITH PROPOFOL N/A 11/16/2021   Surgeon: Eloise Harman, DO;   Gastritis biopsied, otherwise normal exam.  Pathology with mild chronic gastritis, negative for H. pylori.   HAND SURGERY     LAPAROSCOPIC ASSISTED VAGINAL HYSTERECTOMY  10/26/2014   w/  Bilateral  Salpingoophorectomy   STONE EXTRACTION WITH BASKET Right 01/05/2016   Procedure: STONE EXTRACTION WITH BASKET;  Surgeon: Nickie Retort, MD;  Location: [facility];  Service: Urology;  Laterality: Right;   TONSILLECTOMY  1998  approx     Current Meds  Medication Sig   albuterol (VENTOLIN HFA) 108 (90 Base) MCG/ACT inhaler Inhale 2 puffs into the lungs every 6 (six) hours as needed for wheezing or shortness of breath.   BD PEN NEEDLE NANO 2ND GEN 32G X 4 MM MISC USE AS DIRECTED   busPIRone (BUSPAR) 5 MG tablet TAKE 1 TABLET BY MOUTH THREE TIMES DAILY (Patient taking differently: Take 5 mg by mouth 3 (three) times daily as needed (anxiety).)   calcium carbonate (TUMS - DOSED IN MG ELEMENTAL CALCIUM) 500 MG chewable tablet Chew 1,500 mg by mouth 2 (two) times daily as needed for indigestion or heartburn.   Carboxymethylcellul-Glycerin (LUBRICATING EYE DROPS OP) Place 1 drop into both eyes daily as needed (dry eyes).   Chromium 1000 MCG TABS Take 1,000 mcg by mouth daily.   CINNAMON PO Take 2,000 mg by mouth daily.   Continuous Blood Gluc Receiver (FREESTYLE LIBRE 2 READER) DEVI Use as directed to check blood  sugars.   Continuous Blood Gluc Sensor (FREESTYLE LIBRE 2 SENSOR) MISC Use sensor as directed. Change every 10-14 days.   Cyanocobalamin (B-12) 2000 MCG TABS Take 2,000 mcg by mouth daily.   EPINEPHrine 0.3 mg/0.3 mL IJ SOAJ injection Inject 0.3 mg into the muscle as needed for anaphylaxis.   folic acid (FOLVITE) 1 MG tablet Take 2 tablets (2 mg total) by mouth daily. Take 1 tablet daily for 14 days, then increase to 2 tablets daily thereafter.   hydrochlorothiazide (HYDRODIURIL) 25 MG tablet Take 1 tablet (25 mg total) by mouth daily.   HYDROcodone-acetaminophen (NORCO/VICODIN) 5-325 MG tablet Take 1 tablet by mouth every 6 (six) hours as needed for severe pain. To be used sparingly for acute, severe pain.   losartan (COZAAR) 100 MG tablet TAKE 1 TABLET BY MOUTH ONCE  DAILY.   meclizine (ANTIVERT) 25 MG tablet Take 25 mg by mouth 3 (three) times daily as needed for dizziness.   metFORMIN (GLUCOPHAGE) 850 MG tablet Take 1 tablet (850 mg total) by mouth 2 (two) times daily with a meal.   methotrexate (RHEUMATREX) 2.5 MG tablet Take 8 tablets (20 mg total) by mouth once a week.   naproxen (NAPROSYN) 500 MG tablet Take 1 tablet (500 mg total) by mouth 2 (two) times daily.   ondansetron (ZOFRAN-ODT) 4 MG disintegrating tablet Take 4 mg by mouth every 8 (eight) hours as needed for nausea or vomiting.   pantoprazole (PROTONIX) 40 MG tablet Take 1 tablet (40 mg total) by mouth 2 (two) times daily before a meal.   PARoxetine (PAXIL) 40 MG tablet TAKE 1 TABLET BY MOUTH ONCE DAILY IN THE MORNING   Probiotic Product (PROBIOTIC PO) Take 1 capsule by mouth daily.   prochlorperazine (COMPAZINE) 10 MG tablet Take 1 tablet (10 mg total) by mouth every 6 (six) hours as needed for nausea or vomiting.   rizatriptan (MAXALT-MLT) 10 MG disintegrating tablet Take 1 tablet (10 mg total) by mouth as needed for migraine. May repeat in 2 hours if needed. Max 2 per 24 hours   SEMGLEE, YFGN, 100 UNIT/ML Pen INJECT 35 UNITS SUBCUTANEOUSLY EVERY NIGHT (Patient taking differently: INJECT 30 UNITS SUBCUTANEOUSLY EVERY NIGHT)   traZODone (DESYREL) 100 MG tablet TAKE 1 TABLET BY MOUTH AT BEDTIME AS NEEDED FOR SLEEP   [DISCONTINUED] omeprazole (PRILOSEC) 40 MG capsule Take 1 capsule (40 mg total) by mouth 2 (two) times daily before a meal.     Family History  Problem Relation Age of Onset   Hypertension Mother    Thyroid disease Mother    Diabetes Mother    Hyperlipidemia Mother    Stroke Mother    Heart disease Mother    Hyperlipidemia Father    Hypertension Father    Heart attack Father    Heart failure Father    Colon cancer Neg Hx     Social History   Socioeconomic History   Marital status: Single    Spouse name: Not on file   Number of children: Not on file   Years of  education: Not on file   Highest education level: Not on file  Occupational History   Not on file  Tobacco Use   Smoking status: Never   Smokeless tobacco: Never  Vaping Use   Vaping Use: Never used  Substance and Sexual Activity   Alcohol use: Yes    Comment: occassionally   Drug use: No   Sexual activity: Not on file  Other Topics Concern   Not  on file  Social History Narrative   Not on file   Social Determinants of Health   Financial Resource Strain: Low Risk  (06/30/2021)   Overall Financial Resource Strain (CARDIA)    Difficulty of Paying Living Expenses: Not hard at all  Food Insecurity: No Food Insecurity (06/30/2021)   Hunger Vital Sign    Worried About Running Out of Food in the Last Year: Never true    [place] in the Last Year: Never true  Transportation Needs: No Transportation Needs (06/30/2021)   PRAPARE - Hydrologist (Medical): No    Lack of Transportation (Non-Medical): No  Physical Activity: Inactive (06/30/2021)   Exercise Vital Sign    Days of Exercise per Week: 0 days    Minutes of Exercise per Session: 0 min  Stress: Stress Concern Present (06/30/2021)   [place]    Feeling of Stress : Very much  Social Connections: Socially Isolated (06/30/2021)   Social Connection and Isolation Panel [NHANES]    Frequency of Communication with Friends and Family: More than three times a week    Frequency of Social Gatherings with Friends and Family: More than three times a week    Attends Religious Services: Never    Marine scientist or Organizations: No    Attends Archivist Meetings: Never    Marital Status: Never married       Review of Systems: Gen: Denies fever, chills, anorexia. Denies fatigue, weakness, weight loss.  CV: Denies chest pain, palpitations, syncope, peripheral edema, and claudication. Resp: Denies dyspnea at rest, cough, wheezing,  coughing up blood, and pleurisy. GI: see HPI Derm: Denies rash, itching, dry skin Psych: Denies depression, anxiety, memory loss, confusion. No homicidal or suicidal ideation.  Heme: Denies bruising, bleeding, and enlarged lymph nodes.  Observations/Objective: No distress. Alert and oriented. Pleasant. Well nourished. Normal mood and affect. Unable to perform complete physical exam due to video encounter.    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   GERD:  Improved, but still not adequately managed on omeprazole 40 mg BID. Breakthrough about twice weekly and now with new onset dysphagia as per below. Recent EGD with gastritis. No esophageal abnormalities. Will try changing omeprazole to pantoprazole BID.   Dysphagia:  New onset daily food and pill dysphagia with intermittent regurgitation. Recent EGD in January with normal esophagus, but she was not having dysphagia symptoms at that time. She does have GERD with breakthrough about twice a week which may be contributing; however, due to fairly significant symptoms, I have recommended repeating an EGD with possible dilation for further evaluation and therapeutic intervention. Need to rule out esophageal web, ring, stricture, or malignancy. It is possible she could have a motility disorder and we can evaluate for this in the future if needed. Also can't rule out symptoms being related to sarcoidosis, but this is not very common.  Epigastric pain/nausea without vomiting/early satiety:  Greater than 64-monthhistory of intermittent epigastric abdominal pain that can be triggered by meals, but not always.  Now with new  onset daily nausea for the last few months without vomiting, early satiety, and  a 6 pound weight loss.  EGD January 2023 with gastritis, biopsies with mild chronic gastritis, negative for H. pylori.  She reports mild improvement with increasing omeprazole to 40 mg twice daily as she has also had some improvement in GERD symptoms, but continues with breakthrough about twice a week.  No melena. Recently started Naproxen for left foot tendinitis, but symptoms were present prior to starting naproxen.  She was started on methotrexate earlier this year for sarcoidosis which may very well be contributing to her upper GI symptoms, but additional differentials include biliary etiology, gastroparesis, refractory GERD, and less likely pancreatic etiology.  We will plan to change PPIs, update labs, obtain RUQ ultrasound, and GES.   Hemorrhoids:  Known internal hemorrhoids noted on recent colonoscopy in January 2023. She continues with intermittent flare with symptoms including rectal burning/itching and rectal bleeding and would like to proceed with hemorrhoid banding. Notably, she is dealing with some constipation at this time, but was having flares prior to this as well. Discussed that we need to ensure her bowel habits are improved to ensure banding is successful as hemorrhoids can return following banding.   Change in bowel habits:   History of IBS, diarrhea predominant, now with constipation and skipping 2-3 days between Bms. Recent colonoscopy January 2023 with hemorrhoids, otherwise normal. She has had decreased po intake due to nausea as per above which may be contributing. Recently started methotrexate, but this is not a common side effect. No other new medications. Will have her start MiraLAX daily and let us know of any persistent symptoms.   LLQ abdominal pain:  Overall improved, but still with some tenderness in LLQ every now and then, possibly secondary to constipation. Recent colonoscopy without  findings to explain her symptoms. If persistent pain despite improvement in constipation, will need to consider imaging.    Plan: CBC, CMP, lipase. Proceed with upper endoscopy +/- dilation with propofol by Dr. Abbey Chatters in near future. The risks, benefits, and alternatives have been discussed with the patient in detail. The patient states understanding and desires to proceed. ASA 3 Separate instructions provided for diabetes medication adjustments. RUQ ultrasound. GES. Stop omeprazole and start pantoprazole 40 mg twice daily 30 minutes before breakfast and dinner. 4-6 mmol daily. Avoid fried, fatty, greasy, spicy, citrus foods, and carbonated beverages. Continue Zofran as needed. Start MiraLAX 17 g daily. Start Benefiber 3 teaspoons daily. Recommended at least 64 ounces of water daily along with increasing dietary fiber. Anusol rectal cream twice daily x7 days, then again as needed. Limit toilet time to-2 minutes. Avoid straining. Arrange for possible hemorrhoid banding with Venetia Night, NP in the near future.  Follow-up in 2 to 3 months.     I discussed the assessment and treatment plan with the patient. The patient was provided an opportunity to ask questions and all were answered. The patient agreed with the plan and demonstrated an understanding of the instructions.   The patient was advised to call back or seek an in-person evaluation if the symptoms worsen or if the condition fails to improve as anticipated.  I provided 30 minutes of video-face-to-face time during this encounter.  [name], PA-C [facility]  04/14/2022

## 2022-04-14 ENCOUNTER — Encounter: Payer: Self-pay | Admitting: Gastroenterology

## 2022-04-14 ENCOUNTER — Telehealth (INDEPENDENT_AMBULATORY_CARE_PROVIDER_SITE_OTHER): Payer: Medicaid Other | Admitting: Gastroenterology

## 2022-04-14 ENCOUNTER — Encounter: Payer: Self-pay | Admitting: *Deleted

## 2022-04-14 ENCOUNTER — Telehealth: Payer: Self-pay | Admitting: *Deleted

## 2022-04-14 VITALS — Ht 63.0 in | Wt 188.0 lb

## 2022-04-14 DIAGNOSIS — R1310 Dysphagia, unspecified: Secondary | ICD-10-CM | POA: Diagnosis not present

## 2022-04-14 DIAGNOSIS — R1032 Left lower quadrant pain: Secondary | ICD-10-CM | POA: Diagnosis not present

## 2022-04-14 DIAGNOSIS — K625 Hemorrhage of anus and rectum: Secondary | ICD-10-CM | POA: Diagnosis not present

## 2022-04-14 DIAGNOSIS — R112 Nausea with vomiting, unspecified: Secondary | ICD-10-CM | POA: Insufficient documentation

## 2022-04-14 DIAGNOSIS — R1013 Epigastric pain: Secondary | ICD-10-CM | POA: Insufficient documentation

## 2022-04-14 DIAGNOSIS — R194 Change in bowel habit: Secondary | ICD-10-CM | POA: Diagnosis not present

## 2022-04-14 DIAGNOSIS — K649 Unspecified hemorrhoids: Secondary | ICD-10-CM | POA: Insufficient documentation

## 2022-04-14 DIAGNOSIS — K219 Gastro-esophageal reflux disease without esophagitis: Secondary | ICD-10-CM | POA: Diagnosis not present

## 2022-04-14 DIAGNOSIS — R110 Nausea: Secondary | ICD-10-CM | POA: Diagnosis not present

## 2022-04-14 DIAGNOSIS — R6881 Early satiety: Secondary | ICD-10-CM

## 2022-04-14 DIAGNOSIS — R1010 Upper abdominal pain, unspecified: Secondary | ICD-10-CM | POA: Insufficient documentation

## 2022-04-14 MED ORDER — PANTOPRAZOLE SODIUM 40 MG PO TBEC: 40.0000 mg | DELAYED_RELEASE_TABLET | Freq: Two times a day (BID) | ORAL | 3 refills | Status: DC

## 2022-04-14 NOTE — Telephone Encounter (Signed)
Called pt, provided with GES/US appt details. Also scheduled for EGD +/-dil with Dr. Abbey Chatters, asa 3 on 7/10 at 2:45pm. Aware will send instructions/pre-op to mychart.

## 2022-04-14 NOTE — Telephone Encounter (Signed)
Courtney Hale, you are scheduled for a virtual visit with your provider today.  Just as we do with appointments in the office, we must obtain your consent to participate.  Your consent will be active for this visit and any virtual visit you may have with one of our providers in the next 365 days.  If you have a MyChart account, I can also send a copy of this consent to you electronically.  All virtual visits are billed to your insurance company just like a traditional visit in the office.  As this is a virtual visit, video technology does not allow for your provider to perform a traditional examination.  This may limit your provider's ability to fully assess your condition.  If your provider identifies any concerns that need to be evaluated in person or the need to arrange testing such as labs, EKG, etc, we will make arrangements to do so.  Although advances in technology are sophisticated, we cannot ensure that it will always work on either your end or our end.  If the connection with a video visit is poor, we may have to switch to a telephone visit.  With either a video or telephone visit, we are not always able to ensure that we have a secure connection.   I need to obtain your verbal consent now.   Are you willing to proceed with your visit today?  Patient consents to virtual visit.

## 2022-04-14 NOTE — Patient Instructions (Addendum)
Please have labs completed at [place].   We will arrange for you to have an upper endoscopy with possible dilation of your esophagus in the near future with Dr. Abbey Chatters.  We will arrange for you to have an [place] of your abdomen to evaluate your gallbladder.   We will arrange for a gastric emptying study to further evaluate your sensation of getting full quickly as well as nausea.  For reflux: Stop omeprazole and start pantoprazole 40 mg twice daily 30 minutes before breakfast and dinner. Eat 4-6 small meals daily. Avoid fried, fatty, greasy, spicy, citrus foods. Avoid carbonated beverages.  Continue to use Zofran as needed for nausea.   For constipation: Start MiraLAX 1 capful (17 g) daily in 8 ounces of water, coffee, juice, or other noncarbonated beverage. Try to drink at least 64 ounces of water daily. Consume plenty of dietary fiber through fruits, vegetables, and whole grains. Recommend adding Benefiber 3 teaspoons daily.  For hemorrhoids: Start Anusol rectal cream twice daily x7 days, then stop.  Use again as needed. Limit toilet time to 2-3 minutes. Avoid straining. We will arrange you to have possible hemorrhoid banding with Venetia Night, NP in the near future.  As we discussed, is important your constipation is well controlled prior to your appointment.  We will plan to see you back in the office for routine follow-up in 2 to 3 months.  It was nice meeting you today!  [name], PA-C [facility]

## 2022-04-16 DIAGNOSIS — Z85828 Personal history of other malignant neoplasm of skin: Secondary | ICD-10-CM | POA: Diagnosis not present

## 2022-04-16 DIAGNOSIS — Z85820 Personal history of malignant melanoma of skin: Secondary | ICD-10-CM | POA: Diagnosis not present

## 2022-04-16 DIAGNOSIS — D1801 Hemangioma of skin and subcutaneous tissue: Secondary | ICD-10-CM | POA: Diagnosis not present

## 2022-04-16 DIAGNOSIS — Z08 Encounter for follow-up examination after completed treatment for malignant neoplasm: Secondary | ICD-10-CM | POA: Diagnosis not present

## 2022-04-16 DIAGNOSIS — L821 Other seborrheic keratosis: Secondary | ICD-10-CM | POA: Diagnosis not present

## 2022-04-16 DIAGNOSIS — L814 Other melanin hyperpigmentation: Secondary | ICD-10-CM | POA: Diagnosis not present

## 2022-04-16 DIAGNOSIS — L570 Actinic keratosis: Secondary | ICD-10-CM | POA: Diagnosis not present

## 2022-04-19 ENCOUNTER — Ambulatory Visit (HOSPITAL_COMMUNITY)
Admission: RE | Admit: 2022-04-19 | Discharge: 2022-04-19 | Disposition: A | Discharge status: Home / Self Care | Payer: Medicaid Other | Source: Ambulatory Visit | Attending: Gastroenterology | Admitting: Gastroenterology

## 2022-04-19 ENCOUNTER — Ambulatory Visit: Payer: Medicaid Other | Admitting: Pulmonary Disease

## 2022-04-19 ENCOUNTER — Encounter: Payer: Self-pay | Admitting: Pulmonary Disease

## 2022-04-19 VITALS — BP 114/64 | HR 75 | Temp 97.9°F | Ht 63.0 in | Wt 191.6 lb

## 2022-04-19 DIAGNOSIS — R110 Nausea: Secondary | ICD-10-CM | POA: Insufficient documentation

## 2022-04-19 DIAGNOSIS — R1013 Epigastric pain: Secondary | ICD-10-CM | POA: Diagnosis not present

## 2022-04-19 DIAGNOSIS — D869 Sarcoidosis, unspecified: Secondary | ICD-10-CM

## 2022-04-19 DIAGNOSIS — E876 Hypokalemia: Secondary | ICD-10-CM

## 2022-04-19 DIAGNOSIS — R109 Unspecified abdominal pain: Secondary | ICD-10-CM | POA: Diagnosis not present

## 2022-04-19 LAB — CBC WITH DIFFERENTIAL/PLATELET
Basophils Absolute: 0.1 10*3/uL (ref 0.0–0.1)
Basophils Relative: 0.7 % (ref 0.0–3.0)
Eosinophils Absolute: 0.3 10*3/uL (ref 0.0–0.7)
Eosinophils Relative: 3.9 % (ref 0.0–5.0)
HCT: 37.3 % (ref 36.0–46.0)
Hemoglobin: 12.7 g/dL (ref 12.0–15.0)
Lymphocytes Relative: 35.8 % (ref 12.0–46.0)
Lymphs Abs: 2.8 10*3/uL (ref 0.7–4.0)
MCHC: 34 g/dL (ref 30.0–36.0)
MCV: 88.4 fl (ref 78.0–100.0)
Monocytes Absolute: 0.6 10*3/uL (ref 0.1–1.0)
Monocytes Relative: 7.6 % (ref 3.0–12.0)
Neutro Abs: 4.1 10*3/uL (ref 1.4–7.7)
Neutrophils Relative %: 52 % (ref 43.0–77.0)
Platelets: 253 10*3/uL (ref 150.0–400.0)
RBC: 4.22 Mil/uL (ref 3.87–5.11)
RDW: 14.9 % (ref 11.5–15.5)
WBC: 7.9 10*3/uL (ref 4.0–10.5)

## 2022-04-19 LAB — COMPREHENSIVE METABOLIC PANEL
ALT: 32 U/L (ref 0–35)
AST: 26 U/L (ref 0–37)
Albumin: 4.3 g/dL (ref 3.5–5.2)
Alkaline Phosphatase: 59 U/L (ref 39–117)
BUN: 13 mg/dL (ref 6–23)
CO2: 27 mEq/L (ref 19–32)
Calcium: 9.5 mg/dL (ref 8.4–10.5)
Chloride: 100 mEq/L (ref 96–112)
Creatinine, Ser: 0.63 mg/dL (ref 0.40–1.20)
GFR: 100.88 mL/min (ref >60.00)
Glucose, Bld: 151 mg/dL — ABNORMAL HIGH (ref 70–99)
Potassium: 3 mEq/L — ABNORMAL LOW (ref 3.5–5.1)
Sodium: 139 mEq/L (ref 135–145)
Total Bilirubin: 0.5 mg/dL (ref 0.2–1.2)
Total Protein: 6.5 g/dL (ref 6.0–8.3)

## 2022-04-20 ENCOUNTER — Encounter: Payer: Self-pay | Admitting: Pulmonary Disease

## 2022-04-20 ENCOUNTER — Encounter (HOSPITAL_COMMUNITY): Payer: Self-pay

## 2022-04-20 ENCOUNTER — Other Ambulatory Visit: Payer: Self-pay | Admitting: Family Medicine

## 2022-04-20 ENCOUNTER — Ambulatory Visit (HOSPITAL_COMMUNITY)
Admission: RE | Admit: 2022-04-20 | Discharge: 2022-04-20 | Disposition: A | Discharge status: Home / Self Care | Payer: Medicaid Other | Source: Ambulatory Visit | Attending: Gastroenterology | Admitting: Gastroenterology

## 2022-04-20 DIAGNOSIS — R6881 Early satiety: Secondary | ICD-10-CM | POA: Diagnosis not present

## 2022-04-20 DIAGNOSIS — R1013 Epigastric pain: Secondary | ICD-10-CM | POA: Insufficient documentation

## 2022-04-20 DIAGNOSIS — R110 Nausea: Secondary | ICD-10-CM | POA: Diagnosis not present

## 2022-04-20 DIAGNOSIS — E1165 Type 2 diabetes mellitus with hyperglycemia: Secondary | ICD-10-CM

## 2022-04-20 HISTORY — DX: Malignant (primary) neoplasm, unspecified: C80.1

## 2022-04-20 MED ORDER — TECHNETIUM TC 99M SULFUR COLLOID
2.0000 | Freq: Once | INTRAVENOUS | Status: AC | PRN
  Administered 2022-04-20: 2.2 via ORAL

## 2022-04-23 ENCOUNTER — Other Ambulatory Visit (HOSPITAL_COMMUNITY): Payer: Medicaid Other

## 2022-04-23 NOTE — Patient Instructions (Addendum)
20    Your procedure is scheduled on: 05/03/2022  Report to [facility] at     1:15 PM.  Call this number if you have problems the morning of surgery: [phone]   Remember:   Follow instructions on letter from office regarding when to stop eating and drinking        No Smoking the day of procedure      Take these medicines the morning of surgery with A SIP OF WATER: Buspar, Paxil, Protonix, and (Maxalt if needed)  No diabetic medication am of procedure  Use inhalers if needed   Do not wear jewelry, make-up or nail polish.  Do not wear lotions, powders, or perfumes. You may wear deodorant.                Do not bring valuables to the hospital.  Contacts, dentures or bridgework may not be worn into surgery.  Leave suitcase in the car. After surgery it may be brought to your room.  For patients admitted to the hospital, checkout time is 11:00 AM the day of discharge.   Patients discharged the day of surgery will not be allowed to drive home. Upper Endoscopy, Adult Upper endoscopy is a procedure to look inside the upper GI (gastrointestinal) tract. The upper GI tract is made up of: The part of the body that moves food from your mouth to your stomach (esophagus). The stomach. The first part of your small intestine (duodenum). This procedure is also called esophagogastroduodenoscopy (EGD) or gastroscopy. In this procedure, your health care provider passes a thin, flexible tube (endoscope) through your mouth and down your esophagus into your stomach. A small camera is attached to the end of the tube. Images from the camera appear on a monitor in the exam room. During this procedure, your health care provider may also remove a small piece of tissue to be sent to a lab and examined under a microscope (biopsy). Your health care provider may do an upper endoscopy to diagnose cancers of the upper GI tract. You may also have this procedure to find the cause of other conditions, such  as: Stomach pain. Heartburn. Pain or problems when swallowing. Nausea and vomiting. Stomach bleeding. Stomach ulcers. Tell a health care provider about: Any allergies you have. All medicines you are taking, including vitamins, herbs, eye drops, creams, and over-the-counter medicines. Any problems you or family members have had with anesthetic medicines. Any blood disorders you have. Any surgeries you have had. Any medical conditions you have. Whether you are pregnant or may be pregnant. What are the risks? Generally, this is a safe procedure. However, problems may occur, including: Infection. Bleeding. Allergic reactions to medicines. A tear or hole (perforation) in the esophagus, stomach, or duodenum. What happens before the procedure? Staying hydrated Follow instructions from your health care provider about hydration, which may include: Up to 4 hours before the procedure - you may continue to drink clear liquids, such as water, clear fruit juice, black coffee, and plain tea.   Medicines Ask your health care provider about: Changing or stopping your regular medicines. This is especially important if you are taking diabetes medicines or blood thinners. Taking medicines such as aspirin and ibuprofen. These medicines can thin your blood. Do not take these medicines unless your health care provider tells you to take them. Taking over-the-counter medicines, vitamins, herbs, and supplements. General instructions Plan to have someone take you home from the hospital or clinic. If you will be  going home right after the procedure, plan to have someone with you for 24 hours. Ask your health care provider what steps will be taken to help prevent infection. What happens during the procedure?  An IV will be inserted into one of your veins. You may be given one or more of the following: A medicine to help you relax (sedative). A medicine to numb the throat (local anesthetic). You will lie  on your left side on an exam table. Your health care provider will pass the endoscope through your mouth and down your esophagus. Your health care provider will use the scope to check the inside of your esophagus, stomach, and duodenum. Biopsies may be taken. The endoscope will be removed. The procedure may vary among health care providers and hospitals. What happens after the procedure? Your blood pressure, heart rate, breathing rate, and blood oxygen level will be monitored until you leave the hospital or clinic. Do not drive for 24 hours if you were given a sedative during your procedure. When your throat is no longer numb, you may be given some fluids to drink. It is up to you to get the results of your procedure. Ask your health care provider, or the department that is doing the procedure, when your results will be ready. Summary Upper endoscopy is a procedure to look inside the upper GI tract. During the procedure, an IV will be inserted into one of your veins. You may be given a medicine to help you relax. A medicine will be used to numb your throat. The endoscope will be passed through your mouth and down your esophagus. This information is not intended to replace advice given to you by your health care provider. Make sure you discuss any questions you have with your health care provider. Document Revised: 04/05/2018 Document Reviewed: 03/13/2018 Elsevier Patient Education  [place] After  Please read the instructions outlined below and refer to this sheet in the next few weeks. These discharge instructions provide you with general information on caring for yourself after you leave the hospital. Your doctor may also give you specific instructions. While your treatment has been planned according to the most current medical  practices available, unavoidable complications occasionally occur. If you have any problems or questions after discharge, please call your doctor. HOME CARE INSTRUCTIONS Activity You may resume your regular activity but move at a slower pace for the next 24 hours.  Take frequent rest periods for the next 24 hours.  Walking will help expel (get rid of) the air and reduce the bloated feeling in your abdomen.  No driving for 24 hours (because of the anesthesia (medicine) used during the test).  You may shower.  Do not  sign any important legal documents or operate any machinery for 24 hours (because of the anesthesia used during the test).  Nutrition Drink plenty of fluids.  You may resume your normal diet.  Begin with a light meal and progress to your normal diet.  Avoid alcoholic beverages for 24 hours or as instructed by your caregiver.  Medications You may resume your normal medications unless your caregiver tells you otherwise. What you can expect today You may experience abdominal discomfort such as a feeling of fullness or "gas" pains.  You may experience a sore throat for 2 to 3 days. This is normal. Gargling with salt water may help this.  Follow-up Your doctor will discuss the results of your test with you. SEEK IMMEDIATE MEDICAL CARE IF: You have excessive nausea (feeling sick to your stomach) and/or vomiting.  You have severe abdominal pain and distention (swelling).  You have trouble swallowing.  You have a temperature over 100 F (37.8 C).  You have rectal bleeding or vomiting of blood.  Document Released: 05/25/2004 Document Revised: 09/30/2011 Document Reviewed: 12/06/2007

## 2022-04-28 ENCOUNTER — Encounter (HOSPITAL_COMMUNITY)
Admission: RE | Admit: 2022-04-28 | Discharge: 2022-04-28 | Disposition: A | Discharge status: Home / Self Care | Payer: Medicaid Other | Source: Ambulatory Visit | Attending: Internal Medicine | Admitting: Internal Medicine

## 2022-05-03 ENCOUNTER — Ambulatory Visit (HOSPITAL_BASED_OUTPATIENT_CLINIC_OR_DEPARTMENT_OTHER): Payer: Medicaid Other | Admitting: Anesthesiology

## 2022-05-03 ENCOUNTER — Ambulatory Visit (HOSPITAL_COMMUNITY)
Admission: RE | Admit: 2022-05-03 | Discharge: 2022-05-03 | Disposition: A | Discharge status: Home / Self Care | Payer: Medicaid Other | Attending: Internal Medicine | Admitting: Internal Medicine

## 2022-05-03 ENCOUNTER — Encounter (HOSPITAL_COMMUNITY): Payer: Self-pay

## 2022-05-03 ENCOUNTER — Other Ambulatory Visit: Payer: Self-pay

## 2022-05-03 ENCOUNTER — Encounter (HOSPITAL_COMMUNITY)
Admission: RE | Disposition: A | Discharge status: Home / Self Care | Payer: Self-pay | Source: Home / Self Care | Attending: Internal Medicine

## 2022-05-03 ENCOUNTER — Other Ambulatory Visit: Payer: Self-pay | Admitting: Family Medicine

## 2022-05-03 ENCOUNTER — Ambulatory Visit (HOSPITAL_COMMUNITY): Payer: Medicaid Other | Admitting: Anesthesiology

## 2022-05-03 DIAGNOSIS — D869 Sarcoidosis, unspecified: Secondary | ICD-10-CM

## 2022-05-03 DIAGNOSIS — E1165 Type 2 diabetes mellitus with hyperglycemia: Secondary | ICD-10-CM

## 2022-05-03 DIAGNOSIS — K2970 Gastritis, unspecified, without bleeding: Secondary | ICD-10-CM | POA: Insufficient documentation

## 2022-05-03 DIAGNOSIS — C439 Malignant melanoma of skin, unspecified: Secondary | ICD-10-CM

## 2022-05-03 DIAGNOSIS — R194 Change in bowel habit: Secondary | ICD-10-CM | POA: Insufficient documentation

## 2022-05-03 DIAGNOSIS — F418 Other specified anxiety disorders: Secondary | ICD-10-CM | POA: Diagnosis not present

## 2022-05-03 DIAGNOSIS — R110 Nausea: Secondary | ICD-10-CM

## 2022-05-03 DIAGNOSIS — K589 Irritable bowel syndrome without diarrhea: Secondary | ICD-10-CM | POA: Insufficient documentation

## 2022-05-03 DIAGNOSIS — M25552 Pain in left hip: Secondary | ICD-10-CM

## 2022-05-03 DIAGNOSIS — R1310 Dysphagia, unspecified: Secondary | ICD-10-CM | POA: Diagnosis not present

## 2022-05-03 DIAGNOSIS — K649 Unspecified hemorrhoids: Secondary | ICD-10-CM

## 2022-05-03 DIAGNOSIS — R1013 Epigastric pain: Secondary | ICD-10-CM | POA: Diagnosis present

## 2022-05-03 DIAGNOSIS — R6881 Early satiety: Secondary | ICD-10-CM | POA: Insufficient documentation

## 2022-05-03 DIAGNOSIS — R1032 Left lower quadrant pain: Secondary | ICD-10-CM | POA: Diagnosis not present

## 2022-05-03 DIAGNOSIS — G43709 Chronic migraine without aura, not intractable, without status migrainosus: Secondary | ICD-10-CM

## 2022-05-03 DIAGNOSIS — R52 Pain, unspecified: Secondary | ICD-10-CM

## 2022-05-03 DIAGNOSIS — I10 Essential (primary) hypertension: Secondary | ICD-10-CM | POA: Insufficient documentation

## 2022-05-03 DIAGNOSIS — Z79899 Other long term (current) drug therapy: Secondary | ICD-10-CM | POA: Diagnosis not present

## 2022-05-03 DIAGNOSIS — K625 Hemorrhage of anus and rectum: Secondary | ICD-10-CM

## 2022-05-03 DIAGNOSIS — K219 Gastro-esophageal reflux disease without esophagitis: Secondary | ICD-10-CM | POA: Diagnosis not present

## 2022-05-03 DIAGNOSIS — D849 Immunodeficiency, unspecified: Secondary | ICD-10-CM

## 2022-05-03 DIAGNOSIS — F32A Depression, unspecified: Secondary | ICD-10-CM

## 2022-05-03 HISTORY — PX: ESOPHAGOGASTRODUODENOSCOPY (EGD) WITH PROPOFOL: SHX5813

## 2022-05-03 HISTORY — PX: BALLOON DILATION: SHX5330

## 2022-05-03 HISTORY — PX: BIOPSY: SHX5522

## 2022-05-03 LAB — GLUCOSE, CAPILLARY: Glucose-Capillary: 141 mg/dL — ABNORMAL HIGH (ref 70–99)

## 2022-05-03 SURGERY — ESOPHAGOGASTRODUODENOSCOPY (EGD) WITH PROPOFOL
Anesthesia: General

## 2022-05-03 MED ORDER — LACTATED RINGERS IV SOLN: INTRAVENOUS | Status: DC

## 2022-05-03 MED ORDER — LIDOCAINE 2% (20 MG/ML) 5 ML SYRINGE
INTRAMUSCULAR | Status: DC | PRN
  Administered 2022-05-03: 100 mg via INTRAVENOUS

## 2022-05-03 MED ORDER — PROPOFOL 10 MG/ML IV BOLUS
INTRAVENOUS | Status: DC | PRN
  Administered 2022-05-03: 40 mg via INTRAVENOUS
  Administered 2022-05-03: 20 mg via INTRAVENOUS
  Administered 2022-05-03: 50 mg via INTRAVENOUS
  Administered 2022-05-03: 80 mg via INTRAVENOUS

## 2022-05-03 NOTE — Interval H&P Note (Signed)
History and Physical Interval Note:  05/03/2022 11:06 AM  Courtney Hale  has presented today for surgery, with the diagnosis of dysphagia, gerd, epigastric pain, nausea, early satiety.  The various methods of treatment have been discussed with the patient and family. After consideration of risks, benefits and other options for treatment, the patient has consented to  Procedure(s) with comments: ESOPHAGOGASTRODUODENOSCOPY (EGD) WITH PROPOFOL (N/A) - 2:45pm BALLOON DILATION (N/A) as a surgical intervention.  The patient's history has been reviewed, patient examined, no change in status, stable for surgery.  I have reviewed the patient's chart and labs.  Questions were answered to the patient's satisfaction.     Eloise Harman

## 2022-05-03 NOTE — Transfer of Care (Signed)
Immediate Anesthesia Transfer of Care Note  Patient: Courtney Hale  Procedure(s) Performed: ESOPHAGOGASTRODUODENOSCOPY (EGD) WITH PROPOFOL BALLOON DILATION BIOPSY  Patient Location: Short Stay  Anesthesia Type:MAC  Level of Consciousness: awake, alert  and oriented  Airway & Oxygen Therapy: Patient Spontanous Breathing  Post-op Assessment: Report given to RN and Post -op Vital signs reviewed and stable  Post vital signs: Reviewed and stable  Last Vitals:  Vitals Value Taken Time  BP 100/58 05/03/22 1156  Temp 36.5 C 05/03/22 1156  Pulse 71 05/03/22 1156  Resp 18 05/03/22 1156  SpO2 94 % 05/03/22 1156    Last Pain:  Vitals:   05/03/22 1156  TempSrc: Oral  PainSc: 0-No pain         Complications: No notable events documented.

## 2022-05-03 NOTE — Discharge Instructions (Signed)
EGD Discharge instructions Please read the instructions outlined below and refer to this sheet in the next few weeks. These discharge instructions provide you with general information on caring for yourself after you leave the hospital. Your doctor may also give you specific instructions. While your treatment has been planned according to the most current medical practices available, unavoidable complications occasionally occur. If you have any problems or questions after discharge, please call your doctor. ACTIVITY You may resume your regular activity but move at a slower pace for the next 24 hours.  Take frequent rest periods for the next 24 hours.  Walking will help expel (get rid of) the air and reduce the bloated feeling in your abdomen.  No driving for 24 hours (because of the anesthesia (medicine) used during the test).  You may shower.  Do not sign any important legal documents or operate any machinery for 24 hours (because of the anesthesia used during the test).  NUTRITION Drink plenty of fluids.  You may resume your normal diet.  Begin with a light meal and progress to your normal diet.  Avoid alcoholic beverages for 24 hours or as instructed by your caregiver.  MEDICATIONS You may resume your normal medications unless your caregiver tells you otherwise.  WHAT YOU CAN EXPECT TODAY You may experience abdominal discomfort such as a feeling of fullness or "gas" pains.  FOLLOW-UP Your doctor will discuss the results of your test with you.  SEEK IMMEDIATE MEDICAL ATTENTION IF ANY OF THE FOLLOWING OCCUR: Excessive nausea (feeling sick to your stomach) and/or vomiting.  Severe abdominal pain and distention (swelling).  Trouble swallowing.  Temperature over 101 F (37.8 C).  Rectal bleeding or vomiting of blood.   Your EGD revealed mild amount inflammation in your stomach.  I took biopsies of this to rule out infection with a bacteria called H. pylori.  Await pathology results, my  office will contact you.  I did stretch your esophagus today.  Hopefully this helps with your swallowing.  Continue on pantoprazole twice daily.  Follow-up with GI in 3 to 4 months.    I hope you have a great rest of your week!  Elon Alas. Abbey Chatters, D.O. Gastroenterology and Hepatology [facility]

## 2022-05-03 NOTE — Anesthesia Preprocedure Evaluation (Signed)
Anesthesia Evaluation  Patient identified by MRN, date of birth, ID band Patient awake    Reviewed: Allergy & Precautions, H&P , NPO status , Patient's Chart, lab work & pertinent test results, reviewed documented beta blocker date and time   History of Anesthesia Complications (+) AWARENESS UNDER ANESTHESIA and history of anesthetic complications  Airway Mallampati: II  TM Distance: >3 FB Neck ROM: full    Dental no notable dental hx.    Pulmonary neg pulmonary ROS,    Pulmonary exam normal breath sounds clear to auscultation       Cardiovascular Exercise Tolerance: Good hypertension, negative cardio ROS   Rhythm:regular Rate:Normal     Neuro/Psych  Headaches, PSYCHIATRIC DISORDERS Anxiety Depression    GI/Hepatic Neg liver ROS, GERD  Medicated,  Endo/Other  negative endocrine ROSdiabetes, Type 2  Renal/GU negative Renal ROS  negative genitourinary   Musculoskeletal negative musculoskeletal ROS (+)   Abdominal   Peds  Hematology negative hematology ROS (+)   Anesthesia Other Findings   Reproductive/Obstetrics negative OB ROS                             Anesthesia Physical  Anesthesia Plan  ASA: 2  Anesthesia Plan: General   Post-op Pain Management:    Induction: Intravenous  PONV Risk Score and Plan: TIVA and Propofol infusion  Airway Management Planned: Nasal Cannula  Additional Equipment:   Intra-op Plan:   Post-operative Plan:   Informed Consent: I have reviewed the patients History and Physical, chart, labs and discussed the procedure including the risks, benefits and alternatives for the proposed anesthesia with the patient or authorized representative who has indicated his/her understanding and acceptance.     Dental advisory given  Plan Discussed with: CRNA  Anesthesia Plan Comments:         Anesthesia Quick Evaluation

## 2022-05-03 NOTE — Op Note (Addendum)
[facility] Patient Name: Courtney Hale Procedure Date: 05/03/2022 11:28 AM MRN: [id number] Date of Birth: 12-22-1965 Attending MD: Elon Alas. Abbey Chatters DO CSN: [id number] Age: 56 Admit Type: Outpatient Procedure:                Upper GI endoscopy Indications:              Epigastric abdominal pain, Dysphagia, Nausea Providers:                Elon Alas. Abbey Chatters, DO, Crystal Page, Randa Spike, Technician Referring MD:              Medicines:                See the Anesthesia note for documentation of the                            administered medications Complications:            No immediate complications. Estimated Blood Loss:     Estimated blood loss was minimal. Procedure:                Pre-Anesthesia Assessment:                           - The anesthesia plan was to use monitored                            anesthesia care (MAC).                           After obtaining informed consent, the endoscope was                            passed under direct vision. Throughout the                            procedure, the patient's blood pressure, pulse, and                            oxygen saturations were monitored continuously. The                            GIF-H190 ([id number]) scope was introduced through the                            mouth, and advanced to the second part of duodenum.                            The upper GI endoscopy was accomplished without                            difficulty. The patient tolerated the procedure                            well. Scope In: 11:47:15  AM Scope Out: 11:52:06 AM Total Procedure Duration: 0 hours 4 minutes 51 seconds  Findings:      The Z-line was regular and was found 38 cm from the incisors.      No endoscopic abnormality was evident in the esophagus to explain the       patient's complaint of dysphagia. Preparations were made for empiric       dilation. A TTS dilator was passed through the  scope. Dilation with an       18-19-20 mm balloon dilator was performed to 20 mm. Dilation was       performed with a mild resistance at 20 mm Estimated blood loss was none.      Localized mild inflammation characterized by erythema and linear       erosions was found in the gastric antrum. Biopsies were taken with a       cold forceps for Helicobacter pylori testing.      The duodenal bulb, first portion of the duodenum and second portion of       the duodenum were normal. Impression:               - Z-line regular, 38 cm from the incisors.                           - Gastritis. Biopsied.                           - Normal duodenal bulb, first portion of the                            duodenum and second portion of the duodenum. Moderate Sedation:      Per Anesthesia Care Recommendation:           - Patient has a contact number available for                            emergencies. The signs and symptoms of potential                            delayed complications were discussed with the                            patient. Return to normal activities tomorrow.                            Written discharge instructions were provided to the                            patient.                           - Resume previous diet.                           - Continue present medications.                           - Await pathology results.                           -  Repeat upper endoscopy PRN for retreatment.                           - No ibuprofen, naproxen, or other non-steroidal                            anti-inflammatory drugs.                           - Return to GI clinic in 4 months.                           - Use Protonix (pantoprazole) 40 mg PO BID.                           - Consider further evaluation if MBSS or MBE Procedure Code(s):        --- Professional ---                           [phone], Esophagogastroduodenoscopy, flexible,                            transoral; with  biopsy, single or multiple Diagnosis Code(s):        --- Professional ---                           K29.70, Gastritis, unspecified, without bleeding                           R10.13, Epigastric pain                           R13.10, Dysphagia, unspecified                           R11.0, Nausea CPT copyright 2019 American Medical Association. All rights reserved. The codes documented in this report are preliminary and upon coder review may  be revised to meet current compliance requirements. Elon Alas. Abbey Chatters, DO [place] Abbey Chatters, DO 05/03/2022 11:54:25 AM This report has been signed electronically. Number of Addenda: 0

## 2022-05-03 NOTE — Anesthesia Postprocedure Evaluation (Signed)
Anesthesia Post Note  Patient: Courtney Hale  Procedure(s) Performed: ESOPHAGOGASTRODUODENOSCOPY (EGD) WITH PROPOFOL BALLOON DILATION BIOPSY  Patient location during evaluation: PACU Anesthesia Type: General Level of consciousness: awake and alert Pain management: pain level controlled Vital Signs Assessment: post-procedure vital signs reviewed and stable Respiratory status: spontaneous breathing, nonlabored ventilation, respiratory function stable and patient connected to nasal cannula oxygen Cardiovascular status: blood pressure returned to baseline and stable Postop Assessment: no apparent nausea or vomiting Anesthetic complications: no   There were no known notable events for this encounter.   Last Vitals:  Vitals:   05/03/22 1056 05/03/22 1156  BP: 116/79 (!) 100/58  Pulse: 70 71  Resp: 14 18  Temp: 36.9 C 36.5 C  SpO2: 95% 94%    Last Pain:  Vitals:   05/03/22 1156  TempSrc: Oral  PainSc: 0-No pain                 Trixie Rude

## 2022-05-04 NOTE — Progress Notes (Unsigned)
   Alpha Banding Procedure Note:   Courtney Hale is a 56 y.o. female presenting today for consideration of hemorrhoid banding. Last colonoscopy 11/16/21 - fair preparation of the colon, non-bleeding internal hemorrhoids.  She has a bowel movement about every 2-3 days. Stools have been more firm recently. Does have to strain from time to time and spends about 10 minutes or more on the toilet to defecate. Typical symptoms are intermittent bleeding, itching, and burning. Hemorrhoids sometimes protrude but go back in themselves. Not taking any fiber supplements.   The patient presents with symptomatic grade 2 hemorrhoids, unresponsive to maximal medical therapy, requesting rubber band ligation of his/her hemorrhoidal disease. All risks, benefits, and alternative forms of therapy were described and informed consent was obtained.  The decision was made to band the right posterior internal hemorrhoid, and the [place] was used to perform band ligation without complication. Digital anorectal examination was then performed to assure proper positioning of the band, and to adjust the banded tissue as required. The patient was discharged home without pain or other issues. Dietary and behavioral recommendations were given and (if necessary prescriptions were given), along with follow-up instructions. The patient will return in 3 weeks for followup and possible additional banding as required.  No complications were encountered and the patient tolerated the procedure well.    Venetia Night, MSN, FNP-BC, AGACNP-BC [facility]

## 2022-05-05 LAB — SURGICAL PATHOLOGY

## 2022-05-06 ENCOUNTER — Encounter: Payer: Self-pay | Admitting: Gastroenterology

## 2022-05-06 ENCOUNTER — Ambulatory Visit (INDEPENDENT_AMBULATORY_CARE_PROVIDER_SITE_OTHER): Payer: Medicaid Other | Admitting: Gastroenterology

## 2022-05-06 VITALS — BP 117/73 | HR 65 | Temp 99.0°F | Ht 63.0 in | Wt 185.0 lb

## 2022-05-06 DIAGNOSIS — K641 Second degree hemorrhoids: Secondary | ICD-10-CM | POA: Diagnosis not present

## 2022-05-06 NOTE — Patient Instructions (Signed)
I want you to take fiber supplements about 3 times a day.  Continue to avoid straining.   Limit toilet time to 2-3 minutes at the most.   Avoid constipation. Take 2 tablespoons of natural wheat bran, natural oat bran, flax, Benefiber or any over the counter fiber supplement and increase your water intake to 7-8 glasses daily.  Occasionally, you may have more bleeding than usual after the banding procedure. This is often from the untreated hemorrhoids rather than the treated one. Don't be concerned if there is a tablespoon or so of blood. If there is more blood than this, lie flat with your bottom higher than your head and apply an ice pack to the area. If the bleeding does not stop within a half an hour or if you feel faint, have severe pain, chills, fever or difficulty passing urine (very rare) or other problems, you should call us at [phone] or report to the nearest emergency room.call our office at [phone] or go to the emergency room.  Please call me with any concerns!  The procedure you have had should have been relatively painless since the banding of the area involved does not have nerve endings and there is no pain sensation. The rubber band cuts off the blood supply to the hemorrhoid and the band may fall off as soon as 48 hours after the banding (the band may occasionally be seen in the toilet bowl following a bowel movement). You may notice a temporary feeling of fullness in the rectum which should respond adequately to plain Tylenol or Motrin.  I will see you back in follow-up and/or for additional banding.   Venetia Night, MSN, FNP-BC, AGACNP-BC [facility]

## 2022-05-07 ENCOUNTER — Encounter (HOSPITAL_COMMUNITY): Payer: Self-pay | Admitting: Internal Medicine

## 2022-05-12 ENCOUNTER — Telehealth: Payer: Self-pay | Admitting: Pulmonary Disease

## 2022-05-12 NOTE — Telephone Encounter (Signed)
Pharmacy states they are having a hard time getting methotrexate and would like an alternative ordered.   Please advise

## 2022-05-12 NOTE — Telephone Encounter (Signed)
Can we clarify the issue that pharmacy is having? Cost? Shortage?

## 2022-05-13 NOTE — Telephone Encounter (Signed)
[facility] [place] at [facility]. Line was busy. Will try to call back again.

## 2022-05-17 ENCOUNTER — Encounter: Payer: Self-pay | Admitting: Orthopedic Surgery

## 2022-05-17 ENCOUNTER — Other Ambulatory Visit: Payer: Self-pay

## 2022-05-17 ENCOUNTER — Ambulatory Visit: Payer: Medicaid Other | Admitting: Orthopedic Surgery

## 2022-05-17 DIAGNOSIS — M76829 Posterior tibial tendinitis, unspecified leg: Secondary | ICD-10-CM

## 2022-05-17 DIAGNOSIS — M76822 Posterior tibial tendinitis, left leg: Secondary | ICD-10-CM | POA: Diagnosis not present

## 2022-05-17 MED ORDER — FREESTYLE LIBRE 2 SENSOR MISC: 3 refills | Status: DC

## 2022-05-17 NOTE — Telephone Encounter (Signed)
Dr Loanne Drilling,  Can you send in the Rx for the Methotrexate. Pharmacy says they have a few pills in stock its just from a different supplier. They state that they can do a months worth for the patient.  Please advise Dr Loanne Drilling

## 2022-05-17 NOTE — Progress Notes (Signed)
FOLLOW UP   Encounter Diagnosis  Name Primary?   PTTD (posterior tibial tendon dysfunction) left  Yes     Chief Complaint  Patient presents with   Ankle Pain    LT ankle/foot Improving however sore to touch, color discoloration Was able to come out of boot     Probably has PTTD came out of the boot currently on naproxen  Unfortunately GI doctor took her off of naproxen she cannot take prednisone either  Double leg tiptoe test pain single-leg tiptoe test left side weak  Recommend PT  Follow-up 6 weeks

## 2022-05-17 NOTE — Patient Instructions (Signed)
Physical therapy has been ordered for you at . They should call you to schedule, [phone] is the phone number to call, if you want to call to schedule.   

## 2022-05-18 MED ORDER — METHOTREXATE 2.5 MG PO TABS: 20.0000 mg | ORAL_TABLET | ORAL | 1 refills | Status: DC

## 2022-05-18 NOTE — Telephone Encounter (Signed)
Generic methotrexate 20 mg weekly ordered. (1 month supply with one refill). Note written to pharmacy that substitute or generic is ok.  The previous order of methotrexate does not specify a brand. If there are any future issues with prescription please ask if there is a specific order the pharmacy needs in order to fill for patient.

## 2022-05-24 NOTE — Progress Notes (Unsigned)
Office Visit Note  Patient: Courtney Hale             Date of Birth: 05-04-1966           MRN: [id number]             PCP: Tommie Sams, DO Referring: Tommie Sams, DO Visit Date: 05/31/2022   Subjective:  Follow-up (Pain all over, change in normal activities, stiffness.)   History of Present Illness: Courtney Hale is a 56 y.o. female here for follow up sarcoidosis on methotrexate 20 mg PO weekly.   Previous HPI 03/23/2022 Courtney Hale is a 56 y.o. female here for follow up for sarcoidosis on methotrexate 20 mg PO weekly. Pulmonary and osseus involvement with possible previous inflammatory eye disease and had nuclear medicine cardiac study earlier today looking for inflammation. Symptoms remain about the same left hip is hurting possibly worse than before continuing methotrexate as before. She notices somewhat increased left foot swelling and pain with walking. Labs at last visit were negative for serum inflammatory markers and RF.    Previous HPI 02/24/2022 Courtney Hale is a 56 y.o. 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 Symptoms started with dyspnea on exertion and then resting dyspnea leading to evaluation with numerous pulmonary nodules. Multiple bone lesions were present on imaging since at least 11/2020 found to be hypermetabolic on PET imaging and biopsy confirming noncaseating granulomas. She has history of previously resected melanomas but no concerning pathology. She is on prednisone since October 2022, methotrexate was started in February and prednisone tapering.  So far she has not noticed a significant difference in symptoms with any of these medications.  She has pretty much generalized body pains although hip problems are her worst.  Lab testing maintains normal renal function and no hypercalcemia. Ophthalmology exam showing glaucoma no uveitis or  iritis.   Review of Systems  Constitutional:  Positive for fatigue.  HENT:  Positive for mouth sores and mouth dryness.   Eyes:  Positive for dryness.  Respiratory:  Positive for shortness of breath.   Cardiovascular:  Positive for chest pain. Negative for palpitations.  Gastrointestinal:  Positive for blood in stool, constipation and diarrhea.  Endocrine: Positive for increased urination.  Genitourinary:  Negative for involuntary urination.  Musculoskeletal:  Positive for joint pain, joint pain, myalgias, muscle weakness, morning stiffness, muscle tenderness and myalgias. Negative for joint swelling.  Skin:  Positive for color change, hair loss and sensitivity to sunlight. Negative for rash.  Allergic/Immunologic: Negative for susceptible to infections.  Neurological:  Positive for dizziness and headaches.  Hematological:  Negative for swollen glands.  Psychiatric/Behavioral:  Positive for depressed mood and sleep disturbance. The patient is nervous/anxious.     PMFS History:  Patient Active Problem List   Diagnosis Date Noted   Hand swelling 05/31/2022   Abdominal pain, epigastric 04/14/2022   LLQ abdominal pain 04/14/2022   Dysphagia 04/14/2022   Hemorrhoids 04/14/2022   Nausea without vomiting 04/14/2022   Encounter for pain management 03/03/2022   Generalized pain 02/24/2022   High risk medication use 01/06/2022   Immunosuppression (HCC) 11/16/2021   Uncontrolled type 2 diabetes mellitus with hyperglycemia (HCC) 10/03/2021   Sarcoidosis 08/14/2021   Pain in left hip 03/09/2021   Rectal bleeding 04/13/2018   Gastroesophageal reflux disease without esophagitis 07/27/2017   Anxiety and depression 12/18/2016   IBS (irritable bowel syndrome)  03/18/2016   Melanoma of skin (HCC) 03/18/2016   Change in bowel habits    Headache, chronic migraine without aura 01/31/2013   Hypertension 01/31/2013    Past Medical History:  Diagnosis Date   Cancer (HCC)    Skin   Complication  of anesthesia    woke up during endoscopy and colonoscopy   Depression    Diabetes (HCC)    GERD (gastroesophageal reflux disease)    Hemorrhoids    History of esophageal stricture    2011  peptic w/  dilatation   History of gastritis    2011   History of melanoma excision    2015  left leg/   12-09-2015 back    Hypertension    Microhematuria    Migraines    Nephrolithiasis    left side non-obstructive    Right ureteral stone    Sarcoidosis 07/24/2021   Urgency of urination    Wears contact lenses     Family History  Problem Relation Age of Onset   Hypertension Mother    Thyroid disease Mother    Diabetes Mother    Hyperlipidemia Mother    Stroke Mother    Heart disease Mother    Hyperlipidemia Father    Hypertension Father    Heart attack Father    Heart failure Father    Colon cancer Neg Hx    Past Surgical History:  Procedure Laterality Date   ABDOMINAL HYSTERECTOMY     BALLOON DILATION N/A 05/03/2022   Procedure: BALLOON DILATION;  Surgeon: Lanelle Bal, DO;  Location: [facility];  Service: Endoscopy;  Laterality: N/A;   BIOPSY  11/16/2021   Procedure: BIOPSY;  Surgeon: Lanelle Bal, DO;  Location: [facility];  Service: Endoscopy;;   BIOPSY  05/03/2022   Procedure: BIOPSY;  Surgeon: Lanelle Bal, DO;  Location: [facility];  Service: Endoscopy;;   CARPAL TUNNEL RELEASE Right 10/29/2013   COLONOSCOPY N/A 08/27/2015   Procedure: COLONOSCOPY;  Surgeon: West Bali, MD;  Location: [facility];  Service: Endoscopy;  Laterality: N/A;  0830   COLONOSCOPY WITH PROPOFOL N/A 11/16/2021   Surgeon: Lanelle Bal, DO;  Nonbleeding internal hemorrhoids, otherwise normal exam.  Recommended 10-year screening colonoscopy.   CYST EXCISION  10/25/2010   right hand   CYSTOSCOPY W/ URETERAL STENT PLACEMENT Right 12/22/2015   Procedure: CYSTOSCOPY WITH RETROGRADE PYELOGRAM/URETERAL STENT PLACEMENT;  Surgeon: Hildred Laser, MD;  Location: [facility];   Service: Urology;  Laterality: Right;   CYSTOSCOPY WITH RETROGRADE PYELOGRAM, URETEROSCOPY AND STENT PLACEMENT Right 01/05/2016   Procedure: CYSTOSCOPY WITH RETROGRADE PYELOGRAM, URETEROSCOPY AND STENT REPLACEMENT;  Surgeon: Hildred Laser, MD;  Location: [facility];  Service: Urology;  Laterality: Right;   DILATION AND CURETTAGE OF UTERUS  03/12/2009   w/  Suction   double balloon enteroscopy     Dr. Gwinda Passe at [facility]: no erosions, no evidence of Crohn's disease, path without Crohn's.    EGD with push enteroscopy  02/12/2010    patent distal peptic stricture with diffuse antral erythema, normal D1 and D2    ESOPHAGOGASTRODUODENOSCOPY (EGD) WITH PROPOFOL N/A 11/16/2021   Surgeon: Lanelle Bal, DO;   Gastritis biopsied, otherwise normal exam.  Pathology with mild chronic gastritis, negative for H. pylori.   ESOPHAGOGASTRODUODENOSCOPY (EGD) WITH PROPOFOL N/A 05/03/2022   Procedure: ESOPHAGOGASTRODUODENOSCOPY (EGD) WITH PROPOFOL;  Surgeon: Lanelle Bal, DO;  Location: [facility];  Service: Endoscopy;  Laterality: N/A;  2:45pm   HAND SURGERY     LAPAROSCOPIC ASSISTED VAGINAL HYSTERECTOMY  10/26/2014   w/  Bilateral Salpingoophorectomy   STONE EXTRACTION WITH BASKET Right 01/05/2016   Procedure: STONE EXTRACTION WITH BASKET;  Surgeon: Nickie Retort, MD;  Location: [facility];  Service: Urology;  Laterality: Right;   TONSILLECTOMY  1998  approx   Social History   Social History Narrative   Not on file   Immunization History  Administered Date(s) Administered   Influenza,inj,Quad PF,6+ Mos 09/09/2016, 08/29/2017, 09/27/2019, 10/30/2020, 08/14/2021   Influenza-Unspecified 08/22/2013, 07/22/2014, 08/08/2015   MMR 01/22/2011   PFIZER(Purple Top)SARS-COV-2 Vaccination 12/30/2019, 01/20/2020   Tdap 01/22/2011, 04/17/2014     Objective: Vital Signs: BP 126/85 (BP Location: Right Arm, Patient Position: Sitting, Cuff Size: Normal)   Pulse 67    Resp 14   Ht 5' 3.5" (1.613 m)   Wt 183 lb 9.6 oz (83.3 kg)   LMP 10/26/2014   BMI 32.01 kg/m    Physical Exam Constitutional:      Appearance: She is obese.  Eyes:     Conjunctiva/sclera: Conjunctivae normal.  Cardiovascular:     Rate and Rhythm: Normal rate and regular rhythm.  Pulmonary:     Effort: Pulmonary effort is normal.     Breath sounds: Normal breath sounds.  Musculoskeletal:     Right lower leg: No edema.     Left lower leg: No edema.  Lymphadenopathy:     Cervical: No cervical adenopathy.  Skin:    General: Skin is warm and dry.     Findings: No rash.  Neurological:     Mental Status: She is alert.  Psychiatric:        Behavior: Behavior normal.        Thought Content: Thought content normal.     Comments: Briefly tearful discussing symptoms and impact on activities     Musculoskeletal Exam:  Shoulders full ROM no tenderness or swelling Elbows full ROM no tenderness or swelling Wrists full ROM no tenderness or swelling Fingers full ROM no tenderness or swelling Lateral hip tenderness to palpation and over low back paraspinal muscle tenderness Knees full ROM no tenderness or swelling, slight right knee patellofemoral crepitus present  Investigation: No additional findings.  Imaging: XR Hand 2 View Right  Result Date: 05/31/2022 X-ray right hand 2 views Radiocarpal and carpal joint spaces appear normal.  MCP joint spaces appear normal.  Minimal PIP and DIP joint spaces with cyst and possible very early marginal osteophyte formation.  No soft tissue swelling no abnormal calcifications or erosive changes are seen. 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  Few cystic changes otherwise MCP joint spaces appear normal.  PIP and DIP joints appear normal.  No soft tissue swelling no  abnormal calcifications and bone mineralization appears normal. Impression No significant arthritis changes seen  XR KNEE 3 VIEW RIGHT  Result Date: 05/31/2022 X-ray right knee 3 views Medial and lateral compartment joint spaces appear well-preserved.  May be some slight varus deformity no marginal osteophytes seen.  No visible effusions abnormal calcifications or erosions seen. Impression No significant arthritis changes or soft tissue swelling seen  XR KNEE 3 VIEW LEFT  Result Date: 05/31/2022 X-ray left knee 3 views Medial and lateral compartment joint spaces appear well-preserved.  Slight increased subchondral sclerosis on  the medial surface.  No osteophytes.  There are small patellar enthesophyte at the superior border.  Patellofemoral joint space appears well-preserved.  No visible effusions abnormal calcifications or erosions. Impression Probable chronic patellar tendinitis changes otherwise minimal possible early medial compartment osteoarthritis changes   Recent Labs: Lab Results  Component Value Date   WBC 7.9 04/19/2022   HGB 12.7 04/19/2022   PLT 253.0 04/19/2022   NA 139 04/19/2022   K 3.0 (L) 04/19/2022   CL 100 04/19/2022   CO2 27 04/19/2022   GLUCOSE 151 (H) 04/19/2022   BUN 13 04/19/2022   CREATININE 0.63 04/19/2022   BILITOT 0.5 04/19/2022   ALKPHOS 59 04/19/2022   AST 26 04/19/2022   ALT 32 04/19/2022   PROT 6.5 04/19/2022   ALBUMIN 4.3 04/19/2022   CALCIUM 9.5 04/19/2022   GFRAA 121 08/27/2020   QFTBGOLDPLUS Negative 11/27/2021    Speciality Comments: No specialty comments available.  Procedures:  No procedures performed Allergies: Topamax [topiramate], Fluorouracil, and Sulfa antibiotics   Assessment / Plan:     Visit Diagnoses: Sarcoidosis - Plan: XR Hand 2 View Right, XR Hand 2 View Left, XR KNEE 3 VIEW RIGHT, XR KNEE 3 VIEW LEFT, Sedimentation rate, C-reactive protein  Ongoing joint pains and decreased activity tolerance but not much active synovitis  edema or skin inflammation on exam today.  Appears to be well controlled from pulmonary standpoint.  Checking sed rate and CRP for inflammatory disease activity monitoring.  We will obtain bilateral hand x-rays and updated knee x-ray today assess for inflammatory changes or if there is underlying osteoarthritis for her symptoms.  Bone scan may be another option to reassess the disease activity on treatment to determine if we need to push for more aggressive immunosuppression.  High risk medication use - methotrexate 20 mg p.o. weekly and folic acid 1 mg daily.  She had recent blood count and metabolic panel for methotrexate toxicity monitoring those were within normal.  No new infections or medication intolerance reported.  She is describing some dark discolored skin hematemesis and stool discoloration concerning for possible inflammation or bleeding with increased daily NSAID use.  Pain in left hip Generalized pain - Plan: XR Hand 2 View Right, XR Hand 2 View Left, XR KNEE 3 VIEW RIGHT, XR KNEE 3 VIEW LEFT  Is having a lot of pain in multiple areas somewhat out of proportion to inflammatory results and exam findings.  Some question about fibromyalgia syndrome she would be criteria with issues of headaches concentration and recall difficulty in.  Sleep irritable bowels and nausea and widespread pain throughout upper and lower body.  Does have tender points throughout the upper back.  Swelling of both hands - Plan: XR Hand 2 View Right, XR Hand 2 View Left, XR KNEE 3 VIEW RIGHT, XR KNEE 3 VIEW LEFT, Sedimentation rate, C-reactive protein  No inflammatory changes present on exam today.  Hand x-rays unremarkable for characteristic sarcoidosis changes.  Orders: Orders Placed This Encounter  Procedures   XR Hand 2 View Right   XR Hand 2 View Left   XR KNEE 3 VIEW RIGHT   XR KNEE 3 VIEW LEFT   Sedimentation rate   C-reactive protein   No orders of the defined types were placed in this  encounter.    Follow-Up Instructions: No follow-ups on file.   Collier Salina, MD  Note - This record has been created using [employer].  Chart creation errors have been sought, but may not always  have been located. Such creation errors do not reflect on  the standard of medical care.

## 2022-05-27 ENCOUNTER — Other Ambulatory Visit: Payer: Self-pay | Admitting: Family Medicine

## 2022-05-31 ENCOUNTER — Ambulatory Visit (HOSPITAL_COMMUNITY): Payer: Medicaid Other | Attending: Internal Medicine | Admitting: Internal Medicine

## 2022-05-31 ENCOUNTER — Encounter: Payer: Self-pay | Admitting: Internal Medicine

## 2022-05-31 ENCOUNTER — Ambulatory Visit: Payer: Medicaid Other | Attending: Internal Medicine

## 2022-05-31 ENCOUNTER — Ambulatory Visit: Payer: Medicaid Other

## 2022-05-31 ENCOUNTER — Ambulatory Visit (INDEPENDENT_AMBULATORY_CARE_PROVIDER_SITE_OTHER): Payer: Medicaid Other

## 2022-05-31 VITALS — BP 126/85 | HR 67 | Resp 14 | Ht 63.5 in | Wt 183.6 lb

## 2022-05-31 DIAGNOSIS — D869 Sarcoidosis, unspecified: Secondary | ICD-10-CM

## 2022-05-31 DIAGNOSIS — M25562 Pain in left knee: Secondary | ICD-10-CM

## 2022-05-31 DIAGNOSIS — M7989 Other specified soft tissue disorders: Secondary | ICD-10-CM

## 2022-05-31 DIAGNOSIS — M79642 Pain in left hand: Secondary | ICD-10-CM | POA: Diagnosis not present

## 2022-05-31 DIAGNOSIS — M25561 Pain in right knee: Secondary | ICD-10-CM | POA: Diagnosis not present

## 2022-05-31 DIAGNOSIS — Z79899 Other long term (current) drug therapy: Secondary | ICD-10-CM | POA: Diagnosis not present

## 2022-05-31 DIAGNOSIS — M25552 Pain in left hip: Secondary | ICD-10-CM

## 2022-05-31 DIAGNOSIS — M79641 Pain in right hand: Secondary | ICD-10-CM | POA: Diagnosis not present

## 2022-05-31 DIAGNOSIS — R52 Pain, unspecified: Secondary | ICD-10-CM | POA: Diagnosis not present

## 2022-06-01 ENCOUNTER — Encounter: Payer: Medicaid Other | Admitting: Gastroenterology

## 2022-06-01 LAB — C-REACTIVE PROTEIN: CRP: 3.3 mg/L (ref <8.0)

## 2022-06-01 LAB — SEDIMENTATION RATE: Sed Rate: 11 mm/h (ref 0–30)

## 2022-06-01 NOTE — Patient Instructions (Signed)
I recommend checking out the [employer] patient-centered guide for fibromyalgia and chronic pain management: [email or web address].[email or web address].[email or web address].edu   

## 2022-06-04 ENCOUNTER — Other Ambulatory Visit: Payer: Self-pay

## 2022-06-04 ENCOUNTER — Encounter (HOSPITAL_COMMUNITY): Payer: Self-pay | Admitting: Emergency Medicine

## 2022-06-04 ENCOUNTER — Emergency Department (HOSPITAL_COMMUNITY)
Admission: EM | Admit: 2022-06-04 | Discharge: 2022-06-04 | Disposition: A | Discharge status: Home / Self Care | Payer: Medicaid Other | Attending: Emergency Medicine | Admitting: Emergency Medicine

## 2022-06-04 ENCOUNTER — Emergency Department (HOSPITAL_COMMUNITY): Payer: Medicaid Other

## 2022-06-04 DIAGNOSIS — E119 Type 2 diabetes mellitus without complications: Secondary | ICD-10-CM | POA: Diagnosis not present

## 2022-06-04 DIAGNOSIS — R079 Chest pain, unspecified: Secondary | ICD-10-CM | POA: Diagnosis not present

## 2022-06-04 DIAGNOSIS — R0602 Shortness of breath: Secondary | ICD-10-CM | POA: Diagnosis not present

## 2022-06-04 DIAGNOSIS — E876 Hypokalemia: Secondary | ICD-10-CM | POA: Insufficient documentation

## 2022-06-04 DIAGNOSIS — Z7984 Long term (current) use of oral hypoglycemic drugs: Secondary | ICD-10-CM | POA: Diagnosis not present

## 2022-06-04 DIAGNOSIS — R0789 Other chest pain: Secondary | ICD-10-CM | POA: Diagnosis not present

## 2022-06-04 DIAGNOSIS — Z79899 Other long term (current) drug therapy: Secondary | ICD-10-CM | POA: Diagnosis not present

## 2022-06-04 DIAGNOSIS — I10 Essential (primary) hypertension: Secondary | ICD-10-CM | POA: Diagnosis not present

## 2022-06-04 DIAGNOSIS — Z794 Long term (current) use of insulin: Secondary | ICD-10-CM | POA: Diagnosis not present

## 2022-06-04 LAB — BASIC METABOLIC PANEL
Anion gap: 9 (ref 5–15)
BUN: 7 mg/dL (ref 6–20)
CO2: 24 mmol/L (ref 22–32)
Calcium: 8.9 mg/dL (ref 8.9–10.3)
Chloride: 105 mmol/L (ref 98–111)
Creatinine, Ser: 0.7 mg/dL (ref 0.44–1.00)
GFR, Estimated: >60 mL/min (ref >60)
Glucose, Bld: 100 mg/dL — ABNORMAL HIGH (ref 70–99)
Potassium: 3.2 mmol/L — ABNORMAL LOW (ref 3.5–5.1)
Sodium: 138 mmol/L (ref 135–145)

## 2022-06-04 LAB — CBC
HCT: 35.5 % — ABNORMAL LOW (ref 36.0–46.0)
Hemoglobin: 12.5 g/dL (ref 12.0–15.0)
MCH: 30.6 pg (ref 26.0–34.0)
MCHC: 35.2 g/dL (ref 30.0–36.0)
MCV: 87 fL (ref 80.0–100.0)
Platelets: 308 10*3/uL (ref 150–400)
RBC: 4.08 MIL/uL (ref 3.87–5.11)
RDW: 14 % (ref 11.5–15.5)
WBC: 7.3 10*3/uL (ref 4.0–10.5)
nRBC: 0 % (ref 0.0–0.2)

## 2022-06-04 LAB — TROPONIN I (HIGH SENSITIVITY)
Troponin I (High Sensitivity): 3 ng/L (ref <18)
Troponin I (High Sensitivity): 3 ng/L (ref <18)

## 2022-06-04 LAB — D-DIMER, QUANTITATIVE: D-Dimer, Quant: 0.34 ug/mL-FEU (ref 0.00–0.50)

## 2022-06-04 MED ORDER — CYCLOBENZAPRINE HCL 10 MG PO TABS: 10.0000 mg | ORAL_TABLET | Freq: Two times a day (BID) | ORAL | 0 refills | Status: DC | PRN

## 2022-06-04 MED ORDER — KETOROLAC TROMETHAMINE 15 MG/ML IJ SOLN
15.0000 mg | Freq: Once | INTRAMUSCULAR | Status: AC
Start: 2022-06-04 — End: 2022-06-04
  Administered 2022-06-04: 15 mg via INTRAMUSCULAR
  Filled 2022-06-04: qty 1

## 2022-06-04 MED ORDER — OXYCODONE-ACETAMINOPHEN 5-325 MG PO TABS
2.0000 | ORAL_TABLET | Freq: Once | ORAL | Status: AC
  Administered 2022-06-04: 2 via ORAL
  Filled 2022-06-04: qty 2

## 2022-06-04 NOTE — ED Provider Notes (Signed)
[facility] EMERGENCY DEPARTMENT Provider Note   CSN: [id number] Arrival date & time: 06/04/22  1652     History  Chief Complaint  Patient presents with   Chest Pain    Courtney Hale is a 56 y.o. female.   Chest Pain Associated symptoms: shortness of breath   Associated symptoms: no abdominal pain, no cough, no fever, no headache, no nausea, no numbness, no vomiting and no weakness         Courtney Hale is a 56 y.o. female with past medical history of hypertension, migraines, diabetes, and sarcoidosis who presents to the Emergency Department complaining of left chest pain x2 days.  Describes pain as originating at her left breast and "wrapping around" to her mid back.  Describes pain as sharp in quality.  No associated cough or known injury.  Some shortness of breath with exertion.  Pain does not radiate to her neck arm or jaw.  Pain not reproducible with movement or palpation.  She denies history of prior PE or DVT.  She is a non-smoker.  No history of cardiac disease.   Home Medications Prior to Admission medications   Medication Sig Start Date End Date Taking? Authorizing Provider  albuterol (VENTOLIN HFA) 108 (90 Base) MCG/ACT inhaler Inhale 2 puffs into the lungs every 6 (six) hours as needed for wheezing or shortness of breath. 08/27/21  Yes Margaretha Seeds, MD  busPIRone (BUSPAR) 5 MG tablet TAKE 1 TABLET BY MOUTH THREE TIMES DAILY Patient taking differently: Take 5 mg by mouth 3 (three) times daily as needed (anxiety). 03/25/21  Yes Lovena Le, Malena M, DO  calcium carbonate (TUMS - DOSED IN MG ELEMENTAL CALCIUM) 500 MG chewable tablet Chew 1,500 mg by mouth 2 (two) times daily as needed for indigestion or heartburn.   Yes [provider]  Carboxymethylcellul-Glycerin (LUBRICATING EYE DROPS OP) Place 1 drop into both eyes daily as needed (dry eyes).   Yes [provider]  Chromium 1000 MCG TABS Take 1,000 mcg by mouth daily.   Yes [provider]  CINNAMON PO Take 2,000 mg by mouth daily.   Yes [provider]  Cyanocobalamin (B-12) 2000 MCG TABS Take 2,000 mcg by mouth daily.   Yes [provider]  EPINEPHrine 0.3 mg/0.3 mL IJ SOAJ injection Inject 0.3 mg into the muscle as needed for anaphylaxis. 10/16/21  Yes Loeffler, Adora Fridge, PA-C  folic acid (FOLVITE) 1 MG tablet Take 2 tablets (2 mg total) by mouth daily. Take 1 tablet daily for 14 days, then increase to 2 tablets daily thereafter. Patient taking differently: Take 2 mg by mouth daily. 02/10/22  Yes Margaretha Seeds, MD  hydrochlorothiazide (HYDRODIURIL) 25 MG tablet Take 1 tablet (25 mg total) by mouth daily. 09/25/21  Yes Cook, Jayce G, DO  HYDROcodone-acetaminophen (NORCO/VICODIN) 5-325 MG tablet Take 1 tablet by mouth every 6 (six) hours as needed for severe pain. To be used sparingly for acute, severe pain. 03/03/22  Yes Cook, Jayce G, DO  insulin aspart (NOVOLOG FLEXPEN) 100 UNIT/ML FlexPen Inject 5-11 Units into the skin 3 (three) times daily with meals. Patient taking differently: Inject 5-11 Units into the skin 3 (three) times daily as needed for high blood sugar. 02/25/22  Yes Brita Romp, NP  losartan (COZAAR) 100 MG tablet Take 1 tablet by mouth once daily 05/27/22  Yes Cook, Jayce G, DO  meclizine (ANTIVERT) 25 MG tablet Take 25 mg by mouth 3 (three) times daily as needed for  dizziness.   Yes [provider]  metFORMIN (GLUCOPHAGE) 850 MG tablet TAKE 1 TABLET BY MOUTH TWICE DAILY WITH A MEAL 05/03/22  Yes Cook, Jayce G, DO  methotrexate (RHEUMATREX) 2.5 MG tablet Take 8 tablets (20 mg total) by mouth once a week. Patient taking differently: Take 20 mg by mouth every Monday. 02/10/22  Yes Margaretha Seeds, MD  ondansetron (ZOFRAN-ODT) 4 MG disintegrating tablet Take 4 mg by mouth every 8 (eight) hours as needed for nausea or vomiting.   Yes [provider]  pantoprazole (PROTONIX) 40 MG tablet Take 1 tablet (40 mg total) by mouth 2  (two) times daily before a meal. 04/14/22  Yes Harper, Kristen S, PA-C  PARoxetine (PAXIL) 40 MG tablet TAKE 1 TABLET BY MOUTH ONCE DAILY IN THE MORNING 03/26/22  Yes Cook, Jayce G, DO  Probiotic Product (PROBIOTIC PO) Take 1 capsule by mouth daily.   Yes [provider]  prochlorperazine (COMPAZINE) 10 MG tablet Take 1 tablet (10 mg total) by mouth every 6 (six) hours as needed for nausea or vomiting. 07/13/21  Yes Derek Jack, MD  rizatriptan (MAXALT-MLT) 10 MG disintegrating tablet Take 1 tablet (10 mg total) by mouth as needed for migraine. May repeat in 2 hours if needed. Max 2 per 24 hours 05/22/20  Yes Taylor, Malena M, DO  SEMGLEE, YFGN, 100 UNIT/ML Pen INJECT 35 UNITS SUBCUTANEOUSLY EVERY NIGHT Patient taking differently: Inject 30 Units into the skin at bedtime. INJECT 30 UNITS SUBCUTANEOUSLY EVERY NIGHT 04/08/22  Yes Cook, Jayce G, DO  traZODone (DESYREL) 100 MG tablet TAKE 1 TABLET BY MOUTH AT BEDTIME AS NEEDED FOR SLEEP 03/31/22  Yes Cook, Jayce G, DO  valACYclovir (VALTREX) 500 MG tablet Take 500 mg by mouth 2 (two) times daily as needed (outbreaks). 06/05/21  Yes [provider]  BD PEN NEEDLE NANO 2ND GEN 32G X 4 MM MISC USE AS DIRECTED 04/21/22   Coral Spikes, DO  Continuous Blood Gluc Receiver (FREESTYLE LIBRE 2 READER) DEVI Use as directed to check blood sugars. 10/27/21   Coral Spikes, DO  Continuous Blood Gluc Sensor (FREESTYLE LIBRE 2 SENSOR) MISC Use sensor as directed. Change every 10-14 days. 05/17/22   Coral Spikes, DO  methotrexate (RHEUMATREX) 2.5 MG tablet Take 8 tablets (20 mg total) by mouth once a week for 28 days. Caution:Chemotherapy. Protect from light. 05/18/22 06/15/22  Margaretha Seeds, MD  naproxen (NAPROSYN) 500 MG tablet Take 1 tablet (500 mg total) by mouth 2 (two) times daily. Patient not taking: Reported on 05/31/2022 04/05/22   Carole Civil, MD      Allergies    Topamax [topiramate], Fluorouracil, and Sulfa antibiotics    Review of  Systems   Review of Systems  Constitutional:  Negative for chills and fever.  HENT:  Negative for congestion.   Respiratory:  Positive for shortness of breath. Negative for cough.   Cardiovascular:  Positive for chest pain. Negative for leg swelling.  Gastrointestinal:  Negative for abdominal pain, nausea and vomiting.  Genitourinary:  Negative for difficulty urinating and dysuria.  Musculoskeletal:  Negative for arthralgias.  Skin:  Negative for rash.  Neurological:  Negative for weakness, numbness and headaches.    Physical Exam Updated Vital Signs BP 132/85   Pulse 66   Temp 98.4 F (36.9 C) (Oral)   Resp 16   Ht 5' 3.5" (1.613 m)   Wt 81.4 kg   LMP 10/26/2014   SpO2 93%   BMI  31.30 kg/m  Physical Exam Vitals and nursing note reviewed.  Constitutional:      General: She is not in acute distress.    Appearance: Normal appearance. She is well-developed. She is not ill-appearing or toxic-appearing.  Cardiovascular:     Rate and Rhythm: Normal rate and regular rhythm.     Pulses: Normal pulses.  Pulmonary:     Effort: Pulmonary effort is normal.     Breath sounds: Normal breath sounds.  Chest:     Chest wall: No tenderness.  Abdominal:     General: There is no distension.     Palpations: Abdomen is soft.     Tenderness: There is no abdominal tenderness. There is no right CVA tenderness or left CVA tenderness.  Musculoskeletal:        General: Normal range of motion.     Right lower leg: No edema.     Left lower leg: No edema.  Skin:    General: Skin is warm.     Capillary Refill: Capillary refill takes less than 2 seconds.  Neurological:     General: No focal deficit present.     Mental Status: She is alert.     Sensory: No sensory deficit.     Motor: No weakness.     ED Results / Procedures / Treatments   Labs (all labs ordered are listed, but only abnormal results are displayed) Labs Reviewed  BASIC METABOLIC PANEL - Abnormal; Notable for the following  components:      Result Value   Potassium 3.2 (*)    Glucose, Bld 100 (*)    All other components within normal limits  CBC - Abnormal; Notable for the following components:   HCT 35.5 (*)    All other components within normal limits  D-DIMER, QUANTITATIVE  TROPONIN I (HIGH SENSITIVITY)  TROPONIN I (HIGH SENSITIVITY)    EKG None  Radiology DG Chest 2 View  Result Date: 06/04/2022 CLINICAL DATA:  Left chest pain EXAM: CHEST - 2 VIEW COMPARISON:  08/26/2021 FINDINGS: The heart size and mediastinal contours are within normal limits. Both lungs are clear. The visualized skeletal structures are unremarkable. IMPRESSION: No active cardiopulmonary disease. Electronically Signed   By: Elmer Picker M.D.   On: 06/04/2022 17:46    Procedures Procedures    Medications Ordered in ED Medications - No data to display  ED Course/ Medical Decision Making/ A&P                           Medical Decision Making Patient here with 2-day history of sharp stabbing pain of her left chest that radiates to her left mid back.  Pain has been associated with some shortness of breath as well.  No cough, fever, or recent illness.  No history of PE or DVT.  She is a non-smoker.  Pain does not appear to be reproducible with palpation or movement.  On exam, patient well-appearing nontoxic.  She has reassuring vital signs.  Her differential at this time would include but not limited to PE, pneumonia, ACS musculoskeletal injury  Amount and/or Complexity of Data Reviewed Labs: ordered.    Details: Labs interpreted by me, no evidence of leukocytosis.  Chemistries show mild hypokalemia but otherwise reassuring.  Initial troponin unremarkable.  D-dimer also reassuring at 0.34 Radiology: ordered.    Details: Chest x-ray shows no active cardiopulmonary disease ECG/medicine tests: ordered.    Details: EKG shows normal sinus rhythm with  nonspecific ST and T wave abnormality   Patient here with sharp  left-sided chest wall pain.  Has reassuring D-dimer and chest x-ray.  Initial Trope also reassuring.  Source of her chest pain unclear at this time.  Her delta troponin is pending.  Discussed findings with Ranell Patrick, PA-C who is agreeable to arrange disposition pending results.  If second troponin negative I feel that she is appropriate for discharge home and close outpatient follow-up.         Final Clinical Impression(s) / ED Diagnoses Final diagnoses:  None    Rx / DC Orders ED Discharge Orders     None         Kem Parkinson, PA-C 06/04/22 1934    Ottie Glazier, DO 06/05/22 0020

## 2022-06-04 NOTE — ED Provider Notes (Signed)
  Physical Exam  BP 129/87   Pulse 98   Temp 98.4 F (36.9 C) (Oral)   Resp 18   Ht 5' 3.5" (1.613 m)   Wt 81.4 kg   LMP 10/26/2014   SpO2 90%   BMI 31.30 kg/m   Physical Exam Vitals and nursing note reviewed.  Constitutional:      General: She is not in acute distress.    Appearance: Normal appearance.  HENT:     Head: Normocephalic and atraumatic.  Eyes:     General:        Right eye: No discharge.        Left eye: No discharge.  Cardiovascular:     Comments: Regular rate and rhythm.  S1/S2 are distinct without any evidence of murmur, rubs, or gallops.  Radial pulses are 2+ bilaterally.  Dorsalis pedis pulses are 2+ bilaterally.  No evidence of pedal edema. Pulmonary:     Comments: Clear to auscultation bilaterally.  Normal effort.  No respiratory distress.  No evidence of wheezes, rales, or rhonchi heard throughout. Chest:     Comments: Chest wall is nontender to palpation. Abdominal:     General: Abdomen is flat. Bowel sounds are normal. There is no distension.     Tenderness: There is no abdominal tenderness. There is no guarding or rebound.  Musculoskeletal:        General: Normal range of motion.     Cervical back: Neck supple.  Skin:    General: Skin is warm and dry.     Findings: No rash.  Neurological:     General: No focal deficit present.     Mental Status: She is alert.  Psychiatric:        Mood and Affect: Mood normal.        Behavior: Behavior normal.     Procedures  Procedures  ED Course / MDM    Medical Decision Making Amount and/or Complexity of Data Reviewed Labs: ordered. Radiology: ordered.  Risk Prescription drug management.   Accepted handoff at shift change from [facility]. Please see prior provider note for more detail.   Briefly: Patient is 56 y.o. female coming in with a few day history of left anterior chest wall pain that radiates laterally into the back.  This is in a bandlike distribution.  Denies rash, fever, chills,  shortness of breath.  Pain is not worse with movement.  It is worse with respirations.  She states it feels "deep."  Plan: Work-up was ultimately unrevealing.  Troponins were negative x2.  D-dimer normal.  Patient not having any reproducible chest pain with palpation of the chest wall.  Patient does have a remote history of chickenpox as a child.  This could be early shingles although I do not see any evidence of rash.  I will give her Toradol in addition to Percocet.  She will start ibuprofen regimen at home and I will also prescribe her Flexeril.  She will keep an eye on for developing rash.  I will have her follow-up with her primary care doctor for further evaluation.  Patient amenable to going home.  I think this is reasonable.  Strict return precaution discussed.  She is safe for discharge.           Hendricks Limes, PA-C 06/04/22 2054    Ottie Glazier, DO 06/05/22 0021

## 2022-06-04 NOTE — Discharge Instructions (Signed)
Please take 600 mg ibuprofen every 6 hours as needed for pain.  I will also write you Flexeril which is a muscle relaxer.  Please take as prescribed do not mix with alcohol.  I would like for you to follow-up with your primary care doctor for further evaluation.  Return to the emergency permit for any worsening symptoms you might have.

## 2022-06-04 NOTE — ED Triage Notes (Signed)
Pt presents with left-side chest pain, radiating to back x 2 days with SOB.

## 2022-06-07 ENCOUNTER — Telehealth: Payer: Self-pay

## 2022-06-07 NOTE — Telephone Encounter (Signed)
Transition Care Management Follow-up Telephone Call Date of discharge and from where: 06/04/2022 from [facility] How have you been since you were released from the hospital? Patient stated that she is feeling better and she did not have any questions or concerns at this time.  Any questions or concerns? No  Items Reviewed: Did the pt receive and understand the discharge instructions provided? Yes  Medications obtained and verified? Yes  Other? No  Any new allergies since your discharge? No  Dietary orders reviewed? No Do you have support at home? Yes   Functional Questionnaire: (I = Independent and D = Dependent) ADLs: I  Bathing/Dressing- I  Meal Prep- I  Eating- I  Maintaining continence- I  Transferring/Ambulation- I  Managing Meds- I   Follow up appointments reviewed:  PCP Hospital f/u appt confirmed? No  Patient encourage to call PCP to schedule follow up appt.  [facility] f/u appt confirmed? Yes  Scheduled to see GI on 06/21/2022 @ 3:00pm. Are transportation arrangements needed? No  If their condition worsens, is the pt aware to call PCP or go to the Emergency Dept.? Yes Was the patient provided with contact information for the PCP's office or ED? Yes Was to pt encouraged to call back with questions or concerns? Yes

## 2022-06-16 ENCOUNTER — Telehealth: Payer: Self-pay

## 2022-06-16 ENCOUNTER — Other Ambulatory Visit: Payer: Self-pay | Admitting: *Deleted

## 2022-06-16 DIAGNOSIS — D869 Sarcoidosis, unspecified: Secondary | ICD-10-CM

## 2022-06-16 NOTE — Telephone Encounter (Signed)
Patient called inquiring about DEXA scan. Not seeing anything in her chart about it. She also was concerned about getting her information over to [facility]. Told patient that I would speak with Dr. Benjamine Mola about the DEXA and that [facility] can see her records but if not we would be happy to fax anything over.   Advise about DEXA?

## 2022-06-17 ENCOUNTER — Ambulatory Visit (HOSPITAL_COMMUNITY): Payer: Medicaid Other | Admitting: Physical Therapy

## 2022-06-20 NOTE — Progress Notes (Deleted)
   [place] Banding Procedure Note:   Courtney Hale is a 56 y.o. female presenting today for consideration of hemorrhoid banding. Last colonoscopy 11/16/21 - non bleeding internal hemorrhoids and fair preparation of colon.   Interval history: ***  The patient presents with symptomatic grade 2 hemorrhoids, unresponsive to maximal medical therapy, requesting rubber band ligation of his/her hemorrhoidal disease. All risks, benefits, and alternative forms of therapy were described and informed consent was obtained.  In the left lateral decubitus position (if anoscopy is performed) anoscopic examination revealed grade *** hemorrhoids in the *** position (s).  The decision was made to band the left lateral*** internal hemorrhoid, and the [place] was used to perform band ligation without complication. Digital anorectal examination was then performed to assure proper positioning of the band, and to adjust the banded tissue as required. The patient was discharged home without pain or other issues. Dietary and behavioral recommendations were given and (if necessary prescriptions were given), along with follow-up instructions. The patient will return in *** for followup and possible additional banding as required.  No complications were encountered and the patient tolerated the procedure well.    Venetia Night, MSN, FNP-BC, AGACNP-BC [facility]

## 2022-06-21 ENCOUNTER — Encounter: Payer: Medicaid Other | Admitting: Gastroenterology

## 2022-06-21 NOTE — Telephone Encounter (Signed)
We discussed nuclear medicine bone scan for assessment of her sarcoidosis activity in bones, not a DEXA scan. I was not sure if she definitely wanted to pursue that or not form our last conversation. Will place future order for this and preferred site [facility] but I am not sure if we will need to change that site based on availability.  Also copying sharon since she has some experience getting these approved and set up.

## 2022-06-21 NOTE — Addendum Note (Signed)
Addended by: Collier Salina on: 06/21/2022 05:52 PM   Modules accepted: Orders

## 2022-06-22 ENCOUNTER — Other Ambulatory Visit: Payer: Self-pay | Admitting: *Deleted

## 2022-06-22 NOTE — Addendum Note (Signed)
Addended by: Bertram Savin on: 06/22/2022 11:35 AM   Modules accepted: Orders

## 2022-06-22 NOTE — Telephone Encounter (Signed)
3 phase or whole body?

## 2022-06-22 NOTE — Telephone Encounter (Signed)
We discussed nuclear medicine bone scan for assessment of her sarcoidosis activity in bones, not a DEXA scan. I was not sure if she definitely wanted to pursue that or not form our last conversation. Will place future order for this and preferred site [facility] but I am not sure if we will need to change that site based on availability.

## 2022-06-23 ENCOUNTER — Other Ambulatory Visit: Payer: Self-pay | Admitting: Family Medicine

## 2022-06-23 DIAGNOSIS — D8689 Sarcoidosis of other sites: Secondary | ICD-10-CM | POA: Diagnosis not present

## 2022-06-23 DIAGNOSIS — G4700 Insomnia, unspecified: Secondary | ICD-10-CM

## 2022-06-23 DIAGNOSIS — D860 Sarcoidosis of lung: Secondary | ICD-10-CM | POA: Diagnosis not present

## 2022-06-23 NOTE — Telephone Encounter (Signed)
Whole body scan. Thanks!

## 2022-06-23 NOTE — Addendum Note (Signed)
Addended by: Collier Salina on: 06/23/2022 02:45 PM   Modules accepted: Orders

## 2022-06-23 NOTE — Addendum Note (Signed)
Addended by: Bertram Savin on: 06/23/2022 02:21 PM   Modules accepted: Orders

## 2022-06-24 ENCOUNTER — Other Ambulatory Visit: Payer: Self-pay | Admitting: Family Medicine

## 2022-06-24 DIAGNOSIS — F32A Depression, unspecified: Secondary | ICD-10-CM

## 2022-06-24 DIAGNOSIS — F419 Anxiety disorder, unspecified: Secondary | ICD-10-CM

## 2022-06-29 ENCOUNTER — Encounter: Payer: Self-pay | Admitting: Nutrition

## 2022-07-01 ENCOUNTER — Ambulatory Visit: Payer: Medicaid Other | Admitting: Nurse Practitioner

## 2022-07-05 ENCOUNTER — Ambulatory Visit: Payer: Medicaid Other | Admitting: Nutrition

## 2022-07-08 ENCOUNTER — Ambulatory Visit: Payer: Medicaid Other | Admitting: Nurse Practitioner

## 2022-07-08 ENCOUNTER — Ambulatory Visit: Payer: Medicaid Other | Admitting: Orthopedic Surgery

## 2022-07-08 ENCOUNTER — Encounter (HOSPITAL_COMMUNITY): Payer: Self-pay

## 2022-07-08 ENCOUNTER — Encounter (HOSPITAL_COMMUNITY)
Admission: RE | Admit: 2022-07-08 | Discharge: 2022-07-08 | Disposition: A | Discharge status: Home / Self Care | Payer: Medicaid Other | Source: Ambulatory Visit | Attending: Internal Medicine | Admitting: Internal Medicine

## 2022-07-08 DIAGNOSIS — M76829 Posterior tibial tendinitis, unspecified leg: Secondary | ICD-10-CM | POA: Diagnosis not present

## 2022-07-08 DIAGNOSIS — D869 Sarcoidosis, unspecified: Secondary | ICD-10-CM | POA: Insufficient documentation

## 2022-07-08 DIAGNOSIS — M25552 Pain in left hip: Secondary | ICD-10-CM | POA: Insufficient documentation

## 2022-07-08 DIAGNOSIS — Z85820 Personal history of malignant melanoma of skin: Secondary | ICD-10-CM | POA: Diagnosis not present

## 2022-07-08 DIAGNOSIS — M170 Bilateral primary osteoarthritis of knee: Secondary | ICD-10-CM | POA: Diagnosis not present

## 2022-07-08 MED ORDER — TECHNETIUM TC 99M MEDRONATE IV KIT
20.0000 | PACK | Freq: Once | INTRAVENOUS | Status: AC | PRN
  Administered 2022-07-08: 20.3 via INTRAVENOUS

## 2022-07-08 NOTE — Progress Notes (Signed)
Chief Complaint  Patient presents with   Follow-up    Recheck on left ankle   56 years old recently diagnosed with sarcoidosis presents for follow-up on posterior tibial tendon dysfunction  Patient is still having some discomfort over the medial side of the ankle and distal tibia  However she could perform the single-leg heel raise well  Recommend home exercises, do not think surgery is necessary at this point  FU PRN

## 2022-07-08 NOTE — Patient Instructions (Signed)
Ankle Exercises Ask your health care provider which exercises are safe for you. Do exercises exactly as told by your health care provider and adjust them as directed. It is normal to feel mild stretching, pulling, tightness, or discomfort as you do these exercises. Stop right away if you feel sudden pain or your pain gets worse. Do not begin these exercises until told by your health care provider. Stretching and range-of-motion exercises These exercises warm up your muscles and joints. They can help improve the movement and flexibility of your ankle. They may also help to relieve pain. Dorsiflexion/plantar flexion  Sit with your left / right knee straight or bent. Do not rest your foot on anything. Flex your left / right ankle to tilt the top of your foot toward your shin. This is called dorsiflexion. Hold this position for _____5_____ seconds. Point your toes downward to tilt the top of your foot away from your shin. This is called plantar flexion. Hold this position for ______5____ seconds. Repeat ______30____ times. Complete this exercise 1 times a day. Ankle alphabet  Sit with your left / right foot supported at your lower leg. Do not rest your foot on anything. Make sure your foot has room to move freely. Think of your left / right foot as a paintbrush: Move your foot to trace each letter of the alphabet in the air. Keep your hip and knee still while you trace the letters. Make the letters as large as you can without causing or increasing any discomfort. Repeat ________2__ times. Complete this exercise __1________ times a day. Passive ankle dorsiflexion This is an exercise in which something or someone moves your ankle for you. Sit in a chair on a non-carpeted surface. Place your left / right foot on the floor, directly under your left / right knee. Extend your left / right leg for support. Keeping your heel down, slide your left / right foot back toward the chair until you feel a  stretch at your ankle or calf. If you do not feel a stretch, slide your buttocks forward to the edge of the chair while keeping your heel down. Hold this stretch for ______10____ seconds. Repeat _____10_____ times. Complete this exercise ____1______ times a day. Strengthening exercises These exercises build strength and endurance in your ankle. Endurance is the ability to use your muscles for a long time, even after they get tired. Dorsiflexors These are muscles that lift your foot up. Secure a rubber exercise band or tube to an object, such as a table leg, that will stay still when the band is pulled. Secure the other end around your left / right foot. Sit on the floor. Face the object with your left / right leg extended. The band or tube should be slightly tense when your foot is relaxed. Slowly flex your left / right ankle and toes to bring your foot toward your shin. Hold this position for ______3____ seconds. Slowly return your foot to the starting position, controlling the band as you do. Repeat __15_______ times. Complete this exercise _____1_____ times a day. Plantar flexors These are muscles that push your foot down. Sit on the floor with your left / right leg extended. Loop a rubber exercise band or tube around the ball of your left / right foot. The ball of your foot is on the walking surface, right under your toes. The band or tube should be slightly tense when your foot is relaxed. Slowly point your toes downward, pushing them away from you.  Hold this position for __3________ seconds. Slowly release the tension in the band or tube, controlling smoothly until your foot is back in the starting position. Repeat ______15____ times. Complete this exercise _1_________ times a day. Towel curls  Sit in a chair on a non-carpeted surface. Put your feet on the floor. Place a towel in front of your feet. If told by your health care provider, add a __________ lb / kg weight to the end of the  towel. Keeping your heel on the floor, put your left / right foot on the towel. Pull the towel toward you by grabbing the towel with your toes and curling them under. Keep your heel on the floor. Let your toes relax. Grab the towel again. Keep pulling the towel until it is completely underneath your foot. Repeat _____15_____ times. Complete this exercise _______1___ times a day. Standing plantar flexion This is an exercise in which you use your toes to lift your body's weight while standing. Stand with your feet shoulder-width apart. Keep your weight spread evenly over the width of your feet while you rise up on your toes. Use a wall or table to steady yourself if needed, but try not to use it for support. If this exercise is too easy, try these options: Shift your weight toward your left / right leg until you feel challenged. If told by your health care provider, lift your uninjured leg off the floor. Hold this position for ___3_______ seconds. Repeat ___15_______ times. Complete this exercise ___1_______ times a day. Tandem walking  Stand with one foot directly in front of the other. Slowly raise your back foot up, lifting your heel before your toes, and place it directly in front of your other foot. Continue to walk in this heel-to-toe way for _5 minutes________ or for as long as told by your health care provider. Have a countertop or wall nearby to use if needed to keep your balance, but try not to hold onto anything for support. Complete this exercise ___1_______ times a day. This information is not intended to replace advice given to you by your health care provider. Make sure you discuss any questions you have with your health care provider. Document Revised: 01/19/2022 Document Reviewed: 01/19/2022 Elsevier Patient Education  [place].

## 2022-07-12 ENCOUNTER — Telehealth: Payer: Self-pay

## 2022-07-12 NOTE — Telephone Encounter (Signed)
Encourage patient to contact the pharmacy for refills or they can request refills through [facility]  (Please schedule appointment if patient has not been seen in over a year)    [place] TO: [place], [place] - [address]  [address], [place] [address]   MEDICATION NAME & DOSE:HYDROcodone-acetaminophen (NORCO/VICODIN) 5-325 MG tablet   NOTES/COMMENTS FROM PATIENT:Per pharmacy pt has to called Dr Gabriel Carina each month to request pain meds now       [place] office please notify patient: It takes 48-72 hours to process rx refill requests Ask patient to call pharmacy to ensure rx is ready before heading there.

## 2022-07-12 NOTE — Telephone Encounter (Signed)
Patient scheduled follow up office visit 07/15/22 at 8:40am

## 2022-07-12 NOTE — Telephone Encounter (Signed)
Coral Spikes, DO    Needs visit.

## 2022-07-13 NOTE — Progress Notes (Unsigned)
Referring Provider: Coral Spikes, DO Primary Care Physician:  Coral Spikes, DO Primary GI Physician: Dr. Abbey Chatters  No chief complaint on file.   HPI:   Courtney Hale is a 56 y.o. 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   Prior EGD and colonoscopy 11/05/21: Colonoscopy: Nonbleeding internal hemorrhoids, otherwise normal exam.  Recommended 10-year screening colonoscopy.   EGD: Gastritis biopsied, otherwise normal exam.  Pathology with mild chronic gastritis, negative for H. pylori.  Last seen via virtual visit 04/14/2022.  Reported GERD had improved, but not adequately managed on omeprazole 40 mg twice daily with breakthrough symptoms twice weekly.  Also with new onset daily food and pill dysphagia with intermittent regurgitation.  Previously without dysphagia at the time of EGD.  Also with greater than 30-monthhistory of intermittent epigastric abdominal pain that can be triggered by meals, but not always and had new onset daily nausea for the last few months without vomiting, early satiety, and 6 pound weight loss.  Noted mild improvement in epigastric pain with increasing PPI to twice daily, but continued with breakthrough about twice a week.  Recently stopped naproxen, but reported symptoms were present prior to starting naproxen.  She had also been on methotrexate for sarcoidosis which may have been contributing to upper GI symptoms as well as refractory GERD, but unable to rule out biliary etiology, gastroparesis, and less likely pancreatitis.  She also reported new onset constipation, change from IBS diarrhea predominant, and was skipping 2 to 3 days between bowel movements.  Prior LLQ abdominal pain have improved, but still with some  tenderness every now and then.  Query whether constipation was contributing.  Also continued with hemorrhoid symptoms including rectal burning/itching/bleeding and was requesting hemorrhoid banding.  Recommended updating labs, repeating EGD with possible dilation, RUQ ultrasound, gastric emptying study, stop omeprazole and start pantoprazole twice daily, start MiraLAX daily, start Benefiber daily, Anusol rectal cream, and arrange hemorrhoid banding with CVenetia Night NP. Plan to follow-up in 2-3 months.   Labs I ordered were not completed, but she did have blood work completed with another provider on 6/26 with CBC normal, CMP with elevated glucose and hypokalemia, but otherwise normal.  RUQ ultrasound 6/26 with hepatic steatosis, gallbladder and CBD normal.  Gastric emptying study 6/27 with normal exam.  EGD 05/03/2022: Normal esophagus s/p empiric dilation, gastritis biopsied.  Pathology with mild chronic, focally active gastritis, negative for H. pylori.  She underwent banding of right posterior internal hemorrhoid column on 05/06/2022.  Recommend return in 3 weeks for follow-up and possible additional banding as required, but she canceled her appointment on 8/8 and no showed to her follow-up on 8/28.  Today:   GERD:   Dysphagia:   Epigastric pain, nausea, early satiety:  Change in bowel habits/constipation:    LLQ abdominal pain:   Hemorrhoids:     Past Medical History:  Diagnosis Date   [place]    Skin   Complication of anesthesia    woke up during endoscopy and colonoscopy   Depression    Di[place]    GERD (gastroesophageal reflux disease)    Hemorrhoids    History of esophageal stricture    2011  peptic w/  dilatation   History of gastritis    2011  History of melanoma excision    2015  left leg/   12-09-2015 back    Hypertension    Microhematuria    Migraines    Nephrolithiasis    left side non-obstructive    Right ureteral stone    Sarcoidosis  07/24/2021   Urgency of urination    Wears contact lenses     Past Surgical History:  Procedure Laterality Date   ABDOMINAL HYSTERECTOMY     BALLOON DILATION N/A 05/03/2022   Procedure: BALLOON DILATION;  Surgeon: Eloise Harman, DO;  [facility];  Service: Endoscopy;  Laterality: N/A;   BIOPSY  11/16/2021   Procedure: BIOPSY;  Surgeon: Eloise Harman, DO;  [facility];  Service: Endoscopy;;   BIOPSY  05/03/2022   Procedure: BIOPSY;  Surgeon: Eloise Harman, DO;  [facility];  Service: Endoscopy;;   CARPAL TUNNEL RELEASE Right 10/29/2013   COLONOSCOPY N/A 08/27/2015   Procedure: COLONOSCOPY;  Surgeon: Danie Binder, MD;  [facility]: [facility];  Service: Endoscopy;  Laterality: N/A;  0830   COLONOSCOPY WITH PROPOFOL N/A 11/16/2021   Surgeon: Eloise Harman, DO;  Nonbleeding internal hemorrhoids, otherwise normal exam.  Recommended 10-year screening colonoscopy.   CYST EXCISION  10/25/2010   right hand   CYSTOSCOPY W/ URETERAL STENT PLACEMENT Right 12/22/2015   Procedure: CYSTOSCOPY WITH RETROGRADE PYELOGRAM/URETERAL STENT PLACEMENT;  Surgeon: Nickie Retort, MD;  Location: [facility];  Service: Urology;  Laterality: Right;   CYSTOSCOPY WITH RETROGRADE PYELOGRAM, URETEROSCOPY AND STENT PLACEMENT Right 01/05/2016   Procedure: CYSTOSCOPY WITH RETROGRADE PYELOGRAM, URETEROSCOPY AND STENT REPLACEMENT;  Surgeon: Nickie Retort, MD;  Location: [facility];  Service: Urology;  Laterality: Right;   DILATION AND CURETTAGE OF UTERUS  03/12/2009   w/  Suction   double balloon enteroscopy     [facility]. Arsenio Loader at [facility]: no erosions, no evidence of Crohn's disease, path without Crohn's.    EGD with push enteroscopy  02/12/2010    patent distal peptic stricture with diffuse antral erythema, normal D1 and D2    ESOPHAGOGASTRODUODENOSCOPY (EGD) WITH PROPOFOL N/A 11/16/2021   Surgeon: Eloise Harman, DO;   Gastritis biopsied, otherwise  normal exam.  Pathology with mild chronic gastritis, negative for H. pylori.   ESOPHAGOGASTRODUODENOSCOPY (EGD) WITH PROPOFOL N/A 05/03/2022   Procedure: ESOPHAGOGASTRODUODENOSCOPY (EGD) WITH PROPOFOL;  Surgeon: Eloise Harman, DO;  [facility];  Service: Endoscopy;  Laterality: N/A;  2:45pm   HAND SURGERY     LAPAROSCOPIC ASSISTED VAGINAL HYSTERECTOMY  10/26/2014   w/  Bilateral Salpingoophorectomy   STONE EXTRACTION WITH BASKET Right 01/05/2016   Procedure: STONE EXTRACTION WITH BASKET;  Surgeon: Nickie Retort, MD;  Location[facility];  Service: Urology;  Laterality: Right;   TONSILLECTOMY  1998  approx    Current Outpatient Medications  Medication Sig Dispense Refill   albuterol (VENTOLIN HFA) 108 (90 Base) MCG/ACT inhaler Inhale 2 puffs into the lungs every 6 (six) hours as needed for wheezing or shortness of breath. 8 g 6   BD PEN NEEDLE NANO 2ND GEN 32G X 4 MM MISC USE AS DIRECTED 100 each 0   busPIRone (BUSPAR) 5 MG tablet TAKE 1 TABLET BY MOUTH THREE TIMES DAILY (Patient taking differently: Take 5 mg by mouth 3 (three) times daily as needed (anxiety).) 45 tablet 2   calcium carbonate (TUMS - DOSED IN MG ELEMENTAL CALCIUM) 500 MG chewable tablet Chew 1,500 mg by mouth  2 (two) times daily as needed for indigestion or heartburn.     Carboxymethylcellul-Glycerin (LUBRICATING EYE DROPS OP) Place 1 drop into both eyes daily as needed (dry eyes).     Chromium 1000 MCG TABS Take 1,000 mcg by mouth daily.     CINNAMON PO Take 2,000 mg by mouth daily.     Continuous Blood Gluc Receiver (FREESTYLE LIBRE 2 READER) DEVI Use as directed to check blood sugars. 1 each 0   Continuous Blood Gluc Sensor (FREESTYLE LIBRE 2 SENSOR) MISC Use sensor as directed. Change every 10-14 days. 4 each 3   Cyanocobalamin (B-12) 2000 MCG TABS Take 2,000 mcg by mouth daily.     cyclobenzaprine (FLEXERIL) 10 MG tablet Take 1 tablet (10 mg total) by mouth 2 (two) times daily as needed  for muscle spasms. 20 tablet 0   EPINEPHrine 0.3 mg/0.3 mL IJ SOAJ injection Inject 0.3 mg into the muscle as needed for anaphylaxis. 1 each 0   folic acid (FOLVITE) 1 MG tablet Take 2 tablets (2 mg total) by mouth daily. Take 1 tablet daily for 14 days, then increase to 2 tablets daily thereafter. (Patient taking differently: Take 2 mg by mouth daily.) 90 tablet 2   hydrochlorothiazide (HYDRODIURIL) 25 MG tablet Take 1 tablet (25 mg total) by mouth daily. 90 tablet 1   HYDROcodone-acetaminophen (NORCO/VICODIN) 5-325 MG tablet Take 1 tablet by mouth every 6 (six) hours as needed for severe pain. To be used sparingly for acute, severe pain. 20 tablet 0   insulin aspart (NOVOLOG FLEXPEN) 100 UNIT/ML FlexPen Inject 5-11 Units into the skin 3 (three) times daily with meals. (Patient taking differently: Inject 5-11 Units into the skin 3 (three) times daily as needed for high blood sugar.) 30 mL 1   losartan (COZAAR) 100 MG tablet Take 1 tablet by mouth once daily 90 tablet 0   meclizine (ANTIVERT) 25 MG tablet Take 25 mg by mouth 3 (three) times daily as needed for dizziness.     metFORMIN (GLUCOPHAGE) 850 MG tablet TAKE 1 TABLET BY MOUTH TWICE DAILY WITH A MEAL 180 tablet 0   methotrexate (RHEUMATREX) 2.5 MG tablet Take 8 tablets (20 mg total) by mouth once a week. (Patient taking differently: Take 20 mg by mouth every Monday.) 32 tablet 4   methotrexate (RHEUMATREX) 2.5 MG tablet Take 8 tablets (20 mg total) by mouth once a week for 28 days. Caution:Chemotherapy. Protect from light. 16 tablet 1   naproxen (NAPROSYN) 500 MG tablet Take 1 tablet (500 mg total) by mouth 2 (two) times daily. (Patient not taking: Reported on 05/31/2022) 60 tablet 1   ondansetron (ZOFRAN-ODT) 4 MG disintegrating tablet Take 4 mg by mouth every 8 (eight) hours as needed for nausea or vomiting.     pantoprazole (PROTONIX) 40 MG tablet Take 1 tablet (40 mg total) by mouth 2 (two) times daily before a meal. 60 tablet 3   PARoxetine  (PAXIL) 40 MG tablet TAKE 1 TABLET BY MOUTH ONCE DAILY IN THE MORNING 90 tablet 0   Probiotic Product (PROBIOTIC PO) Take 1 capsule by mouth daily.     prochlorperazine (COMPAZINE) 10 MG tablet Take 1 tablet (10 mg total) by mouth every 6 (six) hours as needed for nausea or vomiting. 30 tablet 2   rizatriptan (MAXALT-MLT) 10 MG disintegrating tablet Take 1 tablet (10 mg total) by mouth as needed for migraine. May repeat in 2 hours if needed. Max 2 per 24 hours 10 tablet 1   SEMGLEE,  YFGN, 100 UNIT/ML Pen INJECT 35 UNITS SUBCUTANEOUSLY EVERY NIGHT (Patient taking differently: Inject 30 Units into the skin at bedtime. INJECT 30 UNITS SUBCUTANEOUSLY EVERY NIGHT) 15 mL 0   traZODone (DESYREL) 100 MG tablet TAKE 1 TABLET BY MOUTH AT BEDTIME AS NEEDED FOR SLEEP 90 tablet 0   valACYclovir (VALTREX) 500 MG tablet Take 500 mg by mouth 2 (two) times daily as needed (outbreaks).     No current facility-administered medications for this visit.    Allergies as of 07/15/2022 - Review Complete 07/08/2022  Allergen Reaction Noted   Topamax [topiramate] Other (See Comments) 12/18/2016   Fluorouracil Swelling 11/05/2021   Sulfa antibiotics Other (See Comments) 12/05/2012    Family History  Problem Relation Age of Onset   Hypertension Mother    Thyroid disease Mother    Diabetes Mother    Hyperlipidemia Mother    Stroke Mother    Heart disease Mother    Hyperlipidemia Father    Hypertension Father    Heart attack Father    Heart failure Father    Colon cancer Neg Hx     Social History   Socioeconomic History   Marital status: Single    Spouse name: Not on file   Number of children: Not on file   Years of education: Not on file   Highest education level: Not on file  Occupational History   Not on file  Tobacco Use   Smoking status: Never    Passive exposure: Past   Smokeless tobacco: Never  Vaping Use   Vaping Use: Never used  Substance and Sexual Activity   Alcohol use: Not Currently    Drug use: No   Sexual activity: Not on file  Other Topics Concern   Not on file  Social History Narrative   Not on file   Social Determinants of Health   Financial Resource Strain: Low Risk  (06/30/2021)   Overall Financial Resource Strain (CARDIA)    Difficulty of Paying Living Expenses: Not hard at all  Food Insecurity: No Food Insecurity (06/30/2021)   Hunger Vital Sign    Worried About Running Out of Food in the Last Year: Never true    [place] in the Last Year: Never true  Transportation Needs: No Transportation Needs (06/30/2021)   PRAPARE - Hydrologist (Medical): No    Lack of Transportation (Non-Medical): No  Physical Activity: Inactive (06/30/2021)   Exercise Vital Sign    Days of Exercise per Week: 0 days    Minutes of Exercise per Session: 0 min  Stress: Stress Concern Present (06/30/2021)   [place]    Feeling of Stress : Very much  Social Connections: Socially Isolated (06/30/2021)   Social Connection and Isolation Panel [NHANES]    Frequency of Communication with Friends and Family: More than three times a week    Frequency of Social Gatherings with Friends and Family: More than three times a week    Attends Religious Services: Never    Marine scientist or Organizations: No    Attends Music therapist: Never    Marital Status: Never married    Review of Systems: Gen: Denies fever, chills, cold or flu like symptoms, pre-syncope, or syncope.  CV: Denies chest pain, palpitations. Resp: Denies dyspnea, cough.  GI: See HPI Heme: See HPI  Physical Exam: LMP 10/26/2014  General:   Alert and oriented. No distress  noted. Pleasant and cooperative.  Head:  Normocephalic and atraumatic. Eyes:  Conjuctiva clear without scleral icterus. Heart:  S1, S2 present without murmurs appreciated. Lungs:  Clear to auscultation bilaterally. No wheezes, rales, or  rhonchi. No distress.  Abdomen:  +BS, soft, non-tender and non-distended. No rebound or guarding. No HSM or masses noted. Msk:  Symmetrical without gross deformities. Normal posture. Extremities:  Without edema. Neurologic:  Alert and  oriented x4 Psych:  Normal mood and affect.    Assessment:     Plan:  ***   [name], PA-C [facility] 07/15/2022

## 2022-07-14 ENCOUNTER — Encounter: Payer: Medicaid Other | Attending: Family Medicine | Admitting: Nutrition

## 2022-07-14 DIAGNOSIS — D869 Sarcoidosis, unspecified: Secondary | ICD-10-CM | POA: Diagnosis not present

## 2022-07-14 DIAGNOSIS — K582 Mixed irritable bowel syndrome: Secondary | ICD-10-CM | POA: Diagnosis not present

## 2022-07-14 DIAGNOSIS — E1165 Type 2 diabetes mellitus with hyperglycemia: Secondary | ICD-10-CM | POA: Diagnosis not present

## 2022-07-14 NOTE — Patient Instructions (Signed)
GOals  Try a diabetic friendly supplement for meals when not eating Try ginger on foods for nausea Try some herbal teas with ginger Increase increase plant based protein-beans, peas and lentals Try Health Choice Power Bowl meals when not feeling like cooking Drink 4-5 bottle so water per day.

## 2022-07-14 NOTE — Progress Notes (Signed)
Medical Nutrition Therapy  Appointment Start time:  82  Appointment End time:  1200 Primary concerns today: DM Type 2  Referral diagnosis: E11.8 Preferred learning style. No preference  Learning readiness: Contemplating   NUTRITION ASSESSMENT Follow up DM Has been increasing more activity- more walking. Takes Chemo on Monday and takes a few days to recover and get strength back. Changes: limted due to chronic nausea. Trying to eat better quality of foods. Taking compazine for nausea. Has been on chemo for the last year. Taking Semglee 35 units at night and Metformin.  Lost 4 lbs. Has hyperlipidemia. Not on any medications for this according to medication list. A1C down from 7.9% to 6.4%   Diet is restricted due to nausea from chemo. Diet is insuffient in fruits, vegetables and whole grains.  She is willing to improve her foods choices as tolerated due to her chemo and medications. Lab Results  Component Value Date   HGBA1C 6.4 02/25/2022    Anthropometrics  Wt Readings from Last 3 Encounters:  06/04/22 179 lb 8 oz (81.4 kg)  05/31/22 183 lb 9.6 oz (83.3 kg)  05/06/22 185 lb (83.9 kg)   Ht Readings from Last 3 Encounters:  06/04/22 5' 3.5" (1.613 m)  05/31/22 5' 3.5" (1.613 m)  05/06/22 '5\' 3"'$  (1.6 m)   There is no height or weight on file to calculate BMI. '@BMIFA'$ @ Facility age limit for growth %iles is 20 years. Facility age limit for growth %iles is 20 years.    Clinical Medical Hx: See chart Medications: Semglee 30 units at night, Novolog with meals 10 units with sliding scale, Metformin 850 mg BID. Labs:  Wt Readings from Last 3 Encounters:  06/04/22 179 lb 8 oz (81.4 kg)  05/31/22 183 lb 9.6 oz (83.3 kg)  05/06/22 185 lb (83.9 kg)   Ht Readings from Last 3 Encounters:  06/04/22 5' 3.5" (1.613 m)  05/31/22 5' 3.5" (1.613 m)  05/06/22 '5\' 3"'$  (1.6 m)   There is no height or weight on file to calculate BMI. '@BMIFA'$ @ Facility age limit for growth %iles is 20  years. Facility age limit for growth %iles is 20 years.  Lab Results  Component Value Date   HGBA1C 6.4 02/25/2022      Latest Ref Rng & Units 06/04/2022    5:46 PM 04/19/2022   12:04 PM 03/23/2022    2:44 PM  CMP  Glucose 70 - 99 mg/dL 100  151    BUN 6 - 20 mg/dL 7  13    Creatinine 0.44 - 1.00 mg/dL 0.70  0.63    Sodium 135 - 145 mmol/L 138  139    Potassium 3.5 - 5.1 mmol/L 3.2  3.0    Chloride 98 - 111 mmol/L 105  100    CO2 22 - 32 mmol/L 24  27    Calcium 8.9 - 10.3 mg/dL 8.9  9.5    Total Protein 6.0 - 8.3 g/dL  6.5  5.9   Total Bilirubin 0.2 - 1.2 mg/dL  0.5  0.4   Alkaline Phos 39 - 117 U/L  59    AST 0 - 37 U/L  26  29   ALT 0 - 35 U/L  32  29    Lipid Panel     Component Value Date/Time   CHOL 258 (H) 09/25/2021 1134   TRIG 169 (H) 09/25/2021 1134   HDL 59 09/25/2021 1134   CHOLHDL 4.4 09/25/2021 1134   LDLCALC 168 (H) 09/25/2021  1134   LABVLDL 31 09/25/2021 1134    Notable Signs/Symptoms: Increased thirst, frequent urination, fatigue  Lifestyle & Dietary Hx Has sarcoidosis and getting treatment for that. Lives with her husand.  Estimated daily fluid intake: 16 oz, 2 coke zeros Supplements:  Sleep: 3-4 hours. Stress / self-care: ilnness Current average weekly physical activity: ADL  24-Hr Dietary Recall First Meal:JImmy dean breakfast bowl Skips Chicken sandwich from [employer], Diet coke Dinner: 6pm skipped.   Estimated Energy Needs Calories: 1500 Carbohydrate: 170g Protein: 1122g Fat: 42g   NUTRITION DIAGNOSIS  NB-1.1 Food and nutrition-related knowledge deficit As related to Diabetes Type 2.  As evidenced by A1C 7.7%.   NUTRITION INTERVENTION  Nutrition education (E-1) on the following topics:  Nutrition and Diabetes education provided on My Plate, CHO counting, meal planning, portion sizes, timing of meals, avoiding snacks between meals unless having a low blood sugar, target ranges for A1C and blood sugars, signs/symptoms and treatment  of hyper/hypoglycemia, monitoring blood sugars, taking medications as prescribed, benefits of exercising 30 minutes per day and prevention of complications of DM.  Lifestyle Medicine - Whole Food, Plant Predominant Nutrition is highly recommended: Eat Plenty of vegetables, Mushrooms, fruits, Legumes, Whole Grains, Nuts, seeds in lieu of processed meats, processed snacks/pastries red meat, poultry, eggs.    -It is better to avoid simple carbohydrates including: Cakes, Sweet Desserts, Ice Cream, Soda (diet and regular), Sweet Tea, Candies, Chips, Cookies, Store Bought Juices, Alcohol in Excess of  1-2 drinks a day, Lemonade,  Artificial Sweeteners, Doughnuts, Coffee Creamers, "Sugar-free" Products, etc, etc.  This is not a complete list.....  Exercise: If you are able: 30 -60 minutes a day ,4 days a week, or 150 minutes a week.  The longer the better.  Combine stretch, strength, and aerobic activities.  If you were told in the past that you have high risk for cardiovascular diseases, you may seek evaluation by your heart doctor prior to initiating moderate to intense exercise programs.  Handouts Provided Include  Livestyle medicine meal plan Lifestyle nutrition Meal Plan Card  Learning Style & Readiness for Change Teaching method utilized: Visual & Auditory  Demonstrated degree of understanding via: Teach Back  Barriers to learning/adherence to lifestyle change: chemo treatments  Goals Established by Pt Eat three meals-plant based foods per day Eat meals on time Drink only water Avoid snacks Don't eat after 7 pm. Get A1C down to 7% or less Avoid hypoglcyemia.   MONITORING & EVALUATION Dietary intake, weekly physical activity, and blood sugars and weight in 1 month.  Next Steps  Patient is to work on meal planning, portion control and exercise.Marland Kitchen

## 2022-07-15 ENCOUNTER — Ambulatory Visit (INDEPENDENT_AMBULATORY_CARE_PROVIDER_SITE_OTHER): Payer: Medicaid Other | Admitting: Gastroenterology

## 2022-07-15 ENCOUNTER — Ambulatory Visit: Payer: Medicaid Other | Admitting: Family Medicine

## 2022-07-15 ENCOUNTER — Telehealth: Payer: Self-pay | Admitting: *Deleted

## 2022-07-15 ENCOUNTER — Encounter: Payer: Self-pay | Admitting: Gastroenterology

## 2022-07-15 ENCOUNTER — Encounter: Payer: Self-pay | Admitting: Family Medicine

## 2022-07-15 VITALS — BP 111/72 | HR 95 | Temp 97.8°F | Ht 63.0 in | Wt 174.6 lb

## 2022-07-15 DIAGNOSIS — R197 Diarrhea, unspecified: Secondary | ICD-10-CM

## 2022-07-15 DIAGNOSIS — R634 Abnormal weight loss: Secondary | ICD-10-CM | POA: Insufficient documentation

## 2022-07-15 DIAGNOSIS — D869 Sarcoidosis, unspecified: Secondary | ICD-10-CM

## 2022-07-15 DIAGNOSIS — R300 Dysuria: Secondary | ICD-10-CM | POA: Diagnosis not present

## 2022-07-15 DIAGNOSIS — G4700 Insomnia, unspecified: Secondary | ICD-10-CM

## 2022-07-15 DIAGNOSIS — E119 Type 2 diabetes mellitus without complications: Secondary | ICD-10-CM | POA: Diagnosis not present

## 2022-07-15 DIAGNOSIS — R52 Pain, unspecified: Secondary | ICD-10-CM | POA: Diagnosis not present

## 2022-07-15 DIAGNOSIS — R1010 Upper abdominal pain, unspecified: Secondary | ICD-10-CM

## 2022-07-15 DIAGNOSIS — Z794 Long term (current) use of insulin: Secondary | ICD-10-CM | POA: Diagnosis not present

## 2022-07-15 DIAGNOSIS — K219 Gastro-esophageal reflux disease without esophagitis: Secondary | ICD-10-CM | POA: Diagnosis not present

## 2022-07-15 DIAGNOSIS — R1032 Left lower quadrant pain: Secondary | ICD-10-CM

## 2022-07-15 DIAGNOSIS — I10 Essential (primary) hypertension: Secondary | ICD-10-CM | POA: Diagnosis not present

## 2022-07-15 DIAGNOSIS — R112 Nausea with vomiting, unspecified: Secondary | ICD-10-CM | POA: Diagnosis not present

## 2022-07-15 MED ORDER — LANSOPRAZOLE 30 MG PO CPDR: 30.0000 mg | DELAYED_RELEASE_CAPSULE | Freq: Two times a day (BID) | ORAL | 3 refills | Status: DC

## 2022-07-15 MED ORDER — TRAZODONE HCL 150 MG PO TABS: 150.0000 mg | ORAL_TABLET | Freq: Every evening | ORAL | 3 refills | Status: DC | PRN

## 2022-07-15 MED ORDER — HYDROCODONE-ACETAMINOPHEN 5-325 MG PO TABS: 1.0000 | ORAL_TABLET | Freq: Two times a day (BID) | ORAL | 0 refills | Status: DC

## 2022-07-15 MED ORDER — DICYCLOMINE HCL 10 MG PO CAPS: ORAL_CAPSULE | ORAL | 0 refills | Status: DC

## 2022-07-15 MED ORDER — PROCHLORPERAZINE MALEATE 10 MG PO TABS: 10.0000 mg | ORAL_TABLET | Freq: Four times a day (QID) | ORAL | 2 refills | Status: DC | PRN

## 2022-07-15 MED ORDER — SCOPOLAMINE 1 MG/3DAYS TD PT72: 1.0000 | MEDICATED_PATCH | TRANSDERMAL | 12 refills | Status: DC

## 2022-07-15 MED ORDER — SUCRALFATE 1 G PO TABS: 1.0000 g | ORAL_TABLET | Freq: Three times a day (TID) | ORAL | 1 refills | Status: DC

## 2022-07-15 NOTE — Assessment & Plan Note (Signed)
A1c at goal.  Continue current dosing of insulin and current dosing of metformin.

## 2022-07-15 NOTE — Assessment & Plan Note (Signed)
Now being followed at [facility].  Has had a recent bone scan with no new involvement. She states that she was not approved for Humira.  They are going to proceed with infusion treatment.

## 2022-07-15 NOTE — Progress Notes (Signed)
Subjective:  Patient ID: Courtney Hale, female    DOB: Jun 01, 1966  Age: 56 y.o. MRN: [id number]  CC: Chief Complaint  Patient presents with   Pain Management    Pt arrives to follow up on pain management. Pt taking Hydrocodone 5-325; sometimes 2 per day.  Would like script for patches for upcoming cruise.     HPI:  56 year old female with type 2 diabetes, sarcoidosis with bone involvement, IBS, hypertension, anxiety and depression presents for follow-up.  Patient has had de-escalation in her diabetic medication as she is now off of corticosteroids for sarcoidosis.  Patient is now on just metformin and Semglee 30 units nightly.  Most recent A1c 6.4.  Hypertension is well controlled on hydrochlorothiazide and losartan.  Doing well at this time.  Patient is experiencing significant pain associated with sarcoidosis as well as osteoarthritis.  Patient has been using pain medication as needed.  She is now needing this on a regular basis.  Needs to sign a pain contract today.  Patient states that the pain medication helps her pain significantly takes it from a 9/10 in severity to 3-4 out of 10.  Patient states that she is going on a trip.  She would like scopolamine patches for seasickness.  Additionally, patient reports continued ongoing issues with insomnia.  She would like to increase her dose of trazodone.  Patient Active Problem List   Diagnosis Date Noted   Type 2 diabetes mellitus without complications ([place]) 79/11/4095   Insomnia 07/15/2022   Encounter for pain management 03/03/2022   Generalized pain 02/24/2022   High risk medication use 01/06/2022   Immunosuppression ([place]) 11/16/2021   Sarcoidosis 08/14/2021   Gastroesophageal reflux disease without esophagitis 07/27/2017   Anxiety and depression 12/18/2016   IBS (irritable bowel syndrome) 03/18/2016   Melanoma of skin ([place]) 03/18/2016   Headache, chronic migraine without aura 01/31/2013   Hypertension 01/31/2013     Social Hx   Social History   Socioeconomic History   Marital status: Single    Spouse name: Not on file   Number of children: Not on file   Years of education: Not on file   Highest education level: Not on file  Occupational History   Not on file  Tobacco Use   Smoking status: Never    Passive exposure: Past   Smokeless tobacco: Never  Vaping Use   Vaping Use: Never used  Substance and Sexual Activity   Alcohol use: Not Currently   Drug use: No   Sexual activity: Not on file  Other Topics Concern   Not on file  Social History Narrative   Not on file   Social Determinants of Health   Financial Resource Strain: Low Risk  (06/30/2021)   Overall Financial Resource Strain (CARDIA)    Difficulty of Paying Living Expenses: Not hard at all  Food Insecurity: No Food Insecurity (06/30/2021)   Hunger Vital Sign    Worried About Running Out of Food in the Last Year: Never true    [place] in the Last Year: Never true  Transportation Needs: No Transportation Needs (06/30/2021)   PRAPARE - Hydrologist (Medical): No    Lack of Transportation (Non-Medical): No  Physical Activity: Inactive (06/30/2021)   Exercise Vital Sign    Days of Exercise per Week: 0 days    Minutes of Exercise per Session: 0 min  Stress: Stress Concern Present (06/30/2021)   [place] -  Occupational Stress Questionnaire    Feeling of Stress : Very much  Social Connections: Socially Isolated (06/30/2021)   Social Connection and Isolation Panel [NHANES]    Frequency of Communication with Friends and Family: More than three times a week    Frequency of Social Gatherings with Friends and Family: More than three times a week    Attends Religious Services: Never    Marine scientist or Organizations: No    Attends Archivist Meetings: Never    Marital Status: Never married    Review of Systems  Constitutional: Negative.    Musculoskeletal:  Positive for arthralgias.   Objective:  BP 122/84   Pulse 70   Temp 98.3 F (36.8 C)   Wt 176 lb 9.6 oz (80.1 kg)   LMP 10/26/2014   SpO2 97%   BMI 30.79 kg/m      07/15/2022    1:55 PM 07/15/2022    8:39 AM 06/04/2022    9:05 PM  BP/Weight  Systolic BP 093 818 299  Diastolic BP 72 84 95  Wt. (Lbs) 174.6 176.6   BMI 30.93 kg/m2 30.79 kg/m2     Physical Exam Vitals and nursing note reviewed.  Constitutional:      General: She is not in acute distress.    Appearance: Normal appearance.  HENT:     Head: Normocephalic and atraumatic.  Eyes:     General:        Right eye: No discharge.        Left eye: No discharge.     Conjunctiva/sclera: Conjunctivae normal.  Cardiovascular:     Rate and Rhythm: Normal rate and regular rhythm.  Pulmonary:     Effort: Pulmonary effort is normal.     Breath sounds: Normal breath sounds. No wheezing, rhonchi or rales.  Neurological:     Mental Status: She is alert.  Psychiatric:        Mood and Affect: Mood normal.        Behavior: Behavior normal.     Lab Results  Component Value Date   WBC 7.3 06/04/2022   HGB 12.5 06/04/2022   HCT 35.5 (L) 06/04/2022   PLT 308 06/04/2022   GLUCOSE 100 (H) 06/04/2022   CHOL 258 (H) 09/25/2021   TRIG 169 (H) 09/25/2021   HDL 59 09/25/2021   LDLCALC 168 (H) 09/25/2021   ALT 32 04/19/2022   AST 26 04/19/2022   NA 138 06/04/2022   K 3.2 (L) 06/04/2022   CL 105 06/04/2022   CREATININE 0.70 06/04/2022   BUN 7 06/04/2022   CO2 24 06/04/2022   TSH 0.996 08/27/2020   INR 0.9 07/21/2021   HGBA1C 6.4 02/25/2022     Assessment & Plan:   Problem List Items Addressed This Visit       Cardiovascular and Mediastinum   Hypertension    At goal.  Continue losartan and HCTZ.        Endocrine   RESOLVED: Type 2 diabetes mellitus without complications (HCC)   Type 2 diabetes mellitus without complications (HCC)    B7J at goal.  Continue current dosing of insulin and  current dosing of metformin.        Other   Encounter for pain management    Indication for chronic opioid: Has pain secondary to bone involvement of sarcoidosis. Medication and dose: Norco 5/325 mg 1 tablet twice daily # pills per month: 60.  Last UDS date: Planning for UDS in the future.  Opioid Treatment Agreement signed (Y/N): Yes.  Leslie reviewed this encounter (include red flags): Yes       Insomnia    Trazodone dosing increase.      Relevant Medications   traZODone (DESYREL) 150 MG tablet   Sarcoidosis    Now being followed at [facility].  Has had a recent bone scan with no new involvement. She states that she was not approved for Humira.  They are going to proceed with infusion treatment.       Meds ordered this encounter  Medications   DISCONTD: HYDROcodone-acetaminophen (NORCO/VICODIN) 5-325 MG tablet    Sig: Take 1 tablet by mouth 2 (two) times daily. For moderate to severe pain.    Dispense:  60 tablet    Refill:  0   DISCONTD: HYDROcodone-acetaminophen (NORCO/VICODIN) 5-325 MG tablet    Sig: Take 1 tablet by mouth 2 (two) times daily. For moderate to severe pain.    Dispense:  60 tablet    Refill:  0   HYDROcodone-acetaminophen (NORCO/VICODIN) 5-325 MG tablet    Sig: Take 1 tablet by mouth 2 (two) times daily. For moderate to severe pain.    Dispense:  60 tablet    Refill:  0   traZODone (DESYREL) 150 MG tablet    Sig: Take 1 tablet (150 mg total) by mouth at bedtime as needed for sleep.    Dispense:  90 tablet    Refill:  3   scopolamine (TRANSDERM-SCOP) 1 MG/3DAYS    Sig: Place 1 patch (1.5 mg total) onto the skin every 3 (three) days.    Dispense:  10 patch    Refill:  12    Follow-up:  Return in about 3 months (around 10/14/2022).  [place]

## 2022-07-15 NOTE — Assessment & Plan Note (Signed)
Trazodone dosing increase.

## 2022-07-15 NOTE — Patient Instructions (Addendum)
Pain medication as directed.  I increased the trazodone.  Follow-up in 3 months

## 2022-07-15 NOTE — Patient Instructions (Addendum)
Please have blood work completed at [facility].  We will arrange to have a CT of your abdomen and pelvis in the near future.  We will arrange to have a HIDA scan to evaluate your gallbladder function in the near future.  Stop pantoprazole and start Prevacid 30 mg twice daily minutes before breakfast and dinner.  Start Carafate 1 g up to 3 times daily before meals and at bedtime to see if this will help with the burning in your upper abdomen.  Take prochlorperazine 3 times daily before meals.  Try drinking 1-2 boost daily to help supplement your oral intake and maintain your weight.  Start dicyclomine 10 mg up to 3 times daily before meals and at bedtime to help with the diarrhea likely secondary to irritable bowel syndrome.  Recommend starting with once daily and increasing as needed.  Hold in the setting of constipation.  We will plan for you to follow-up with Dr. Abbey Chatters in about 4 weeks.   [name], PA-C [facility]

## 2022-07-15 NOTE — Telephone Encounter (Signed)
PA: Order ID: [id number]       Authorized  Approval Valid Through: 07/15/2022 - 09/12/2022

## 2022-07-15 NOTE — Assessment & Plan Note (Signed)
At goal.  Continue losartan and HCTZ. 

## 2022-07-15 NOTE — Assessment & Plan Note (Signed)
Indication for chronic opioid: Has pain secondary to bone involvement of sarcoidosis. Medication and dose: Norco 5/325 mg 1 tablet twice daily # pills per month: 60.  Last UDS date: Planning for UDS in the future. Opioid Treatment Agreement signed (Y/N): Yes.  NCCSRS reviewed this encounter (include red flags): Yes

## 2022-07-15 NOTE — Telephone Encounter (Signed)
Pt informed that CT was scheduled for 07/21/22 at 12:30 pm arrive at 12 pm, nothing to eat or drink 4 hours prior to procedure. She was informed to go by [facility] to pick up prep that she has to drink. Verbalized understanding.

## 2022-07-16 ENCOUNTER — Encounter: Payer: Self-pay | Admitting: *Deleted

## 2022-07-16 LAB — CBC WITH DIFFERENTIAL/PLATELET
Absolute Monocytes: 490 cells/uL (ref 200–950)
Basophils Absolute: 28 cells/uL (ref 0–200)
Basophils Relative: 0.4 %
Eosinophils Absolute: 147 cells/uL (ref 15–500)
Eosinophils Relative: 2.1 %
HCT: 39.2 % (ref 35.0–45.0)
Hemoglobin: 13.5 g/dL (ref 11.7–15.5)
Lymphs Abs: 2513 cells/uL (ref 850–3900)
MCH: 30.9 pg (ref 27.0–33.0)
MCHC: 34.4 g/dL (ref 32.0–36.0)
MCV: 89.7 fL (ref 80.0–100.0)
MPV: 10.8 fL (ref 7.5–12.5)
Monocytes Relative: 7 %
Neutro Abs: 3822 cells/uL (ref 1500–7800)
Neutrophils Relative %: 54.6 %
Platelets: 351 10*3/uL (ref 140–400)
RBC: 4.37 10*6/uL (ref 3.80–5.10)
RDW: 14.1 % (ref 11.0–15.0)
Total Lymphocyte: 35.9 %
WBC: 7 10*3/uL (ref 3.8–10.8)

## 2022-07-16 LAB — BASIC METABOLIC PANEL
BUN: 11 mg/dL (ref 7–25)
CO2: 31 mmol/L (ref 20–32)
Calcium: 9.9 mg/dL (ref 8.6–10.4)
Chloride: 101 mmol/L (ref 98–110)
Creat: 0.66 mg/dL (ref 0.50–1.03)
Glucose, Bld: 112 mg/dL — ABNORMAL HIGH (ref 65–99)
Potassium: 4 mmol/L (ref 3.5–5.3)
Sodium: 141 mmol/L (ref 135–146)

## 2022-07-16 LAB — URINALYSIS, ROUTINE W REFLEX MICROSCOPIC
Bilirubin Urine: NEGATIVE
Glucose, UA: NEGATIVE
Hgb urine dipstick: NEGATIVE
Ketones, ur: NEGATIVE
Leukocytes,Ua: NEGATIVE
Nitrite: NEGATIVE
Protein, ur: NEGATIVE
Specific Gravity, Urine: 1.013 (ref 1.001–1.035)
pH: 5.5 (ref 5.0–8.0)

## 2022-07-16 LAB — IGA: Immunoglobulin A: 148 mg/dL (ref 47–310)

## 2022-07-16 LAB — TISSUE TRANSGLUTAMINASE, IGA: (tTG) Ab, IgA: <1 U/mL

## 2022-07-16 LAB — TSH: TSH: 0.93 mIU/L

## 2022-07-16 NOTE — Telephone Encounter (Signed)
Pt informed of HIDA scan procedure scheduled for 07/27/22 at 8:00 am, arrive at 7:45 am at [facility], nothing to eat or drink after midnight but can have plain water, no pain medication morning prior to procedure and expect to be there for 2-2.5 hours. Pt verbalized understanding.

## 2022-07-20 ENCOUNTER — Ambulatory Visit: Payer: Medicaid Other | Admitting: Pulmonary Disease

## 2022-07-20 ENCOUNTER — Encounter: Payer: Self-pay | Admitting: Pulmonary Disease

## 2022-07-20 VITALS — BP 112/78 | HR 76 | Ht 63.5 in | Wt 176.6 lb

## 2022-07-20 DIAGNOSIS — Z79899 Other long term (current) drug therapy: Secondary | ICD-10-CM

## 2022-07-20 DIAGNOSIS — D869 Sarcoidosis, unspecified: Secondary | ICD-10-CM

## 2022-07-20 NOTE — Progress Notes (Signed)
Subjective:   PATIENT ID: Courtney Hale GENDER: female DOB: Jul 03, 1966, MRN: [id number]   HPI  Chief Complaint  Patient presents with   Follow-up   Reason for Visit: Follow-up  [name] is a 56 year old female with sarcoid with pulmonary and bone involvement, glaucoma, history of melanoma s/p skin resections with DM2 who presents for follow-up sarcoidosis.  Synopsis: She was initially referred to [facility] shortness of breath with exertion and at rest.  She was seen by Dr. Melvyn Novas for sarcoid. She has been followed by Dr. Delton Coombes in hematology for multiple bone lesions after presenting for left hip pain since February 2022. PET demonstrated hypermetabolic mediastinal lymphadenopathy and right iliac, right sacral ala and anterior left frontal bone lesions. She underwent bone biopsy of right iliac bone lesion on 07/21/2021 which returned with noncaseating granulomatous inflammation. Dr. Melvyn Novas started patient on 20 mg prednisone and transferred to my care in November 2022.  2022 -Started on prednisone 20 mg on October and increased to 40 mg Dec. Continue to left hip and facial pain. 2023 - Methotrexate started March. Weaned off prednisone April. Referred to [facility] for persistent symptoms  07/20/22 Since our last visit she has been seen by [facility] by Dr. Flonnie Overman in Rheumatology. Plan to add Humira to her regimen. Continues to have bone pain (left hip) and shortness of breath that is unchanged on methotrexate. Facial pain seems to have improved. No wheezing or cough.   Past Medical History:  Diagnosis Date   Cancer ([place])    Skin   Complication of anesthesia    woke up during endoscopy and colonoscopy   Depression    Diabetes ([place])    GERD (gastroesophageal reflux disease)    Hemorrhoids    History of esophageal stricture    2011  peptic w/  dilatation   History of gastritis    2011   History of melanoma excision    2015  left leg/   12-09-2015 back    Hypertension     Microhematuria    Migraines    Nephrolithiasis    left side non-obstructive    Right ureteral stone    Sarcoidosis 07/24/2021   Urgency of urination    Wears contact lenses      Family History  Problem Relation Age of Onset   Hypertension Mother    Thyroid disease Mother    Diabetes Mother    Hyperlipidemia Mother    Stroke Mother    Heart disease Mother    Hyperlipidemia Father    Hypertension Father    Heart attack Father    Heart failure Father    Colon cancer Neg Hx      Social History   Occupational History   Not on file  Tobacco Use   Smoking status: Never    Passive exposure: Past   Smokeless tobacco: Never  Vaping Use   Vaping Use: Never used  Substance and Sexual Activity   Alcohol use: Not Currently   Drug use: No   Sexual activity: Not on file    Allergies  Allergen Reactions   Topamax [Topiramate] Other (See Comments)    Developed kidney stones   Fluorouracil Swelling    Burning skin   Sulfa Antibiotics Other (See Comments)    Burning sensation, flushing to skin     Outpatient Medications Prior to Visit  Medication Sig Dispense Refill   albuterol (VENTOLIN HFA) 108 (90 Base) MCG/ACT inhaler Inhale 2 puffs into the lungs every  6 (six) hours as needed for wheezing or shortness of breath. 8 g 6   BD PEN NEEDLE NANO 2ND GEN 32G X 4 MM MISC USE AS DIRECTED 100 each 0   busPIRone (BUSPAR) 5 MG tablet TAKE 1 TABLET BY MOUTH THREE TIMES DAILY (Patient taking differently: Take 5 mg by mouth 3 (three) times daily as needed (anxiety).) 45 tablet 2   calcium carbonate (TUMS - DOSED IN MG ELEMENTAL CALCIUM) 500 MG chewable tablet Chew 1,500 mg by mouth 2 (two) times daily as needed for indigestion or heartburn.     Carboxymethylcellul-Glycerin (LUBRICATING EYE DROPS OP) Place 1 drop into both eyes daily as needed (dry eyes).     Chromium 1000 MCG TABS Take 1,000 mcg by mouth daily.     CINNAMON PO Take 2,000 mg by mouth daily.     Continuous Blood Gluc  Receiver (FREESTYLE LIBRE 2 READER) DEVI Use as directed to check blood sugars. 1 each 0   Continuous Blood Gluc Sensor (FREESTYLE LIBRE 2 SENSOR) MISC Use sensor as directed. Change every 10-14 days. 4 each 3   Cyanocobalamin (B-12) 2000 MCG TABS Take 2,000 mcg by mouth daily.     dicyclomine (BENTYL) 10 MG capsule Take 1 capsule (10 mg total) by mouth up to 4 (four) times daily (before meals and at bedtime) for diarrhea and abdominal cramping. 90 capsule 0   EPINEPHrine 0.3 mg/0.3 mL IJ SOAJ injection Inject 0.3 mg into the muscle as needed for anaphylaxis. 1 each 0   folic acid (FOLVITE) 1 MG tablet Take 2 tablets (2 mg total) by mouth daily. Take 1 tablet daily for 14 days, then increase to 2 tablets daily thereafter. (Patient taking differently: Take 2 mg by mouth daily.) 90 tablet 2   hydrochlorothiazide (HYDRODIURIL) 25 MG tablet Take 1 tablet (25 mg total) by mouth daily. 90 tablet 1   [START ON 08/14/2022] HYDROcodone-acetaminophen (NORCO/VICODIN) 5-325 MG tablet Take 1 tablet by mouth 2 (two) times daily. For moderate to severe pain. 60 tablet 0   lansoprazole (PREVACID) 30 MG capsule Take 1 capsule (30 mg total) by mouth 2 (two) times daily before a meal. 60 capsule 3   losartan (COZAAR) 100 MG tablet Take 1 tablet by mouth once daily 90 tablet 0   metFORMIN (GLUCOPHAGE) 850 MG tablet TAKE 1 TABLET BY MOUTH TWICE DAILY WITH A MEAL 180 tablet 0   methotrexate (RHEUMATREX) 2.5 MG tablet Take 8 tablets (20 mg total) by mouth once a week. 32 tablet 4   PARoxetine (PAXIL) 40 MG tablet TAKE 1 TABLET BY MOUTH ONCE DAILY IN THE MORNING 90 tablet 0   Probiotic Product (PROBIOTIC PO) Take 1 capsule by mouth daily.     prochlorperazine (COMPAZINE) 10 MG tablet Take 1 tablet (10 mg total) by mouth every 6 (six) hours as needed for nausea or vomiting. 30 tablet 2   rizatriptan (MAXALT-MLT) 10 MG disintegrating tablet Take 1 tablet (10 mg total) by mouth as needed for migraine. May repeat in 2 hours if  needed. Max 2 per 24 hours 10 tablet 1   scopolamine (TRANSDERM-SCOP) 1 MG/3DAYS Place 1 patch (1.5 mg total) onto the skin every 3 (three) days. 10 patch 12   SEMGLEE, YFGN, 100 UNIT/ML Pen INJECT 35 UNITS SUBCUTANEOUSLY EVERY NIGHT 15 mL 0   sucralfate (CARAFATE) 1 g tablet Take 1 tablet (1 g total) by mouth 4 (four) times daily -  with meals and at bedtime. 120 tablet 1   traZODone (DESYREL)  150 MG tablet Take 1 tablet (150 mg total) by mouth at bedtime as needed for sleep. 90 tablet 3   valACYclovir (VALTREX) 500 MG tablet Take 500 mg by mouth 2 (two) times daily as needed (outbreaks).     No facility-administered medications prior to visit.    Review of Systems  Constitutional:  Negative for chills, diaphoresis, fever, malaise/fatigue and weight loss.  HENT:  Negative for congestion.   Respiratory:  Positive for shortness of breath. Negative for cough, hemoptysis, sputum production and wheezing.   Cardiovascular:  Negative for chest pain, palpitations and leg swelling.  Musculoskeletal:  Positive for joint pain.     Objective:   Vitals:   07/20/22 1050  BP: 112/78  Pulse: 76  SpO2: 97%  Weight: 176 lb 9.6 oz (80.1 kg)  Height: 5' 3.5" (1.613 m)  SpO2: 97 % O2 Device: None (Room air)  Physical Exam: General: Well-appearing, no acute distress HENT: [place], AT Eyes: EOMI, no scleral icterus Respiratory: Clear to auscultation bilaterally.  No crackles, wheezing or rales Cardiovascular: RRR, -M/R/G, no JVD Extremities:-Edema,-tenderness Neuro: AAO x4, CNII-XII grossly intact Psych: Normal mood, normal affect  Data Reviewed:  Imaging: PET 2/69/48 - Hypermetabolic bilateral hilar and mediastinal lymph nodules, right ilac lesion, right sacral ala, right scapular spine, left scapular spine and left frontal bone Bone scan 07/08/22 - Abnormal tracer superior RIGHT scapula, LEFT frontal bone, and RIGHT SI joint  PFT: 09/30/21 FVC 2.56 (73%) FEV1 2.20 (81%) Ratio 86  TLC 71% DLCO  112% Interpretation: Mild restrictive defect with normal DLCO.  Cardiac: Cardiac MRI 03/24/22 - No evidence for cardiac involvement by sarcoidosis.  Labs:    Latest Ref Rng & Units 07/15/2022    3:11 PM 06/04/2022    5:46 PM 04/19/2022   12:04 PM  CMP  Glucose 65 - 99 mg/dL 112  100  151   BUN 7 - 25 mg/dL _0 Creatinine 0.50 - 1.03 mg/dL 0.66  0.70  0.63   Sodium 135 - 146 mmol/L 141  138  139   Potassium 3.5 - 5.3 mmol/L 4.0  3.2  3.0   Chloride 98 - 110 mmol/L 101  105  100   CO2 20 - 32 mmol/L _1 Calcium 8.6 - 10.4 mg/dL 9.9  8.9  9.5   Total Protein 6.0 - 8.3 g/dL   6.5   Total Bilirubin 0.2 - 1.2 mg/dL   0.5   Alkaline Phos 39 - 117 U/L   59   AST 0 - 37 U/L   26   ALT 0 - 35 U/L   32   Normal electrolytes and kidney function  CBC    Component Value Date/Time   WBC 7.0 07/15/2022 1511   RBC 4.37 07/15/2022 1511   HGB 13.5 07/15/2022 1511   HGB 13.4 11/18/2021 1026   HCT 39.2 07/15/2022 1511   HCT 38.9 11/18/2021 1026   PLT 351 07/15/2022 1511   PLT 322 11/18/2021 1026   MCV 89.7 07/15/2022 1511   MCV 87 11/18/2021 1026   MCH 30.9 07/15/2022 1511   MCHC 34.4 07/15/2022 1511   RDW 14.1 07/15/2022 1511   RDW 12.5 11/18/2021 1026   LYMPHSABS 2,513 07/15/2022 1511   LYMPHSABS 1.8 11/18/2021 1026   MONOABS 0.6 04/19/2022 1204   EOSABS 147 07/15/2022 1511   EOSABS 0.0 11/18/2021 1026   BASOSABS 28 07/15/2022 1511   BASOSABS 0.0 11/18/2021 1026  Normal blood counts  08/14/21 ACE 33 ESR  20    Assessment & Plan:   Discussion: 56 year old female with sarcoid with pulmonary and bone involvement, glaucoma, history of melanoma s/p skin resections with DM2 who presents for follow-up sarcoidosis. Uncontrolled symptoms likely related to sarcoid. Will start Humira with [facility]. Continuing care with me for continuity.  Sarcoidosis with lung and bone involvement - Refractory symptoms --Dx in 06/2021 via right iliac bone biopsy --Annual PFTs.   Last PFTs - 09/2021 --Recommend annual ophthalmology exam. Last seen 11/10/21 --EKG reviewed. No evidence of conduction abnormalities.  --Cardiac MRI 03/24/22 - No evidence for cardiac involvement by sarcoidosis.  Pulmonary sarcoid with restrictive defect Shortness of breath --CONTINUE Albuterol TWO puffs as needed for shortness of breath --May benefit from addition of Humira or change to another immunosuppressant. Discuss at next visit  Chronic immunosuppression/High risk medication use --08/14/21 Treated with prednisone 20 mg ~8 weeks --10/01/21 - Prednisone 40 mg ~6 weeks --12/03/21 Methotrexate weekly initiated with folic acid 2 mg daily --02/14/22 Weaned off steroids --04/19/22 - current. On methotrexate 20 mg weekly --[facility] planning to start Humira   Osteoporosis prevention in setting of chronic steroid use --CONTINUE vitamin D 600-800 units daily through diet or supplements  Health Maintenance Immunization History  Administered Date(s) Administered   Influenza,inj,Quad PF,6+ Mos 09/09/2016, 08/29/2017, 09/27/2019, 10/30/2020, 08/14/2021   Influenza-Unspecified 08/22/2013, 07/22/2014, 08/08/2015   MMR 01/22/2011   PFIZER(Purple Top)SARS-COV-2 Vaccination 12/30/2019, 01/20/2020   Tdap 01/22/2011, 04/17/2014   CT Lung Screen - not indicated. Insufficient tobacco history  No orders of the defined types were placed in this encounter.  No orders of the defined types were placed in this encounter.  Return in about 6 months (around 01/18/2023).  I have spent a total time of 31-minutes on the day of the appointment including chart review, data review, collecting history, coordinating care and discussing medical diagnosis and plan with the patient/family. Past medical history, allergies, medications were reviewed. Pertinent imaging, labs and tests included in this note have been reviewed and interpreted independently by me.  [place], MD [facility] 07/20/2022 10:57 AM  Office Number [phone]

## 2022-07-20 NOTE — Patient Instructions (Addendum)
Sarcoidosis with lung and bone involvement Pulmonary sarcoid with restrictive defect --[facility] planning to start Humira  --Continue care with me to maintain [facility] access  Follow-up with me in 6 months

## 2022-07-21 ENCOUNTER — Ambulatory Visit (HOSPITAL_COMMUNITY)
Admission: RE | Admit: 2022-07-21 | Discharge: 2022-07-21 | Disposition: A | Discharge status: Home / Self Care | Payer: Medicaid Other | Source: Ambulatory Visit | Attending: Gastroenterology | Admitting: Gastroenterology

## 2022-07-21 ENCOUNTER — Encounter: Payer: Self-pay | Admitting: Nutrition

## 2022-07-21 DIAGNOSIS — R1032 Left lower quadrant pain: Secondary | ICD-10-CM | POA: Insufficient documentation

## 2022-07-21 DIAGNOSIS — R112 Nausea with vomiting, unspecified: Secondary | ICD-10-CM | POA: Diagnosis not present

## 2022-07-21 DIAGNOSIS — R1010 Upper abdominal pain, unspecified: Secondary | ICD-10-CM | POA: Insufficient documentation

## 2022-07-21 DIAGNOSIS — N281 Cyst of kidney, acquired: Secondary | ICD-10-CM | POA: Diagnosis not present

## 2022-07-21 DIAGNOSIS — N200 Calculus of kidney: Secondary | ICD-10-CM | POA: Diagnosis not present

## 2022-07-21 DIAGNOSIS — R634 Abnormal weight loss: Secondary | ICD-10-CM | POA: Diagnosis not present

## 2022-07-21 MED ORDER — IOHEXOL 300 MG/ML  SOLN
100.0000 mL | Freq: Once | INTRAMUSCULAR | Status: AC | PRN
  Administered 2022-07-21: 100 mL via INTRAVENOUS

## 2022-07-22 ENCOUNTER — Other Ambulatory Visit (HOSPITAL_COMMUNITY): Payer: Medicaid Other

## 2022-07-23 ENCOUNTER — Encounter: Payer: Self-pay | Admitting: Pulmonary Disease

## 2022-07-26 ENCOUNTER — Telehealth: Payer: Self-pay | Admitting: *Deleted

## 2022-07-26 ENCOUNTER — Other Ambulatory Visit: Payer: Self-pay | Admitting: *Deleted

## 2022-07-26 DIAGNOSIS — R9389 Abnormal findings on diagnostic imaging of other specified body structures: Secondary | ICD-10-CM

## 2022-07-26 NOTE — Telephone Encounter (Signed)
PA for MRI: Order ID: [id number]       Authorized  Approval Valid Through: 07/26/2022 - 09/23/2022

## 2022-07-27 ENCOUNTER — Encounter (HOSPITAL_COMMUNITY)
Admission: RE | Admit: 2022-07-27 | Discharge: 2022-07-27 | Disposition: A | Discharge status: Home / Self Care | Payer: Medicaid Other | Source: Ambulatory Visit | Attending: Gastroenterology | Admitting: Gastroenterology

## 2022-07-27 ENCOUNTER — Encounter (HOSPITAL_COMMUNITY): Payer: Self-pay

## 2022-07-27 DIAGNOSIS — R1010 Upper abdominal pain, unspecified: Secondary | ICD-10-CM | POA: Diagnosis not present

## 2022-07-27 DIAGNOSIS — R112 Nausea with vomiting, unspecified: Secondary | ICD-10-CM | POA: Diagnosis not present

## 2022-07-27 DIAGNOSIS — K219 Gastro-esophageal reflux disease without esophagitis: Secondary | ICD-10-CM | POA: Diagnosis not present

## 2022-07-27 MED ORDER — TECHNETIUM TC 99M MEBROFENIN IV KIT
5.0000 | PACK | Freq: Once | INTRAVENOUS | Status: AC | PRN
  Administered 2022-07-27: 5.1 via INTRAVENOUS

## 2022-07-28 ENCOUNTER — Other Ambulatory Visit: Payer: Self-pay | Admitting: Family Medicine

## 2022-07-28 DIAGNOSIS — E1165 Type 2 diabetes mellitus with hyperglycemia: Secondary | ICD-10-CM

## 2022-07-29 ENCOUNTER — Other Ambulatory Visit: Payer: Self-pay | Admitting: *Deleted

## 2022-07-29 ENCOUNTER — Ambulatory Visit: Payer: Medicaid Other | Admitting: Nurse Practitioner

## 2022-07-29 ENCOUNTER — Encounter: Payer: Self-pay | Admitting: Nurse Practitioner

## 2022-07-29 ENCOUNTER — Other Ambulatory Visit: Payer: Self-pay | Admitting: Pulmonary Disease

## 2022-07-29 ENCOUNTER — Ambulatory Visit: Payer: Medicaid Other | Admitting: Nutrition

## 2022-07-29 VITALS — BP 93/63 | HR 82 | Ht 63.5 in | Wt 176.8 lb

## 2022-07-29 DIAGNOSIS — I10 Essential (primary) hypertension: Secondary | ICD-10-CM | POA: Diagnosis not present

## 2022-07-29 DIAGNOSIS — Z794 Long term (current) use of insulin: Secondary | ICD-10-CM | POA: Diagnosis not present

## 2022-07-29 DIAGNOSIS — R112 Nausea with vomiting, unspecified: Secondary | ICD-10-CM

## 2022-07-29 DIAGNOSIS — E119 Type 2 diabetes mellitus without complications: Secondary | ICD-10-CM | POA: Diagnosis not present

## 2022-07-29 DIAGNOSIS — R1010 Upper abdominal pain, unspecified: Secondary | ICD-10-CM

## 2022-07-29 DIAGNOSIS — D869 Sarcoidosis, unspecified: Secondary | ICD-10-CM

## 2022-07-29 DIAGNOSIS — E782 Mixed hyperlipidemia: Secondary | ICD-10-CM

## 2022-07-29 DIAGNOSIS — D849 Immunodeficiency, unspecified: Secondary | ICD-10-CM

## 2022-07-29 DIAGNOSIS — Z79899 Other long term (current) drug therapy: Secondary | ICD-10-CM

## 2022-07-29 LAB — POCT GLYCOSYLATED HEMOGLOBIN (HGB A1C): Hemoglobin A1C: 6 % — AB (ref 4.0–5.6)

## 2022-07-29 LAB — POCT UA - MICROALBUMIN

## 2022-07-29 MED ORDER — SEMGLEE (YFGN) 100 UNIT/ML ~~LOC~~ SOPN: PEN_INJECTOR | SUBCUTANEOUS | 3 refills | Status: DC

## 2022-07-29 NOTE — Progress Notes (Signed)
Endocrinology Follow Up Note       07/29/2022, 1:28 PM   Subjective:    Patient ID: Courtney Hale, female    DOB: 1966-02-09.  Courtney Hale is being seen in follow up after being seen in consultation for management of currently uncontrolled symptomatic diabetes requested by  Coral Spikes, DO.   Past Medical History:  Diagnosis Date   Cancer [facility])    Skin   Complication of anesthesia    woke up during endoscopy and colonoscopy   Depression    Diabetes ([place])    GERD (gastroesophageal reflux disease)    Hemorrhoids    History of esophageal stricture    2011  peptic w/  dilatation   History of gastritis    2011   History of melanoma excision    2015  left leg/   12-09-2015 back    Hypertension    Microhematuria    Migraines    Nephrolithiasis    left side non-obstructive    Right ureteral stone    Sarcoidosis 07/24/2021   Urgency of urination    Wears contact lenses     Past Surgical History:  Procedure Laterality Date   ABDOMINAL HYSTERECTOMY     BALLOON DILATION N/A 05/03/2022   Procedure: BALLOON DILATION;  Surgeon: Eloise Harman, DO;  [facility];  Service: Endoscopy;  Laterality: N/A;   BIOPSY  11/16/2021   Procedure: BIOPSY;  Surgeon: Eloise Harman, DO;  [facility];  Service: Endoscopy;;   BIOPSY  05/03/2022   Procedure: BIOPSY;  Surgeon: Eloise Harman, DO;  [facility]: [facility];  Service: Endoscopy;;   CARPAL TUNNEL RELEASE Right 10/29/2013   COLONOSCOPY N/A 08/27/2015   Procedure: COLONOSCOPY;  Surgeon: Danie Binder, MD;  Location: [facility];  Service: Endoscopy;  Laterality: N/A;  0830   COLONOSCOPY WITH PROPOFOL N/A 11/16/2021   Surgeon: Eloise Harman, DO;  Nonbleeding internal hemorrhoids, otherwise normal exam.  Recommended 10-year screening colonoscopy.   CYST EXCISION  10/25/2010   right hand   CYSTOSCOPY W/ URETERAL STENT  PLACEMENT Right 12/22/2015   Procedure: CYSTOSCOPY WITH RETROGRADE PYELOGRAM/URETERAL STENT PLACEMENT;  Surgeon: Nickie Retort, MD;  Location: [facility];  Service: Urology;  Laterality: Right;   CYSTOSCOPY WITH RETROGRADE PYELOGRAM, URETEROSCOPY AND STENT PLACEMENT Right 01/05/2016   Procedure: CYSTOSCOPY WITH RETROGRADE PYELOGRAM, URETEROSCOPY AND STENT REPLACEMENT;  Surgeon: Nickie Retort, MD;  Location: [facility];  Service: Urology;  Laterality: Right;   DILATION AND CURETTAGE OF UTERUS  03/12/2009   w/  Suction   double balloon enteroscopy     Dr. Arsenio Loader at [facility]: no erosions, no evidence of Crohn's disease, path without Crohn's.    EGD with push enteroscopy  02/12/2010    patent distal peptic stricture with diffuse antral erythema, normal D1 and D2    ESOPHAGOGASTRODUODENOSCOPY (EGD) WITH PROPOFOL N/A 11/16/2021   Surgeon: Eloise Harman, DO;   Gastritis biopsied, otherwise normal exam.  Pathology with mild chronic gastritis, negative for H. pylori.   ESOPHAGOGASTRODUODENOSCOPY (EGD) WITH PROPOFOL N/A 05/03/2022   Surgeon: Eloise Harman, DO;  Normal esophagus s/p empiric dilation,  gastritis biopsied.  Pathology with mild chronic, focally active gastritis, negative for H. pylori.   HAND SURGERY     LAPAROSCOPIC ASSISTED VAGINAL HYSTERECTOMY  10/26/2014   w/  Bilateral Salpingoophorectomy   STONE EXTRACTION WITH BASKET Right 01/05/2016   Procedure: STONE EXTRACTION WITH BASKET;  Surgeon: Nickie Retort, MD;  Location: [facility];  Service: Urology;  Laterality: Right;   TONSILLECTOMY  1998  approx    Social History   Socioeconomic History   Marital status: Single    Spouse name: Not on file   Number of children: Not on file   Years of education: Not on file   Highest education level: Not on file  Occupational History   Not on file  Tobacco Use   Smoking status: Never    Passive exposure: Past   Smokeless tobacco: Never   Vaping Use   Vaping Use: Never used  Substance and Sexual Activity   Alcohol use: Not Currently   Drug use: No   Sexual activity: Not on file  Other Topics Concern   Not on file  Social History Narrative   Not on file   Social Determinants of Health   Financial Resource Strain: Low Risk  (06/30/2021)   Overall Financial Resource Strain (CARDIA)    Difficulty of Paying Living Expenses: Not hard at all  Food Insecurity: No Food Insecurity (06/30/2021)   Hunger Vital Sign    Worried About Running Out of Food in the Last Year: Never true    [place] in the Last Year: Never true  Transportation Needs: No Transportation Needs (06/30/2021)   PRAPARE - Hydrologist (Medical): No    Lack of Transportation (Non-Medical): No  Physical Activity: Inactive (06/30/2021)   Exercise Vital Sign    Days of Exercise per Week: 0 days    Minutes of Exercise per Session: 0 min  Stress: Stress Concern Present (06/30/2021)   [place]    Feeling of Stress : Very much  Social Connections: Socially Isolated (06/30/2021)   Social Connection and Isolation Panel [NHANES]    Frequency of Communication with Friends and Family: More than three times a week    Frequency of Social Gatherings with Friends and Family: More than three times a week    Attends Religious Services: Never    Marine scientist or Organizations: No    Attends Music therapist: Never    Marital Status: Never married    Family History  Problem Relation Age of Onset   Hypertension Mother    Thyroid disease Mother    Diabetes Mother    Hyperlipidemia Mother    Stroke Mother    Heart disease Mother    Hyperlipidemia Father    Hypertension Father    Heart attack Father    Heart failure Father    Colon cancer Neg Hx     Outpatient Encounter Medications as of 07/29/2022  Medication Sig   albuterol (VENTOLIN HFA) 108 (90  Base) MCG/ACT inhaler Inhale 2 puffs into the lungs every 6 (six) hours as needed for wheezing or shortness of breath.   BD PEN NEEDLE NANO 2ND GEN 32G X 4 MM MISC USE AS DIRECTED   busPIRone (BUSPAR) 5 MG tablet TAKE 1 TABLET BY MOUTH THREE TIMES DAILY (Patient taking differently: Take 5 mg by mouth 3 (three) times daily as needed (anxiety).)   calcium carbonate (TUMS -  DOSED IN MG ELEMENTAL CALCIUM) 500 MG chewable tablet Chew 1,500 mg by mouth 2 (two) times daily as needed for indigestion or heartburn.   Carboxymethylcellul-Glycerin (LUBRICATING EYE DROPS OP) Place 1 drop into both eyes daily as needed (dry eyes).   Chromium 1000 MCG TABS Take 1,000 mcg by mouth daily.   CINNAMON PO Take 2,000 mg by mouth daily.   Continuous Blood Gluc Receiver (FREESTYLE LIBRE 2 READER) DEVI Use as directed to check blood sugars.   Continuous Blood Gluc Sensor (FREESTYLE LIBRE 2 SENSOR) MISC Use sensor as directed. Change every 10-14 days.   Cyanocobalamin (B-12) 2000 MCG TABS Take 2,000 mcg by mouth daily.   dicyclomine (BENTYL) 10 MG capsule Take 1 capsule (10 mg total) by mouth up to 4 (four) times daily (before meals and at bedtime) for diarrhea and abdominal cramping.   EPINEPHrine 0.3 mg/0.3 mL IJ SOAJ injection Inject 0.3 mg into the muscle as needed for anaphylaxis.   folic acid (FOLVITE) 1 MG tablet Take 2 tablets (2 mg total) by mouth daily. Take 1 tablet daily for 14 days, then increase to 2 tablets daily thereafter. (Patient taking differently: Take 2 mg by mouth daily.)   hydrochlorothiazide (HYDRODIURIL) 25 MG tablet Take 1 tablet (25 mg total) by mouth daily.   [START ON 08/14/2022] HYDROcodone-acetaminophen (NORCO/VICODIN) 5-325 MG tablet Take 1 tablet by mouth 2 (two) times daily. For moderate to severe pain.   lansoprazole (PREVACID) 30 MG capsule Take 1 capsule (30 mg total) by mouth 2 (two) times daily before a meal.   losartan (COZAAR) 100 MG tablet Take 1 tablet by mouth once daily    metFORMIN (GLUCOPHAGE) 850 MG tablet TAKE 1 TABLET BY MOUTH TWICE DAILY WITH A MEAL   methotrexate (RHEUMATREX) 2.5 MG tablet Take 8 tablets (20 mg total) by mouth once a week.   PARoxetine (PAXIL) 40 MG tablet TAKE 1 TABLET BY MOUTH ONCE DAILY IN THE MORNING   Probiotic Product (PROBIOTIC PO) Take 1 capsule by mouth daily.   prochlorperazine (COMPAZINE) 10 MG tablet Take 1 tablet (10 mg total) by mouth every 6 (six) hours as needed for nausea or vomiting.   rizatriptan (MAXALT-MLT) 10 MG disintegrating tablet Take 1 tablet (10 mg total) by mouth as needed for migraine. May repeat in 2 hours if needed. Max 2 per 24 hours   scopolamine (TRANSDERM-SCOP) 1 MG/3DAYS Place 1 patch (1.5 mg total) onto the skin every 3 (three) days.   sucralfate (CARAFATE) 1 g tablet Take 1 tablet (1 g total) by mouth 4 (four) times daily -  with meals and at bedtime.   traZODone (DESYREL) 150 MG tablet Take 1 tablet (150 mg total) by mouth at bedtime as needed for sleep.   valACYclovir (VALTREX) 500 MG tablet Take 500 mg by mouth 2 (two) times daily as needed (outbreaks).   [DISCONTINUED] SEMGLEE, YFGN, 100 UNIT/ML Pen INJECT 35 UNITS SUBCUTANEOUSLY EVERY NIGHT   SEMGLEE, YFGN, 100 UNIT/ML Pen INJECT 30 UNITS SUBCUTANEOUSLY EVERY NIGHT   [DISCONTINUED] SEMGLEE, YFGN, 100 UNIT/ML Pen INJECT 35 UNITS SUBCUTANEOUSLY EVERY NIGHT   No facility-administered encounter medications on file as of 07/29/2022.    ALLERGIES: Allergies  Allergen Reactions   Topamax [Topiramate] Other (See Comments)    Developed kidney stones   Fluorouracil Swelling    Burning skin   Sulfa Antibiotics Other (See Comments)    Burning sensation, flushing to skin    VACCINATION STATUS: Immunization History  Administered Date(s) Administered   Influenza,inj,Quad PF,6+ Mos  09/09/2016, 08/29/2017, 09/27/2019, 10/30/2020, 08/14/2021   Influenza-Unspecified 08/22/2013, 07/22/2014, 08/08/2015   MMR 01/22/2011   PFIZER(Purple Top)SARS-COV-2  Vaccination 12/30/2019, 01/20/2020   Tdap 01/22/2011, 04/17/2014    Diabetes She presents for her follow-up diabetic visit. She has type 2 diabetes mellitus. Onset time: diagnosed at approx age of 26. Her disease course has been improving. There are no hypoglycemic associated symptoms. There are no diabetic associated symptoms. There are no hypoglycemic complications. There are no diabetic complications. Risk factors for coronary artery disease include diabetes mellitus, dyslipidemia, family history, hypertension and obesity. Current diabetic treatment includes intensive insulin program. She is compliant with treatment most of the time. Her weight is fluctuating minimally. She is following a generally healthy diet. When asked about meal planning, she reported none. She has not had a previous visit with a dietitian. She participates in exercise intermittently. Her overall blood glucose range is 130-140 mg/dl. (She presents today with her CGM, no logs, showing gap in data since Sept 26.  She has had to remove several sensors due to testing for medical issues, therefore has waited to put another one on until her testing is complete.  Her POCT A1c today is 6%, improving from last visit of 6.4%.  She denies any significant hypoglycemia.  Analysis of her CGM shows TIR 88%, TAR 12%, TBR 0%.) An ACE inhibitor/angiotensin II receptor blocker is being taken. She does not see a podiatrist.Eye exam is current.  Hyperlipidemia This is a chronic problem. The current episode started more than 1 year ago. The problem is uncontrolled. Recent lipid tests were reviewed and are high. Exacerbating diseases include diabetes and obesity. Factors aggravating her hyperlipidemia include thiazides and fatty foods. She is currently on no antihyperlipidemic treatment. Compliance problems include adherence to diet and adherence to exercise.  Risk factors for coronary artery disease include diabetes mellitus, dyslipidemia, family history,  obesity and hypertension.  Hypertension This is a chronic problem. The current episode started more than 1 year ago. The problem has been resolved since onset. The problem is controlled. Agents associated with hypertension include steroids. Risk factors for coronary artery disease include diabetes mellitus, dyslipidemia, family history and obesity. Past treatments include diuretics and angiotensin blockers. The current treatment provides significant improvement. There are no compliance problems.      Review of systems  Constitutional: + Minimally fluctuating body weight, current Body mass index is 30.83 kg/m., no fatigue, no subjective hyperthermia, no subjective hypothermia Eyes: no blurry vision, no xerophthalmia ENT: no sore throat, no nodules palpated in throat, no dysphagia/odynophagia, no hoarseness Cardiovascular: no chest pain, no shortness of breath, no palpitations, no leg swelling Respiratory: no cough, no shortness of breath Gastrointestinal: no nausea/vomiting/diarrhea Musculoskeletal: generalized aches and pains- says r/t sarcoidosis Skin: no rashes, no hyperemia Neurological: no tremors, no numbness, no tingling, no dizziness Psychiatric: no depression, no anxiety  Objective:     BP 93/63 (BP Location: Left Arm, Patient Position: Sitting, Cuff Size: Normal)   Pulse 82   Ht 5' 3.5" (1.613 m)   Wt 176 lb 12.8 oz (80.2 kg)   LMP 10/26/2014   BMI 30.83 kg/m   Wt Readings from Last 3 Encounters:  07/29/22 176 lb 12.8 oz (80.2 kg)  07/20/22 176 lb 9.6 oz (80.1 kg)  07/15/22 174 lb 9.6 oz (79.2 kg)     BP Readings from Last 3 Encounters:  07/29/22 93/63  07/20/22 112/78  07/15/22 111/72      Physical Exam- Limited  Constitutional:  Body mass index is  30.83 kg/m. , not in acute distress, normal state of mind Eyes:  EOMI, no exophthalmos Neck: Supple Cardiovascular: RRR, no murmurs, rubs, or gallops, no edema Respiratory: Adequate breathing efforts, no  crackles, rales, rhonchi, or wheezing Musculoskeletal: no gross deformities, strength intact in all four extremities, no gross restriction of joint movements Skin:  no rashes, no hyperemia Neurological: no tremor with outstretched hands  Diabetic Foot Exam - Simple   Simple Foot Form Diabetic Foot exam was performed with the following findings: Yes 07/29/2022  1:25 PM  Visual Inspection No deformities, no ulcerations, no other skin breakdown bilaterally: Yes Sensation Testing Intact to touch and monofilament testing bilaterally: Yes Pulse Check Posterior Tibialis and Dorsalis pulse intact bilaterally: Yes Comments     CMP ( most recent) CMP     Component Value Date/Time   NA 141 07/15/2022 1511   NA 136 11/18/2021 1026   K 4.0 07/15/2022 1511   CL 101 07/15/2022 1511   CO2 31 07/15/2022 1511   GLUCOSE 112 (H) 07/15/2022 1511   BUN 11 07/15/2022 1511   BUN 19 11/18/2021 1026   CREATININE 0.66 07/15/2022 1511   CALCIUM 9.9 07/15/2022 1511   PROT 6.5 04/19/2022 1204   PROT 6.1 11/18/2021 1026   ALBUMIN 4.3 04/19/2022 1204   ALBUMIN 4.2 11/18/2021 1026   AST 26 04/19/2022 1204   ALT 32 04/19/2022 1204   ALKPHOS 59 04/19/2022 1204   BILITOT 0.5 04/19/2022 1204   BILITOT 0.3 11/18/2021 1026   GFRNONAA >60 06/04/2022 1746   GFRAA 121 08/27/2020 1517     Diabetic Labs (most recent): Lab Results  Component Value Date   HGBA1C 6.0 (A) 07/29/2022   HGBA1C 6.4 02/25/2022   HGBA1C 7.9 (H) 09/25/2021   MICROALBUR 95m/L 07/29/2022     Lipid Panel ( most recent) Lipid Panel     Component Value Date/Time   CHOL 258 (H) 09/25/2021 1134   TRIG 169 (H) 09/25/2021 1134   HDL 59 09/25/2021 1134   CHOLHDL 4.4 09/25/2021 1134   LDLCALC 168 (H) 09/25/2021 1134   LABVLDL 31 09/25/2021 1134      Lab Results  Component Value Date   TSH 0.93 07/15/2022   TSH 0.996 08/27/2020   TSH 1.220 06/09/2016   TSH 0.785 08/13/2015           Assessment & Plan:   1) Controlled  type 2 diabetes mellitus without complication  She presents today with her CGM, no logs, showing gap in data since Sept 26.  She has had to remove several sensors due to testing for medical issues, therefore has waited to put another one on until her testing is complete.  Her POCT A1c today is 6%, improving from last visit of 6.4%.  She denies any significant hypoglycemia.  Analysis of her CGM shows TIR 88%, TAR 12%, TBR 0%.  - AJEANICE DEMPSEYhas currently uncontrolled symptomatic type 2 DM since 56years of age.   -Recent labs reviewed.  - I had a long discussion with her about the progressive nature of diabetes and the pathology behind its complications. -her diabetes is complicated by chronic steroid use due to sarcoidosis and she remains at a high risk for more acute and chronic complications which include CAD, CVA, CKD, retinopathy, and neuropathy. These are all discussed in detail with her.  The following Lifestyle Medicine recommendations according to [facility] were discussed and offered to patient and she agrees to start the journey:  A. Whole Foods, Plant-based plate comprising of fruits and vegetables, plant-based proteins, whole-grain carbohydrates was discussed in detail with the patient.   A list for source of those nutrients were also provided to the patient.  Patient will use only water or unsweetened tea for hydration. B.  The need to stay away from risky substances including alcohol, smoking; obtaining 7 to 9 hours of restorative sleep, at least 150 minutes of moderate intensity exercise weekly, the importance of healthy social connections,  and stress reduction techniques were discussed. C.  A full color page of  Calorie density of various food groups per pound showing examples of each food groups was provided to the patient.  - Nutritional counseling repeated at each appointment due to patients tendency to fall back in to old habits.  - The  patient admits there is a room for improvement in their diet and drink choices. -  Suggestion is made for the patient to avoid simple carbohydrates from their diet including Cakes, Sweet Desserts / Pastries, Ice Cream, Soda (diet and regular), Sweet Tea, Candies, Chips, Cookies, Sweet Pastries, Store Bought Juices, Alcohol in Excess of 1-2 drinks a day, Artificial Sweeteners, Coffee Creamer, and "Sugar-free" Products. This will help patient to have stable blood glucose profile and potentially avoid unintended weight gain.   - I encouraged the patient to switch to unprocessed or minimally processed complex starch and increased protein intake (animal or plant source), fruits, and vegetables.   - Patient is advised to stick to a routine mealtimes to eat 3 meals a day and avoid unnecessary snacks (to snack only to correct hypoglycemia).  - she sees Jearld Fenton, RDN, CDE for diabetes education.  - I have approached her with the following individualized plan to manage her diabetes and patient agrees:   -She is to continue her Semglee 30 units SQ nightly and Metformin 850 mg po twice daily with meals.  -she is encouraged to continue monitoring glucose 4 times daily (using her CGM), before meals and before bed, to log their readings on the clinic sheets provided, and to call the clinic if she has readings less than 70 or above 300 for 3 tests in a row.  - she is warned not to take insulin without proper monitoring per orders. - Adjustment parameters are given to her for hypo and hyperglycemia in writing.  - she will be considered for incretin therapy as appropriate next visit. - She is allergic to sulfa meds.  - Specific targets for  A1c; LDL, HDL, and Triglycerides were discussed with the patient.  2) Blood Pressure /Hypertension:  her blood pressure is controlled to target.   she is advised to continue her current medications including Losartan 100 mg p.o. daily with breakfast and HCTZ 25 mg po  daily.  3) Lipids/Hyperlipidemia:    Review of her recent lipid panel from 09/25/21 showed uncontrolled LDL at 168 and slightly elevated triglycerides of 169.  She is not currently on any lipid lowering medications.   4)  Weight/Diet:  her Body mass index is 30.83 kg/m.  -  clearly complicating her diabetes care.   she is a candidate for weight loss. I discussed with her the fact that loss of 5 - 10% of her  current body weight will have the most impact on her diabetes management.  Exercise, and detailed carbohydrates information provided  -  detailed on discharge instructions.  5) Chronic Care/Health Maintenance: -she is on ACEI/ARB and not on Statin medications and is encouraged  to initiate and continue to follow up with Ophthalmology, Dentist, Podiatrist at least yearly or according to recommendations, and advised to stay away from smoking. I have recommended yearly flu vaccine and pneumonia vaccine at least every 5 years; moderate intensity exercise for up to 150 minutes weekly; and sleep for at least 7 hours a day.  - she is advised to maintain close follow up with Coral Spikes, DO for primary care needs, as well as her other providers for optimal and coordinated care.      I spent 31 minutes in the care of the patient today including review of labs from [place], Lipids, Thyroid Function, Hematology (current and previous including abstractions from other facilities); face-to-face time discussing  her blood glucose readings/logs, discussing hypoglycemia and hyperglycemia episodes and symptoms, medications doses, her options of short and long term treatment based on the latest standards of care / guidelines;  discussion about incorporating lifestyle medicine;  and documenting the encounter. Risk reduction counseling performed per USPSTF guidelines to reduce obesity and cardiovascular risk factors.     Please refer to Patient Instructions for Blood Glucose Monitoring and Insulin/Medications Dosing  Guide"  in media tab for additional information. Please  also refer to " Patient Self Inventory" in the Media  tab for reviewed elements of pertinent patient history.  Benard Rink participated in the discussions, expressed understanding, and voiced agreement with the above plans.  All questions were answered to her satisfaction. she is encouraged to contact clinic should she have any questions or concerns prior to her return visit.     Follow up plan: - Return in about 4 months (around 11/29/2022) for Diabetes F/U with A1c in office, No previsit labs, Bring meter and logs.   Rayetta Pigg, [facility] [address] [place], [place] [address] Phone: [phone] Fax: [phone]  07/29/2022, 1:28 PM

## 2022-08-05 ENCOUNTER — Other Ambulatory Visit (HOSPITAL_COMMUNITY): Payer: Medicaid Other

## 2022-08-11 ENCOUNTER — Encounter: Payer: Self-pay | Admitting: General Surgery

## 2022-08-11 ENCOUNTER — Ambulatory Visit: Payer: Medicaid Other | Admitting: General Surgery

## 2022-08-11 VITALS — BP 99/68 | HR 93 | Temp 98.2°F | Resp 14 | Ht 63.5 in | Wt 176.0 lb

## 2022-08-11 DIAGNOSIS — K828 Other specified diseases of gallbladder: Secondary | ICD-10-CM

## 2022-08-11 NOTE — Progress Notes (Signed)
Courtney Hale; [id number]; 17-Jan-1966   HPI Patient is a 56 year old white female who was referred back to my care by GI for evaluation and treatment of biliary dyskinesia.  She has had over the past 4 to 6 months persistent nausea, occasional emesis, and epigastric and upper abdominal pain.  This seems to occur both with eating and spontaneously.  She denies any fever, chills, or jaundice.  She has been on multiple medications and was switched to methotrexate earlier this year.  She is now going to have Humira added to her regimen to help treat her sarcoidosis.  She has had an extensive work-up including multiple EGDs and a right upper quadrant ultrasound, all of which have been unremarkable.  She did have a hepatobiliary scan done recently which showed the gallbladder ejection fraction of 19%.  She did not have biliary symptoms with drinking Ensure.  The test did not bring back her symptoms.  Her GI symptoms have not been relieved with any particular medication.  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  Her pulmonary care seems to be switching to [facility]. Past Medical History:  Diagnosis Date   Cancer [facility])    Skin   Complication of anesthesia    woke up during endoscopy and colonoscopy   Depression    Diabetes ([place])    GERD (gastroesophageal reflux disease)    Hemorrhoids    History of esophageal stricture    2011  peptic w/  dilatation   History of gastritis    2011   History of melanoma excision    2015  left leg/   12-09-2015 back    Hypertension    Microhematuria    Migraines    Nephrolithiasis    left side non-obstructive    Right ureteral stone    Sarcoidosis 07/24/2021   Urgency of urination    Wears contact lenses     Past Surgical History:  Procedure Laterality Date   ABDOMINAL HYSTERECTOMY     BALLOON DILATION N/A 05/03/2022   Procedure: BALLOON DILATION;  Surgeon: Eloise Harman, DO;   Location: [facility];  Service: Endoscopy;  Laterality: N/A;   BIOPSY  11/16/2021   Procedure: BIOPSY;  Surgeon: Eloise Harman, DO;  Location: [facility];  Service: Endoscopy;;   BIOPSY  05/03/2022   Procedure: BIOPSY;  Surgeon: Eloise Harman, DO;  Location: [facility];  Service: Endoscopy;;   CARPAL TUNNEL RELEASE Right 10/29/2013   COLONOSCOPY N/A 08/27/2015   Procedure: COLONOSCOPY;  Surgeon: Danie Binder, MD;  Location: [facility];  Service: Endoscopy;  Laterality: N/A;  0830   COLONOSCOPY WITH PROPOFOL N/A 11/16/2021   Surgeon: Eloise Harman, DO;  Nonbleeding internal hemorrhoids, otherwise normal exam.  Recommended 10-year screening colonoscopy.   CYST EXCISION  10/25/2010   right hand   CYSTOSCOPY W/ URETERAL STENT PLACEMENT Right 12/22/2015   Procedure: CYSTOSCOPY WITH RETROGRADE PYELOGRAM/URETERAL STENT PLACEMENT;  Surgeon: Nickie Retort, MD;  Location: [facility];  Service: Urology;  Laterality: Right;   CYSTOSCOPY WITH RETROGRADE PYELOGRAM, URETEROSCOPY AND STENT PLACEMENT Right 01/05/2016   Procedure: CYSTOSCOPY WITH RETROGRADE PYELOGRAM, URETEROSCOPY AND STENT REPLACEMENT;  Surgeon: Nickie Retort, MD;  Location[facility];  Service: Urology;  Laterality: Right;   DILATION AND CURETTAGE OF UTERUS  03/12/2009   w/  Suction   double balloon enteroscopy  Dr. Arsenio Loader at [facility]: no erosions, no evidence of Crohn's disease, path without Crohn's.    EGD with push enteroscopy  02/12/2010    patent distal peptic stricture with diffuse antral erythema, normal D1 and D2    ESOPHAGOGASTRODUODENOSCOPY (EGD) WITH PROPOFOL N/A 11/16/2021   Surgeon: Eloise Harman, DO;   Gastritis biopsied, otherwise normal exam.  Pathology with mild chronic gastritis, negative for H. pylori.   ESOPHAGOGASTRODUODENOSCOPY (EGD) WITH PROPOFOL N/A 05/03/2022   Surgeon: Eloise Harman, DO;  Normal esophagus s/p empiric dilation, gastritis biopsied.   Pathology with mild chronic, focally active gastritis, negative for H. pylori.   HAND SURGERY     LAPAROSCOPIC ASSISTED VAGINAL HYSTERECTOMY  10/26/2014   w/  Bilateral Salpingoophorectomy   STONE EXTRACTION WITH BASKET Right 01/05/2016   Procedure: STONE EXTRACTION WITH BASKET;  Surgeon: Nickie Retort, MD;  Location: [facility];  Service: Urology;  Laterality: Right;   TONSILLECTOMY  1998  approx    Family History  Problem Relation Age of Onset   Hypertension Mother    Thyroid disease Mother    Diabetes Mother    Hyperlipidemia Mother    Stroke Mother    Heart disease Mother    Hyperlipidemia Father    Hypertension Father    Heart attack Father    Heart failure Father    Colon cancer Neg Hx     Current Outpatient Medications on File Prior to Visit  Medication Sig Dispense Refill   albuterol (VENTOLIN HFA) 108 (90 Base) MCG/ACT inhaler Inhale 2 puffs into the lungs every 6 (six) hours as needed for wheezing or shortness of breath. 8 g 6   BD PEN NEEDLE NANO 2ND GEN 32G X 4 MM MISC USE AS DIRECTED 100 each 0   busPIRone (BUSPAR) 5 MG tablet TAKE 1 TABLET BY MOUTH THREE TIMES DAILY (Patient taking differently: Take 5 mg by mouth 3 (three) times daily as needed (anxiety).) 45 tablet 2   calcium carbonate (TUMS - DOSED IN MG ELEMENTAL CALCIUM) 500 MG chewable tablet Chew 1,500 mg by mouth 2 (two) times daily as needed for indigestion or heartburn.     Carboxymethylcellul-Glycerin (LUBRICATING EYE DROPS OP) Place 1 drop into both eyes daily as needed (dry eyes).     Chromium 1000 MCG TABS Take 1,000 mcg by mouth daily.     CINNAMON PO Take 2,000 mg by mouth daily.     Continuous Blood Gluc Receiver (FREESTYLE LIBRE 2 READER) DEVI Use as directed to check blood sugars. 1 each 0   Continuous Blood Gluc Sensor (FREESTYLE LIBRE 2 SENSOR) MISC Use sensor as directed. Change every 10-14 days. 4 each 3   Cyanocobalamin (B-12) 2000 MCG TABS Take 2,000 mcg by mouth daily.      dicyclomine (BENTYL) 10 MG capsule Take 1 capsule (10 mg total) by mouth up to 4 (four) times daily (before meals and at bedtime) for diarrhea and abdominal cramping. 90 capsule 0   EPINEPHrine 0.3 mg/0.3 mL IJ SOAJ injection Inject 0.3 mg into the muscle as needed for anaphylaxis. 1 each 0   folic acid (FOLVITE) 1 MG tablet Take 1 tablet (1 mg total) by mouth in the morning and at bedtime. 90 tablet 0   hydrochlorothiazide (HYDRODIURIL) 25 MG tablet Take 1 tablet (25 mg total) by mouth daily. 90 tablet 1   [START ON 08/14/2022] HYDROcodone-acetaminophen (NORCO/VICODIN) 5-325 MG tablet Take 1 tablet by mouth 2 (two) times daily. For moderate to severe pain. [place]  tablet 0   lansoprazole (PREVACID) 30 MG capsule Take 1 capsule (30 mg total) by mouth 2 (two) times daily before a meal. 60 capsule 3   losartan (COZAAR) 100 MG tablet Take 1 tablet by mouth once daily 90 tablet 0   metFORMIN (GLUCOPHAGE) 850 MG tablet TAKE 1 TABLET BY MOUTH TWICE DAILY WITH A MEAL 180 tablet 0   methotrexate (RHEUMATREX) 2.5 MG tablet Take 8 tablets (20 mg total) by mouth once a week. 32 tablet 4   PARoxetine (PAXIL) 40 MG tablet TAKE 1 TABLET BY MOUTH ONCE DAILY IN THE MORNING 90 tablet 0   Probiotic Product (PROBIOTIC PO) Take 1 capsule by mouth daily.     prochlorperazine (COMPAZINE) 10 MG tablet Take 1 tablet (10 mg total) by mouth every 6 (six) hours as needed for nausea or vomiting. 30 tablet 2   rizatriptan (MAXALT-MLT) 10 MG disintegrating tablet Take 1 tablet (10 mg total) by mouth as needed for migraine. May repeat in 2 hours if needed. Max 2 per 24 hours 10 tablet 1   scopolamine (TRANSDERM-SCOP) 1 MG/3DAYS Place 1 patch (1.5 mg total) onto the skin every 3 (three) days. 10 patch 12   SEMGLEE, YFGN, 100 UNIT/ML Pen INJECT 30 UNITS SUBCUTANEOUSLY EVERY NIGHT 15 mL 3   sucralfate (CARAFATE) 1 g tablet Take 1 tablet (1 g total) by mouth 4 (four) times daily -  with meals and at bedtime. 120 tablet 1   traZODone  (DESYREL) 150 MG tablet Take 1 tablet (150 mg total) by mouth at bedtime as needed for sleep. 90 tablet 3   valACYclovir (VALTREX) 500 MG tablet Take 500 mg by mouth 2 (two) times daily as needed (outbreaks).     No current facility-administered medications on file prior to visit.    Allergies  Allergen Reactions   Topamax [Topiramate] Other (See Comments)    Developed kidney stones   Fluorouracil Swelling    Burning skin   Sulfa Antibiotics Other (See Comments)    Burning sensation, flushing to skin    Social History   Substance and Sexual Activity  Alcohol Use Not Currently    Social History   Tobacco Use  Smoking Status Never   Passive exposure: Past  Smokeless Tobacco Never    Review of Systems  Constitutional:  Positive for malaise/fatigue.  HENT: Negative.    Eyes: Negative.   Respiratory:  Positive for shortness of breath.   Cardiovascular: Negative.   Gastrointestinal:  Positive for abdominal pain, nausea and vomiting.  Genitourinary: Negative.   Skin: Negative.   Neurological: Negative.   Endo/Heme/Allergies: Negative.   Psychiatric/Behavioral: Negative.      Objective   Vitals:   08/11/22 1506  BP: 99/68  Pulse: 93  Resp: 14  Temp: 98.2 F (36.8 C)  SpO2: 95%    Physical Exam Vitals reviewed.  Constitutional:      Appearance: Normal appearance. She is not ill-appearing.  HENT:     Head: Normocephalic and atraumatic.  Eyes:     General: No scleral icterus. Cardiovascular:     Rate and Rhythm: Normal rate and regular rhythm.     Heart sounds: Normal heart sounds. No murmur heard.    No friction rub. No gallop.  Pulmonary:     Effort: Pulmonary effort is normal. No respiratory distress.     Breath sounds: Normal breath sounds. No stridor. No wheezing, rhonchi or rales.  Abdominal:     General: Bowel sounds are normal. There is no  distension.     Palpations: Abdomen is soft. There is no mass.     Tenderness: There is no abdominal  tenderness. There is no guarding.     Hernia: No hernia is present.  Skin:    General: Skin is warm and dry.  Neurological:     Mental Status: She is alert and oriented to person, place, and time.    Recent pulmonary office notes reviewed, labs and HIDA scan report reviewed Assessment  Nausea and upper abdominal GI symptoms of unknown etiology.  She does have a hepatobiliary scan which was abnormal, but she did not have reproducible symptoms.  She multiple comorbidities including her sarcoidosis.  Her pulmonary sarcoid noises makes her at increased risk for any surgical intervention that would require endotracheal intubation.  I think her symptoms are multifactorial in nature.  I am wary of proceeding with a cholecystectomy as her symptoms were not reproducible.  I did explain this to the patient who understands and agrees. Plan  She will continue to monitor her symptoms.  She does have further work-up pending.  Should she require surgical intervention, this may need to be done at a tertiary care center in coordination with pulmonology.  Follow-up here as needed.

## 2022-08-12 ENCOUNTER — Ambulatory Visit (HOSPITAL_COMMUNITY): Payer: Medicaid Other | Admitting: Hematology

## 2022-08-13 ENCOUNTER — Ambulatory Visit (HOSPITAL_COMMUNITY)
Admission: RE | Admit: 2022-08-13 | Discharge: 2022-08-13 | Disposition: A | Discharge status: Home / Self Care | Payer: Medicaid Other | Source: Ambulatory Visit | Attending: Gastroenterology | Admitting: Gastroenterology

## 2022-08-13 ENCOUNTER — Other Ambulatory Visit: Payer: Self-pay | Admitting: Gastroenterology

## 2022-08-13 DIAGNOSIS — R634 Abnormal weight loss: Secondary | ICD-10-CM | POA: Diagnosis not present

## 2022-08-13 DIAGNOSIS — K8689 Other specified diseases of pancreas: Secondary | ICD-10-CM | POA: Diagnosis not present

## 2022-08-13 DIAGNOSIS — D1803 Hemangioma of intra-abdominal structures: Secondary | ICD-10-CM | POA: Diagnosis not present

## 2022-08-13 DIAGNOSIS — R9389 Abnormal findings on diagnostic imaging of other specified body structures: Secondary | ICD-10-CM | POA: Insufficient documentation

## 2022-08-13 DIAGNOSIS — K760 Fatty (change of) liver, not elsewhere classified: Secondary | ICD-10-CM | POA: Diagnosis not present

## 2022-08-13 MED ORDER — GADOBUTROL 1 MMOL/ML IV SOLN
8.0000 mL | Freq: Once | INTRAVENOUS | Status: AC | PRN
  Administered 2022-08-13: 8 mL via INTRAVENOUS

## 2022-08-18 ENCOUNTER — Inpatient Hospital Stay: Payer: Medicaid Other | Attending: Hematology

## 2022-08-18 ENCOUNTER — Ambulatory Visit (INDEPENDENT_AMBULATORY_CARE_PROVIDER_SITE_OTHER): Payer: Medicaid Other | Admitting: Internal Medicine

## 2022-08-18 ENCOUNTER — Encounter: Payer: Self-pay | Admitting: Internal Medicine

## 2022-08-18 VITALS — BP 119/82 | HR 79 | Temp 98.5°F | Ht 63.5 in | Wt 175.4 lb

## 2022-08-18 DIAGNOSIS — K582 Mixed irritable bowel syndrome: Secondary | ICD-10-CM | POA: Diagnosis not present

## 2022-08-18 DIAGNOSIS — R1011 Right upper quadrant pain: Secondary | ICD-10-CM

## 2022-08-18 DIAGNOSIS — D8689 Sarcoidosis of other sites: Secondary | ICD-10-CM | POA: Diagnosis not present

## 2022-08-18 DIAGNOSIS — G8929 Other chronic pain: Secondary | ICD-10-CM

## 2022-08-18 DIAGNOSIS — Z85820 Personal history of malignant melanoma of skin: Secondary | ICD-10-CM | POA: Insufficient documentation

## 2022-08-18 DIAGNOSIS — R197 Diarrhea, unspecified: Secondary | ICD-10-CM

## 2022-08-18 DIAGNOSIS — D869 Sarcoidosis, unspecified: Secondary | ICD-10-CM

## 2022-08-18 DIAGNOSIS — R1013 Epigastric pain: Secondary | ICD-10-CM | POA: Diagnosis not present

## 2022-08-18 DIAGNOSIS — K219 Gastro-esophageal reflux disease without esophagitis: Secondary | ICD-10-CM | POA: Diagnosis not present

## 2022-08-18 LAB — COMPREHENSIVE METABOLIC PANEL
ALT: 33 U/L (ref 0–44)
AST: 30 U/L (ref 15–41)
Albumin: 3.8 g/dL (ref 3.5–5.0)
Alkaline Phosphatase: 70 U/L (ref 38–126)
Anion gap: 11 (ref 5–15)
BUN: 11 mg/dL (ref 6–20)
CO2: 23 mmol/L (ref 22–32)
Calcium: 9.2 mg/dL (ref 8.9–10.3)
Chloride: 106 mmol/L (ref 98–111)
Creatinine, Ser: 0.65 mg/dL (ref 0.44–1.00)
GFR, Estimated: >60 mL/min (ref >60)
Glucose, Bld: 143 mg/dL — ABNORMAL HIGH (ref 70–99)
Potassium: 3.5 mmol/L (ref 3.5–5.1)
Sodium: 140 mmol/L (ref 135–145)
Total Bilirubin: 0.6 mg/dL (ref 0.3–1.2)
Total Protein: 6.5 g/dL (ref 6.5–8.1)

## 2022-08-18 LAB — CBC WITH DIFFERENTIAL/PLATELET
Abs Immature Granulocytes: 0.01 10*3/uL (ref 0.00–0.07)
Basophils Absolute: 0 10*3/uL (ref 0.0–0.1)
Basophils Relative: 1 %
Eosinophils Absolute: 0.2 10*3/uL (ref 0.0–0.5)
Eosinophils Relative: 3 %
HCT: 38 % (ref 36.0–46.0)
Hemoglobin: 13.1 g/dL (ref 12.0–15.0)
Immature Granulocytes: 0 %
Lymphocytes Relative: 28 %
Lymphs Abs: 1.7 10*3/uL (ref 0.7–4.0)
MCH: 30.8 pg (ref 26.0–34.0)
MCHC: 34.5 g/dL (ref 30.0–36.0)
MCV: 89.2 fL (ref 80.0–100.0)
Monocytes Absolute: 0.3 10*3/uL (ref 0.1–1.0)
Monocytes Relative: 5 %
Neutro Abs: 3.8 10*3/uL (ref 1.7–7.7)
Neutrophils Relative %: 63 %
Platelets: 313 10*3/uL (ref 150–400)
RBC: 4.26 MIL/uL (ref 3.87–5.11)
RDW: 14.3 % (ref 11.5–15.5)
WBC: 6 10*3/uL (ref 4.0–10.5)
nRBC: 0 % (ref 0.0–0.2)

## 2022-08-18 LAB — LACTATE DEHYDROGENASE: LDH: 131 U/L (ref 98–192)

## 2022-08-18 NOTE — Patient Instructions (Addendum)
I am going to order stool studies today at [facility] to rule out infectious cause of your worsening diarrhea as well as to check a pancreatic elastase.  We will call you with these results.  If these are normal, we will refer you to a surgeon elsewhere to discuss having your gallbladder removed.  Otherwise follow-up with me in 2 to 3 months.  It was very nice seeing you again today.  Dr. Abbey Chatters

## 2022-08-18 NOTE — Progress Notes (Signed)
Referring Provider: Coral Spikes, DO Primary Care Physician:  Coral Spikes, DO Primary GI:  Dr. Abbey Chatters  Chief Complaint  Patient presents with   Abdominal Pain    4 week follow up on abdominal pain, pain, nausea, diarrhea, vomiting are about the same. Now started having dark stools about one week ago. Having some dizziness.     HPI:   Courtney Hale is a 56 y.o. female who presents to the clinic today for follow up visit.  Complicated past medical history including sarcoidosis along with bone metastases currently on methotrexate, GERD, dysphagia, epigastric/right upper quadrant pain, nausea, weight loss, early satiety, IBS, biliary dyskinesia, pancreatic lesion.  Previous workup:  Celiac testing WNL.  MRI/MRCP 08/13/2022 hepatic steatosis, subcentimeter lesion in the pancreatic neck with recommended repeat CT in 6 months, small benign hepatic hemangioma.  HIDA scan 07/27/2022 with biliary dyskinesia.  CT abdomen pelvis with contrast 07/21/2022 with lesion in the pancreatic neck, stable hemangioma of the liver.  RUQ ultrasound 6/26 with hepatic steatosis, gallbladder and CBD normal.   Gastric emptying study 6/27 with normal exam.   EGD 05/03/2022: Normal esophagus s/p empiric dilation, gastritis biopsied.  Pathology with mild chronic, focally active gastritis, negative for H. pylori.  Colonoscopy 11/05/21: Nonbleeding internal hemorrhoids, otherwise normal exam.  Recommended 10-year screening colonoscopy.   EGD 11/05/21: Gastritis biopsied, otherwise normal exam.  Pathology with mild chronic gastritis, negative for H. pylori.  Currently following with [facility]ology with plans to start Humira in the near future.  States she was recently approved for this.  Met with Dr. Arnoldo Morale for biliary dyskinesia, abnormal HIDA scan.  Felt like patient will need to be seen at tertiary care center for cholecystectomy given her underlying lung disease.  Today, she states she continues to  have epigastric and right upper quadrant pain, mild to moderate severity, intermittent.  Also with daily nausea, states Compazine is helping.  Dysphagia improved.  Notes worsening diarrhea.  Notes 2-3 loose bowel movements daily.  States these have turned "black and oily."  No recent antibiotic use.    Past Medical History:  Diagnosis Date   Cancer [facility])    Skin   Complication of anesthesia    woke up during endoscopy and colonoscopy   Depression    Diabetes ([place])    GERD (gastroesophageal reflux disease)    Hemorrhoids    History of esophageal stricture    2011  peptic w/  dilatation   History of gastritis    2011   History of melanoma excision    2015  left leg/   12-09-2015 back    Hypertension    Microhematuria    Migraines    Nephrolithiasis    left side non-obstructive    Right ureteral stone    Sarcoidosis 07/24/2021   Urgency of urination    Wears contact lenses     Past Surgical History:  Procedure Laterality Date   ABDOMINAL HYSTERECTOMY     BALLOON DILATION N/A 05/03/2022   Procedure: BALLOON DILATION;  Surgeon: Eloise Harman, DO;  Location: [facility];  Service: Endoscopy;  Laterality: N/A;   BIOPSY  11/16/2021   Procedure: BIOPSY;  Surgeon: Eloise Harman, DO;  Location: [facility];  Service: Endoscopy;;   BIOPSY  05/03/2022   Procedure: BIOPSY;  Surgeon: Eloise Harman, DO;  Location: [facility];  Service: Endoscopy;;   CARPAL TUNNEL RELEASE Right 10/29/2013   COLONOSCOPY N/A 08/27/2015   Procedure: COLONOSCOPY;  Surgeon: Danie Binder, MD;  Location: [facility];  Service: Endoscopy;  Laterality: N/A;  0830   COLONOSCOPY WITH PROPOFOL N/A 11/16/2021   Surgeon: Eloise Harman, DO;  Nonbleeding internal hemorrhoids, otherwise normal exam.  Recommended 10-year screening colonoscopy.   CYST EXCISION  10/25/2010   right hand   CYSTOSCOPY W/ URETERAL STENT PLACEMENT Right 12/22/2015   Procedure: CYSTOSCOPY WITH RETROGRADE  PYELOGRAM/URETERAL STENT PLACEMENT;  Surgeon: Nickie Retort, MD;  Location: [facility];  Service: Urology;  Laterality: Right;   CYSTOSCOPY WITH RETROGRADE PYELOGRAM, URETEROSCOPY AND STENT PLACEMENT Right 01/05/2016   Procedure: CYSTOSCOPY WITH RETROGRADE PYELOGRAM, URETEROSCOPY AND STENT REPLACEMENT;  Surgeon: Nickie Retort, MD;  Location: [facility];  Service: Urology;  Laterality: Right;   DILATION AND CURETTAGE OF UTERUS  03/12/2009   w/  Suction   double balloon enteroscopy     Dr. Arsenio Loader at [facility]: no erosions, no evidence of Crohn's disease, path without Crohn's.    EGD with push enteroscopy  02/12/2010    patent distal peptic stricture with diffuse antral erythema, normal D1 and D2    ESOPHAGOGASTRODUODENOSCOPY (EGD) WITH PROPOFOL N/A 11/16/2021   Surgeon: Eloise Harman, DO;   Gastritis biopsied, otherwise normal exam.  Pathology with mild chronic gastritis, negative for H. pylori.   ESOPHAGOGASTRODUODENOSCOPY (EGD) WITH PROPOFOL N/A 05/03/2022   Surgeon: Eloise Harman, DO;  Normal esophagus s/p empiric dilation, gastritis biopsied.  Pathology with mild chronic, focally active gastritis, negative for H. pylori.   HAND SURGERY     LAPAROSCOPIC ASSISTED VAGINAL HYSTERECTOMY  10/26/2014   w/  Bilateral Salpingoophorectomy   STONE EXTRACTION WITH BASKET Right 01/05/2016   Procedure: STONE EXTRACTION WITH BASKET;  Surgeon: Nickie Retort, MD;  Location: [facility];  Service: Urology;  Laterality: Right;   TONSILLECTOMY  1998  approx    Current Outpatient Medications  Medication Sig Dispense Refill   albuterol (VENTOLIN HFA) 108 (90 Base) MCG/ACT inhaler Inhale 2 puffs into the lungs every 6 (six) hours as needed for wheezing or shortness of breath. 8 g 6   BD PEN NEEDLE NANO 2ND GEN 32G X 4 MM MISC USE AS DIRECTED 100 each 0   busPIRone (BUSPAR) 5 MG tablet TAKE 1 TABLET BY MOUTH THREE TIMES DAILY (Patient taking differently: Take 5 mg  by mouth 3 (three) times daily as needed (anxiety).) 45 tablet 2   calcium carbonate (TUMS - DOSED IN MG ELEMENTAL CALCIUM) 500 MG chewable tablet Chew 1,500 mg by mouth 2 (two) times daily as needed for indigestion or heartburn.     Carboxymethylcellul-Glycerin (LUBRICATING EYE DROPS OP) Place 1 drop into both eyes daily as needed (dry eyes).     Chromium 1000 MCG TABS Take 1,000 mcg by mouth daily.     CINNAMON PO Take 2,000 mg by mouth daily.     Continuous Blood Gluc Receiver (FREESTYLE LIBRE 2 READER) DEVI Use as directed to check blood sugars. 1 each 0   Continuous Blood Gluc Sensor (FREESTYLE LIBRE 2 SENSOR) MISC Use sensor as directed. Change every 10-14 days. 4 each 3   Cyanocobalamin (B-12) 2000 MCG TABS Take 2,000 mcg by mouth daily.     dicyclomine (BENTYL) 10 MG capsule Take 1 capsule (10 mg total) by mouth up to 4 (four) times daily (before meals and at bedtime) for diarrhea and abdominal cramping. 90 capsule 0   EPINEPHrine 0.3 mg/0.3 mL IJ SOAJ injection Inject 0.3 mg into the muscle as  needed for anaphylaxis. 1 each 0   folic acid (FOLVITE) 1 MG tablet Take 1 tablet (1 mg total) by mouth in the morning and at bedtime. 90 tablet 0   hydrochlorothiazide (HYDRODIURIL) 25 MG tablet Take 1 tablet (25 mg total) by mouth daily. 90 tablet 1   HYDROcodone-acetaminophen (NORCO/VICODIN) 5-325 MG tablet Take 1 tablet by mouth 2 (two) times daily. For moderate to severe pain. 60 tablet 0   lansoprazole (PREVACID) 30 MG capsule Take 1 capsule (30 mg total) by mouth 2 (two) times daily before a meal. 60 capsule 3   losartan (COZAAR) 100 MG tablet Take 1 tablet by mouth once daily 90 tablet 0   metFORMIN (GLUCOPHAGE) 850 MG tablet TAKE 1 TABLET BY MOUTH TWICE DAILY WITH A MEAL 180 tablet 0   methotrexate (RHEUMATREX) 2.5 MG tablet Take 8 tablets (20 mg total) by mouth once a week. 32 tablet 4   PARoxetine (PAXIL) 40 MG tablet TAKE 1 TABLET BY MOUTH ONCE DAILY IN THE MORNING 90 tablet 0   Probiotic  Product (PROBIOTIC PO) Take 1 capsule by mouth daily.     prochlorperazine (COMPAZINE) 10 MG tablet Take 1 tablet (10 mg total) by mouth every 6 (six) hours as needed for nausea or vomiting. 30 tablet 2   rizatriptan (MAXALT-MLT) 10 MG disintegrating tablet Take 1 tablet (10 mg total) by mouth as needed for migraine. May repeat in 2 hours if needed. Max 2 per 24 hours 10 tablet 1   scopolamine (TRANSDERM-SCOP) 1 MG/3DAYS Place 1 patch (1.5 mg total) onto the skin every 3 (three) days. 10 patch 12   SEMGLEE, YFGN, 100 UNIT/ML Pen INJECT 30 UNITS SUBCUTANEOUSLY EVERY NIGHT 15 mL 3   sucralfate (CARAFATE) 1 g tablet Take 1 tablet (1 g total) by mouth 4 (four) times daily -  with meals and at bedtime. 120 tablet 1   traZODone (DESYREL) 150 MG tablet Take 1 tablet (150 mg total) by mouth at bedtime as needed for sleep. 90 tablet 3   valACYclovir (VALTREX) 500 MG tablet Take 500 mg by mouth 2 (two) times daily as needed (outbreaks).     No current facility-administered medications for this visit.    Allergies as of 08/18/2022 - Review Complete 08/18/2022  Allergen Reaction Noted   Topamax [topiramate] Other (See Comments) 12/18/2016   Fluorouracil Swelling 11/05/2021   Sulfa antibiotics Other (See Comments) 12/05/2012    Family History  Problem Relation Age of Onset   Hypertension Mother    Thyroid disease Mother    Diabetes Mother    Hyperlipidemia Mother    Stroke Mother    Heart disease Mother    Hyperlipidemia Father    Hypertension Father    Heart attack Father    Heart failure Father    Colon cancer Neg Hx     Social History   Socioeconomic History   Marital status: Single    Spouse name: Not on file   Number of children: Not on file   Years of education: Not on file   Highest education level: Not on file  Occupational History   Not on file  Tobacco Use   Smoking status: Never    Passive exposure: Past   Smokeless tobacco: Never  Vaping Use   Vaping Use: Never used   Substance and Sexual Activity   Alcohol use: Not Currently   Drug use: No   Sexual activity: Not on file  Other Topics Concern   Not on file  Social History Narrative   Not on file   Social Determinants of Health   Financial Resource Strain: Low Risk  (06/30/2021)   Overall Financial Resource Strain (CARDIA)    Difficulty of Paying Living Expenses: Not hard at all  Food Insecurity: No Food Insecurity (06/30/2021)   Hunger Vital Sign    Worried About Running Out of Food in the Last Year: Never true    [place] in the Last Year: Never true  Transportation Needs: No Transportation Needs (06/30/2021)   PRAPARE - Hydrologist (Medical): No    Lack of Transportation (Non-Medical): No  Physical Activity: Inactive (06/30/2021)   Exercise Vital Sign    Days of Exercise per Week: 0 days    Minutes of Exercise per Session: 0 min  Stress: Stress Concern Present (06/30/2021)   [place]    Feeling of Stress : Very much  Social Connections: Socially Isolated (06/30/2021)   Social Connection and Isolation Panel [NHANES]    Frequency of Communication with Friends and Family: More than three times a week    Frequency of Social Gatherings with Friends and Family: More than three times a week    Attends Religious Services: Never    Marine scientist or Organizations: No    Attends Archivist Meetings: Never    Marital Status: Never married    Subjective: Review of Systems  Constitutional:  Negative for chills and fever.  HENT:  Negative for congestion and hearing loss.   Eyes:  Negative for blurred vision and double vision.  Respiratory:  Negative for cough and shortness of breath.   Cardiovascular:  Negative for chest pain and palpitations.  Gastrointestinal:  Positive for abdominal pain, diarrhea, heartburn and nausea. Negative for blood in stool, constipation, melena and vomiting.   Genitourinary:  Negative for dysuria and urgency.  Musculoskeletal:  Negative for joint pain and myalgias.  Skin:  Negative for itching and rash.  Neurological:  Negative for dizziness and headaches.  Psychiatric/Behavioral:  Negative for depression. The patient is not nervous/anxious.      Objective: BP 119/82 (BP Location: Right Arm, Patient Position: Sitting, Cuff Size: Large)   Pulse 79   Temp 98.5 F (36.9 C) (Oral)   Ht 5' 3.5" (1.613 m)   Wt 175 lb 6.4 oz (79.6 kg)   LMP 10/26/2014   BMI 30.58 kg/m  Physical Exam Constitutional:      Appearance: Normal appearance.  HENT:     Head: Normocephalic and atraumatic.  Eyes:     Extraocular Movements: Extraocular movements intact.     Conjunctiva/sclera: Conjunctivae normal.  Cardiovascular:     Rate and Rhythm: Normal rate and regular rhythm.  Pulmonary:     Effort: Pulmonary effort is normal.     Breath sounds: Normal breath sounds.  Abdominal:     General: Bowel sounds are normal.     Palpations: Abdomen is soft.  Musculoskeletal:        General: No swelling. Normal range of motion.     Cervical back: Normal range of motion and neck supple.  Skin:    General: Skin is warm and dry.     Coloration: Skin is not jaundiced.  Neurological:     General: No focal deficit present.     Mental Status: She is alert and oriented to person, place, and time.  Psychiatric:        Mood  and Affect: Mood normal.        Behavior: Behavior normal.      Assessment: *Chronic nausea *Right upper quadrant/epigastric pain *Biliary dyskinesia *Worsening diarrhea *Chronic GERD *Irritable bowel syndrome *Pancreatic neck lesion  Plan: Patient with numerous GI complaints for me today, complicated past medical history.  Likely combination of multiple GI issues.  Given her worsening diarrhea as well as reported dark/oily stools, will check GI pathogen panel today as well as fecal elastase.  Needs repeat CT abdomen pelvis pancreas  protocol in 6 months to monitor pancreatic neck lesion.  Continue on lansoprazole twice daily for chronic GERD.  Continue Compazine for nausea.  If stool studies unremarkable, consider referral to surgeon at tertiary care center to discuss cholecystectomy given her abnormal HIDA scan/biliary dyskinesia.  Otherwise follow-up with GI in 2 to 3 months.  08/18/2022 2:44 PM   Disclaimer: This note was dictated with voice recognition software. Similar sounding words can inadvertently be transcribed and may not be corrected upon review.

## 2022-08-19 ENCOUNTER — Other Ambulatory Visit: Payer: Self-pay | Admitting: Family Medicine

## 2022-08-20 ENCOUNTER — Telehealth: Payer: Self-pay | Admitting: *Deleted

## 2022-08-20 NOTE — Telephone Encounter (Signed)
PA for Hydrocodone Acet 5/'325mg'$  approved by insurance. Approval Good: 08/10/22-02/06/23  Patient notified via letter from insurance.

## 2022-08-24 DIAGNOSIS — R197 Diarrhea, unspecified: Secondary | ICD-10-CM | POA: Diagnosis not present

## 2022-08-25 ENCOUNTER — Inpatient Hospital Stay: Payer: Medicaid Other | Attending: Hematology | Admitting: Hematology

## 2022-08-25 VITALS — BP 134/97 | HR 75 | Temp 97.9°F | Resp 17 | Ht 63.5 in | Wt 170.6 lb

## 2022-08-25 DIAGNOSIS — Z90710 Acquired absence of both cervix and uterus: Secondary | ICD-10-CM | POA: Diagnosis not present

## 2022-08-25 DIAGNOSIS — Z8041 Family history of malignant neoplasm of ovary: Secondary | ICD-10-CM | POA: Insufficient documentation

## 2022-08-25 DIAGNOSIS — D869 Sarcoidosis, unspecified: Secondary | ICD-10-CM | POA: Diagnosis not present

## 2022-08-25 DIAGNOSIS — I10 Essential (primary) hypertension: Secondary | ICD-10-CM | POA: Insufficient documentation

## 2022-08-25 DIAGNOSIS — R591 Generalized enlarged lymph nodes: Secondary | ICD-10-CM | POA: Diagnosis not present

## 2022-08-25 DIAGNOSIS — D8689 Sarcoidosis of other sites: Secondary | ICD-10-CM | POA: Diagnosis not present

## 2022-08-25 DIAGNOSIS — E119 Type 2 diabetes mellitus without complications: Secondary | ICD-10-CM | POA: Insufficient documentation

## 2022-08-25 DIAGNOSIS — Z806 Family history of leukemia: Secondary | ICD-10-CM | POA: Diagnosis not present

## 2022-08-25 DIAGNOSIS — Z85820 Personal history of malignant melanoma of skin: Secondary | ICD-10-CM | POA: Insufficient documentation

## 2022-08-25 DIAGNOSIS — Z800 Family history of malignant neoplasm of digestive organs: Secondary | ICD-10-CM | POA: Diagnosis not present

## 2022-08-25 NOTE — Progress Notes (Signed)
[facility] [place], [place] [address]   CLINIC:  Medical Oncology/Hematology  PCP:  Coral Spikes, DO [address] [place] [address] [phone]   REASON FOR VISIT:  Follow-up for multiple bone lesions/lymphadenopathy/sarcoidosis  PRIOR THERAPY: none  NGS Results: not done  CURRENT THERAPY: Methotrexate  BRIEF ONCOLOGIC HISTORY:  Oncology History   No history exists.    CANCER STAGING:  Cancer Staging  No matching staging information was found for the patient.  INTERVAL HISTORY:  Courtney Hale, a 56 y.o. female, seen for follow-up of bone lesions and lymphadenopathy.  She was diagnosed with sarcoidosis.  Reports energy levels of 40 to 50%.  She is currently taking methotrexate and symptoms are not controlled.  She will start Humira at [facility] sarcoidosis clinic.  REVIEW OF SYSTEMS:  Review of Systems  Respiratory:  Positive for cough and shortness of breath.   Cardiovascular:  Positive for chest pain.  Gastrointestinal:  Positive for diarrhea and nausea.  Neurological:  Positive for dizziness, headaches and numbness (L foot).  Psychiatric/Behavioral:  The patient is nervous/anxious.   All other systems reviewed and are negative.   PAST MEDICAL/SURGICAL HISTORY:  Past Medical History:  Diagnosis Date   Cancer [facility])    Skin   Complication of anesthesia    woke up during endoscopy and colonoscopy   Depression    Diabetes ([place])    GERD (gastroesophageal reflux disease)    Hemorrhoids    History of esophageal stricture    2011  peptic w/  dilatation   History of gastritis    2011   History of melanoma excision    2015  left leg/   12-09-2015 back    Hypertension    Microhematuria    Migraines    Nephrolithiasis    left side non-obstructive    Right ureteral stone    Sarcoidosis 07/24/2021   Urgency of urination    Wears contact lenses    Past Surgical History:  Procedure Laterality Date   ABDOMINAL  HYSTERECTOMY     BALLOON DILATION N/A 05/03/2022   Procedure: BALLOON DILATION;  Surgeon: Eloise Harman, DO;  [facility];  Service: Endoscopy;  Laterality: N/A;   BIOPSY  11/16/2021   Procedure: BIOPSY;  Surgeon: Eloise Harman, DO;  [facility];  Service: Endoscopy;;   BIOPSY  05/03/2022   Procedure: BIOPSY;  Surgeon: Eloise Harman, DO;  [facility];  Service: Endoscopy;;   CARPAL TUNNEL RELEASE Right 10/29/2013   COLONOSCOPY N/A 08/27/2015   Procedure: COLONOSCOPY;  Surgeon: Danie Binder, MD;  [facility];  Service: Endoscopy;  Laterality: N/A;  0830   COLONOSCOPY WITH PROPOFOL N/A 11/16/2021   Surgeon: Eloise Harman, DO;  Nonbleeding internal hemorrhoids, otherwise normal exam.  Recommended 10-year screening colonoscopy.   CYST EXCISION  10/25/2010   right hand   CYSTOSCOPY W/ URETERAL STENT PLACEMENT Right 12/22/2015   Procedure: CYSTOSCOPY WITH RETROGRADE PYELOGRAM/URETERAL STENT PLACEMENT;  Surgeon: Nickie Retort, MD;  Location: [facility];  Service: Urology;  Laterality: Right;   CYSTOSCOPY WITH RETROGRADE PYELOGRAM, URETEROSCOPY AND STENT PLACEMENT Right 01/05/2016   Procedure: CYSTOSCOPY WITH RETROGRADE PYELOGRAM, URETEROSCOPY AND STENT REPLACEMENT;  Surgeon: Nickie Retort, MD;  Location: [facility];  Service: Urology;  Laterality: Right;   DILATION AND CURETTAGE OF UTERUS  03/12/2009   w/  Suction   double balloon enteroscopy  [facility] Arsenio Loader at [facility]: no erosions, no evidence of Crohn's disease, path without Crohn's.    EGD with push enteroscopy  02/12/2010    patent distal peptic stricture with diffuse antral erythema, normal D1 and D2    ESOPHAGOGASTRODUODENOSCOPY (EGD) WITH PROPOFOL N/A 11/16/2021   Surgeon: Eloise Harman, DO;   Gastritis biopsied, otherwise normal exam.  Pathology with mild chronic gastritis, negative for H. pylori.   ESOPHAGOGASTRODUODENOSCOPY (EGD) WITH PROPOFOL N/A  05/03/2022   Surgeon: Eloise Harman, DO;  Normal esophagus s/p empiric dilation, gastritis biopsied.  Pathology with mild chronic, focally active gastritis, negative for H. pylori.   HAND SURGERY     LAPAROSCOPIC ASSISTED VAGINAL HYSTERECTOMY  10/26/2014   w/  Bilateral Salpingoophorectomy   STONE EXTRACTION WITH BASKET Right 01/05/2016   Procedure: STONE EXTRACTION WITH BASKET;  Surgeon: Nickie Retort, MD;  Location: [facility];  Service: Urology;  Laterality: Right;   TONSILLECTOMY  1998  approx    SOCIAL HISTORY:  Social History   Socioeconomic History   Marital status: Single    Spouse name: Not on file   Number of children: Not on file   Years of education: Not on file   Highest education level: Not on file  Occupational History   Not on file  Tobacco Use   Smoking status: Never    Passive exposure: Past   Smokeless tobacco: Never  Vaping Use   Vaping Use: Never used  Substance and Sexual Activity   Alcohol use: Not Currently   Drug use: No   Sexual activity: Not on file  Other Topics Concern   Not on file  Social History Narrative   Not on file   Social Determinants of Health   Financial Resource Strain: Low Risk  (06/30/2021)   Overall Financial Resource Strain (CARDIA)    Difficulty of Paying Living Expenses: Not hard at all  Food Insecurity: No Food Insecurity (06/30/2021)   Hunger Vital Sign    Worried About Running Out of Food in the Last Year: Never true    [place] in the Last Year: Never true  Transportation Needs: No Transportation Needs (06/30/2021)   PRAPARE - Hydrologist (Medical): No    Lack of Transportation (Non-Medical): No  Physical Activity: Inactive (06/30/2021)   Exercise Vital Sign    Days of Exercise per Week: 0 days    Minutes of Exercise per Session: 0 min  Stress: Stress Concern Present (06/30/2021)   [place]     Feeling of Stress : Very much  Social Connections: Socially Isolated (06/30/2021)   Social Connection and Isolation Panel [NHANES]    Frequency of Communication with Friends and Family: More than three times a week    Frequency of Social Gatherings with Friends and Family: More than three times a week    Attends Religious Services: Never    Marine scientist or Organizations: No    Attends Archivist Meetings: Never    Marital Status: Never married  Intimate Partner Violence: Not At Risk (06/30/2021)   Humiliation, Afraid, Rape, and Kick questionnaire    Fear of Current or Ex-Partner: No    Emotionally Abused: No    Physically Abused: No    Sexually Abused: No    FAMILY HISTORY:  Family History  Problem Relation Age of Onset   Hypertension Mother    Thyroid disease Mother  Diabetes Mother    Hyperlipidemia Mother    Stroke Mother    Heart disease Mother    Hyperlipidemia Father    Hypertension Father    Heart attack Father    Heart failure Father    Colon cancer Neg Hx     CURRENT MEDICATIONS:  Current Outpatient Medications  Medication Sig Dispense Refill   albuterol (VENTOLIN HFA) 108 (90 Base) MCG/ACT inhaler Inhale 2 puffs into the lungs every 6 (six) hours as needed for wheezing or shortness of breath. 8 g 6   BD PEN NEEDLE NANO 2ND GEN 32G X 4 MM MISC USE AS DIRECTED 100 each 0   busPIRone (BUSPAR) 5 MG tablet TAKE 1 TABLET BY MOUTH THREE TIMES DAILY (Patient taking differently: Take 5 mg by mouth 3 (three) times daily as needed (anxiety).) 45 tablet 2   calcium carbonate (TUMS - DOSED IN MG ELEMENTAL CALCIUM) 500 MG chewable tablet Chew 1,500 mg by mouth 2 (two) times daily as needed for indigestion or heartburn.     Carboxymethylcellul-Glycerin (LUBRICATING EYE DROPS OP) Place 1 drop into both eyes daily as needed (dry eyes).     Chromium 1000 MCG TABS Take 1,000 mcg by mouth daily.     CINNAMON PO Take 2,000 mg by mouth daily.     Continuous Blood Gluc  Receiver (FREESTYLE LIBRE 2 READER) DEVI Use as directed to check blood sugars. 1 each 0   Continuous Blood Gluc Sensor (FREESTYLE LIBRE 2 SENSOR) MISC Use sensor as directed. Change every 10-14 days. 4 each 3   Cyanocobalamin (B-12) 2000 MCG TABS Take 2,000 mcg by mouth daily.     dicyclomine (BENTYL) 10 MG capsule Take 1 capsule (10 mg total) by mouth up to 4 (four) times daily (before meals and at bedtime) for diarrhea and abdominal cramping. 90 capsule 0   EPINEPHrine 0.3 mg/0.3 mL IJ SOAJ injection Inject 0.3 mg into the muscle as needed for anaphylaxis. 1 each 0   folic acid (FOLVITE) 1 MG tablet Take 1 tablet (1 mg total) by mouth in the morning and at bedtime. 90 tablet 0   hydrochlorothiazide (HYDRODIURIL) 25 MG tablet Take 1 tablet (25 mg total) by mouth daily. 90 tablet 1   HYDROcodone-acetaminophen (NORCO/VICODIN) 5-325 MG tablet Take 1 tablet by mouth 2 (two) times daily. For moderate to severe pain. 60 tablet 0   lansoprazole (PREVACID) 30 MG capsule Take 1 capsule (30 mg total) by mouth 2 (two) times daily before a meal. 60 capsule 3   losartan (COZAAR) 100 MG tablet Take 1 tablet by mouth once daily 90 tablet 0   metFORMIN (GLUCOPHAGE) 850 MG tablet TAKE 1 TABLET BY MOUTH TWICE DAILY WITH A MEAL 180 tablet 0   methotrexate (RHEUMATREX) 2.5 MG tablet Take 8 tablets (20 mg total) by mouth once a week. 32 tablet 4   PARoxetine (PAXIL) 40 MG tablet TAKE 1 TABLET BY MOUTH ONCE DAILY IN THE MORNING 90 tablet 0   Probiotic Product (PROBIOTIC PO) Take 1 capsule by mouth daily.     prochlorperazine (COMPAZINE) 10 MG tablet Take 1 tablet (10 mg total) by mouth every 6 (six) hours as needed for nausea or vomiting. 30 tablet 2   rizatriptan (MAXALT-MLT) 10 MG disintegrating tablet Take 1 tablet (10 mg total) by mouth as needed for migraine. May repeat in 2 hours if needed. Max 2 per 24 hours 10 tablet 1   scopolamine (TRANSDERM-SCOP) 1 MG/3DAYS Place 1 patch (1.5 mg total) onto the  skin every 3  (three) days. 10 patch 12   SEMGLEE, YFGN, 100 UNIT/ML Pen INJECT 30 UNITS SUBCUTANEOUSLY EVERY NIGHT 15 mL 3   sucralfate (CARAFATE) 1 g tablet Take 1 tablet (1 g total) by mouth 4 (four) times daily -  with meals and at bedtime. 120 tablet 1   traZODone (DESYREL) 150 MG tablet Take 1 tablet (150 mg total) by mouth at bedtime as needed for sleep. 90 tablet 3   valACYclovir (VALTREX) 500 MG tablet Take 500 mg by mouth 2 (two) times daily as needed (outbreaks).     No current facility-administered medications for this visit.    ALLERGIES:  Allergies  Allergen Reactions   Topamax [Topiramate] Other (See Comments)    Developed kidney stones   Fluorouracil Swelling    Burning skin   Sulfa Antibiotics Other (See Comments)    Burning sensation, flushing to skin    PHYSICAL EXAM:  Performance status (ECOG): 0 - Asymptomatic  Vitals:   08/25/22 1211  BP: (!) 134/97  Pulse: 75  Resp: 17  Temp: 97.9 F (36.6 C)  SpO2: 95%   Wt Readings from Last 3 Encounters:  08/25/22 170 lb 9.6 oz (77.4 kg)  08/18/22 175 lb 6.4 oz (79.6 kg)  08/11/22 176 lb (79.8 kg)   Physical Exam Vitals reviewed.  Constitutional:      Appearance: Normal appearance.  Cardiovascular:     Rate and Rhythm: Normal rate and regular rhythm.     Pulses: Normal pulses.     Heart sounds: Normal heart sounds.  Pulmonary:     Effort: Pulmonary effort is normal.     Breath sounds: Normal breath sounds.  Neurological:     General: No focal deficit present.     Mental Status: She is alert and oriented to person, place, and time.  Psychiatric:        Mood and Affect: Mood normal.        Behavior: Behavior normal.      LABORATORY DATA:  I have reviewed the labs as listed.     Latest Ref Rng & Units 08/18/2022   11:31 AM 07/15/2022    3:11 PM 06/04/2022    5:46 PM  CBC  WBC 4.0 - 10.5 K/uL 6.0  7.0  7.3   Hemoglobin 12.0 - 15.0 g/dL 13.1  13.5  12.5   Hematocrit 36.0 - 46.0 % 38.0  39.2  35.5   Platelets  150 - 400 K/uL 313  351  308       Latest Ref Rng & Units 08/18/2022   11:31 AM 07/15/2022    3:11 PM 06/04/2022    5:46 PM  CMP  Glucose 70 - 99 mg/dL 143  112  100   BUN 6 - 20 mg/dL '11  11  7   '$ Creatinine 0.44 - 1.00 mg/dL 0.65  0.66  0.70   Sodium 135 - 145 mmol/L 140  141  138   Potassium 3.5 - 5.1 mmol/L 3.5  4.0  3.2   Chloride 98 - 111 mmol/L 106  101  105   CO2 22 - 32 mmol/L '23  31  24   '$ Calcium 8.9 - 10.3 mg/dL 9.2  9.9  8.9   Total Protein 6.5 - 8.1 g/dL 6.5     Total Bilirubin 0.3 - 1.2 mg/dL 0.6     Alkaline Phos 38 - 126 U/L 70     AST 15 - 41 U/L 30     ALT  0 - 44 U/L 33       DIAGNOSTIC IMAGING:  I have independently reviewed the scans and discussed with the patient. MR ABDOMEN MRCP W WO CONTAST  Result Date: 08/13/2022 CLINICAL DATA:  Pancreatic lesion on recent CT.  Weight loss. EXAM: MRI ABDOMEN WITHOUT AND WITH CONTRAST (INCLUDING MRCP) TECHNIQUE: Multiplanar multisequence MR imaging of the abdomen was performed both before and after the administration of intravenous contrast. Heavily T2-weighted images of the biliary and pancreatic ducts were obtained, and three-dimensional MRCP images were rendered by post processing. CONTRAST:  81m GADAVIST GADOBUTROL 1 MMOL/ML IV SOLN COMPARISON:  CT on 07/21/2022 FINDINGS: Lower Chest: No acute findings. Hepatobiliary: Moderate diffuse hepatic steatosis is seen. A 2.2 cm benign hemangioma is seen in the posterior liver dome. No other hepatic masses identified. Gallbladder is unremarkable. No evidence of biliary ductal dilatation. Pancreas: A 7 mm lesion is seen in the pancreatic neck which shows hypoenhancement and mild T2 hyperintensity, but is difficult to characterize due to its small size. No other pancreatic lesions identified. No evidence of pancreatic ductal dilatation. Spleen: Within normal limits in size and appearance. Adrenals/Urinary Tract: No suspicious masses identified. No evidence of ureteral calculi or  hydronephrosis. Stomach/Bowel: A few small duodenal diverticula are noted. No evidence of obstruction, inflammatory process or abnormal fluid collections. Vascular/Lymphatic: No pathologically enlarged lymph nodes. No acute vascular findings. Reproductive:  No mass or other significant abnormality. Other:  None. Musculoskeletal:  No suspicious bone lesions identified. IMPRESSION: Sub-cm lesion in the pancreatic neck is difficult to characterize due to its small size. Recommend continued follow-up by abdomen CT with contrast in 6 months. Moderate diffuse hepatic steatosis. Small benign hepatic hemangioma. Electronically Signed   By: JMarlaine HindM.D.   On: 08/13/2022 15:29   MR 3D Recon At Scanner  Result Date: 08/13/2022 CLINICAL DATA:  Pancreatic lesion on recent CT.  Weight loss. EXAM: MRI ABDOMEN WITHOUT AND WITH CONTRAST (INCLUDING MRCP) TECHNIQUE: Multiplanar multisequence MR imaging of the abdomen was performed both before and after the administration of intravenous contrast. Heavily T2-weighted images of the biliary and pancreatic ducts were obtained, and three-dimensional MRCP images were rendered by post processing. CONTRAST:  883mGADAVIST GADOBUTROL 1 MMOL/ML IV SOLN COMPARISON:  CT on 07/21/2022 FINDINGS: Lower Chest: No acute findings. Hepatobiliary: Moderate diffuse hepatic steatosis is seen. A 2.2 cm benign hemangioma is seen in the posterior liver dome. No other hepatic masses identified. Gallbladder is unremarkable. No evidence of biliary ductal dilatation. Pancreas: A 7 mm lesion is seen in the pancreatic neck which shows hypoenhancement and mild T2 hyperintensity, but is difficult to characterize due to its small size. No other pancreatic lesions identified. No evidence of pancreatic ductal dilatation. Spleen: Within normal limits in size and appearance. Adrenals/Urinary Tract: No suspicious masses identified. No evidence of ureteral calculi or hydronephrosis. Stomach/Bowel: A few small  duodenal diverticula are noted. No evidence of obstruction, inflammatory process or abnormal fluid collections. Vascular/Lymphatic: No pathologically enlarged lymph nodes. No acute vascular findings. Reproductive:  No mass or other significant abnormality. Other:  None. Musculoskeletal:  No suspicious bone lesions identified. IMPRESSION: Sub-cm lesion in the pancreatic neck is difficult to characterize due to its small size. Recommend continued follow-up by abdomen CT with contrast in 6 months. Moderate diffuse hepatic steatosis. Small benign hepatic hemangioma. Electronically Signed   By: JoMarlaine Hind.D.   On: 08/13/2022 15:29   NM Hepato W/EjeCT Fract  Result Date: 07/27/2022 CLINICAL DATA:  Upper abdominal/epigastric pain with nausea  vomiting EXAM: NUCLEAR MEDICINE HEPATOBILIARY IMAGING WITH GALLBLADDER EF TECHNIQUE: Sequential images of the abdomen were obtained out to 60 minutes following intravenous administration of radiopharmaceutical. After oral ingestion of Ensure, gallbladder ejection fraction was determined. At 60 min, normal ejection fraction is greater than 33%. RADIOPHARMACEUTICALS:  5.1 mCi Tc-84m Choletec IV COMPARISON:  CT July 21, 2022. FINDINGS: Prompt uptake and biliary excretion of activity by the liver is seen. Gallbladder activity is visualized, consistent with patency of cystic duct. Biliary activity passes into small bowel, consistent with patent common bile duct. Calculated gallbladder ejection fraction is 19%. (Normal gallbladder ejection fraction with Ensure is greater than 33%.) Scintigraphic evidence of enterogastric biliary reflux. IMPRESSION: 1. Reduced gallbladder ejection fraction as can be seen with chronic cholecystitis/biliary dyskinesia. 2. Scintigraphic evidence of enterogastric biliary reflux. Electronically Signed   By: JDahlia BailiffM.D.   On: 07/27/2022 12:18     ASSESSMENT:  1.  Multiple bone lesions: - Presentation with left hip pain since February  2022. - Left hip mass noticed in May 2022, tender and painful. - Low back pain started since 25 May 2021. - Ultrasound of the left upper thigh soft tissue on 04/08/2021 was negative with differential including large occult subcutaneous lipoma. - MRI of the left femur with and without contrast on 05/14/2021-no soft tissue mass, fluid collection or hematoma at the site of clinical concern in the left lateral thigh.  Indeterminate bone lesion in the S1 vertebral body, right sacrum and left posterior intertrochanteric region. - MRI of the pelvis on 06/24/2021 showed multiple bone lesions in the pelvis, lower lumbar spine consistent with metastatic disease. 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 HJuel Burrowoffice. - She recently had a basal cell carcinoma of the nose and left temporal region resected. -PET scan on 07/10/2021 showed hypermetabolic lesions in the right iliac bone, right sacral ala, left frontal area.  Right lower paratracheal lymph node 10 mm, SUV 4.5.  Left lower paratracheal lymph node 10 mm SUV 5.5.  Symmetric moderate activity in the hilar lymph nodes difficult to assess on noncontrast CT.  No lung nodules. - Biopsy of the right posterior iliac bone on 07/21/2021 showed noncaseating granulomatous inflammation with no evidence of malignancy.  CD68 highlights the presence of granulomas. 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  2.  Social/family history: - She lives at home with her son.  Mom lives close by. 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  She is non-smoker. - Maternal aunt had throat cancer.  Another maternal aunt had bone cancer.  Another maternal aunt had pancreatic cancer.  Niece had ovarian cancer.  Paternal uncle had leukemia.   PLAN:  1.  Sarcoidosis involving lymphadenopathy in the chest and bones: - She is  [facility] - She is set to start Humira with the [facility] sarcoidosis clinic. - Recent MRI of the abdomen on 08/13/2022 showed 7 mm lesion in the pancreatic neck with hypoenhancement. - She has family history of pancreatic cancer in her maternal aunt. - She will have follow-up scan of the pancreas in 6 months, to be ordered by GI service. - Reviewed labs today which showed normal LFTs, LDH and CBC. - Recommend follow-up in 7 months with repeat labs.  Orders placed this encounter:  No orders of the defined types were placed in this encounter.    Derek Jack, MD [place] [phone]

## 2022-08-25 NOTE — Patient Instructions (Signed)
[place] at [facility] Discharge Instructions   You were seen and examined today by Dr. Delton Coombes.  He reviewed the results of your blood work which are normal/stable.   We will see you back in 7 months. We will repeat lab work at that time.    Thank you for choosing [place] at [facility] to provide your oncology and hematology care.  To afford each patient quality time with our provider, please arrive at least 15 minutes before your scheduled appointment time.   If you have a lab appointment with the [place] please come in thru the Main Entrance and check in at the main information desk.  You need to re-schedule your appointment should you arrive 10 or more minutes late.  We strive to give you quality time with our providers, and arriving late affects you and other patients whose appointments are after yours.  Also, if you no show three or more times for appointments you may be dismissed from the clinic at the providers discretion.     Again, thank you for choosing [facility].  Our hope is that these requests will decrease the amount of time that you wait before being seen by our physicians.       _____________________________________________________________  Should you have questions after your visit to [facility], please contact our office at [phone] and follow the prompts.  Our office hours are 8:00 a.m. and 4:30 p.m. Monday - Friday.  Please note that voicemails left after 4:00 p.m. may not be returned until the following business day.  We are closed weekends and major holidays.  You do have access to a nurse 24-7, just call the main number to the clinic [phone] and do not press any options, hold on the line and a nurse will answer the phone.    For prescription refill requests, have your pharmacy contact our office and allow 72 hours.    Due to Covid, you will need to wear a mask upon  entering the hospital. If you do not have a mask, a mask will be given to you at the Main Entrance upon arrival. For doctor visits, patients may have 1 support person age 69 or older with them. For treatment visits, patients can not have anyone with them due to social distancing guidelines and our immunocompromised population.

## 2022-08-26 ENCOUNTER — Telehealth: Payer: Self-pay

## 2022-08-26 NOTE — Progress Notes (Signed)
..   Medicaid Managed Care   Unsuccessful Outreach Note  08/26/2022 Name: NILLIE BARTOLOTTA MRN: [id number] DOB: 1966-01-05  Referred by: Coral Spikes, DO Reason for referral : Appointment   An unsuccessful telephone outreach was attempted today. The patient was referred to the case management team for assistance with care management and care coordination.   Follow Up Plan: The care management team will reach out to the patient again over the next 14 days.      [place]

## 2022-08-28 LAB — GI PROFILE, STOOL, PCR

## 2022-08-28 LAB — PANCREATIC ELASTASE, FECAL: Pancreatic Elastase, Fecal: 104 ug Elast./g — ABNORMAL LOW (ref >200)

## 2022-08-31 DIAGNOSIS — K219 Gastro-esophageal reflux disease without esophagitis: Secondary | ICD-10-CM | POA: Diagnosis not present

## 2022-08-31 DIAGNOSIS — Z23 Encounter for immunization: Secondary | ICD-10-CM | POA: Diagnosis not present

## 2022-08-31 DIAGNOSIS — R0602 Shortness of breath: Secondary | ICD-10-CM | POA: Diagnosis not present

## 2022-08-31 DIAGNOSIS — D84821 Immunodeficiency due to drugs: Secondary | ICD-10-CM | POA: Diagnosis not present

## 2022-08-31 DIAGNOSIS — Z79631 Long term (current) use of antimetabolite agent: Secondary | ICD-10-CM | POA: Diagnosis not present

## 2022-08-31 DIAGNOSIS — I10 Essential (primary) hypertension: Secondary | ICD-10-CM | POA: Diagnosis not present

## 2022-08-31 DIAGNOSIS — D869 Sarcoidosis, unspecified: Secondary | ICD-10-CM | POA: Diagnosis not present

## 2022-08-31 DIAGNOSIS — I272 Pulmonary hypertension, unspecified: Secondary | ICD-10-CM | POA: Diagnosis not present

## 2022-08-31 DIAGNOSIS — E119 Type 2 diabetes mellitus without complications: Secondary | ICD-10-CM | POA: Diagnosis not present

## 2022-08-31 DIAGNOSIS — D860 Sarcoidosis of lung: Secondary | ICD-10-CM | POA: Diagnosis not present

## 2022-08-31 DIAGNOSIS — Z87891 Personal history of nicotine dependence: Secondary | ICD-10-CM | POA: Diagnosis not present

## 2022-09-15 ENCOUNTER — Other Ambulatory Visit: Payer: Self-pay | Admitting: Gastroenterology

## 2022-09-15 ENCOUNTER — Telehealth: Payer: Self-pay | Admitting: *Deleted

## 2022-09-15 ENCOUNTER — Other Ambulatory Visit: Payer: Self-pay | Admitting: Family Medicine

## 2022-09-15 ENCOUNTER — Telehealth: Payer: Self-pay

## 2022-09-15 ENCOUNTER — Other Ambulatory Visit: Payer: Self-pay | Admitting: Pulmonary Disease

## 2022-09-15 DIAGNOSIS — D869 Sarcoidosis, unspecified: Secondary | ICD-10-CM

## 2022-09-15 DIAGNOSIS — Z79899 Other long term (current) drug therapy: Secondary | ICD-10-CM

## 2022-09-15 DIAGNOSIS — K219 Gastro-esophageal reflux disease without esophagitis: Secondary | ICD-10-CM

## 2022-09-15 DIAGNOSIS — D849 Immunodeficiency, unspecified: Secondary | ICD-10-CM

## 2022-09-15 DIAGNOSIS — F419 Anxiety disorder, unspecified: Secondary | ICD-10-CM

## 2022-09-15 DIAGNOSIS — R1011 Right upper quadrant pain: Secondary | ICD-10-CM

## 2022-09-15 NOTE — Telephone Encounter (Signed)
Pt states she went to see Dr. Arnoldo Morale but he advised her that he could not do her gallbladder surgery due to her being a high risk. Pt states where else can she be referred in [place] or [place]. Pt does not want to drive any further due to her mother driving. Please advise. Also advised pt it will be next week before Dr Abbey Chatters responds to the note.

## 2022-09-15 NOTE — Telephone Encounter (Signed)
Received call from patient [phone]~ telephone.   Reports that she has had initial visit with pulmonology:   Eldridge Scot, MD  [place], [place] [address]  Phone: [phone]  Fax: [phone]  States that pulmonology stated she was an acceptable risk for anesthesia for cholecystectomy.   Advised that Dr. Arnoldo Morale notes state that patient is high risk due to sarcoidosis and will need tertiary care center. Advised that patient is too complex for [facility].   Verbalized understanding.   States that she will contact GI for referral to [facility]. Advised to attempt to schedule with general surgeon at [facility] to be able to coordinate with pulmonology. States that she is unable to have her mother take her to [facility] as she is uncomfortable driving the distance.   Dr Arnoldo Morale and Dr. Abbey Chatters to be made aware.

## 2022-09-21 ENCOUNTER — Other Ambulatory Visit: Payer: Self-pay | Admitting: Gastroenterology

## 2022-09-21 DIAGNOSIS — K219 Gastro-esophageal reflux disease without esophagitis: Secondary | ICD-10-CM

## 2022-09-21 NOTE — Telephone Encounter (Signed)
Did the Zenpep samples help her symptoms at all?  Dr. Arnoldo Morale recommended referral to tertiary care center if she needs her gallbladder removed.  Closest would be [facility].  If she is agreeable to this and Zenpep did not help her symptoms then okay to make referral.

## 2022-09-22 NOTE — Addendum Note (Signed)
Addended by: Cheron Every on: 09/22/2022 04:40 PM   Modules accepted: Orders

## 2022-09-22 NOTE — Telephone Encounter (Signed)
Spoke with the pt and was advised that the Zenpep is helping. She agrees to be referred to [facility] for gallbladder removal. I also gave the pt 6 more sample boxes of Zenpep. (12 caps each box).   Mindy / Tammy Pt needs referral to [facility] for gallbladder removal.

## 2022-09-22 NOTE — Telephone Encounter (Signed)
Referral sent to [facility] surgery

## 2022-09-24 IMAGING — DX DG FEMUR 2+V*R*
4 series · 4 of 4 positions shown · non-contrast
Comparison: None.

CLINICAL DATA: Patient with complaint of bilateral hip pain.
Patient with complaint of right thigh pain. History of bony lesions
based on biopsy secondary to sarcoidosis. Not neoplastic.

EXAM:
RIGHT FEMUR 2 VIEWS

[femur ap (1 of 2)]
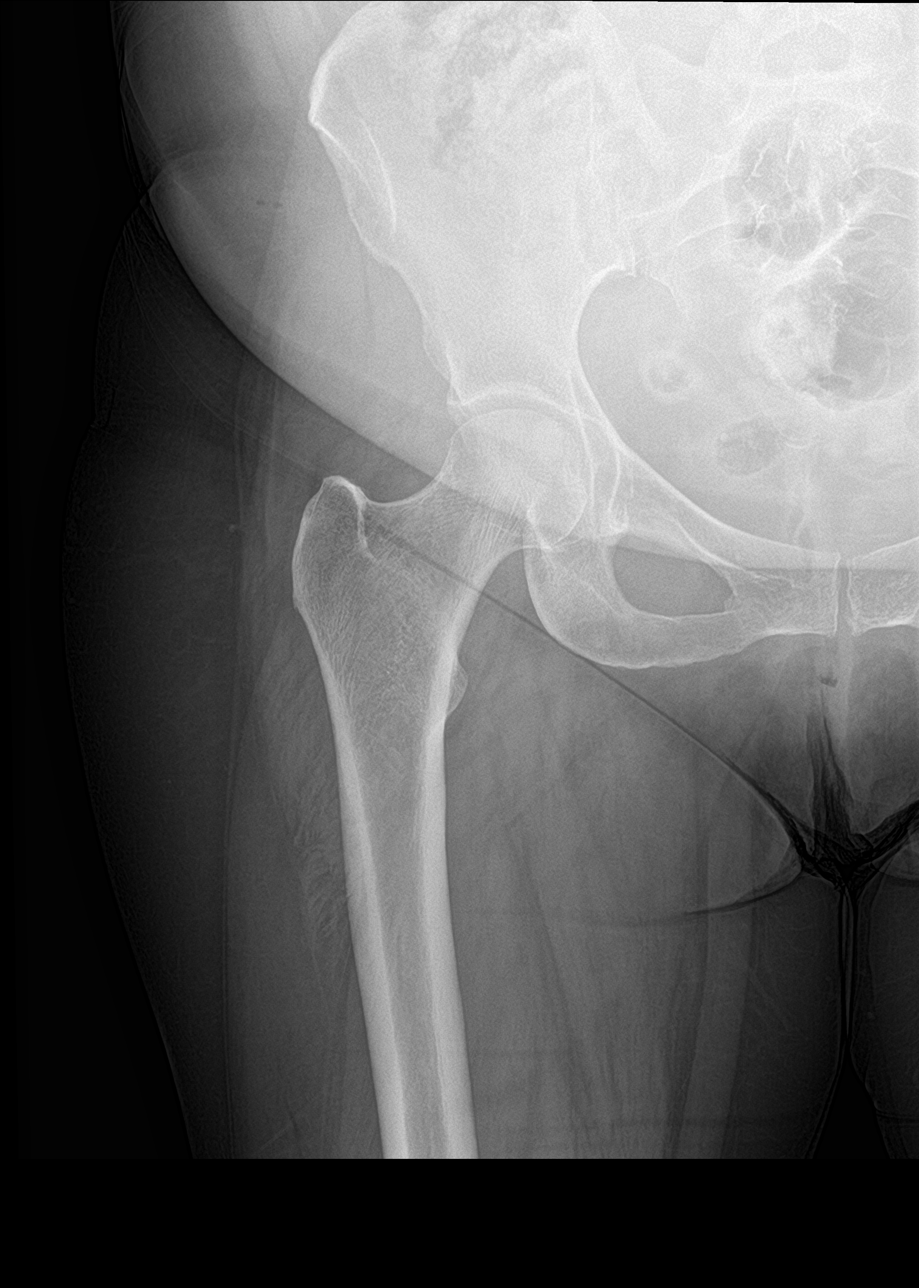

[femur ap (2 of 2)]
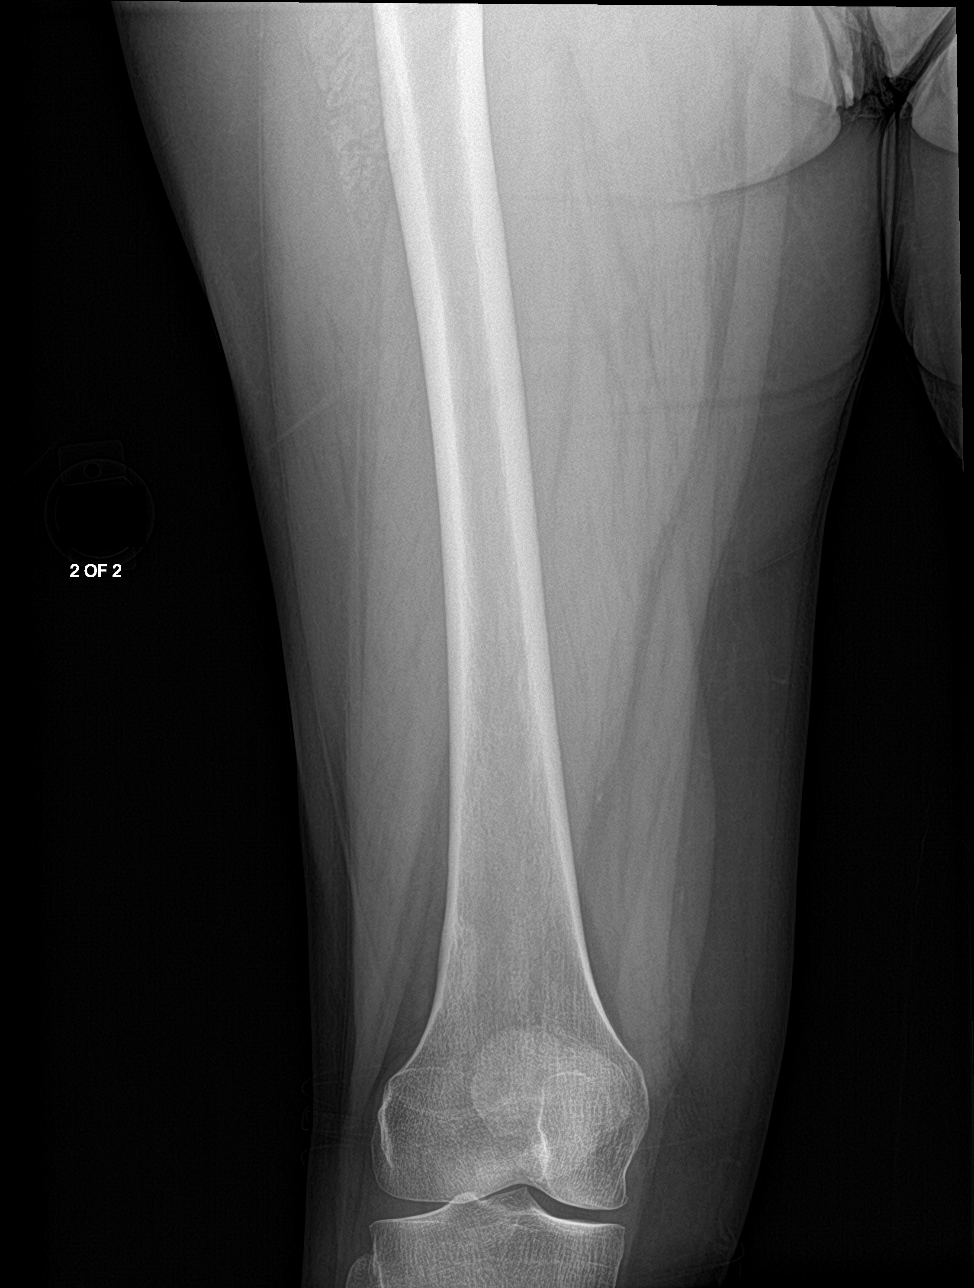

[femur lat (1 of 2)]
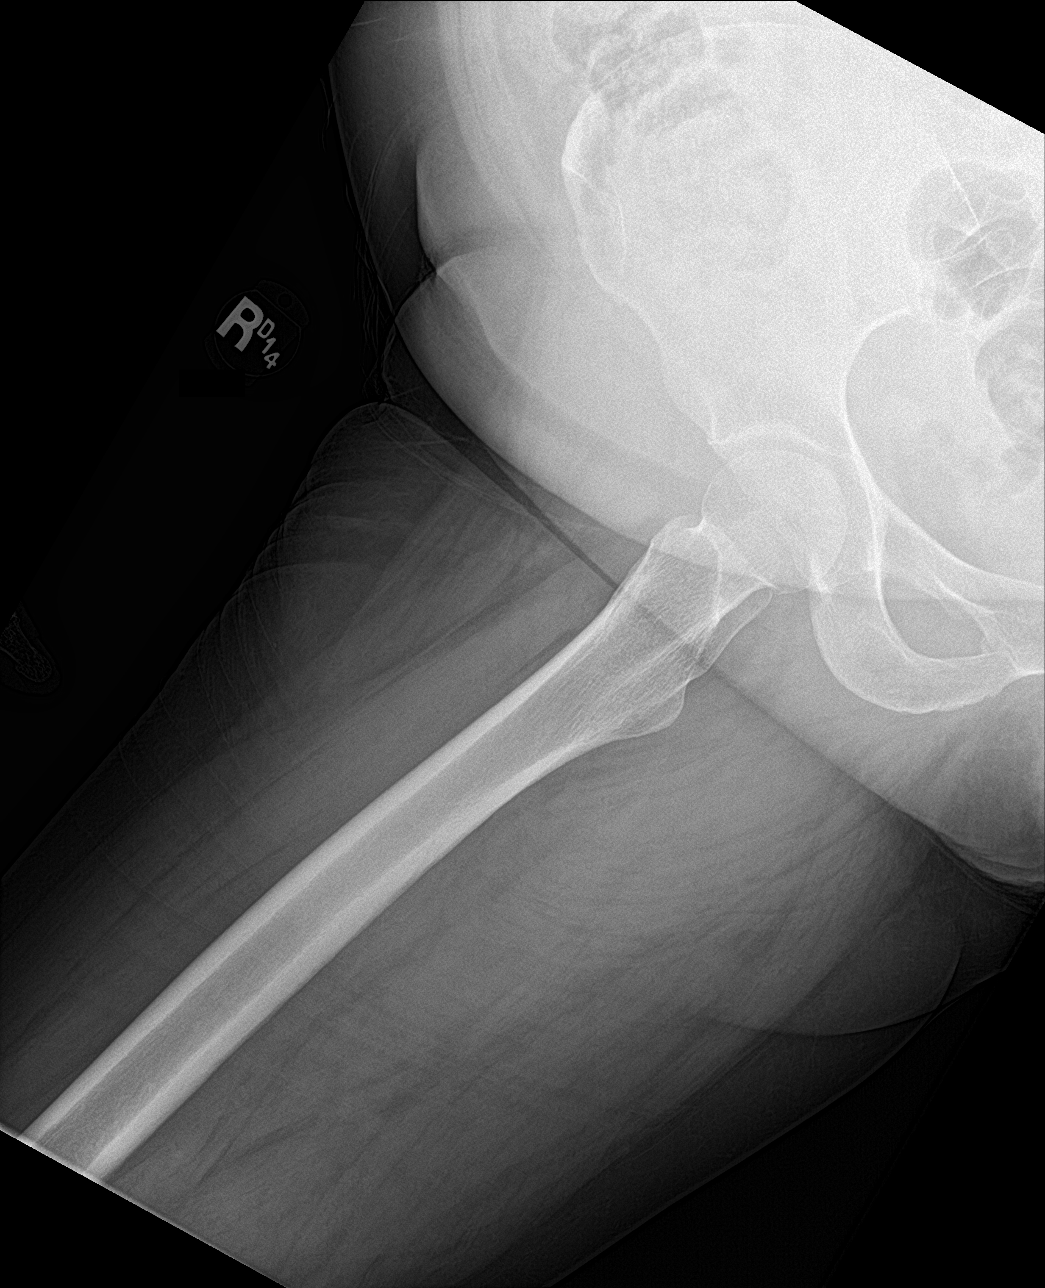

[femur lat (2 of 2)]
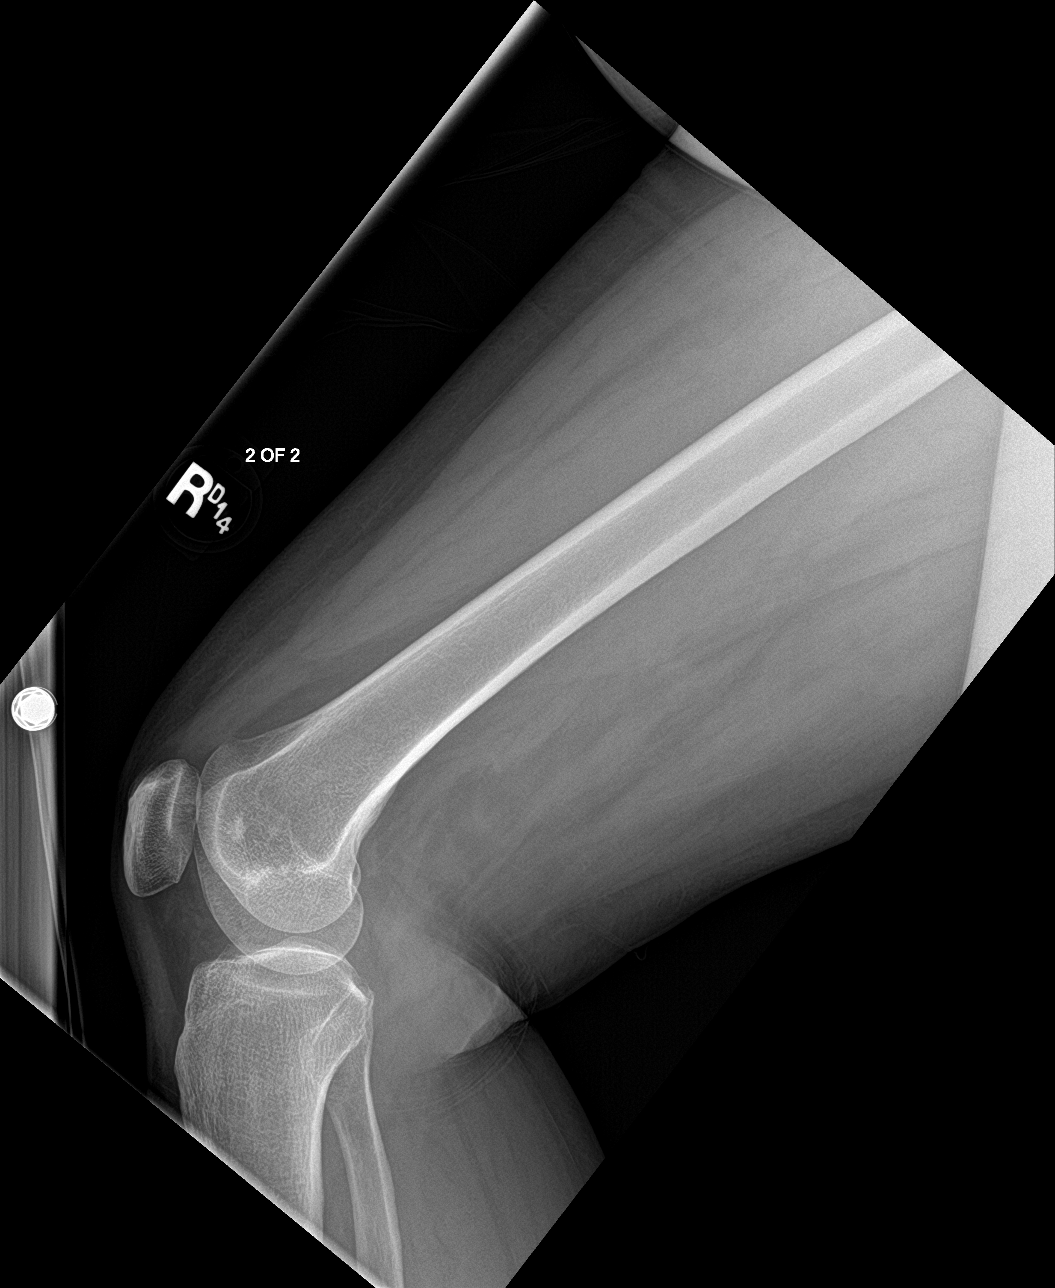

[4 of 4 positions shown; findings below may reference images not displayed]

FINDINGS: Normal alignment. No acute fracture. No focal lesion. The soft
tissues are unremarkable.
IMPRESSION: Unremarkable radiograph of the femur.  No focal osseous lesion.

## 2022-09-24 IMAGING — DX DG HIP (WITH OR WITHOUT PELVIS) 2-3V*L*
3 series · 3 of 3 positions shown · non-contrast
Comparison: None.

CLINICAL DATA: Bilateral Hip, Right thigh pain. Hx of Bone lesions
2nd to Sarcoidosis not neoplastic

EXAM:
DG HIP (WITH OR WITHOUT PELVIS) 2-3V LEFT

[pelvis ap]
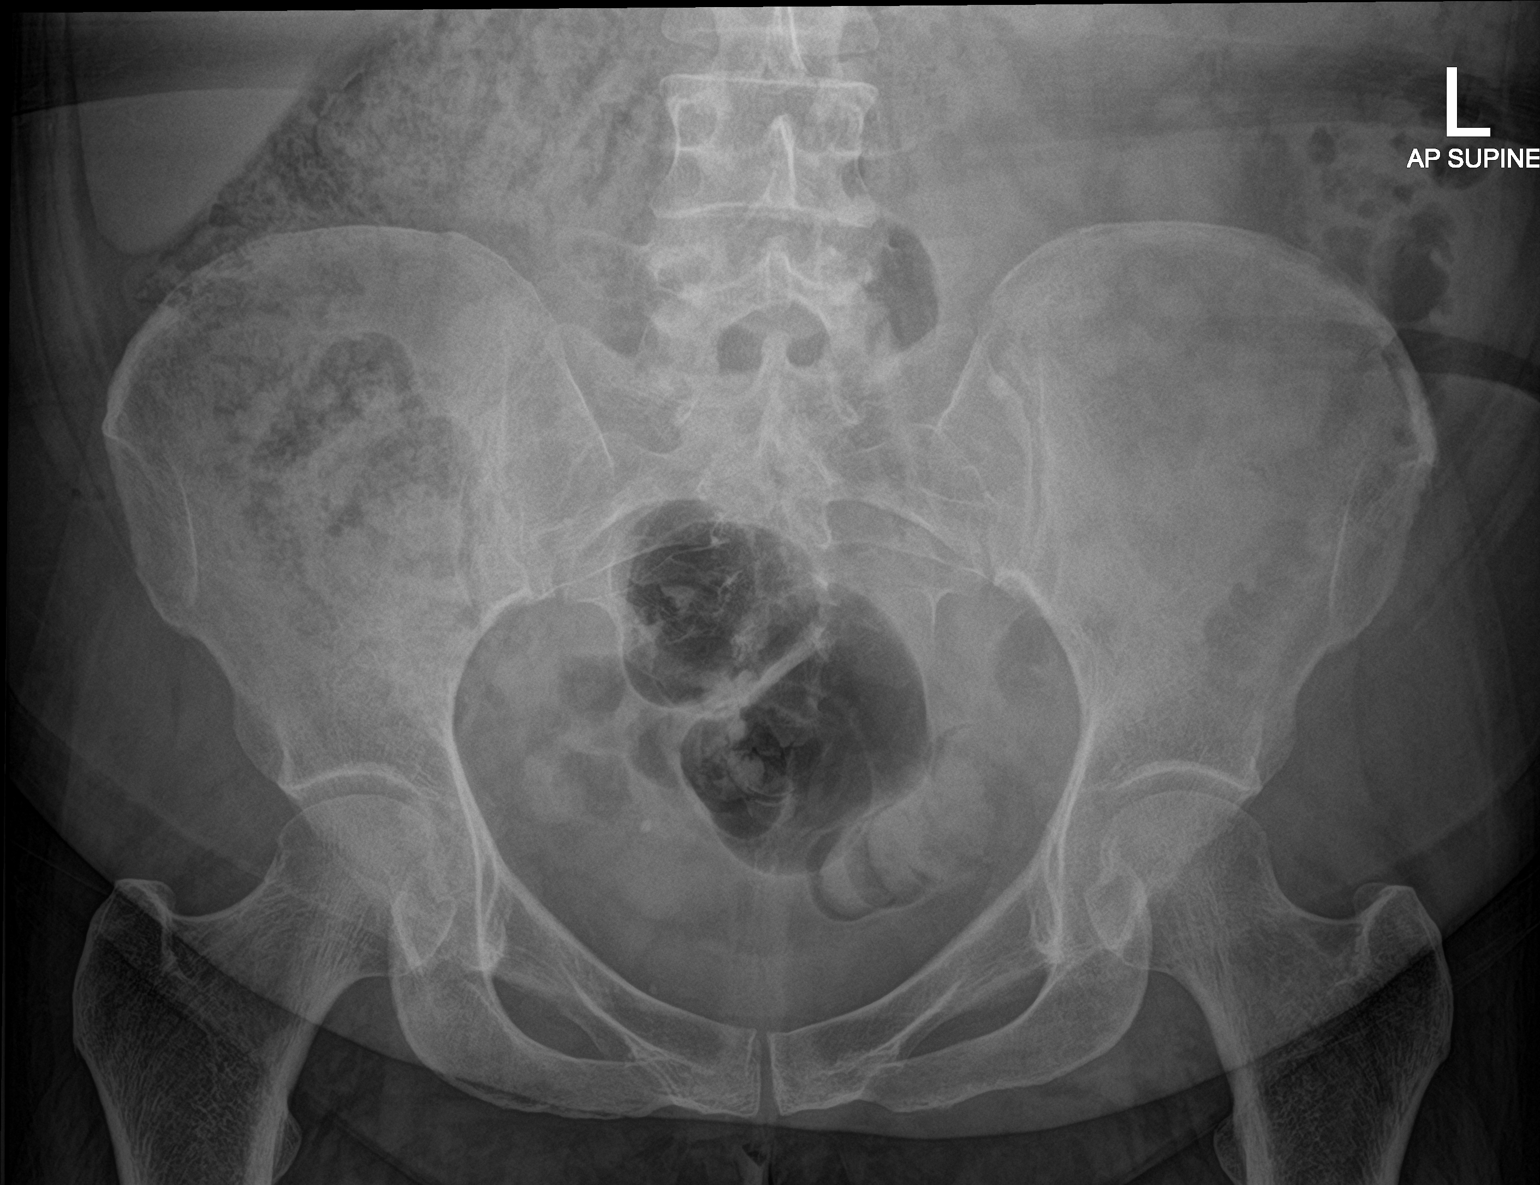

[hip ap]
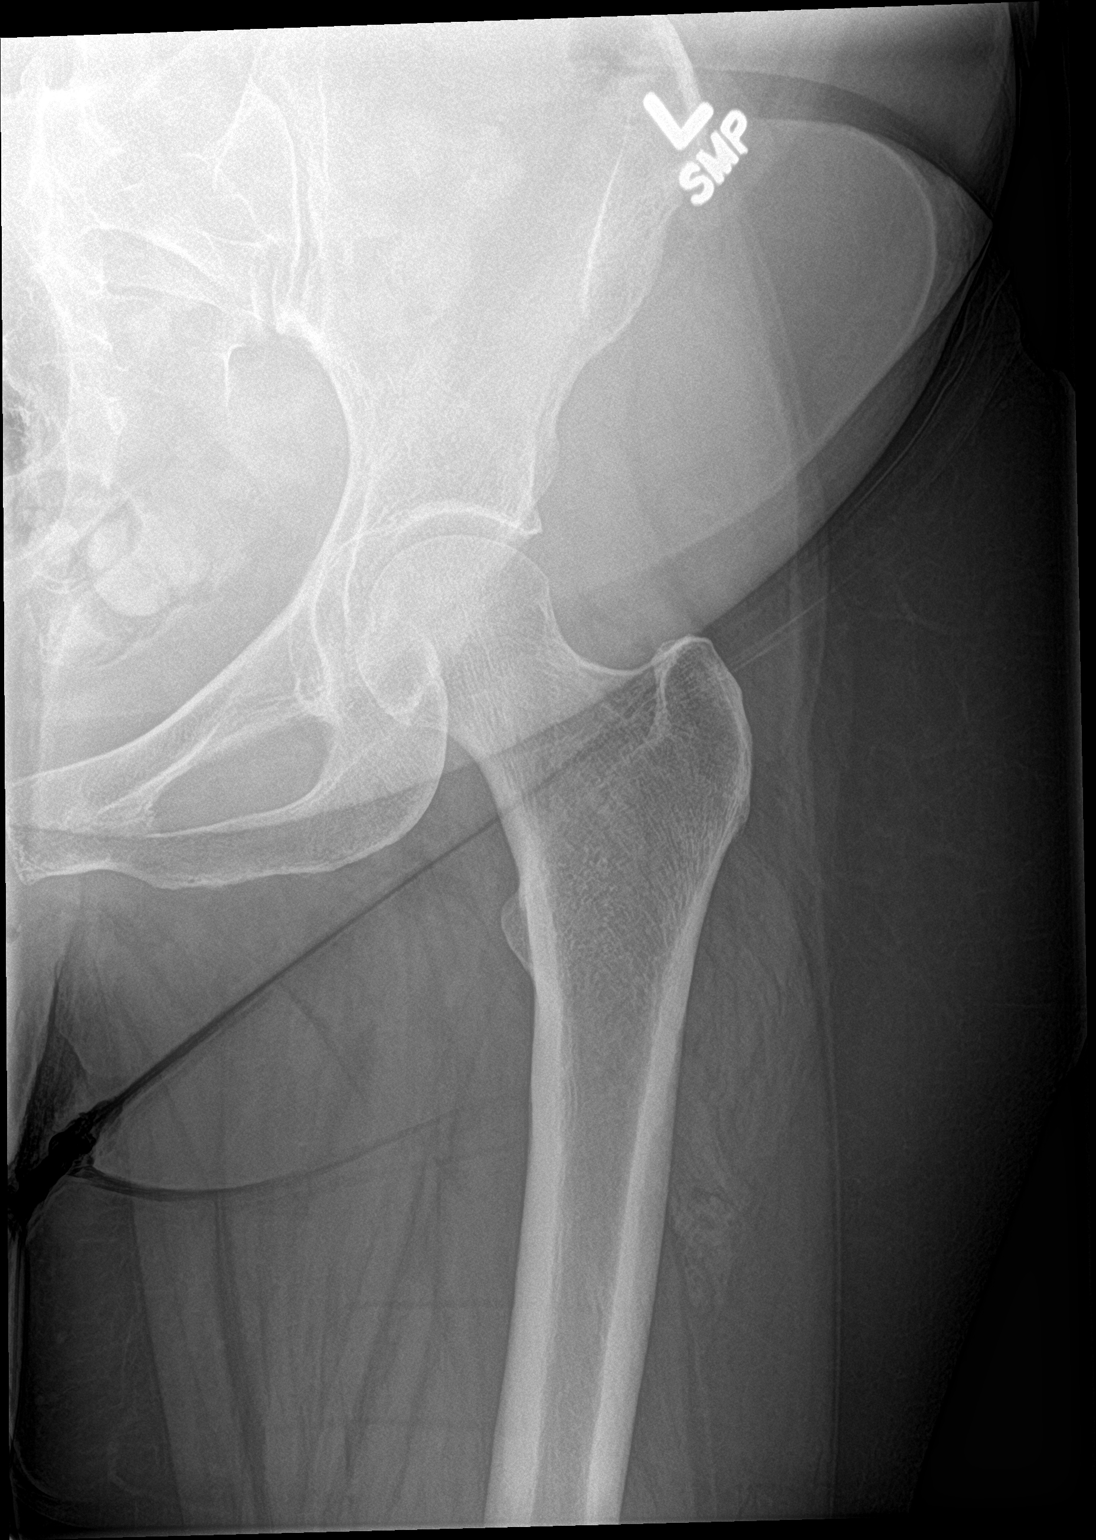

[hip frog leg]
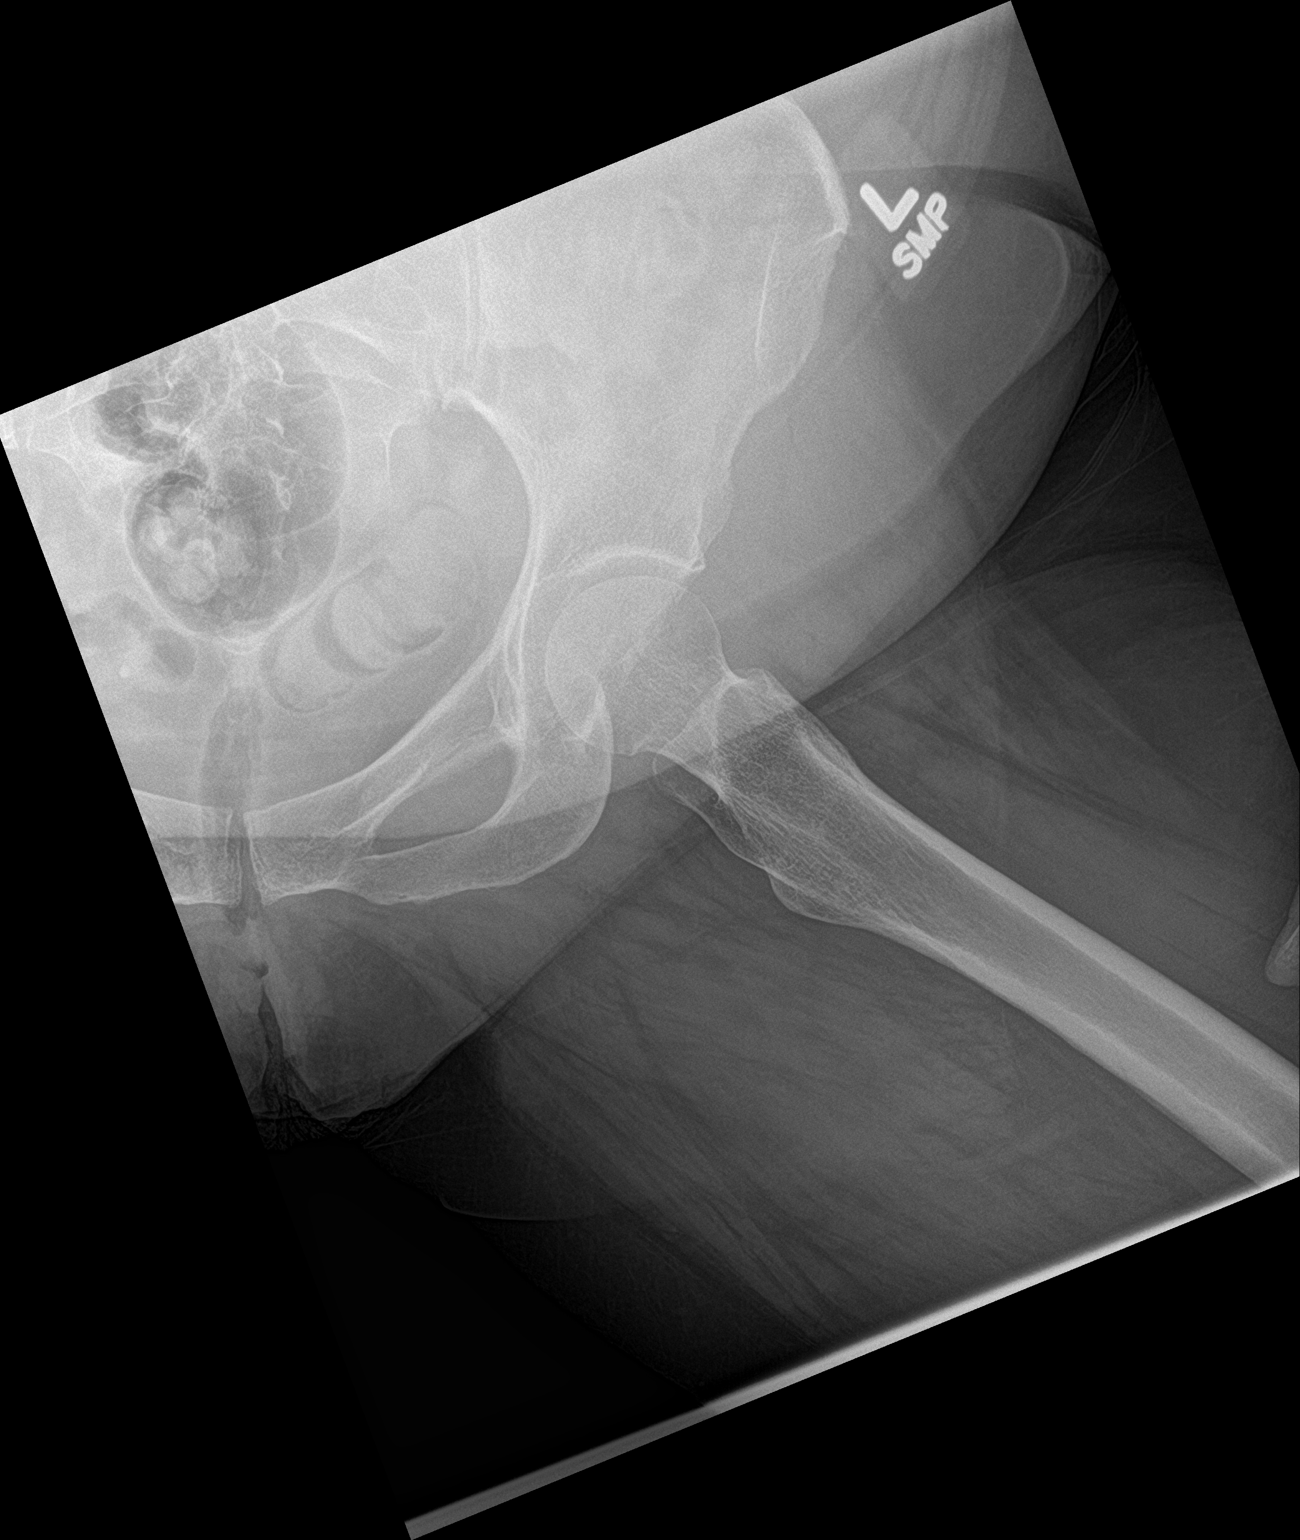

[3 of 3 positions shown; findings below may reference images not displayed]

FINDINGS: Normal alignment. No acute fracture. No significant degenerative
changes of the hips. No focal bone lesion. The soft tissues are
unremarkable.
IMPRESSION: Unremarkable radiograph of the pelvis.  No focal bone lesion.

## 2022-09-26 ENCOUNTER — Other Ambulatory Visit: Payer: Self-pay | Admitting: Gastroenterology

## 2022-09-26 DIAGNOSIS — K219 Gastro-esophageal reflux disease without esophagitis: Secondary | ICD-10-CM

## 2022-10-01 DIAGNOSIS — Z880 Allergy status to penicillin: Secondary | ICD-10-CM | POA: Diagnosis not present

## 2022-10-01 DIAGNOSIS — Z882 Allergy status to sulfonamides status: Secondary | ICD-10-CM | POA: Diagnosis not present

## 2022-10-01 DIAGNOSIS — K828 Other specified diseases of gallbladder: Secondary | ICD-10-CM | POA: Diagnosis not present

## 2022-10-01 DIAGNOSIS — Z888 Allergy status to other drugs, medicaments and biological substances status: Secondary | ICD-10-CM | POA: Diagnosis not present

## 2022-10-01 DIAGNOSIS — R1010 Upper abdominal pain, unspecified: Secondary | ICD-10-CM | POA: Diagnosis not present

## 2022-10-12 ENCOUNTER — Other Ambulatory Visit: Payer: Self-pay | Admitting: Pulmonary Disease

## 2022-10-12 ENCOUNTER — Other Ambulatory Visit: Payer: Self-pay | Admitting: Family Medicine

## 2022-10-12 DIAGNOSIS — Z79899 Other long term (current) drug therapy: Secondary | ICD-10-CM

## 2022-10-12 DIAGNOSIS — D869 Sarcoidosis, unspecified: Secondary | ICD-10-CM

## 2022-10-12 DIAGNOSIS — D849 Immunodeficiency, unspecified: Secondary | ICD-10-CM

## 2022-10-12 DIAGNOSIS — E1165 Type 2 diabetes mellitus with hyperglycemia: Secondary | ICD-10-CM

## 2022-10-14 ENCOUNTER — Ambulatory Visit: Payer: Medicaid Other | Admitting: Family Medicine

## 2022-10-14 ENCOUNTER — Other Ambulatory Visit: Payer: Self-pay | Admitting: Family Medicine

## 2022-10-14 VITALS — BP 102/70 | HR 80 | Temp 98.1°F | Ht 63.0 in | Wt 171.6 lb

## 2022-10-14 DIAGNOSIS — I10 Essential (primary) hypertension: Secondary | ICD-10-CM

## 2022-10-14 DIAGNOSIS — Z794 Long term (current) use of insulin: Secondary | ICD-10-CM

## 2022-10-14 DIAGNOSIS — E119 Type 2 diabetes mellitus without complications: Secondary | ICD-10-CM

## 2022-10-14 DIAGNOSIS — F419 Anxiety disorder, unspecified: Secondary | ICD-10-CM

## 2022-10-14 DIAGNOSIS — K828 Other specified diseases of gallbladder: Secondary | ICD-10-CM | POA: Insufficient documentation

## 2022-10-14 DIAGNOSIS — R52 Pain, unspecified: Secondary | ICD-10-CM

## 2022-10-14 DIAGNOSIS — F32A Depression, unspecified: Secondary | ICD-10-CM | POA: Diagnosis not present

## 2022-10-14 DIAGNOSIS — K760 Fatty (change of) liver, not elsewhere classified: Secondary | ICD-10-CM | POA: Insufficient documentation

## 2022-10-14 DIAGNOSIS — K869 Disease of pancreas, unspecified: Secondary | ICD-10-CM

## 2022-10-14 MED ORDER — HYDROCODONE-ACETAMINOPHEN 5-325 MG PO TABS: 1.0000 | ORAL_TABLET | Freq: Two times a day (BID) | ORAL | 0 refills | Status: DC | PRN

## 2022-10-14 MED ORDER — PAROXETINE HCL 10 MG PO TABS: 10.0000 mg | ORAL_TABLET | Freq: Every day | ORAL | 1 refills | Status: DC

## 2022-10-14 NOTE — Patient Instructions (Addendum)
I increased the paxil (take 40 mg + 10 mg daily).  Pain medication refilled.  Follow up in 3 months.

## 2022-10-15 NOTE — Assessment & Plan Note (Signed)
At goal.  Continue losartan and HCTZ.

## 2022-10-15 NOTE — Progress Notes (Signed)
Subjective:  Patient ID: Courtney Hale, female    DOB: Nov 11, 1965  Age: 56 y.o. MRN: [id number]  CC: Chief Complaint  Patient presents with   Follow-up    3 mouth    Depression    HPI:  56 year old female with an extensive past medical history including hypertension, biliary dyskinesia, GERD, hepatic steatosis, IBS, current pancreatic lesion, type 2 diabetes, sarcoidosis, chronic pain in the setting of bone involvement of sarcoidosis presents for follow-up.  Patient is now followed by [facility] regarding her sarcoidosis.  She is on methotrexate and she is also on Humira.  She continues to have significant pain.  She has ongoing issues with abdominal pain but also has bone pain at the sites of sarcoidosis involvement.  Patient uses opioids intermittently.  States that it helps with her pain.  Needs refill today.  Patient struggling with anxiety and depression in the setting of her chronic illness.  PHQ-9 score of 21.  GAD-7 score of 21.  She is currently on Paxil 40 mg daily.  Also on BuSpar.  Uses trazodone for sleep.  Her symptoms or not well-controlled.  With the uncertainties regarding her sarcoidosis as well as current issues with the pancreatic lesion, she is very upset often very tearful.  Patient's blood pressure is well-controlled on losartan and HCTZ.  Follows with endocrinology regarding her diabetes.  Most recent A1c 6.0.  She is on metformin and 30 units of Semglee.  Patient Active Problem List   Diagnosis Date Noted   Pancreatic lesion 10/14/2022   Hepatic steatosis 10/14/2022   Biliary dyskinesia 10/14/2022   Type 2 diabetes mellitus without complications ([place]) 75/64/3329   Insomnia 07/15/2022   Encounter for pain management 03/03/2022   Generalized pain 02/24/2022   High risk medication use 01/06/2022   Immunosuppression ([place]) 11/16/2021   Sarcoidosis 08/14/2021   Gastroesophageal reflux disease without esophagitis 07/27/2017   Anxiety and depression 12/18/2016    IBS (irritable bowel syndrome) 03/18/2016   Melanoma of skin ([place]) 03/18/2016   Headache, chronic migraine without aura 01/31/2013   Hypertension 01/31/2013    Social Hx   Social History   Socioeconomic History   Marital status: Single    Spouse name: Not on file   Number of children: Not on file   Years of education: Not on file   Highest education level: Not on file  Occupational History   Not on file  Tobacco Use   Smoking status: Never    Passive exposure: Past   Smokeless tobacco: Never  Vaping Use   Vaping Use: Never used  Substance and Sexual Activity   Alcohol use: Not Currently   Drug use: No   Sexual activity: Not on file  Other Topics Concern   Not on file  Social History Narrative   Not on file   Social Determinants of Health   Financial Resource Strain: Low Risk  (06/30/2021)   Overall Financial Resource Strain (CARDIA)    Difficulty of Paying Living Expenses: Not hard at all  Food Insecurity: No Food Insecurity (06/30/2021)   Hunger Vital Sign    Worried About Running Out of Food in the Last Year: Never true    [place] in the Last Year: Never true  Transportation Needs: No Transportation Needs (06/30/2021)   PRAPARE - Hydrologist (Medical): No    Lack of Transportation (Non-Medical): No  Physical Activity: Inactive (06/30/2021)   Exercise Vital Sign  Days of Exercise per Week: 0 days    Minutes of Exercise per Session: 0 min  Stress: Stress Concern Present (06/30/2021)   [place]    Feeling of Stress : Very much  Social Connections: Socially Isolated (06/30/2021)   Social Connection and Isolation Panel [NHANES]    Frequency of Communication with Friends and Family: More than three times a week    Frequency of Social Gatherings with Friends and Family: More than three times a week    Attends Religious Services: Never    Marine scientist or  Organizations: No    Attends Music therapist: Never    Marital Status: Never married    Review of Systems Per HPI  Objective:  BP 102/70   Pulse 80   Temp 98.1 F (36.7 C) (Oral)   Ht '5\' 3"'$  (1.6 m)   Wt 171 lb 9.6 oz (77.8 kg)   LMP 10/26/2014   SpO2 98%   BMI 30.40 kg/m      10/14/2022   10:39 AM 08/25/2022   12:11 PM 08/18/2022    2:35 PM  BP/Weight  Systolic BP 062 694 854  Diastolic BP 70 97 82  Wt. (Lbs) 171.6 170.6 175.4  BMI 30.4 kg/m2 29.75 kg/m2 30.58 kg/m2    Physical Exam Vitals and nursing note reviewed.  Constitutional:      General: She is not in acute distress.    Appearance: Normal appearance.  HENT:     Head: Normocephalic and atraumatic.  Cardiovascular:     Rate and Rhythm: Normal rate and regular rhythm.  Pulmonary:     Effort: Pulmonary effort is normal.     Breath sounds: Normal breath sounds.  Neurological:     Mental Status: She is alert.  Psychiatric:        Mood and Affect: Mood normal.        Behavior: Behavior normal.     Lab Results  Component Value Date   WBC 6.0 08/18/2022   HGB 13.1 08/18/2022   HCT 38.0 08/18/2022   PLT 313 08/18/2022   GLUCOSE 143 (H) 08/18/2022   CHOL 258 (H) 09/25/2021   TRIG 169 (H) 09/25/2021   HDL 59 09/25/2021   LDLCALC 168 (H) 09/25/2021   ALT 33 08/18/2022   AST 30 08/18/2022   NA 140 08/18/2022   K 3.5 08/18/2022   CL 106 08/18/2022   CREATININE 0.65 08/18/2022   BUN 11 08/18/2022   CO2 23 08/18/2022   TSH 0.93 07/15/2022   INR 0.9 07/21/2021   HGBA1C 6.0 (A) 07/29/2022   MICROALBUR '10mg'$ /L 07/29/2022     Assessment & Plan:   Problem List Items Addressed This Visit       Cardiovascular and Mediastinum   Hypertension    At goal.  Continue losartan and HCTZ.        Endocrine   Type 2 diabetes mellitus without complications ([place]) - Primary    Currently well-controlled.        Other   Anxiety and depression    Uncontrolled.  Increasing Paxil.  Patient  will now be taking 10 mg tablet in addition to her 40 mg tablet to make a total of 50 mg.      Relevant Medications   PARoxetine (PAXIL) 10 MG tablet   Encounter for pain management    Patient is a complicated case.  She has severe pain as a result of bone involvement  of sarcoidosis. She is compliant with her medications.  I have refilled her hydrocodone today. [place] controlled substance database reviewed.       Meds ordered this encounter  Medications   PARoxetine (PAXIL) 10 MG tablet    Sig: Take 1 tablet (10 mg total) by mouth daily.    Dispense:  90 tablet    Refill:  1   HYDROcodone-acetaminophen (NORCO/VICODIN) 5-325 MG tablet    Sig: Take 1-2 tablets by mouth 2 (two) times daily as needed for moderate pain or severe pain.    Dispense:  90 tablet    Refill:  0    Follow-up:  3 months  [place] DO [place]

## 2022-10-15 NOTE — Assessment & Plan Note (Signed)
Patient is a complicated case.  She has severe pain as a result of bone involvement of sarcoidosis. She is compliant with her medications.  I have refilled her hydrocodone today. [place] controlled substance database reviewed.

## 2022-10-15 NOTE — Assessment & Plan Note (Signed)
Uncontrolled.  Increasing Paxil.  Patient will now be taking 10 mg tablet in addition to her 40 mg tablet to make a total of 50 mg.

## 2022-10-15 NOTE — Assessment & Plan Note (Signed)
Currently well controlled.  

## 2022-10-20 ENCOUNTER — Other Ambulatory Visit: Payer: Self-pay | Admitting: *Deleted

## 2022-10-20 DIAGNOSIS — D849 Immunodeficiency, unspecified: Secondary | ICD-10-CM

## 2022-10-20 DIAGNOSIS — D869 Sarcoidosis, unspecified: Secondary | ICD-10-CM

## 2022-10-20 DIAGNOSIS — Z79899 Other long term (current) drug therapy: Secondary | ICD-10-CM

## 2022-10-20 DIAGNOSIS — Z85820 Personal history of malignant melanoma of skin: Secondary | ICD-10-CM | POA: Diagnosis not present

## 2022-10-20 DIAGNOSIS — Z85828 Personal history of other malignant neoplasm of skin: Secondary | ICD-10-CM | POA: Diagnosis not present

## 2022-10-20 DIAGNOSIS — L821 Other seborrheic keratosis: Secondary | ICD-10-CM | POA: Diagnosis not present

## 2022-10-20 DIAGNOSIS — L814 Other melanin hyperpigmentation: Secondary | ICD-10-CM | POA: Diagnosis not present

## 2022-10-20 DIAGNOSIS — D1801 Hemangioma of skin and subcutaneous tissue: Secondary | ICD-10-CM | POA: Diagnosis not present

## 2022-10-20 DIAGNOSIS — Z08 Encounter for follow-up examination after completed treatment for malignant neoplasm: Secondary | ICD-10-CM | POA: Diagnosis not present

## 2022-10-20 MED ORDER — FOLIC ACID 1 MG PO TABS: ORAL_TABLET | ORAL | 1 refills | Status: DC

## 2022-10-25 ENCOUNTER — Other Ambulatory Visit: Payer: Self-pay | Admitting: Family Medicine

## 2022-10-25 DIAGNOSIS — E1165 Type 2 diabetes mellitus with hyperglycemia: Secondary | ICD-10-CM

## 2022-11-09 ENCOUNTER — Other Ambulatory Visit: Payer: Self-pay | Admitting: Pulmonary Disease

## 2022-11-09 DIAGNOSIS — D869 Sarcoidosis, unspecified: Secondary | ICD-10-CM

## 2022-11-09 DIAGNOSIS — Z79899 Other long term (current) drug therapy: Secondary | ICD-10-CM

## 2022-11-09 DIAGNOSIS — D849 Immunodeficiency, unspecified: Secondary | ICD-10-CM

## 2022-11-10 ENCOUNTER — Telehealth: Payer: Self-pay

## 2022-11-10 NOTE — Telephone Encounter (Signed)
Returned the pt's call left on my messages, no ans/LMOVM

## 2022-11-23 ENCOUNTER — Other Ambulatory Visit: Payer: Self-pay | Admitting: Family Medicine

## 2022-11-24 ENCOUNTER — Telehealth: Payer: Self-pay | Admitting: Family Medicine

## 2022-11-24 NOTE — Telephone Encounter (Signed)
Freestyle Libre 2 Sensor has been denied due to not seeing certain detail about use and treatment. Denial in provider office for review. Please advise. Thank you

## 2022-11-29 ENCOUNTER — Other Ambulatory Visit: Payer: Self-pay | Admitting: Family Medicine

## 2022-11-29 ENCOUNTER — Ambulatory Visit: Payer: Medicaid Other | Admitting: Nutrition

## 2022-11-29 ENCOUNTER — Encounter: Payer: Self-pay | Admitting: Nurse Practitioner

## 2022-11-29 ENCOUNTER — Ambulatory Visit: Payer: Medicaid Other | Admitting: Nurse Practitioner

## 2022-11-29 VITALS — BP 104/71 | HR 83 | Ht 63.0 in | Wt 166.4 lb

## 2022-11-29 DIAGNOSIS — E119 Type 2 diabetes mellitus without complications: Secondary | ICD-10-CM | POA: Diagnosis not present

## 2022-11-29 DIAGNOSIS — Z794 Long term (current) use of insulin: Secondary | ICD-10-CM | POA: Diagnosis not present

## 2022-11-29 DIAGNOSIS — I10 Essential (primary) hypertension: Secondary | ICD-10-CM | POA: Diagnosis not present

## 2022-11-29 DIAGNOSIS — E782 Mixed hyperlipidemia: Secondary | ICD-10-CM | POA: Diagnosis not present

## 2022-11-29 LAB — POCT GLYCOSYLATED HEMOGLOBIN (HGB A1C): Hemoglobin A1C: 5.8 % — AB (ref 4.0–5.6)

## 2022-11-29 MED ORDER — OZEMPIC (0.25 OR 0.5 MG/DOSE) 2 MG/1.5ML ~~LOC~~ SOPN: 0.5000 mg | PEN_INJECTOR | SUBCUTANEOUS | 3 refills | Status: DC

## 2022-11-29 NOTE — Progress Notes (Signed)
Endocrinology Follow Up Note       11/29/2022, 3:04 PM   Subjective:    Patient ID: Courtney Hale, female    DOB: August 07, 1966.  Courtney Hale is being seen in follow up after being seen in consultation for management of currently uncontrolled symptomatic diabetes requested by  Coral Spikes, DO.   Past Medical History:  Diagnosis Date   Cancer [facility])    Skin   Complication of anesthesia    woke up during endoscopy and colonoscopy   Depression    Diabetes ([place])    GERD (gastroesophageal reflux disease)    Hemorrhoids    History of esophageal stricture    2011  peptic w/  dilatation   History of gastritis    2011   History of melanoma excision    2015  left leg/   12-09-2015 back    Hypertension    Microhematuria    Migraines    Nephrolithiasis    left side non-obstructive    Right ureteral stone    Sarcoidosis 07/24/2021   Urgency of urination    Wears contact lenses     Past Surgical History:  Procedure Laterality Date   ABDOMINAL HYSTERECTOMY     BALLOON DILATION N/A 05/03/2022   Procedure: BALLOON DILATION;  Surgeon: Eloise Harman, DO;  [facility];  Service: Endoscopy;  Laterality: N/A;   BIOPSY  11/16/2021   Procedure: BIOPSY;  Surgeon: Eloise Harman, DO;  [facility];  Service: Endoscopy;;   BIOPSY  05/03/2022   Procedure: BIOPSY;  Surgeon: Eloise Harman, DO;  [facility];  Service: Endoscopy;;   CARPAL TUNNEL RELEASE Right 10/29/2013   COLONOSCOPY N/A 08/27/2015   Procedure: COLONOSCOPY;  Surgeon: Danie Binder, MD;  [facility]: [facility];  Service: Endoscopy;  Laterality: N/A;  0830   COLONOSCOPY WITH PROPOFOL N/A 11/16/2021   Surgeon: Eloise Harman, DO;  Nonbleeding internal hemorrhoids, otherwise normal exam.  Recommended 10-year screening colonoscopy.   CYST EXCISION  10/25/2010   right hand   CYSTOSCOPY W/ URETERAL STENT  PLACEMENT Right 12/22/2015   Procedure: CYSTOSCOPY WITH RETROGRADE PYELOGRAM/URETERAL STENT PLACEMENT;  Surgeon: Nickie Retort, MD;  Location: [facility];  Service: Urology;  Laterality: Right;   CYSTOSCOPY WITH RETROGRADE PYELOGRAM, URETEROSCOPY AND STENT PLACEMENT Right 01/05/2016   Procedure: CYSTOSCOPY WITH RETROGRADE PYELOGRAM, URETEROSCOPY AND STENT REPLACEMENT;  Surgeon: Nickie Retort, MD;  Location: [facility];  Service: Urology;  Laterality: Right;   DILATION AND CURETTAGE OF UTERUS  03/12/2009   w/  Suction   double balloon enteroscopy     Dr. Arsenio Loader at [facility]: no erosions, no evidence of Crohn's disease, path without Crohn's.    EGD with push enteroscopy  02/12/2010    patent distal peptic stricture with diffuse antral erythema, normal D1 and D2    ESOPHAGOGASTRODUODENOSCOPY (EGD) WITH PROPOFOL N/A 11/16/2021   Surgeon: Eloise Harman, DO;   Gastritis biopsied, otherwise normal exam.  Pathology with mild chronic gastritis, negative for H. pylori.   ESOPHAGOGASTRODUODENOSCOPY (EGD) WITH PROPOFOL N/A 05/03/2022   Surgeon: Eloise Harman, DO;  Normal esophagus s/p empiric dilation,  gastritis biopsied.  Pathology with mild chronic, focally active gastritis, negative for H. pylori.   HAND SURGERY     LAPAROSCOPIC ASSISTED VAGINAL HYSTERECTOMY  10/26/2014   w/  Bilateral Salpingoophorectomy   STONE EXTRACTION WITH BASKET Right 01/05/2016   Procedure: STONE EXTRACTION WITH BASKET;  Surgeon: Nickie Retort, MD;  Location: [facility];  Service: Urology;  Laterality: Right;   TONSILLECTOMY  1998  approx    Social History   Socioeconomic History   Marital status: Single    Spouse name: Not on file   Number of children: Not on file   Years of education: Not on file   Highest education level: Not on file  Occupational History   Not on file  Tobacco Use   Smoking status: Never    Passive exposure: Past   Smokeless tobacco: Never   Vaping Use   Vaping Use: Never used  Substance and Sexual Activity   Alcohol use: Not Currently   Drug use: No   Sexual activity: Not on file  Other Topics Concern   Not on file  Social History Narrative   Not on file   Social Determinants of Health   Financial Resource Strain: Low Risk  (06/30/2021)   Overall Financial Resource Strain (CARDIA)    Difficulty of Paying Living Expenses: Not hard at all  Food Insecurity: No Food Insecurity (06/30/2021)   Hunger Vital Sign    Worried About Running Out of Food in the Last Year: Never true    [place] in the Last Year: Never true  Transportation Needs: No Transportation Needs (06/30/2021)   PRAPARE - Hydrologist (Medical): No    Lack of Transportation (Non-Medical): No  Physical Activity: Inactive (06/30/2021)   Exercise Vital Sign    Days of Exercise per Week: 0 days    Minutes of Exercise per Session: 0 min  Stress: Stress Concern Present (06/30/2021)   [place]    Feeling of Stress : Very much  Social Connections: Socially Isolated (06/30/2021)   Social Connection and Isolation Panel [NHANES]    Frequency of Communication with Friends and Family: More than three times a week    Frequency of Social Gatherings with Friends and Family: More than three times a week    Attends Religious Services: Never    Marine scientist or Organizations: No    Attends Music therapist: Never    Marital Status: Never married    Family History  Problem Relation Age of Onset   Hypertension Mother    Thyroid disease Mother    Diabetes Mother    Hyperlipidemia Mother    Stroke Mother    Heart disease Mother    Hyperlipidemia Father    Hypertension Father    Heart attack Father    Heart failure Father    Colon cancer Neg Hx     Outpatient Encounter Medications as of 11/29/2022  Medication Sig   albuterol (VENTOLIN HFA) 108 (90  Base) MCG/ACT inhaler Inhale 2 puffs into the lungs every 6 (six) hours as needed for wheezing or shortness of breath.   BD PEN NEEDLE NANO 2ND GEN 32G X 4 MM MISC USE AS DIRECTED   busPIRone (BUSPAR) 5 MG tablet TAKE 1 TABLET BY MOUTH THREE TIMES DAILY   calcium carbonate (TUMS - DOSED IN MG ELEMENTAL CALCIUM) 500 MG chewable tablet Chew 1,500 mg by mouth  2 (two) times daily as needed for indigestion or heartburn.   Carboxymethylcellul-Glycerin (LUBRICATING EYE DROPS OP) Place 1 drop into both eyes daily as needed (dry eyes).   Chromium 1000 MCG TABS Take 1,000 mcg by mouth daily.   CINNAMON PO Take 2,000 mg by mouth daily.   Continuous Blood Gluc Receiver (FREESTYLE LIBRE 2 READER) DEVI Use as directed to check blood sugars.   Continuous Blood Gluc Sensor (FREESTYLE LIBRE 2 SENSOR) MISC Use sensor as directed. Change every 10-14 days.   Cyanocobalamin (B-12) 2000 MCG TABS Take 2,000 mcg by mouth daily.   dicyclomine (BENTYL) 10 MG capsule Take 1 capsule (10 mg total) by mouth up to 4 (four) times daily (before meals and at bedtime) for diarrhea and abdominal cramping.   EPINEPHrine 0.3 mg/0.3 mL IJ SOAJ injection Inject 0.3 mg into the muscle as needed for anaphylaxis.   folic acid (FOLVITE) 1 MG tablet TAKE 1 TABLET BY MOUTH IN THE MORNING AND AT BEDTIME   hydrochlorothiazide (HYDRODIURIL) 25 MG tablet Take 1 tablet (25 mg total) by mouth daily.   HYDROcodone-acetaminophen (NORCO/VICODIN) 5-325 MG tablet Take 1-2 tablets by mouth 2 (two) times daily as needed for moderate pain or severe pain.   lansoprazole (PREVACID) 30 MG capsule Take 1 capsule (30 mg total) by mouth 2 (two) times daily before a meal.   losartan (COZAAR) 100 MG tablet Take 1 tablet by mouth once daily   metFORMIN (GLUCOPHAGE) 850 MG tablet TAKE 1 TABLET BY MOUTH TWICE DAILY WITH A MEAL   methotrexate (RHEUMATREX) 2.5 MG tablet TAKE 8 TABLETS BY MOUTH ONCE A WEEK   PARoxetine (PAXIL) 10 MG tablet Take 1 tablet (10 mg total) by  mouth daily.   PARoxetine (PAXIL) 40 MG tablet TAKE 1 TABLET BY MOUTH ONCE DAILY IN THE MORNING   Probiotic Product (PROBIOTIC PO) Take 1 capsule by mouth daily.   prochlorperazine (COMPAZINE) 10 MG tablet Take 1 tablet (10 mg total) by mouth every 6 (six) hours as needed for nausea or vomiting.   rizatriptan (MAXALT-MLT) 10 MG disintegrating tablet Take 1 tablet (10 mg total) by mouth as needed for migraine. May repeat in 2 hours if needed. Max 2 per 24 hours   scopolamine (TRANSDERM-SCOP) 1 MG/3DAYS Place 1 patch (1.5 mg total) onto the skin every 3 (three) days.   Semaglutide,0.25 or 0.'5MG'$ /DOS, (OZEMPIC, 0.25 OR 0.5 MG/DOSE,) 2 MG/1.5ML SOPN Inject 0.5 mg into the skin once a week.   sucralfate (CARAFATE) 1 g tablet Take 1 tablet (1 g total) by mouth 4 (four) times daily -  with meals and at bedtime.   traZODone (DESYREL) 150 MG tablet Take 1 tablet (150 mg total) by mouth at bedtime as needed for sleep.   valACYclovir (VALTREX) 500 MG tablet Take 500 mg by mouth 2 (two) times daily as needed (outbreaks).   [DISCONTINUED] SEMGLEE, YFGN, 100 UNIT/ML Pen INJECT 30 UNITS SUBCUTANEOUSLY EVERY NIGHT   No facility-administered encounter medications on file as of 11/29/2022.    ALLERGIES: Allergies  Allergen Reactions   Topamax [Topiramate] Other (See Comments)    Developed kidney stones   Fluorouracil Swelling    Burning skin   Sulfa Antibiotics Other (See Comments)    Burning sensation, flushing to skin    VACCINATION STATUS: Immunization History  Administered Date(s) Administered   Influenza,inj,Quad PF,6+ Mos 09/09/2016, 08/29/2017, 09/27/2019, 10/30/2020, 08/14/2021   Influenza-Unspecified 08/22/2013, 07/22/2014, 08/08/2015   MMR 01/22/2011   PFIZER(Purple Top)SARS-COV-2 Vaccination 12/30/2019, 01/20/2020   Tdap 01/22/2011, 04/17/2014  Diabetes She presents for her follow-up diabetic visit. She has type 2 diabetes mellitus. Onset time: diagnosed at approx age of 81. Her disease  course has been improving. There are no hypoglycemic associated symptoms. There are no diabetic associated symptoms. There are no hypoglycemic complications. There are no diabetic complications. Risk factors for coronary artery disease include diabetes mellitus, dyslipidemia, family history, hypertension and obesity. Current diabetic treatment includes oral agent (monotherapy) and insulin injections. She is compliant with treatment some of the time (has only needed 1-2 injections of insulin each week). Her weight is decreasing steadily. She is following a generally healthy diet. When asked about meal planning, she reported none. She has not had a previous visit with a dietitian. She participates in exercise intermittently. Her overall blood glucose range is 90-110 mg/dl. (She presents today with her CGM showing at goal glycemic profile.  Her POCT A1c today is 5.8%, improving from last visit of 6%.  She notes she only takes her Semglee if glucose is high (ending up being 1-2 times per week).  She notes she was started on Humira for her sarcoidosis but thinks she may be allergic due to whelp at injection site.  Analysis of her CGM shows TIR 100%.) An ACE inhibitor/angiotensin II receptor blocker is being taken. She does not see a podiatrist.Eye exam is current.  Hyperlipidemia This is a chronic problem. The current episode started more than 1 year ago. The problem is uncontrolled. Recent lipid tests were reviewed and are high. Exacerbating diseases include diabetes and obesity. Factors aggravating her hyperlipidemia include thiazides and fatty foods. She is currently on no antihyperlipidemic treatment. Compliance problems include adherence to diet and adherence to exercise.  Risk factors for coronary artery disease include diabetes mellitus, dyslipidemia, family history, obesity and hypertension.  Hypertension This is a chronic problem. The current episode started more than 1 year ago. The problem has been  resolved since onset. The problem is controlled. Agents associated with hypertension include steroids. Risk factors for coronary artery disease include diabetes mellitus, dyslipidemia, family history and obesity. Past treatments include diuretics and angiotensin blockers. The current treatment provides significant improvement. There are no compliance problems.      Review of systems  Constitutional: + Minimally fluctuating body weight, current Body mass index is 29.48 kg/m., no fatigue, no subjective hyperthermia, no subjective hypothermia Eyes: no blurry vision, no xerophthalmia ENT: no sore throat, no nodules palpated in throat, no dysphagia/odynophagia, no hoarseness Cardiovascular: no chest pain, no shortness of breath, no palpitations, no leg swelling Respiratory: no cough, no shortness of breath Gastrointestinal: no nausea/vomiting/diarrhea Musculoskeletal: generalized aches and pains- says r/t sarcoidosis Skin: no rashes, no hyperemia Neurological: no tremors, no numbness, no tingling, no dizziness Psychiatric: no depression, no anxiety  Objective:     BP 104/71 (BP Location: Right Arm, Patient Position: Sitting, Cuff Size: Normal)   Pulse 83   Ht '5\' 3"'$  (1.6 m)   Wt 166 lb 6.4 oz (75.5 kg)   LMP 10/26/2014   BMI 29.48 kg/m   Wt Readings from Last 3 Encounters:  11/29/22 166 lb 6.4 oz (75.5 kg)  10/14/22 171 lb 9.6 oz (77.8 kg)  08/25/22 170 lb 9.6 oz (77.4 kg)     BP Readings from Last 3 Encounters:  11/29/22 104/71  10/14/22 102/70  08/25/22 (!) 134/97      Physical Exam- Limited  Constitutional:  Body mass index is 29.48 kg/m. , not in acute distress, normal state of mind Eyes:  EOMI, no exophthalmos  Musculoskeletal: no gross deformities, strength intact in all four extremities, no gross restriction of joint movements Skin:  no rashes, no hyperemia Neurological: no tremor with outstretched hands  Diabetic Foot Exam - Simple   No data filed     CMP (  most recent) CMP     Component Value Date/Time   NA 140 08/18/2022 1131   NA 136 11/18/2021 1026   K 3.5 08/18/2022 1131   CL 106 08/18/2022 1131   CO2 23 08/18/2022 1131   GLUCOSE 143 (H) 08/18/2022 1131   BUN 11 08/18/2022 1131   BUN 19 11/18/2021 1026   CREATININE 0.65 08/18/2022 1131   CREATININE 0.66 07/15/2022 1511   CALCIUM 9.2 08/18/2022 1131   PROT 6.5 08/18/2022 1131   PROT 6.1 11/18/2021 1026   ALBUMIN 3.8 08/18/2022 1131   ALBUMIN 4.2 11/18/2021 1026   AST 30 08/18/2022 1131   ALT 33 08/18/2022 1131   ALKPHOS 70 08/18/2022 1131   BILITOT 0.6 08/18/2022 1131   BILITOT 0.3 11/18/2021 1026   GFRNONAA >60 08/18/2022 1131   GFRAA 121 08/27/2020 1517     Diabetic Labs (most recent): Lab Results  Component Value Date   HGBA1C 5.8 (A) 11/29/2022   HGBA1C 6.0 (A) 07/29/2022   HGBA1C 6.4 02/25/2022   MICROALBUR '10mg'$ /L 07/29/2022     Lipid Panel ( most recent) Lipid Panel     Component Value Date/Time   CHOL 258 (H) 09/25/2021 1134   TRIG 169 (H) 09/25/2021 1134   HDL 59 09/25/2021 1134   CHOLHDL 4.4 09/25/2021 1134   LDLCALC 168 (H) 09/25/2021 1134   LABVLDL 31 09/25/2021 1134      Lab Results  Component Value Date   TSH 0.93 07/15/2022   TSH 0.996 08/27/2020   TSH 1.220 06/09/2016   TSH 0.785 08/13/2015           Assessment & Plan:   1) Controlled type 2 diabetes mellitus without complication  She presents today with her CGM showing at goal glycemic profile.  Her POCT A1c today is 5.8%, improving from last visit of 6%.  She notes she only takes her Semglee if glucose is high (ending up being 1-2 times per week).  She notes she was started on Humira for her sarcoidosis but thinks she may be allergic due to whelp at injection site.  Analysis of her CGM shows TIR 100%.  - Courtney Hale has currently uncontrolled symptomatic type 2 DM since 57 years of age.   -Recent labs reviewed.  - I had a long discussion with her about the progressive  nature of diabetes and the pathology behind its complications. -her diabetes is complicated by chronic steroid use due to sarcoidosis and she remains at a high risk for more acute and chronic complications which include CAD, CVA, CKD, retinopathy, and neuropathy. These are all discussed in detail with her.  The following Lifestyle Medicine recommendations according to [place] [facility]) were discussed and offered to patient and she agrees to start the journey:  A. Whole Foods, Plant-based plate comprising of fruits and vegetables, plant-based proteins, whole-grain carbohydrates was discussed in detail with the patient.   A list for source of those nutrients were also provided to the patient.  Patient will use only water or unsweetened tea for hydration. B.  The need to stay away from risky substances including alcohol, smoking; obtaining 7 to 9 hours of restorative sleep, at least 150 minutes of moderate intensity exercise weekly,  the importance of healthy social connections,  and stress reduction techniques were discussed. C.  A full color page of  Calorie density of various food groups per pound showing examples of each food groups was provided to the patient.  - Nutritional counseling repeated at each appointment due to patients tendency to fall back in to old habits.  - The patient admits there is a room for improvement in their diet and drink choices. -  Suggestion is made for the patient to avoid simple carbohydrates from their diet including Cakes, Sweet Desserts / Pastries, Ice Cream, Soda (diet and regular), Sweet Tea, Candies, Chips, Cookies, Sweet Pastries, Store Bought Juices, Alcohol in Excess of 1-2 drinks a day, Artificial Sweeteners, Coffee Creamer, and "Sugar-free" Products. This will help patient to have stable blood glucose profile and potentially avoid unintended weight gain.   - I encouraged the patient to switch to unprocessed or minimally processed  complex starch and increased protein intake (animal or plant source), fruits, and vegetables.   - Patient is advised to stick to a routine mealtimes to eat 3 meals a day and avoid unnecessary snacks (to snack only to correct hypoglycemia).  - she sees Courtney Hale, RDN, CDE for diabetes education.  - I have approached her with the following individualized plan to manage her diabetes and patient agrees:   -She is advised to formally stop her Semglee all together at this time.  She can continue Metformin 850 mg po twice daily for now and start Ozempic 0.25 mg SQ weekly x 2 then increase to 0.5 mg weekly thereafter if tolerated well (samples provided from office).  She does not have personal or family history of thyroid cancer, no history of pancreatitis, does not smoke, therefore she is a good candidate for this medication.  This will also likely help her to stay off insulin and come off Metformin in the future as well.    -she is encouraged to continue monitoring glucose at least twice daily (using her CGM), before breakfast and before bed, to log their readings on the clinic sheets provided, and to call the clinic if she has readings less than 70 or above 300 for 3 tests in a row.  - Adjustment parameters are given to her for hypo and hyperglycemia in writing.  - She is allergic to sulfa meds.  - Specific targets for  A1c; LDL, HDL, and Triglycerides were discussed with the patient.  2) Blood Pressure /Hypertension:  her blood pressure is controlled to target.   she is advised to continue her current medications including Losartan 100 mg p.o. daily with breakfast and HCTZ 25 mg po daily.  3) Lipids/Hyperlipidemia:    Review of her recent lipid panel from 09/25/21 showed uncontrolled LDL at 168 and slightly elevated triglycerides of 169.  She is not currently on any lipid lowering medications.   4)  Weight/Diet:  her Body mass index is 29.48 kg/m.  -  clearly complicating her diabetes care.    she is a candidate for weight loss. I discussed with her the fact that loss of 5 - 10% of her  current body weight will have the most impact on her diabetes management.  Exercise, and detailed carbohydrates information provided  -  detailed on discharge instructions.  5) Chronic Care/Health Maintenance: -she is on ACEI/ARB and not on Statin medications and is encouraged to initiate and continue to follow up with Ophthalmology, Dentist, Podiatrist at least yearly or according to recommendations, and advised to stay  away from smoking. I have recommended yearly flu vaccine and pneumonia vaccine at least every 5 years; moderate intensity exercise for up to 150 minutes weekly; and sleep for at least 7 hours a day.  - she is advised to maintain close follow up with Coral Spikes, DO for primary care needs, as well as her other providers for optimal and coordinated care.      I spent  42  minutes in the care of the patient today including review of labs from [facility], Lipids, Thyroid Function, Hematology (current and previous including abstractions from other facilities); face-to-face time discussing  her blood glucose readings/logs, discussing hypoglycemia and hyperglycemia episodes and symptoms, medications doses, her options of short and long term treatment based on the latest standards of care / guidelines;  discussion about incorporating lifestyle medicine;  and documenting the encounter. Risk reduction counseling performed per USPSTF guidelines to reduce obesity and cardiovascular risk factors.     Please refer to Patient Instructions for Blood Glucose Monitoring and Insulin/Medications Dosing Guide"  in media tab for additional information. Please  also refer to " Patient Self Inventory" in the Media  tab for reviewed elements of pertinent patient history.  Courtney Hale participated in the discussions, expressed understanding, and voiced agreement with the above plans.  All questions were answered to  her satisfaction. she is encouraged to contact clinic should she have any questions or concerns prior to her return visit.     Follow up plan: - Return in about 3 months (around 02/27/2023) for Diabetes F/U with A1c in office, Previsit labs, Bring meter and logs.   Rayetta Pigg, [facility] [address] [place], [place] [address] Phone: [phone] Fax: [phone]  11/29/2022, 3:04 PM

## 2022-12-03 ENCOUNTER — Other Ambulatory Visit: Payer: Self-pay | Admitting: Pulmonary Disease

## 2022-12-03 DIAGNOSIS — D869 Sarcoidosis, unspecified: Secondary | ICD-10-CM

## 2022-12-03 DIAGNOSIS — D849 Immunodeficiency, unspecified: Secondary | ICD-10-CM

## 2022-12-03 DIAGNOSIS — Z79899 Other long term (current) drug therapy: Secondary | ICD-10-CM

## 2022-12-13 ENCOUNTER — Telehealth: Payer: Self-pay | Admitting: *Deleted

## 2022-12-13 NOTE — Telephone Encounter (Signed)
Patient called and stated she needs a refill on her Hydrocodone to [place] in [place]  Last seen 09/2022

## 2022-12-14 ENCOUNTER — Other Ambulatory Visit: Payer: Self-pay | Admitting: Family Medicine

## 2022-12-14 MED ORDER — HYDROCODONE-ACETAMINOPHEN 5-325 MG PO TABS: 1.0000 | ORAL_TABLET | Freq: Two times a day (BID) | ORAL | 0 refills | Status: DC | PRN

## 2022-12-14 NOTE — Telephone Encounter (Signed)
Patient aware.

## 2022-12-14 NOTE — Telephone Encounter (Signed)
Coral Spikes, DO     Rx refilled.

## 2022-12-15 ENCOUNTER — Encounter: Payer: Self-pay | Admitting: Internal Medicine

## 2022-12-15 ENCOUNTER — Ambulatory Visit (INDEPENDENT_AMBULATORY_CARE_PROVIDER_SITE_OTHER): Payer: Medicaid Other | Admitting: Internal Medicine

## 2022-12-15 VITALS — BP 105/73 | HR 96 | Temp 97.8°F | Ht 63.5 in | Wt 166.6 lb

## 2022-12-15 DIAGNOSIS — K582 Mixed irritable bowel syndrome: Secondary | ICD-10-CM | POA: Diagnosis not present

## 2022-12-15 DIAGNOSIS — R1013 Epigastric pain: Secondary | ICD-10-CM | POA: Diagnosis not present

## 2022-12-15 DIAGNOSIS — K219 Gastro-esophageal reflux disease without esophagitis: Secondary | ICD-10-CM

## 2022-12-15 DIAGNOSIS — G8929 Other chronic pain: Secondary | ICD-10-CM

## 2022-12-15 DIAGNOSIS — K8681 Exocrine pancreatic insufficiency: Secondary | ICD-10-CM | POA: Diagnosis not present

## 2022-12-15 DIAGNOSIS — K625 Hemorrhage of anus and rectum: Secondary | ICD-10-CM | POA: Diagnosis not present

## 2022-12-15 NOTE — Progress Notes (Signed)
Referring Provider: Coral Spikes, DO Primary Care Physician:  Coral Spikes, DO Primary GI:  Dr. Abbey Chatters  Chief Complaint  Patient presents with   Follow-up    Patient here today for a follow up visit on her IBS Mix, She says she is having abdominal,and constipation alternating with diarrhea.She has not been taking her bentyl. Has seen Dr. Arnoldo Morale and some Surgeons at [facility] whom both refused to remove it as they feel she would have too many complications. Patient is also seeing bright red blood in stools. She says she is on Humira Q 14 days due to sarcoidosis.     HPI:   Courtney Hale is a 57 y.o. female who presents to the clinic today for follow up visit.  Complicated past medical history including sarcoidosis along with bone metastases currently on methotrexate, GERD, dysphagia, epigastric/right upper quadrant pain, nausea, weight loss, early satiety, IBS, biliary dyskinesia, pancreatic lesion.  Previous workup:  Celiac testing WNL.  MRI/MRCP 08/13/2022 hepatic steatosis, subcentimeter lesion in the pancreatic neck with recommended repeat CT in 6 months, small benign hepatic hemangioma.  HIDA scan 07/27/2022 with biliary dyskinesia.  CT abdomen pelvis with contrast 07/21/2022 with lesion in the pancreatic neck, stable hemangioma of the liver.  RUQ ultrasound 6/26 with hepatic steatosis, gallbladder and CBD normal.   Gastric emptying study 6/27 with normal exam.   EGD 05/03/2022: Normal esophagus s/p empiric dilation, gastritis biopsied.  Pathology with mild chronic, focally active gastritis, negative for H. pylori.  Colonoscopy 11/05/21: Nonbleeding internal hemorrhoids, otherwise normal exam.  Recommended 10-year screening colonoscopy.    EGD 11/05/21: Gastritis biopsied, otherwise normal exam.  Pathology with mild chronic gastritis, negative for H. pylori.  Currently following with [facility] rheumatology.  On Humira for sarcoidosis.  Met with Dr. Arnoldo Morale for biliary  dyskinesia, abnormal HIDA scan.  Felt like patient will need to be seen at tertiary care center for cholecystectomy given her underlying lung disease.  Met with surgeon at [facility] who recommended repeat pancreatic imaging and to follow-up after.  Fecal elastase low, given samples of Zenpep which she states is helping.  Diarrhea resolved.  Actually having constipation now.  Having bowel movements once every 3 to 4 days.  No straining.  Some abdominal discomfort related to this.  Continues to have intermittent rectal bleeding related to internal hemorrhoids    Past Medical History:  Diagnosis Date   Cancer [facility])    Skin   Complication of anesthesia    woke up during endoscopy and colonoscopy   Depression    Diabetes ([place])    GERD (gastroesophageal reflux disease)    Hemorrhoids    History of esophageal stricture    2011  peptic w/  dilatation   History of gastritis    2011   History of melanoma excision    2015  left leg/   12-09-2015 back    Hypertension    Microhematuria    Migraines    Nephrolithiasis    left side non-obstructive    Right ureteral stone    Sarcoidosis 07/24/2021   Urgency of urination    Wears contact lenses     Past Surgical History:  Procedure Laterality Date   ABDOMINAL HYSTERECTOMY     BALLOON DILATION N/A 05/03/2022   Procedure: BALLOON DILATION;  Surgeon: Eloise Harman, DO;  [facility];  Service: Endoscopy;  Laterality: N/A;   BIOPSY  11/16/2021   Procedure: BIOPSY;  Surgeon: Eloise Harman, DO;  [facility];  Service: Endoscopy;;   BIOPSY  05/03/2022   Procedure: BIOPSY;  Surgeon: Eloise Harman, DO;  [facility];  Service: Endoscopy;;   CARPAL TUNNEL RELEASE Right 10/29/2013   COLONOSCOPY N/A 08/27/2015   Procedure: COLONOSCOPY;  Surgeon: Danie Binder, MD;  [facility]: [facility];  Service: Endoscopy;  Laterality: N/A;  0830   COLONOSCOPY WITH PROPOFOL N/A 11/16/2021   Surgeon: Eloise Harman, DO;  Nonbleeding internal hemorrhoids, otherwise normal exam.  Recommended 10-year screening colonoscopy.   CYST EXCISION  10/25/2010   right hand   CYSTOSCOPY W/ URETERAL STENT PLACEMENT Right 12/22/2015   Procedure: CYSTOSCOPY WITH RETROGRADE PYELOGRAM/URETERAL STENT PLACEMENT;  Surgeon: Nickie Retort, MD;  Location: [facility];  Service: Urology;  Laterality: Right;   CYSTOSCOPY WITH RETROGRADE PYELOGRAM, URETEROSCOPY AND STENT PLACEMENT Right 01/05/2016   Procedure: CYSTOSCOPY WITH RETROGRADE PYELOGRAM, URETEROSCOPY AND STENT REPLACEMENT;  Surgeon: Nickie Retort, MD;  Location: [facility];  Service: Urology;  Laterality: Right;   DILATION AND CURETTAGE OF UTERUS  03/12/2009   w/  Suction   double balloon enteroscopy     Dr. Arsenio Loader at [facility]: no erosions, no evidence of Crohn's disease, path without Crohn's.    EGD with push enteroscopy  02/12/2010    patent distal peptic stricture with diffuse antral erythema, normal D1 and D2    ESOPHAGOGASTRODUODENOSCOPY (EGD) WITH PROPOFOL N/A 11/16/2021   Surgeon: Eloise Harman, DO;   Gastritis biopsied, otherwise normal exam.  Pathology with mild chronic gastritis, negative for H. pylori.   ESOPHAGOGASTRODUODENOSCOPY (EGD) WITH PROPOFOL N/A 05/03/2022   Surgeon: Eloise Harman, DO;  Normal esophagus s/p empiric dilation, gastritis biopsied.  Pathology with mild chronic, focally active gastritis, negative for H. pylori.   HAND SURGERY     LAPAROSCOPIC ASSISTED VAGINAL HYSTERECTOMY  10/26/2014   w/  Bilateral Salpingoophorectomy   STONE EXTRACTION WITH BASKET Right 01/05/2016   Procedure: STONE EXTRACTION WITH BASKET;  Surgeon: Nickie Retort, MD;  Location: [facility];  Service: Urology;  Laterality: Right;   TONSILLECTOMY  1998  approx    Current Outpatient Medications  Medication Sig Dispense Refill   albuterol (VENTOLIN HFA) 108 (90 Base) MCG/ACT inhaler Inhale 2 puffs into the lungs  every 6 (six) hours as needed for wheezing or shortness of breath. 8 g 6   BD PEN NEEDLE NANO 2ND GEN 32G X 4 MM MISC USE AS DIRECTED 100 each 0   busPIRone (BUSPAR) 5 MG tablet TAKE 1 TABLET BY MOUTH THREE TIMES DAILY 90 tablet 1   calcium carbonate (TUMS - DOSED IN MG ELEMENTAL CALCIUM) 500 MG chewable tablet Chew 1,500 mg by mouth 2 (two) times daily as needed for indigestion or heartburn.     Carboxymethylcellul-Glycerin (LUBRICATING EYE DROPS OP) Place 1 drop into both eyes daily as needed (dry eyes).     Chromium 1000 MCG TABS Take 1,000 mcg by mouth daily.     CINNAMON PO Take 2,000 mg by mouth daily.     Continuous Blood Gluc Receiver (FREESTYLE LIBRE 2 READER) DEVI Use as directed to check blood sugars. 1 each 0   Continuous Blood Gluc Sensor (FREESTYLE LIBRE 2 SENSOR) MISC Use sensor as directed. Change every 10-14 days. 4 each 3   Cyanocobalamin (B-12) 2000 MCG TABS Take 2,000 mcg by mouth daily.     EPINEPHrine 0.3 mg/0.3 mL IJ SOAJ injection Inject 0.3 mg into the muscle as needed for  anaphylaxis. 1 each 0   folic acid (FOLVITE) 1 MG tablet TAKE 1 TABLET BY MOUTH IN THE MORNING AND AT BEDTIME 90 tablet 1   hydrochlorothiazide (HYDRODIURIL) 25 MG tablet Take 1 tablet (25 mg total) by mouth daily. 90 tablet 1   HYDROcodone-acetaminophen (NORCO/VICODIN) 5-325 MG tablet Take 1-2 tablets by mouth 2 (two) times daily as needed for moderate pain or severe pain. 90 tablet 0   lansoprazole (PREVACID) 30 MG capsule Take 1 capsule (30 mg total) by mouth 2 (two) times daily before a meal. 60 capsule 3   losartan (COZAAR) 100 MG tablet Take 1 tablet by mouth once daily 90 tablet 0   metFORMIN (GLUCOPHAGE) 850 MG tablet TAKE 1 TABLET BY MOUTH TWICE DAILY WITH A MEAL 180 tablet 0   methotrexate (RHEUMATREX) 2.5 MG tablet TAKE 8 TABLETS BY MOUTH ONCE A WEEK 32 tablet 0   PARoxetine (PAXIL) 10 MG tablet Take 1 tablet (10 mg total) by mouth daily. 90 tablet 1   PARoxetine (PAXIL) 40 MG tablet TAKE 1  TABLET BY MOUTH ONCE DAILY IN THE MORNING 90 tablet 0   Probiotic Product (PROBIOTIC PO) Take 1 capsule by mouth daily.     prochlorperazine (COMPAZINE) 10 MG tablet Take 1 tablet (10 mg total) by mouth every 6 (six) hours as needed for nausea or vomiting. 30 tablet 2   rizatriptan (MAXALT-MLT) 10 MG disintegrating tablet Take 1 tablet (10 mg total) by mouth as needed for migraine. May repeat in 2 hours if needed. Max 2 per 24 hours 10 tablet 1   scopolamine (TRANSDERM-SCOP) 1 MG/3DAYS Place 1 patch (1.5 mg total) onto the skin every 3 (three) days. 10 patch 12   Semaglutide,0.25 or 0.5MG/DOS, (OZEMPIC, 0.25 OR 0.5 MG/DOSE,) 2 MG/1.5ML SOPN Inject 0.5 mg into the skin once a week. 3 mL 3   sucralfate (CARAFATE) 1 g tablet Take 1 tablet (1 g total) by mouth 4 (four) times daily -  with meals and at bedtime. 120 tablet 1   traZODone (DESYREL) 150 MG tablet Take 1 tablet (150 mg total) by mouth at bedtime as needed for sleep. 90 tablet 3   valACYclovir (VALTREX) 500 MG tablet Take 500 mg by mouth 2 (two) times daily as needed (outbreaks).     dicyclomine (BENTYL) 10 MG capsule Take 1 capsule (10 mg total) by mouth up to 4 (four) times daily (before meals and at bedtime) for diarrhea and abdominal cramping. (Patient not taking: Reported on 12/15/2022) 90 capsule 0   No current facility-administered medications for this visit.    Allergies as of 12/15/2022 - Review Complete 12/15/2022  Allergen Reaction Noted   Topamax [topiramate] Other (See Comments) 12/18/2016   Fluorouracil Swelling 11/05/2021   Sulfa antibiotics Other (See Comments) 12/05/2012    Family History  Problem Relation Age of Onset   Hypertension Mother    Thyroid disease Mother    Diabetes Mother    Hyperlipidemia Mother    Stroke Mother    Heart disease Mother    Hyperlipidemia Father    Hypertension Father    Heart attack Father    Heart failure Father    Colon cancer Neg Hx     Social History   Socioeconomic History    Marital status: Single    Spouse name: Not on file   Number of children: Not on file   Years of education: Not on file   Highest education level: Not on file  Occupational History   Not  on file  Tobacco Use   Smoking status: Never    Passive exposure: Past   Smokeless tobacco: Never  Vaping Use   Vaping Use: Never used  Substance and Sexual Activity   Alcohol use: Not Currently   Drug use: No   Sexual activity: Not on file  Other Topics Concern   Not on file  Social History Narrative   Not on file   Social Determinants of Health   Financial Resource Strain: Low Risk  (06/30/2021)   Overall Financial Resource Strain (CARDIA)    Difficulty of Paying Living Expenses: Not hard at all  Food Insecurity: No Food Insecurity (06/30/2021)   Hunger Vital Sign    Worried About Running Out of Food in the Last Year: Never true    [place] in the Last Year: Never true  Transportation Needs: No Transportation Needs (06/30/2021)   PRAPARE - Hydrologist (Medical): No    Lack of Transportation (Non-Medical): No  Physical Activity: Inactive (06/30/2021)   Exercise Vital Sign    Days of Exercise per Week: 0 days    Minutes of Exercise per Session: 0 min  Stress: Stress Concern Present (06/30/2021)   [place]    Feeling of Stress : Very much  Social Connections: Socially Isolated (06/30/2021)   Social Connection and Isolation Panel [NHANES]    Frequency of Communication with Friends and Family: More than three times a week    Frequency of Social Gatherings with Friends and Family: More than three times a week    Attends Religious Services: Never    Marine scientist or Organizations: No    Attends Archivist Meetings: Never    Marital Status: Never married    Subjective: Review of Systems  Constitutional:  Negative for chills and fever.  HENT:  Negative for congestion and  hearing loss.   Eyes:  Negative for blurred vision and double vision.  Respiratory:  Negative for cough and shortness of breath.   Cardiovascular:  Negative for chest pain and palpitations.  Gastrointestinal:  Positive for abdominal pain, diarrhea, heartburn and nausea. Negative for blood in stool, constipation, melena and vomiting.  Genitourinary:  Negative for dysuria and urgency.  Musculoskeletal:  Negative for joint pain and myalgias.  Skin:  Negative for itching and rash.  Neurological:  Negative for dizziness and headaches.  Psychiatric/Behavioral:  Negative for depression. The patient is not nervous/anxious.      Objective: BP 105/73 (BP Location: Left Arm, Patient Position: Sitting, Cuff Size: Normal)   Pulse 96   Temp 97.8 F (36.6 C) (Temporal)   Ht 5' 3.5" (1.613 m)   Wt 166 lb 9.6 oz (75.6 kg)   LMP 10/26/2014   BMI 29.05 kg/m  Physical Exam Constitutional:      Appearance: Normal appearance.  HENT:     Head: Normocephalic and atraumatic.  Eyes:     Extraocular Movements: Extraocular movements intact.     Conjunctiva/sclera: Conjunctivae normal.  Cardiovascular:     Rate and Rhythm: Normal rate and regular rhythm.  Pulmonary:     Effort: Pulmonary effort is normal.     Breath sounds: Normal breath sounds.  Abdominal:     General: Bowel sounds are normal.     Palpations: Abdomen is soft.  Musculoskeletal:        General: No swelling. Normal range of motion.     Cervical back: Normal  range of motion and neck supple.  Skin:    General: Skin is warm and dry.     Coloration: Skin is not jaundiced.  Neurological:     General: No focal deficit present.     Mental Status: She is alert and oriented to person, place, and time.  Psychiatric:        Mood and Affect: Mood normal.        Behavior: Behavior normal.      Assessment: *Chronic nausea *Right upper quadrant/epigastric pain *Biliary dyskinesia *Exocrine pancreatic insufficiency *Chronic  GERD *Irritable bowel syndrome *Pancreatic neck lesion  Plan: Patient with numerous GI complaints for me today, complicated past medical history.  Likely combination of multiple GI issues.  Will order CT abdomen pelvis pancreas protocol today.  Continue to follow-up with surgeon at [facility].  Continue on lansoprazole twice daily for chronic GERD.  Continue Compazine for nausea.  Continue on Zenpep for EPI.   Diarrhea resolved, now having constipation.   For patient's constipation, I recommended taking MiraLAX 1 capful daily.  If this is not adequate then increase to 2 capfuls daily.  If this is still not adequate then add on once daily Dulcolax.  Also recommended patient start taking fiber therapy.  Print out given to patient today.  Encouraged to drink at least 6 glasses of water daily.  Consider hemorrhoid banding.  Discuss further at follow-up visit.  Follow-up in 2 to 3 months.    12/15/2022 3:04 PM   Disclaimer: This note was dictated with voice recognition software. Similar sounding words can inadvertently be transcribed and may not be corrected upon review.

## 2022-12-15 NOTE — Patient Instructions (Signed)
I will order CT of your pancreas today.  We will call with results.  Continue to follow-up with surgery at [facility].  Continue on Zenpep for pancreatic insufficiency.  Exocrine Pancreatic Insufficiency (EPI) is a condition in which your body doesn't provide enough pancreatic enzymes to properly digest your food (which can sometimes lead to some unpleasant digestive symptoms).  For many people, EPI is also a chronic lifelong condition.  That's why its so important to know what to expect with your EPI treatment, because with the right plan in place, EPI is manageable.  HOW DO I TAKE PANCREATIC ENZYMES?  Pancreatic Enzyme Replacement therapy (Zenpep) is only available through prescription and cannot be substituted with over the counter alternatives.  Your doctor will personalize your dose based on your weight, diet, and symptoms. The number of capsules you take per meal will depend on your prescribed dose.  Zenpep must be taken DURING every meal and snack.   Whether its a full meal or a snack, take Zenpep every time you eat. Remember to follow your treatment plan closely and take Zenpep exactly as prescribed - consistency is key!!  For your constipation, I want you to start taking over the counter MiraLAX 1 capful daily.  If this does not adequately control your constipation, I would increase to 2 capfuls daily.  If this is still not adequate, then I would add on once daily Dulcolax (bisacodyl) tablet.   I also recommend increasing fiber in your diet or adding OTC Benefiber/Metamucil. Be sure to drink at least 4 to 6 glasses of water daily.   Continue lansoprazole twice daily for your chronic reflux.  Follow-up in 2 to 3 months.  It was very nice seeing you again today.  Dr. Abbey Chatters

## 2022-12-16 ENCOUNTER — Other Ambulatory Visit (INDEPENDENT_AMBULATORY_CARE_PROVIDER_SITE_OTHER): Payer: Self-pay | Admitting: *Deleted

## 2022-12-16 ENCOUNTER — Telehealth (INDEPENDENT_AMBULATORY_CARE_PROVIDER_SITE_OTHER): Payer: Self-pay | Admitting: *Deleted

## 2022-12-16 ENCOUNTER — Encounter (INDEPENDENT_AMBULATORY_CARE_PROVIDER_SITE_OTHER): Payer: Self-pay | Admitting: *Deleted

## 2022-12-16 DIAGNOSIS — K8681 Exocrine pancreatic insufficiency: Secondary | ICD-10-CM

## 2022-12-16 NOTE — Telephone Encounter (Signed)
PA approved via [employer]lon Order ID: [id number]      Approval Valid Through: 12/16/2022 - 02/13/2023

## 2022-12-23 ENCOUNTER — Encounter: Payer: Self-pay | Admitting: Radiology

## 2022-12-23 ENCOUNTER — Telehealth: Payer: Self-pay | Admitting: Nurse Practitioner

## 2022-12-23 MED ORDER — FREESTYLE LIBRE 2 SENSOR MISC: 3 refills | Status: DC

## 2022-12-23 NOTE — Telephone Encounter (Signed)
Rx sent 

## 2022-12-23 NOTE — Telephone Encounter (Signed)
Pt stated that her freestyle Elenor Legato would need to be called in by Rayetta Pigg instead of Dr. Lacinda Axon.  She stated the pharmacy told her she would need a PA too for it.

## 2022-12-27 ENCOUNTER — Other Ambulatory Visit: Payer: Self-pay | Admitting: Family Medicine

## 2022-12-27 DIAGNOSIS — Z1231 Encounter for screening mammogram for malignant neoplasm of breast: Secondary | ICD-10-CM

## 2022-12-29 DIAGNOSIS — D8689 Sarcoidosis of other sites: Secondary | ICD-10-CM | POA: Diagnosis not present

## 2022-12-29 DIAGNOSIS — M797 Fibromyalgia: Secondary | ICD-10-CM | POA: Diagnosis not present

## 2022-12-29 DIAGNOSIS — D849 Immunodeficiency, unspecified: Secondary | ICD-10-CM | POA: Diagnosis not present

## 2022-12-29 DIAGNOSIS — D860 Sarcoidosis of lung: Secondary | ICD-10-CM | POA: Diagnosis not present

## 2022-12-31 ENCOUNTER — Other Ambulatory Visit: Payer: Self-pay | Admitting: Pulmonary Disease

## 2022-12-31 DIAGNOSIS — D849 Immunodeficiency, unspecified: Secondary | ICD-10-CM

## 2022-12-31 DIAGNOSIS — D869 Sarcoidosis, unspecified: Secondary | ICD-10-CM

## 2022-12-31 DIAGNOSIS — Z79899 Other long term (current) drug therapy: Secondary | ICD-10-CM

## 2023-01-03 ENCOUNTER — Other Ambulatory Visit: Payer: Self-pay | Admitting: Family Medicine

## 2023-01-03 ENCOUNTER — Telehealth: Payer: Self-pay | Admitting: *Deleted

## 2023-01-03 DIAGNOSIS — F419 Anxiety disorder, unspecified: Secondary | ICD-10-CM

## 2023-01-03 NOTE — Telephone Encounter (Signed)
Patient called the Telephone Advice/Access Nurse over the weekend. Her Libre sensors need a PA. Patient states that she has been running low and really needs these to help her keep up with her blood sugars.  Patient was advised that this would be addressed with the RX PA team.

## 2023-01-04 ENCOUNTER — Other Ambulatory Visit (HOSPITAL_COMMUNITY): Payer: Self-pay

## 2023-01-04 ENCOUNTER — Telehealth: Payer: Self-pay | Admitting: Pharmacy Technician

## 2023-01-04 NOTE — Telephone Encounter (Signed)
Please advise on refill request

## 2023-01-04 NOTE — Telephone Encounter (Signed)
Pharmacy Patient Advocate Encounter  Prior Authorization for [employer] 2 sensors has been approved by [employer] (ins).    PA # [id number] Effective dates: 01/04/23 through 01/04/24  Spoke with Pharmacy to process. Copay is $0

## 2023-01-04 NOTE — Telephone Encounter (Addendum)
Pharmacy Patient Advocate Encounter   Received notification from Pt calls msgs/LPN that prior authorization for Freestyle LIbre 2 sensors is required/requested.   PA submitted on 01/04/23 to (ins) [place] Medicaid via [employer] or ([place]) confirmation # Rosalene Billings PA Case ID: [id number] Status is pending

## 2023-01-04 NOTE — Telephone Encounter (Signed)
PA requested

## 2023-01-04 NOTE — Telephone Encounter (Signed)
Both the patient and the pharmacy was called and made aware. The pharmacy stated that they would have this ready for the patient tomorrow. Patient was called back and made aware.

## 2023-01-11 ENCOUNTER — Other Ambulatory Visit: Payer: Self-pay | Admitting: Family Medicine

## 2023-01-11 DIAGNOSIS — F419 Anxiety disorder, unspecified: Secondary | ICD-10-CM

## 2023-01-13 NOTE — Telephone Encounter (Signed)
Please schedule patient for follow-up with me. Last appointment was 6 months ago. No further methotrexate refills until she is seen in clinic.

## 2023-01-14 ENCOUNTER — Ambulatory Visit: Payer: Medicaid Other | Admitting: Family Medicine

## 2023-01-14 VITALS — BP 109/77 | HR 76 | Temp 97.4°F | Ht 63.5 in | Wt 161.0 lb

## 2023-01-14 DIAGNOSIS — R519 Headache, unspecified: Secondary | ICD-10-CM

## 2023-01-14 DIAGNOSIS — I10 Essential (primary) hypertension: Secondary | ICD-10-CM

## 2023-01-14 DIAGNOSIS — R52 Pain, unspecified: Secondary | ICD-10-CM

## 2023-01-14 DIAGNOSIS — E119 Type 2 diabetes mellitus without complications: Secondary | ICD-10-CM | POA: Diagnosis not present

## 2023-01-14 MED ORDER — HYDROCODONE-ACETAMINOPHEN 5-325 MG PO TABS: 1.0000 | ORAL_TABLET | Freq: Two times a day (BID) | ORAL | 0 refills | Status: DC | PRN

## 2023-01-14 NOTE — Patient Instructions (Signed)
Referral is in.  Continue your medications.  Follow up in 3 months.

## 2023-01-16 DIAGNOSIS — R519 Headache, unspecified: Secondary | ICD-10-CM | POA: Insufficient documentation

## 2023-01-16 NOTE — Assessment & Plan Note (Signed)
Stable.  Continue current medications.

## 2023-01-16 NOTE — Assessment & Plan Note (Signed)
Stable. Medication refilled. [place] controlled substance database reviewed.

## 2023-01-16 NOTE — Assessment & Plan Note (Signed)
Uncertain etiology. Referring to neurology.

## 2023-01-16 NOTE — Assessment & Plan Note (Signed)
Stable. Continue Losartan and HCTZ.

## 2023-01-16 NOTE — Progress Notes (Signed)
Subjective:  Patient ID: Courtney Hale, female    DOB: 27-Apr-1966  Age: 57 y.o. MRN: [id number]  CC: Chief Complaint  Patient presents with   3 month follow up    HPI:  57 year old female with HTN, Migraine Headache, Sarcoidosis, chronic pain (? Fibromyalgia), Type 2 Diabetes presents for follow up.  Patient now following with [facility] regarding Sarcoidosis. There is concern about fibromyalgia in regards to her chronic pain.    She continues to have significant MSK pain (some of which is due to bone involvement of Sarcoid.  She takes 1-2 tablets of Norco 5/325 twice daily as needed for pain. Pain is stable on the medication except for ongoing occipital pain.   HTN is stable on losartan and HCTZ.  DM-2 is currently well controlled on Metformin and Ozempic.  Patient Active Problem List   Diagnosis Date Noted   Occipital headache 01/16/2023   Pancreatic lesion 10/14/2022   Hepatic steatosis 10/14/2022   Biliary dyskinesia 10/14/2022   DM (diabetes mellitus), type 2 ([place]) 07/15/2022   Insomnia 07/15/2022   Encounter for pain management 03/03/2022   High risk medication use 01/06/2022   Immunosuppression ([place]) 11/16/2021   Sarcoidosis 08/14/2021   Gastroesophageal reflux disease without esophagitis 07/27/2017   Anxiety and depression 12/18/2016   IBS (irritable bowel syndrome) 03/18/2016   Melanoma of skin ([place]) 03/18/2016   Headache, chronic migraine without aura 01/31/2013   Hypertension 01/31/2013    Social Hx   Social History   Socioeconomic History   Marital status: Single    Spouse name: Not on file   Number of children: Not on file   Years of education: Not on file   Highest education level: Not on file  Occupational History   Not on file  Tobacco Use   Smoking status: Never    Passive exposure: Past   Smokeless tobacco: Never  Vaping Use   Vaping Use: Never used  Substance and Sexual Activity   Alcohol use: Not Currently   Drug use: No    Sexual activity: Not on file  Other Topics Concern   Not on file  Social History Narrative   Not on file   Social Determinants of Health   Financial Resource Strain: Low Risk  (06/30/2021)   Overall Financial Resource Strain (CARDIA)    Difficulty of Paying Living Expenses: Not hard at all  Food Insecurity: No Food Insecurity (06/30/2021)   Hunger Vital Sign    Worried About Running Out of Food in the Last Year: Never true    [place] in the Last Year: Never true  Transportation Needs: No Transportation Needs (06/30/2021)   PRAPARE - Hydrologist (Medical): No    Lack of Transportation (Non-Medical): No  Physical Activity: Inactive (06/30/2021)   Exercise Vital Sign    Days of Exercise per Week: 0 days    Minutes of Exercise per Session: 0 min  Stress: Stress Concern Present (06/30/2021)   [place]    Feeling of Stress : Very much  Social Connections: Socially Isolated (06/30/2021)   Social Connection and Isolation Panel [NHANES]    Frequency of Communication with Friends and Family: More than three times a week    Frequency of Social Gatherings with Friends and Family: More than three times a week    Attends Religious Services: Never    Marine scientist or Organizations: No  Attends Archivist Meetings: Never    Marital Status: Never married    Review of Systems Per HPI  Objective:  BP 109/77   Pulse 76   Temp (!) 97.4 F (36.3 C)   Ht 5' 3.5" (1.613 m)   Wt 161 lb (73 kg)   LMP 10/26/2014   SpO2 98%   BMI 28.07 kg/m      01/14/2023   10:44 AM 12/15/2022    2:55 PM 11/29/2022    1:37 PM  BP/Weight  Systolic BP 0000000 [id number] [id number]  Diastolic BP 77 73 71  Wt. (Lbs) 161 166.6 166.4  BMI 28.07 kg/m2 29.05 kg/m2 29.48 kg/m2    Physical Exam Vitals and nursing note reviewed.  Constitutional:      General: She is not in acute distress.    Appearance: Normal  appearance.  HENT:     Head: Normocephalic and atraumatic.  Eyes:     General:        Right eye: No discharge.        Left eye: No discharge.     Conjunctiva/sclera: Conjunctivae normal.  Cardiovascular:     Rate and Rhythm: Normal rate and regular rhythm.  Pulmonary:     Effort: Pulmonary effort is normal.     Breath sounds: Normal breath sounds. No wheezing, rhonchi or rales.  Neurological:     Mental Status: She is alert.  Psychiatric:        Mood and Affect: Mood normal.        Behavior: Behavior normal.     Lab Results  Component Value Date   WBC 6.0 08/18/2022   HGB 13.1 08/18/2022   HCT 38.0 08/18/2022   PLT 313 08/18/2022   GLUCOSE 143 (H) 08/18/2022   CHOL 258 (H) 09/25/2021   TRIG 169 (H) 09/25/2021   HDL 59 09/25/2021   LDLCALC 168 (H) 09/25/2021   ALT 33 08/18/2022   AST 30 08/18/2022   NA 140 08/18/2022   K 3.5 08/18/2022   CL 106 08/18/2022   CREATININE 0.65 08/18/2022   BUN 11 08/18/2022   CO2 23 08/18/2022   TSH 0.93 07/15/2022   INR 0.9 07/21/2021   HGBA1C 5.8 (A) 11/29/2022   MICROALBUR 10mg /L 07/29/2022     Assessment & Plan:   Problem List Items Addressed This Visit       Cardiovascular and Mediastinum   Hypertension    Stable. Continue Losartan and HCTZ.        Endocrine   DM (diabetes mellitus), type 2 (HCC)    Stable. Continue current medications.        Other   Encounter for pain management    Stable. Medication refilled. [place] controlled substance database reviewed.       Occipital headache - Primary    Uncertain etiology. Referring to neurology.      Relevant Medications   HYDROcodone-acetaminophen (NORCO/VICODIN) 5-325 MG tablet   Other Relevant Orders   Ambulatory referral to Neurology    Meds ordered this encounter  Medications   HYDROcodone-acetaminophen (NORCO/VICODIN) 5-325 MG tablet    Sig: Take 1-2 tablets by mouth 2 (two) times daily as needed for moderate pain or severe pain.    Dispense:  90 tablet     Refill:  0    Follow-up:  Return in about 3 months (around 04/16/2023).  [place]

## 2023-01-21 ENCOUNTER — Ambulatory Visit (HOSPITAL_COMMUNITY)
Admission: RE | Admit: 2023-01-21 | Discharge: 2023-01-21 | Disposition: A | Discharge status: Home / Self Care | Payer: Medicaid Other | Source: Ambulatory Visit | Attending: Internal Medicine | Admitting: Internal Medicine

## 2023-01-21 DIAGNOSIS — K760 Fatty (change of) liver, not elsewhere classified: Secondary | ICD-10-CM | POA: Diagnosis not present

## 2023-01-21 DIAGNOSIS — K8681 Exocrine pancreatic insufficiency: Secondary | ICD-10-CM | POA: Insufficient documentation

## 2023-01-21 LAB — POCT I-STAT CREATININE: Creatinine, Ser: 0.8 mg/dL (ref 0.44–1.00)

## 2023-01-21 MED ORDER — IOHEXOL 300 MG/ML  SOLN
100.0000 mL | Freq: Once | INTRAMUSCULAR | Status: AC | PRN
  Administered 2023-01-21: 100 mL via INTRAVENOUS

## 2023-01-31 DIAGNOSIS — K8020 Calculus of gallbladder without cholecystitis without obstruction: Secondary | ICD-10-CM | POA: Diagnosis not present

## 2023-02-02 ENCOUNTER — Encounter: Payer: Self-pay | Admitting: Neurology

## 2023-02-03 ENCOUNTER — Other Ambulatory Visit: Payer: Self-pay | Admitting: Pulmonary Disease

## 2023-02-03 DIAGNOSIS — Z79899 Other long term (current) drug therapy: Secondary | ICD-10-CM

## 2023-02-03 DIAGNOSIS — D849 Immunodeficiency, unspecified: Secondary | ICD-10-CM

## 2023-02-03 DIAGNOSIS — D869 Sarcoidosis, unspecified: Secondary | ICD-10-CM

## 2023-02-07 ENCOUNTER — Telehealth: Payer: Self-pay

## 2023-02-07 ENCOUNTER — Telehealth: Payer: Self-pay | Admitting: Pulmonary Disease

## 2023-02-07 MED ORDER — ZENPEP 40000-126000 UNITS PO CPEP: 2.0000 | ORAL_CAPSULE | Freq: Three times a day (TID) | ORAL | 3 refills | Status: DC

## 2023-02-07 NOTE — Addendum Note (Signed)
Addended by: Gelene Mink on: 02/07/2023 03:16 PM   Modules accepted: Orders

## 2023-02-07 NOTE — Telephone Encounter (Signed)
Pt phoned and was wanting some samples of Zenpep but we do not have any. She advises if we do not have any please send in a Rx for it to [facility] in [place]. (Dr Marletta Lor out of office this week)

## 2023-02-07 NOTE — Telephone Encounter (Signed)
PT would like a refill of her Methoprexate. Pharm states "Not Auth to Refill"  [facility] in [place]  Also needs Folic Acid refill as well.  PT # is [phone]

## 2023-02-07 NOTE — Telephone Encounter (Signed)
[facility] to [facility]

## 2023-02-08 NOTE — Telephone Encounter (Signed)
JE, please advise.  

## 2023-02-09 NOTE — Telephone Encounter (Signed)
Spk with pt video visit scheduled

## 2023-02-09 NOTE — Telephone Encounter (Signed)
She needs an appointment. In-person or video ok

## 2023-02-10 ENCOUNTER — Telehealth (INDEPENDENT_AMBULATORY_CARE_PROVIDER_SITE_OTHER): Payer: Medicaid Other | Admitting: Pulmonary Disease

## 2023-02-10 DIAGNOSIS — D869 Sarcoidosis, unspecified: Secondary | ICD-10-CM

## 2023-02-10 DIAGNOSIS — Z79899 Other long term (current) drug therapy: Secondary | ICD-10-CM

## 2023-02-10 DIAGNOSIS — D849 Immunodeficiency, unspecified: Secondary | ICD-10-CM

## 2023-02-10 MED ORDER — FOLIC ACID 1 MG PO TABS: ORAL_TABLET | ORAL | 5 refills | Status: DC

## 2023-02-10 MED ORDER — METHOTREXATE SODIUM 2.5 MG PO TABS: 20.0000 mg | ORAL_TABLET | ORAL | 0 refills | Status: DC

## 2023-02-10 NOTE — Progress Notes (Addendum)
Virtual Visit via Video Note  I connected with Courtney Hale on 02/16/23 at 11:15 AM EDT by a video enabled telemedicine application and verified that I am speaking with the correct person using two identifiers.  Location: Patient: Home Provider: [facility]   I discussed the limitations of evaluation and management by telemedicine and the availability of in person appointments. The patient expressed understanding and agreed to proceed.   I discussed the assessment and treatment plan with the patient. The patient was provided an opportunity to ask questions and all were answered. The patient agreed with the plan and demonstrated an understanding of the instructions.   The patient was advised to call back or seek an in-person evaluation if the symptoms worsen or if the condition fails to improve as anticipated.   Subjective:   PATIENT ID: Courtney Hale GENDER: female DOB: Mar 18, 1966, MRN: [id number]   HPI  Chief Complaint  Patient presents with   Follow-up    Medication refill   Reason for Visit: Follow-up  [name] is a 57 year old female with sarcoid with pulmonary and bone involvement, glaucoma, history of melanoma s/p skin resections with DM2 who presents for follow-up sarcoidosis.  Synopsis: She was initially referred to [facility] shortness of breath with exertion and at rest.  She was seen by Dr. Sherene Sires for sarcoid. She has been followed by Dr. Ellin Saba in hematology for multiple bone lesions after presenting for left hip pain since February 2022. PET demonstrated hypermetabolic mediastinal lymphadenopathy and right iliac, right sacral ala and anterior left frontal bone lesions. She underwent bone biopsy of right iliac bone lesion on 07/21/2021 which returned with noncaseating granulomatous inflammation. Dr. Sherene Sires started patient on 20 mg prednisone and transferred to my care in November 2022.  2022 -Started on prednisone 20 mg on October  and increased to 40 mg Dec. Continue to left hip and facial pain. 2023 - Methotrexate started March. Weaned off prednisone April. Referred to [facility] and [facility]  02/10/23 Last visit was 7 months ago. She was last seen by [facility] in 08/2022 and started on Humira with Rheumatology in 10/2022. Also taking methotrexate. Reports unchanged back and "skull and bone" pain. Occasional shortness of breath and fatigue. Activity limited due to this. Does have abdominal that is being managed by GI and surgery.  Past Medical History:  Diagnosis Date   Cancer (HCC)    Skin   Complication of anesthesia    woke up during endoscopy and colonoscopy   Depression    Diabetes (HCC)    GERD (gastroesophageal reflux disease)    Hemorrhoids    History of esophageal stricture    2011  peptic w/  dilatation   History of gastritis    2011   History of melanoma excision    2015  left leg/   12-09-2015 back    Hypertension    Microhematuria    Migraines    Nephrolithiasis    left side non-obstructive    Right ureteral stone    Sarcoidosis 07/24/2021   Urgency of urination    Wears contact lenses      Family History  Problem Relation Age of Onset   Hypertension Mother    Thyroid disease Mother    Diabetes Mother    Hyperlipidemia Mother    Stroke Mother    Heart disease Mother    Hyperlipidemia Father    Hypertension Father    Heart attack Father    Heart failure  Father    Colon cancer Neg Hx      Social History   Occupational History   Not on file  Tobacco Use   Smoking status: Never    Passive exposure: Past   Smokeless tobacco: Never  Vaping Use   Vaping Use: Never used  Substance and Sexual Activity   Alcohol use: Not Currently   Drug use: No   Sexual activity: Not on file    Allergies  Allergen Reactions   Topamax [Topiramate] Other (See Comments)    Developed kidney stones   Fluorouracil Swelling    Burning skin   Sulfa Antibiotics Other (See Comments)    Burning  sensation, flushing to skin     Outpatient Medications Prior to Visit  Medication Sig Dispense Refill   albuterol (VENTOLIN HFA) 108 (90 Base) MCG/ACT inhaler Inhale 2 puffs into the lungs every 6 (six) hours as needed for wheezing or shortness of breath. 8 g 6   BD PEN NEEDLE NANO 2ND GEN 32G X 4 MM MISC USE AS DIRECTED 100 each 0   busPIRone (BUSPAR) 5 MG tablet TAKE 1 TABLET BY MOUTH THREE TIMES DAILY 90 tablet 1   calcium carbonate (TUMS - DOSED IN MG ELEMENTAL CALCIUM) 500 MG chewable tablet Chew 1,500 mg by mouth 2 (two) times daily as needed for indigestion or heartburn.     Carboxymethylcellul-Glycerin (LUBRICATING EYE DROPS OP) Place 1 drop into both eyes daily as needed (dry eyes).     Chromium 1000 MCG TABS Take 1,000 mcg by mouth daily.     CINNAMON PO Take 2,000 mg by mouth daily.     Continuous Blood Gluc Receiver (FREESTYLE LIBRE 2 READER) DEVI Use as directed to check blood sugars. 1 each 0   Continuous Blood Gluc Sensor (FREESTYLE LIBRE 2 SENSOR) MISC Use sensor as directed. Change every 10-14 days. 2 each 3   Cyanocobalamin (B-12) 2000 MCG TABS Take 2,000 mcg by mouth daily.     dicyclomine (BENTYL) 10 MG capsule Take 1 capsule (10 mg total) by mouth up to 4 (four) times daily (before meals and at bedtime) for diarrhea and abdominal cramping. 90 capsule 0   hydrochlorothiazide (HYDRODIURIL) 25 MG tablet Take 1 tablet (25 mg total) by mouth daily. 90 tablet 1   HYDROcodone-acetaminophen (NORCO/VICODIN) 5-325 MG tablet Take 1-2 tablets by mouth 2 (two) times daily as needed for moderate pain or severe pain. 90 tablet 0   lansoprazole (PREVACID) 30 MG capsule Take 1 capsule (30 mg total) by mouth 2 (two) times daily before a meal. 60 capsule 3   losartan (COZAAR) 100 MG tablet Take 1 tablet by mouth once daily 90 tablet 0   metFORMIN (GLUCOPHAGE) 850 MG tablet TAKE 1 TABLET BY MOUTH TWICE DAILY WITH A MEAL 180 tablet 0   Pancrelipase, Lip-Prot-Amyl, (ZENPEP) 40000-126000 units  CPEP Take 2 capsules (80,000 Units total) by mouth 3 (three) times daily with meals. 1 capsule with snacks. 240 capsule 3   PARoxetine (PAXIL) 10 MG tablet Take 1 tablet (10 mg total) by mouth daily. 90 tablet 1   PARoxetine (PAXIL) 40 MG tablet TAKE 1 TABLET BY MOUTH ONCE DAILY IN THE MORNING 90 tablet 0   Probiotic Product (PROBIOTIC PO) Take 1 capsule by mouth daily.     prochlorperazine (COMPAZINE) 10 MG tablet Take 1 tablet (10 mg total) by mouth every 6 (six) hours as needed for nausea or vomiting. 30 tablet 2   rizatriptan (MAXALT-MLT) 10 MG disintegrating tablet Take  1 tablet (10 mg total) by mouth as needed for migraine. May repeat in 2 hours if needed. Max 2 per 24 hours 10 tablet 1   scopolamine (TRANSDERM-SCOP) 1 MG/3DAYS Place 1 patch (1.5 mg total) onto the skin every 3 (three) days. 10 patch 12   Semaglutide,0.25 or 0.5MG /DOS, (OZEMPIC, 0.25 OR 0.5 MG/DOSE,) 2 MG/1.5ML SOPN Inject 0.5 mg into the skin once a week. 3 mL 3   sucralfate (CARAFATE) 1 g tablet Take 1 tablet (1 g total) by mouth 4 (four) times daily -  with meals and at bedtime. 120 tablet 1   traZODone (DESYREL) 150 MG tablet Take 1 tablet (150 mg total) by mouth at bedtime as needed for sleep. 90 tablet 3   valACYclovir (VALTREX) 500 MG tablet Take 500 mg by mouth 2 (two) times daily as needed (outbreaks).     folic acid (FOLVITE) 1 MG tablet TAKE 1 TABLET BY MOUTH IN THE MORNING AND AT BEDTIME 90 tablet 1   methotrexate (RHEUMATREX) 2.5 MG tablet TAKE 8 TABLETS BY MOUTH ONCE A WEEK 32 tablet 0   EPINEPHrine 0.3 mg/0.3 mL IJ SOAJ injection Inject 0.3 mg into the muscle as needed for anaphylaxis. (Patient not taking: Reported on 02/10/2023) 1 each 0   No facility-administered medications prior to visit.    Review of Systems  Constitutional:  Positive for malaise/fatigue. Negative for chills, diaphoresis, fever and weight loss.  HENT:  Negative for congestion.   Respiratory:  Negative for cough, hemoptysis, sputum  production, shortness of breath and wheezing.   Cardiovascular:  Negative for chest pain, palpitations and leg swelling.  Gastrointestinal:  Positive for abdominal pain.  Musculoskeletal:  Positive for joint pain.     Objective:   There were no vitals filed for this visit.    Physical Exam: General: Well-appearing, no acute distress HENT: [place], AT Eyes: EOMI, no scleral icterus Respiratory: Clear to auscultation bilaterally.  No crackles, wheezing or rales Cardiovascular: RRR, -M/R/G, no JVD Extremities:-Edema,-tenderness Neuro: AAO x4, CNII-XII grossly intact Psych: Normal mood, normal affect  Data Reviewed:  Imaging: PET 07/09/21 - Hypermetabolic bilateral hilar and mediastinal lymph nodules, right ilac lesion, right sacral ala, right scapular spine, left scapular spine and left frontal bone Bone scan 07/08/22 - Abnormal tracer superior RIGHT scapula, LEFT frontal bone, and RIGHT SI joint  PFT: 09/30/21 FVC 2.56 (73%) FEV1 2.20 (81%) Ratio 86  TLC 71% DLCO 112% Interpretation: Mild restrictive defect with normal DLCO.  Cardiac: Cardiac MRI 03/24/22 - No evidence for cardiac involvement by sarcoidosis.  Labs:    Latest Ref Rng & Units 01/21/2023   12:53 PM 08/18/2022   11:31 AM 07/15/2022    3:11 PM  CMP  Glucose 70 - 99 mg/dL  045  409   BUN 6 - 20 mg/dL  11  11   Creatinine 8.11 - 1.00 mg/dL 9.14  7.82  9.56   Sodium 135 - 145 mmol/L  140  141   Potassium 3.5 - 5.1 mmol/L  3.5  4.0   Chloride 98 - 111 mmol/L  106  101   CO2 22 - 32 mmol/L  23  31   Calcium 8.9 - 10.3 mg/dL  9.2  9.9   Total Protein 6.5 - 8.1 g/dL  6.5    Total Bilirubin 0.3 - 1.2 mg/dL  0.6    Alkaline Phos 38 - 126 U/L  70    AST 15 - 41 U/L  30    ALT 0 - 44  U/L  33    Normal electrolytes and kidney function  CBC    Component Value Date/Time   WBC 6.0 08/18/2022 1131   RBC 4.26 08/18/2022 1131   HGB 13.1 08/18/2022 1131   HGB 13.4 11/18/2021 1026   HCT 38.0 08/18/2022 1131   HCT 38.9  11/18/2021 1026   PLT 313 08/18/2022 1131   PLT 322 11/18/2021 1026   MCV 89.2 08/18/2022 1131   MCV 87 11/18/2021 1026   MCH 30.8 08/18/2022 1131   MCHC 34.5 08/18/2022 1131   RDW 14.3 08/18/2022 1131   RDW 12.5 11/18/2021 1026   LYMPHSABS 1.7 08/18/2022 1131   LYMPHSABS 1.8 11/18/2021 1026   MONOABS 0.3 08/18/2022 1131   EOSABS 0.2 08/18/2022 1131   EOSABS 0.0 11/18/2021 1026   BASOSABS 0.0 08/18/2022 1131   BASOSABS 0.0 11/18/2021 1026  Normal blood counts  08/14/21 ACE 33 ESR  20    Assessment & Plan:   Discussion: 57 year old female with sarcoid with pulmonary and bone involvement, glaucoma, hx of melanoma s/p skin resections, DM2 who presents for follow-up for sarcoid. Currently on methotrexate and Humira with [facility]. Continuing care with me for continuity.  Sarcoidosis with lung and bone involvement - Refractory symptoms --Dx in 06/2021 via right iliac bone biopsy --Annual PFTs.  Last PFTs - 09/2021. Scheduled for PFTs with [facility] in 02/2023 --Recommend annual ophthalmology exam. Last seen 11/10/21 --EKG reviewed. No evidence of conduction abnormalities.  --Cardiac MRI 03/24/22 - No evidence for cardiac involvement by sarcoidosis.  Pulmonary sarcoid with restrictive defect Shortness of breath --CONTINUE Albuterol TWO puffs as needed for shortness of breath --May benefit from addition of Humira or change to another immunosuppressant. Discuss at next visit  Chronic immunosuppression/High risk medication use --08/14/21 Treated with prednisone 20 mg ~8 weeks --10/01/21 - Prednisone 40 mg ~6 weeks --12/03/21 Methotrexate weekly initiated with folic acid 2 mg daily --02/14/22 Weaned off steroids --04/19/22 - current. On methotrexate 20 mg weekly --10/2022 - current. On Humira every other week --Will refill methotrexate 20 mg weekly and folic acid. Advised further immunosuppressant care per Rheum --Reviewed labs including CBC and CMET. Very mild hypokalemia, recommend  multivitamin and potassium rich foods. Blood counts, eletrolytes and liver enzymes are stable  Osteoporosis prevention in setting of chronic steroid use --CONTINUE vitamin D 600-800 units daily through diet or supplements  Pancreatic head lesion Biliary dyskinesia Abnormal HIDA --Followed by Surgery and GI  Health Maintenance Immunization History  Administered Date(s) Administered   Influenza,inj,Quad PF,6+ Mos 09/09/2016, 08/29/2017, 09/27/2019, 10/30/2020, 08/14/2021   Influenza-Unspecified 08/22/2013, 07/22/2014, 08/08/2015   MMR 01/22/2011   PFIZER(Purple Top)SARS-COV-2 Vaccination 12/30/2019, 01/20/2020   Tdap 01/22/2011, 04/17/2014   CT Lung Screen - not indicated. Insufficient tobacco history  No orders of the defined types were placed in this encounter.  Meds ordered this encounter  Medications   folic acid (FOLVITE) 1 MG tablet    Sig: TAKE 1 TABLET BY MOUTH IN THE MORNING AND AT BEDTIME    Dispense:  60 tablet    Refill:  5    Please send all further refill requests electronically only.   methotrexate (RHEUMATREX) 2.5 MG tablet    Sig: Take 8 tablets (20 mg total) by mouth once a week.    Dispense:  64 tablet    Refill:  0   Return in about 6 months (around 08/12/2023).  I have spent a total time of 45-minutes on the day of the appointment including chart review, data  review, collecting history, coordinating care and discussing medical diagnosis and plan with the patient/family. Past medical history, allergies, medications were reviewed. Pertinent imaging, labs and tests included in this note have been reviewed and interpreted independently by me.  Erion Hermans Mechele Collin, MD [facility]

## 2023-02-11 ENCOUNTER — Telehealth (HOSPITAL_BASED_OUTPATIENT_CLINIC_OR_DEPARTMENT_OTHER): Payer: Self-pay | Admitting: Pulmonary Disease

## 2023-02-11 ENCOUNTER — Encounter (HOSPITAL_BASED_OUTPATIENT_CLINIC_OR_DEPARTMENT_OTHER): Payer: Self-pay | Admitting: Pulmonary Disease

## 2023-02-11 DIAGNOSIS — D869 Sarcoidosis, unspecified: Secondary | ICD-10-CM

## 2023-02-11 DIAGNOSIS — D849 Immunodeficiency, unspecified: Secondary | ICD-10-CM

## 2023-02-11 DIAGNOSIS — Z79899 Other long term (current) drug therapy: Secondary | ICD-10-CM

## 2023-02-11 NOTE — Telephone Encounter (Signed)
[facility] calling stating Dr. Everardo All needs to use a different manufacturer for Methotrexate and do generic instead. Please advise.

## 2023-02-14 NOTE — Telephone Encounter (Signed)
JE, please advise.  

## 2023-02-15 MED ORDER — METHOTREXATE SODIUM 2.5 MG PO TABS
20.0000 mg | ORAL_TABLET | ORAL | 0 refills | Status: AC
Start: 2023-02-15 — End: ?

## 2023-02-15 NOTE — Telephone Encounter (Signed)
Please contact pharmacy that orders have been placed with note stating Generic ok

## 2023-02-16 NOTE — Telephone Encounter (Signed)
Called and spoke with Morrie Sheldon at [facility]. She confirmed that they got it and filled it for the patient.   Nothing further needed.

## 2023-02-18 ENCOUNTER — Other Ambulatory Visit: Payer: Self-pay | Admitting: Family Medicine

## 2023-02-24 DIAGNOSIS — Z794 Long term (current) use of insulin: Secondary | ICD-10-CM | POA: Diagnosis not present

## 2023-02-24 DIAGNOSIS — E119 Type 2 diabetes mellitus without complications: Secondary | ICD-10-CM | POA: Diagnosis not present

## 2023-02-25 LAB — COMPREHENSIVE METABOLIC PANEL
ALT: 27 IU/L (ref 0–32)
AST: 28 IU/L (ref 0–40)
Albumin/Globulin Ratio: 1.7 (ref 1.2–2.2)
Albumin: 4.3 g/dL (ref 3.8–4.9)
Alkaline Phosphatase: 87 IU/L (ref 44–121)
BUN/Creatinine Ratio: 15 (ref 9–23)
BUN: 11 mg/dL (ref 6–24)
Bilirubin Total: 0.7 mg/dL (ref 0.0–1.2)
CO2: 20 mmol/L (ref 20–29)
Calcium: 9.5 mg/dL (ref 8.7–10.2)
Chloride: 104 mmol/L (ref 96–106)
Creatinine, Ser: 0.75 mg/dL (ref 0.57–1.00)
Globulin, Total: 2.5 g/dL (ref 1.5–4.5)
Glucose: 89 mg/dL (ref 70–99)
Potassium: 4.2 mmol/L (ref 3.5–5.2)
Sodium: 142 mmol/L (ref 134–144)
Total Protein: 6.8 g/dL (ref 6.0–8.5)
eGFR: 95 mL/min/{1.73_m2} (ref >59)

## 2023-02-25 LAB — LIPID PANEL
Chol/HDL Ratio: 5.5 ratio — ABNORMAL HIGH (ref 0.0–4.4)
Cholesterol, Total: 243 mg/dL — ABNORMAL HIGH (ref 100–199)
HDL: 44 mg/dL (ref >39)
LDL Chol Calc (NIH): 173 mg/dL — ABNORMAL HIGH (ref 0–99)
Triglycerides: 142 mg/dL (ref 0–149)
VLDL Cholesterol Cal: 26 mg/dL (ref 5–40)

## 2023-02-25 LAB — T4, FREE: Free T4: 1.36 ng/dL (ref 0.82–1.77)

## 2023-02-25 LAB — VITAMIN D 25 HYDROXY (VIT D DEFICIENCY, FRACTURES): Vit D, 25-Hydroxy: 80.2 ng/mL (ref 30.0–100.0)

## 2023-02-25 LAB — TSH: TSH: 0.796 u[IU]/mL (ref 0.450–4.500)

## 2023-03-01 ENCOUNTER — Encounter: Payer: Self-pay | Admitting: Nurse Practitioner

## 2023-03-01 ENCOUNTER — Ambulatory Visit: Payer: Medicaid Other | Admitting: Nurse Practitioner

## 2023-03-01 VITALS — BP 112/82 | HR 78 | Ht 63.5 in | Wt 154.6 lb

## 2023-03-01 DIAGNOSIS — E782 Mixed hyperlipidemia: Secondary | ICD-10-CM | POA: Diagnosis not present

## 2023-03-01 DIAGNOSIS — E119 Type 2 diabetes mellitus without complications: Secondary | ICD-10-CM | POA: Diagnosis not present

## 2023-03-01 DIAGNOSIS — Z794 Long term (current) use of insulin: Secondary | ICD-10-CM | POA: Diagnosis not present

## 2023-03-01 DIAGNOSIS — I10 Essential (primary) hypertension: Secondary | ICD-10-CM

## 2023-03-01 LAB — POCT GLYCOSYLATED HEMOGLOBIN (HGB A1C): Hemoglobin A1C: 4.9 % (ref 4.0–5.6)

## 2023-03-01 NOTE — Patient Instructions (Signed)

## 2023-03-01 NOTE — Progress Notes (Signed)
Endocrinology Follow Up Note       03/01/2023, 1:58 PM   Subjective:    Patient ID: TEEGHAN Hale, female    DOB: 1966-07-31.  Courtney Hale is being seen in follow up after being seen in consultation for management of currently uncontrolled symptomatic diabetes requested by  Tommie Sams, DO.   Past Medical History:  Diagnosis Date   Cancer [facility])    Skin   Complication of anesthesia    woke up during endoscopy and colonoscopy   Depression    Diabetes (HCC)    GERD (gastroesophageal reflux disease)    Hemorrhoids    History of esophageal stricture    2011  peptic w/  dilatation   History of gastritis    2011   History of melanoma excision    2015  left leg/   12-09-2015 back    Hypertension    Microhematuria    Migraines    Nephrolithiasis    left side non-obstructive    Right ureteral stone    Sarcoidosis 07/24/2021   Urgency of urination    Wears contact lenses     Past Surgical History:  Procedure Laterality Date   ABDOMINAL HYSTERECTOMY     BALLOON DILATION N/A 05/03/2022   Procedure: BALLOON DILATION;  Surgeon: Lanelle Bal, DO;  [facility];  Service: Endoscopy;  Laterality: N/A;   BIOPSY  11/16/2021   Procedure: BIOPSY;  Surgeon: Lanelle Bal, DO;  [facility];  Service: Endoscopy;;   BIOPSY  05/03/2022   Procedure: BIOPSY;  Surgeon: Lanelle Bal, DO;  [facility];  Service: Endoscopy;;   CARPAL TUNNEL RELEASE Right 10/29/2013   COLONOSCOPY N/A 08/27/2015   Procedure: COLONOSCOPY;  Surgeon: West Bali, MD;  [facility]: [facility];  Service: Endoscopy;  Laterality: N/A;  0830   COLONOSCOPY WITH PROPOFOL N/A 11/16/2021   Surgeon: Lanelle Bal, DO;  Nonbleeding internal hemorrhoids, otherwise normal exam.  Recommended 10-year screening colonoscopy.   CYST EXCISION  10/25/2010   right hand   CYSTOSCOPY W/ URETERAL STENT  PLACEMENT Right 12/22/2015   Procedure: CYSTOSCOPY WITH RETROGRADE PYELOGRAM/URETERAL STENT PLACEMENT;  Surgeon: Hildred Laser, MD;  Location: [facility];  Service: Urology;  Laterality: Right;   CYSTOSCOPY WITH RETROGRADE PYELOGRAM, URETEROSCOPY AND STENT PLACEMENT Right 01/05/2016   Procedure: CYSTOSCOPY WITH RETROGRADE PYELOGRAM, URETEROSCOPY AND STENT REPLACEMENT;  Surgeon: Hildred Laser, MD;  Location: [facility];  Service: Urology;  Laterality: Right;   DILATION AND CURETTAGE OF UTERUS  03/12/2009   w/  Suction   double balloon enteroscopy     Dr. Gwinda Passe at [facility]: no erosions, no evidence of Crohn's disease, path without Crohn's.    EGD with push enteroscopy  02/12/2010    patent distal peptic stricture with diffuse antral erythema, normal D1 and D2    ESOPHAGOGASTRODUODENOSCOPY (EGD) WITH PROPOFOL N/A 11/16/2021   Surgeon: Lanelle Bal, DO;   Gastritis biopsied, otherwise normal exam.  Pathology with mild chronic gastritis, negative for H. pylori.   ESOPHAGOGASTRODUODENOSCOPY (EGD) WITH PROPOFOL N/A 05/03/2022   Surgeon: Lanelle Bal, DO;  Normal esophagus s/p empiric dilation,  gastritis biopsied.  Pathology with mild chronic, focally active gastritis, negative for H. pylori.   HAND SURGERY     LAPAROSCOPIC ASSISTED VAGINAL HYSTERECTOMY  10/26/2014   w/  Bilateral Salpingoophorectomy   STONE EXTRACTION WITH BASKET Right 01/05/2016   Procedure: STONE EXTRACTION WITH BASKET;  Surgeon: Hildred Laser, MD;  Location: [facility];  Service: Urology;  Laterality: Right;   TONSILLECTOMY  1998  approx    Social History   Socioeconomic History   Marital status: Single    Spouse name: Not on file   Number of children: Not on file   Years of education: Not on file   Highest education level: Not on file  Occupational History   Not on file  Tobacco Use   Smoking status: Never    Passive exposure: Past   Smokeless tobacco: Never   Vaping Use   Vaping Use: Never used  Substance and Sexual Activity   Alcohol use: Not Currently   Drug use: No   Sexual activity: Not on file  Other Topics Concern   Not on file  Social History Narrative   Not on file   Social Determinants of Health   Financial Resource Strain: Low Risk  (06/30/2021)   Overall Financial Resource Strain (CARDIA)    Difficulty of Paying Living Expenses: Not hard at all  Food Insecurity: No Food Insecurity (06/30/2021)   Hunger Vital Sign    Worried About Running Out of Food in the Last Year: Never true    Ran Out of Food in the Last Year: Never true  Transportation Needs: No Transportation Needs (06/30/2021)   PRAPARE - Administrator, Civil Service (Medical): No    Lack of Transportation (Non-Medical): No  Physical Activity: Inactive (06/30/2021)   Exercise Vital Sign    Days of Exercise per Week: 0 days    Minutes of Exercise per Session: 0 min  Stress: Stress Concern Present (06/30/2021)   [employer] of Occupational Health - Occupational Stress Questionnaire    Feeling of Stress : Very much  Social Connections: Socially Isolated (06/30/2021)   Social Connection and Isolation Panel [NHANES]    Frequency of Communication with Friends and Family: More than three times a week    Frequency of Social Gatherings with Friends and Family: More than three times a week    Attends Religious Services: Never    Database administrator or Organizations: No    Attends Engineer, structural: Never    Marital Status: Never married    Family History  Problem Relation Age of Onset   Hypertension Mother    Thyroid disease Mother    Diabetes Mother    Hyperlipidemia Mother    Stroke Mother    Heart disease Mother    Hyperlipidemia Father    Hypertension Father    Heart attack Father    Heart failure Father    Colon cancer Neg Hx     Outpatient Encounter Medications as of 03/01/2023  Medication Sig   albuterol (VENTOLIN HFA) 108 (90  Base) MCG/ACT inhaler Inhale 2 puffs into the lungs every 6 (six) hours as needed for wheezing or shortness of breath.   BD PEN NEEDLE NANO 2ND GEN 32G X 4 MM MISC USE AS DIRECTED   busPIRone (BUSPAR) 5 MG tablet TAKE 1 TABLET BY MOUTH THREE TIMES DAILY   calcium carbonate (TUMS - DOSED IN MG ELEMENTAL CALCIUM) 500 MG chewable tablet Chew 1,500 mg by mouth  2 (two) times daily as needed for indigestion or heartburn.   Carboxymethylcellul-Glycerin (LUBRICATING EYE DROPS OP) Place 1 drop into both eyes daily as needed (dry eyes).   Chromium 1000 MCG TABS Take 1,000 mcg by mouth daily.   CINNAMON PO Take 2,000 mg by mouth daily.   Continuous Blood Gluc Receiver (FREESTYLE LIBRE 2 READER) DEVI Use as directed to check blood sugars.   Continuous Blood Gluc Sensor (FREESTYLE LIBRE 2 SENSOR) MISC Use sensor as directed. Change every 10-14 days.   Cyanocobalamin (B-12) 2000 MCG TABS Take 2,000 mcg by mouth daily.   dicyclomine (BENTYL) 10 MG capsule Take 1 capsule (10 mg total) by mouth up to 4 (four) times daily (before meals and at bedtime) for diarrhea and abdominal cramping.   folic acid (FOLVITE) 1 MG tablet TAKE 1 TABLET BY MOUTH IN THE MORNING AND AT BEDTIME   hydrochlorothiazide (HYDRODIURIL) 25 MG tablet Take 1 tablet (25 mg total) by mouth daily.   HYDROcodone-acetaminophen (NORCO/VICODIN) 5-325 MG tablet Take 1-2 tablets by mouth 2 (two) times daily as needed for moderate pain or severe pain.   lansoprazole (PREVACID) 30 MG capsule Take 1 capsule (30 mg total) by mouth 2 (two) times daily before a meal.   losartan (COZAAR) 100 MG tablet Take 1 tablet by mouth once daily   methotrexate (RHEUMATREX) 2.5 MG tablet Take 8 tablets (20 mg total) by mouth once a week.   Pancrelipase, Lip-Prot-Amyl, (ZENPEP) 40000-126000 units CPEP Take 2 capsules (80,000 Units total) by mouth 3 (three) times daily with meals. 1 capsule with snacks.   PARoxetine (PAXIL) 10 MG tablet Take 1 tablet (10 mg total) by mouth  daily.   PARoxetine (PAXIL) 40 MG tablet TAKE 1 TABLET BY MOUTH ONCE DAILY IN THE MORNING   Probiotic Product (PROBIOTIC PO) Take 1 capsule by mouth daily.   prochlorperazine (COMPAZINE) 10 MG tablet Take 1 tablet (10 mg total) by mouth every 6 (six) hours as needed for nausea or vomiting.   rizatriptan (MAXALT-MLT) 10 MG disintegrating tablet Take 1 tablet (10 mg total) by mouth as needed for migraine. May repeat in 2 hours if needed. Max 2 per 24 hours   scopolamine (TRANSDERM-SCOP) 1 MG/3DAYS Place 1 patch (1.5 mg total) onto the skin every 3 (three) days.   Semaglutide,0.25 or 0.5MG /DOS, (OZEMPIC, 0.25 OR 0.5 MG/DOSE,) 2 MG/1.5ML SOPN Inject 0.5 mg into the skin once a week.   sucralfate (CARAFATE) 1 g tablet Take 1 tablet (1 g total) by mouth 4 (four) times daily -  with meals and at bedtime.   traZODone (DESYREL) 150 MG tablet Take 1 tablet (150 mg total) by mouth at bedtime as needed for sleep.   valACYclovir (VALTREX) 500 MG tablet Take 500 mg by mouth 2 (two) times daily as needed (outbreaks).   [DISCONTINUED] metFORMIN (GLUCOPHAGE) 850 MG tablet TAKE 1 TABLET BY MOUTH TWICE DAILY WITH A MEAL   EPINEPHrine 0.3 mg/0.3 mL IJ SOAJ injection Inject 0.3 mg into the muscle as needed for anaphylaxis. (Patient not taking: Reported on 02/10/2023)   No facility-administered encounter medications on file as of 03/01/2023.    ALLERGIES: Allergies  Allergen Reactions   Topamax [Topiramate] Other (See Comments)    Developed kidney stones   Fluorouracil Swelling    Burning skin   Sulfa Antibiotics Other (See Comments)    Burning sensation, flushing to skin    VACCINATION STATUS: Immunization History  Administered Date(s) Administered   Influenza,inj,Quad PF,6+ Mos 09/09/2016, 08/29/2017, 09/27/2019, 10/30/2020, 08/14/2021  Influenza-Unspecified 08/22/2013, 07/22/2014, 08/08/2015   MMR 01/22/2011   PFIZER(Purple Top)SARS-COV-2 Vaccination 12/30/2019, 01/20/2020   Tdap 01/22/2011, 04/17/2014     Diabetes She presents for her follow-up diabetic visit. She has type 2 diabetes mellitus. Onset time: diagnosed at approx age of 83. Her disease course has been improving. There are no hypoglycemic associated symptoms. Associated symptoms include weight loss. There are no hypoglycemic complications. There are no diabetic complications. Risk factors for coronary artery disease include diabetes mellitus, dyslipidemia, family history, hypertension and obesity. Current diabetic treatments: Ozempic only. She is compliant with treatment some of the time (has only needed 1-2 injections of insulin each week). Her weight is decreasing steadily. She is following a generally healthy diet. When asked about meal planning, she reported none. She has not had a previous visit with a dietitian. She participates in exercise intermittently. Her home blood glucose trend is decreasing steadily. Her overall blood glucose range is 70-90 mg/dl. (She presents today with her CGM showing at goal glycemic profile overall.  Her POCT A1c today is 4.9%, improving from last visit of 5.8%.  She notes she has not taken the Metformin since last visit here, was having some lower glucose readings so she stopped it.  Analysis of her CGM shows TIR 100%, TAR 0%, TBR 0% with a GMI of 5.6%.) An ACE inhibitor/angiotensin II receptor blocker is being taken. She does not see a podiatrist.Eye exam is current.  Hyperlipidemia This is a chronic problem. The current episode started more than 1 year ago. The problem is uncontrolled. Recent lipid tests were reviewed and are high. Exacerbating diseases include diabetes and obesity. Factors aggravating her hyperlipidemia include thiazides and fatty foods. She is currently on no antihyperlipidemic treatment. Compliance problems include adherence to diet and adherence to exercise.  Risk factors for coronary artery disease include diabetes mellitus, dyslipidemia, family history, obesity and hypertension.   Hypertension This is a chronic problem. The current episode started more than 1 year ago. The problem has been resolved since onset. The problem is controlled. Agents associated with hypertension include steroids. Risk factors for coronary artery disease include diabetes mellitus, dyslipidemia, family history and obesity. Past treatments include diuretics and angiotensin blockers. The current treatment provides significant improvement. There are no compliance problems.      Review of systems  Constitutional: + Minimally fluctuating body weight, current Body mass index is 26.96 kg/m., no fatigue, no subjective hyperthermia, no subjective hypothermia Eyes: no blurry vision, no xerophthalmia ENT: no sore throat, no nodules palpated in throat, no dysphagia/odynophagia, no hoarseness Cardiovascular: no chest pain, no shortness of breath, no palpitations, no leg swelling Respiratory: no cough, no shortness of breath Gastrointestinal: no nausea/vomiting/diarrhea Musculoskeletal: generalized aches and pains- says r/t sarcoidosis Skin: no rashes, no hyperemia Neurological: no tremors, no numbness, no tingling, no dizziness Psychiatric: no depression, no anxiety  Objective:     BP 112/82 (BP Location: Right Arm, Patient Position: Sitting, Cuff Size: Large)   Pulse 78   Ht 5' 3.5" (1.613 m)   Wt 154 lb 9.6 oz (70.1 kg)   LMP 10/26/2014   BMI 26.96 kg/m   Wt Readings from Last 3 Encounters:  03/01/23 154 lb 9.6 oz (70.1 kg)  01/14/23 161 lb (73 kg)  12/15/22 166 lb 9.6 oz (75.6 kg)     BP Readings from Last 3 Encounters:  03/01/23 112/82  01/14/23 109/77  12/15/22 105/73      Physical Exam- Limited  Constitutional:  Body mass index is 26.96 kg/m. ,  not in acute distress, normal state of mind Eyes:  EOMI, no exophthalmos Musculoskeletal: no gross deformities, strength intact in all four extremities, no gross restriction of joint movements Skin:  no rashes, no  hyperemia Neurological: no tremor with outstretched hands  Diabetic Foot Exam - Simple   No data filed     CMP ( most recent) CMP     Component Value Date/Time   NA 142 02/24/2023 1334   K 4.2 02/24/2023 1334   CL 104 02/24/2023 1334   CO2 20 02/24/2023 1334   GLUCOSE 89 02/24/2023 1334   GLUCOSE 143 (H) 08/18/2022 1131   BUN 11 02/24/2023 1334   CREATININE 0.75 02/24/2023 1334   CREATININE 0.66 07/15/2022 1511   CALCIUM 9.5 02/24/2023 1334   PROT 6.8 02/24/2023 1334   ALBUMIN 4.3 02/24/2023 1334   AST 28 02/24/2023 1334   ALT 27 02/24/2023 1334   ALKPHOS 87 02/24/2023 1334   BILITOT 0.7 02/24/2023 1334   GFRNONAA >60 08/18/2022 1131   GFRAA 121 08/27/2020 1517     Diabetic Labs (most recent): Lab Results  Component Value Date   HGBA1C 4.9 03/01/2023   HGBA1C 5.8 (A) 11/29/2022   HGBA1C 6.0 (A) 07/29/2022   MICROALBUR 10mg /L 07/29/2022     Lipid Panel ( most recent) Lipid Panel     Component Value Date/Time   CHOL 243 (H) 02/24/2023 1334   TRIG 142 02/24/2023 1334   HDL 44 02/24/2023 1334   CHOLHDL 5.5 (H) 02/24/2023 1334   LDLCALC 173 (H) 02/24/2023 1334   LABVLDL 26 02/24/2023 1334      Lab Results  Component Value Date   TSH 0.796 02/24/2023   TSH 0.93 07/15/2022   TSH 0.996 08/27/2020   TSH 1.220 06/09/2016   TSH 0.785 08/13/2015   FREET4 1.36 02/24/2023           Assessment & Plan:   1) Controlled type 2 diabetes mellitus without complication  She presents today with her CGM showing at goal glycemic profile overall.  Her POCT A1c today is 4.9%, improving from last visit of 5.8%.  She notes she has not taken the Metformin since last visit here, was having some lower glucose readings so she stopped it.  Analysis of her CGM shows TIR 100%, TAR 0%, TBR 0% with a GMI of 5.6%.  - MORIYA CIMMINO has currently uncontrolled symptomatic type 2 DM since 57 years of age.   -Recent labs reviewed.  - I had a long discussion with her about the  progressive nature of diabetes and the pathology behind its complications. -her diabetes is complicated by chronic steroid use due to sarcoidosis and she remains at a high risk for more acute and chronic complications which include CAD, CVA, CKD, retinopathy, and neuropathy. These are all discussed in detail with her.  The following Lifestyle Medicine recommendations according to American College of Lifestyle Medicine [facility]) were discussed and offered to patient and she agrees to start the journey:  A. Whole Foods, Plant-based plate comprising of fruits and vegetables, plant-based proteins, whole-grain carbohydrates was discussed in detail with the patient.   A list for source of those nutrients were also provided to the patient.  Patient will use only water or unsweetened tea for hydration. B.  The need to stay away from risky substances including alcohol, smoking; obtaining 7 to 9 hours of restorative sleep, at least 150 minutes of moderate intensity exercise weekly, the importance of healthy social connections,  and stress reduction  techniques were discussed. C.  A full color page of  Calorie density of various food groups per pound showing examples of each food groups was provided to the patient.  - Nutritional counseling repeated at each appointment due to patients tendency to fall back in to old habits.  - The patient admits there is a room for improvement in their diet and drink choices. -  Suggestion is made for the patient to avoid simple carbohydrates from their diet including Cakes, Sweet Desserts / Pastries, Ice Cream, Soda (diet and regular), Sweet Tea, Candies, Chips, Cookies, Sweet Pastries, Store Bought Juices, Alcohol in Excess of 1-2 drinks a day, Artificial Sweeteners, Coffee Creamer, and "Sugar-free" Products. This will help patient to have stable blood glucose profile and potentially avoid unintended weight gain.   - I encouraged the patient to switch to unprocessed or minimally  processed complex starch and increased protein intake (animal or plant source), fruits, and vegetables.   - Patient is advised to stick to a routine mealtimes to eat 3 meals a day and avoid unnecessary snacks (to snack only to correct hypoglycemia).  - she sees Norm Salt, RDN, CDE for diabetes education.  - I have approached her with the following individualized plan to manage her diabetes and patient agrees:   -She is advised to continue Ozempic 0.5 mg SQ weekly for now (may look at discontinuing at next visit if still at goal).  She can stay off Metformin altogether and insulin as well.   -she is encouraged to continue monitoring glucose at least once daily (using her CGM), before breakfast, to log their readings on the clinic sheets provided, and to call the clinic if she has readings less than 70 or above 300 for 3 tests in a row.  - Adjustment parameters are given to her for hypo and hyperglycemia in writing.  - She is allergic to sulfa meds.  - Specific targets for  A1c; LDL, HDL, and Triglycerides were discussed with the patient.  2) Blood Pressure /Hypertension:  her blood pressure is controlled to target.   she is advised to continue her current medications including Losartan 100 mg p.o. daily with breakfast and HCTZ 25 mg po daily.  3) Lipids/Hyperlipidemia:    Review of her recent lipid panel from 02/24/23 showed uncontrolled LDL at 173-slightly worse.  She is not currently on any lipid lowering medications. She is advised to avoid fried foods and butter.  4)  Weight/Diet:  her Body mass index is 26.96 kg/m.  -  clearly complicating her diabetes care.   she is a candidate for weight loss. I discussed with her the fact that loss of 5 - 10% of her  current body weight will have the most impact on her diabetes management.  Exercise, and detailed carbohydrates information provided  -  detailed on discharge instructions.  5) Vitamin D deficiency Currently on OTC vitamin D3  supplement.  Recent vitamin D level from 02/24/23 was 80.2.  She can stop her vitamin d supplement for now.  6) Chronic Care/Health Maintenance: -she is on ACEI/ARB and not on Statin medications and is encouraged to initiate and continue to follow up with Ophthalmology, Dentist, Podiatrist at least yearly or according to recommendations, and advised to stay away from smoking. I have recommended yearly flu vaccine and pneumonia vaccine at least every 5 years; moderate intensity exercise for up to 150 minutes weekly; and sleep for at least 7 hours a day.  - she is advised to maintain close  follow up with Tommie Sams, DO for primary care needs, as well as her other providers for optimal and coordinated care.      I spent  31  minutes in the care of the patient today including review of labs from CMP, Lipids, Thyroid Function, Hematology (current and previous including abstractions from other facilities); face-to-face time discussing  her blood glucose readings/logs, discussing hypoglycemia and hyperglycemia episodes and symptoms, medications doses, her options of short and long term treatment based on the latest standards of care / guidelines;  discussion about incorporating lifestyle medicine;  and documenting the encounter. Risk reduction counseling performed per USPSTF guidelines to reduce obesity and cardiovascular risk factors.     Please refer to Patient Instructions for Blood Glucose Monitoring and Insulin/Medications Dosing Guide"  in media tab for additional information. Please  also refer to " Patient Self Inventory" in the Media  tab for reviewed elements of pertinent patient history.  Veneda Melter participated in the discussions, expressed understanding, and voiced agreement with the above plans.  All questions were answered to her satisfaction. she is encouraged to contact clinic should she have any questions or concerns prior to her return visit.     Follow up plan: - Return in  about 6 months (around 09/01/2023) for Diabetes F/U with A1c in office, No previsit labs, Bring meter and logs.  Ronny Bacon, [facility] [address] [place], [place] [address] Phone: [phone] Fax: [phone]  03/01/2023, 1:58 PM

## 2023-03-09 DIAGNOSIS — D869 Sarcoidosis, unspecified: Secondary | ICD-10-CM | POA: Diagnosis not present

## 2023-03-09 DIAGNOSIS — R918 Other nonspecific abnormal finding of lung field: Secondary | ICD-10-CM | POA: Diagnosis not present

## 2023-03-09 DIAGNOSIS — Z87891 Personal history of nicotine dependence: Secondary | ICD-10-CM | POA: Diagnosis not present

## 2023-03-09 DIAGNOSIS — D84821 Immunodeficiency due to drugs: Secondary | ICD-10-CM | POA: Diagnosis not present

## 2023-03-16 ENCOUNTER — Ambulatory Visit: Payer: Medicaid Other | Admitting: Internal Medicine

## 2023-03-16 ENCOUNTER — Ambulatory Visit: Payer: Medicaid Other | Admitting: Gastroenterology

## 2023-03-16 ENCOUNTER — Encounter: Payer: Self-pay | Admitting: Gastroenterology

## 2023-03-16 VITALS — BP 93/64 | HR 109 | Temp 98.0°F | Ht 63.5 in | Wt 153.2 lb

## 2023-03-16 DIAGNOSIS — K869 Disease of pancreas, unspecified: Secondary | ICD-10-CM | POA: Diagnosis not present

## 2023-03-16 DIAGNOSIS — K5900 Constipation, unspecified: Secondary | ICD-10-CM | POA: Diagnosis not present

## 2023-03-16 DIAGNOSIS — R112 Nausea with vomiting, unspecified: Secondary | ICD-10-CM

## 2023-03-16 DIAGNOSIS — K760 Fatty (change of) liver, not elsewhere classified: Secondary | ICD-10-CM

## 2023-03-16 DIAGNOSIS — K828 Other specified diseases of gallbladder: Secondary | ICD-10-CM

## 2023-03-16 DIAGNOSIS — K8681 Exocrine pancreatic insufficiency: Secondary | ICD-10-CM | POA: Diagnosis not present

## 2023-03-16 DIAGNOSIS — K219 Gastro-esophageal reflux disease without esophagitis: Secondary | ICD-10-CM | POA: Diagnosis not present

## 2023-03-16 MED ORDER — POLYETHYLENE GLYCOL 3350 17 GM/SCOOP PO POWD: ORAL | 3 refills | Status: AC

## 2023-03-16 NOTE — Patient Instructions (Signed)
Dicyclomine is on your medication list.  Please verify whether or not you are taking it.  This can contribute to constipation.  Please stop for now. Continue Zenpep 2 capsules with meals and 1 with snacks.  Make sure you take during the meal, not before or after. Stop fiber capsules/pill.  Start fiber supplement that is either a powder or chewable.  Benefiber makes both options.  Fiber Choice is also an option that is a chewable fiber tablet.  You need 3 to 4 g of fiber per day in a supplement. Take MiraLAX 2 capfuls daily and at least 12 to 16 ounces of water.  Once her bowel movements are regular, you can back down to 1 capful daily.  If this is not effective, please call and we will start a prescription option. I will further review your chart and discuss other potential options for management of your nausea/vomiting, abdominal pain with Dr. Marletta Lor.  Further recommendations to follow.

## 2023-03-16 NOTE — Progress Notes (Signed)
GI Office Note    Referring Provider: Tommie Sams, DO Primary Care Physician:  Tommie Sams, DO  Primary Gastroenterologist: Hennie Duos. Marletta Lor, DO   Chief Complaint   Chief Complaint  Patient presents with   Follow-up    Having the same issues but now she is vomiting after she eats    History of Present Illness   KIAN CALISTRO is a 57 y.o. female presenting today for follow-up.  Last seen in February 2024.  She has a history of sarcoidosis along with bone metastasis currently on methotrexate, GERD, dysphagia, epigastric/right upper quadrant pain, nausea, weight loss, early satiety, IBS, biliary dyskinesia, pancreatic lesion.  Diagnosed with sarcoidosis in 2022. Takes methotrexate on Mondays. In the beginning was on prednisone and developed hyperglycemia. Metformin started in 2022. Does not take metformin if sugars low. Takes Humira every 14 days.    Started with intermittent vomiting several months ago. Now taking nausea medication daily. Initially on compazine. Tried adding scopolamine (recently when she went on a cruise) but patches would not stay on with her long hair. Now also on zofran. Started on Ozempic in 11/2022. Currently on 0.5mg , decided to stay with same dose of Ozempic as opposed to dose escalation.   She takes methotrexate on Mondays and Ozempic on Tuesdays. Symptoms of nausea and diarrhea get worse every day but most severe by Thursday of the week. Overall does not feel like her symptoms are worse on Ozempic. Lost 38 pounds prior to medication. Since on medication, she has dropped another 12-15 pounds.   States she has been to general surgery at [facility]. Follows up with them in June. They started her on Carafate back in 09/2022 and wanted her to follow up after her repeat pancreatic imaging had been done.   She continues on Zenpep 2 with meals and 1 snacks. Continues to have RUQ/epigastric pain. Worse with palpation. Her dog likes to  lay on her stomach and this causes pain. Has abdominal pain with and without meals. Stools are [facility] 2. Diarrhea resolved. No melena, brbpr.   CT abdomen pancreas with and without contrast March 2024: IMPRESSION: -Focal fatty infiltration in the pancreatic head, reflecting a pseudo lesion rather than a true mass, corresponding to the prior CT abnormality. No follow-up is recommended. -2.3 cm benign hemangioma in the posterior right hepatic dome. -Suspected mild hepatic steatosis.   Wt Readings from Last 25 Encounters:  03/16/23 153 lb 3.2 oz (69.5 kg)  03/01/23 154 lb 9.6 oz (70.1 kg)  01/14/23 161 lb (73 kg)  12/15/22 166 lb 9.6 oz (75.6 kg)  11/29/22 166 lb 6.4 oz (75.5 kg)  10/14/22 171 lb 9.6 oz (77.8 kg)  08/25/22 170 lb 9.6 oz (77.4 kg)  08/18/22 175 lb 6.4 oz (79.6 kg)  08/11/22 176 lb (79.8 kg)  07/29/22 176 lb 12.8 oz (80.2 kg)  07/20/22 176 lb 9.6 oz (80.1 kg)  07/15/22 174 lb 9.6 oz (79.2 kg)  07/15/22 176 lb 9.6 oz (80.1 kg)  06/04/22 179 lb 8 oz (81.4 kg)  05/31/22 183 lb 9.6 oz (83.3 kg)  05/06/22 185 lb (83.9 kg)  04/19/22 191 lb 9.6 oz (86.9 kg)  04/14/22 188 lb (85.3 kg)  04/05/22 188 lb (85.3 kg)  03/24/22 193 lb (87.5 kg)  03/23/22 195 lb (88.5 kg)  02/25/22 194 lb (88 kg)  02/25/22 194 lb (88 kg)  02/24/22 191 lb (86.6 kg)  02/10/22 198 lb 3.2 oz (89.9  kg)   Previous workup:   Celiac testing WNL.   MRI/MRCP 08/13/2022 hepatic steatosis, subcentimeter lesion in the pancreatic neck with recommended repeat CT in 6 months, small benign hepatic hemangioma.   HIDA scan 07/27/2022 with biliary dyskinesia.   CT abdomen pelvis with contrast 07/21/2022 with lesion in the pancreatic neck, stable hemangioma of the liver.   RUQ ultrasound 6/26 with hepatic steatosis, gallbladder and CBD normal.   Gastric emptying study 6/27 with normal exam.   EGD 05/03/2022: Normal esophagus s/p empiric dilation, gastritis biopsied.  Pathology with mild chronic, focally  active gastritis, negative for H. pylori.   Colonoscopy 11/05/21: Nonbleeding internal hemorrhoids, otherwise normal exam.  Recommended 10-year screening colonoscopy.     EGD 11/05/21: Gastritis biopsied, otherwise normal exam. Pathology with mild chronic gastritis, negative for H. pylori.    Fecal elastase low 07/2022 at 104.  Medications   Current Outpatient Medications  Medication Sig Dispense Refill   albuterol (VENTOLIN HFA) 108 (90 Base) MCG/ACT inhaler Inhale 2 puffs into the lungs every 6 (six) hours as needed for wheezing or shortness of breath. 8 g 6   BD PEN NEEDLE NANO 2ND GEN 32G X 4 MM MISC USE AS DIRECTED 100 each 0   busPIRone (BUSPAR) 5 MG tablet TAKE 1 TABLET BY MOUTH THREE TIMES DAILY 90 tablet 1   calcium carbonate (TUMS - DOSED IN MG ELEMENTAL CALCIUM) 500 MG chewable tablet Chew 1,500 mg by mouth 2 (two) times daily as needed for indigestion or heartburn.     Chromium 1000 MCG TABS Take 1,000 mcg by mouth daily.     CINNAMON PO Take 2,000 mg by mouth daily.     Continuous Blood Gluc Receiver (FREESTYLE LIBRE 2 READER) DEVI Use as directed to check blood sugars. 1 each 0   Continuous Blood Gluc Sensor (FREESTYLE LIBRE 2 SENSOR) MISC Use sensor as directed. Change every 10-14 days. 2 each 3   Cyanocobalamin (B-12) 2000 MCG TABS Take 2,000 mcg by mouth daily.     cyclobenzaprine (FLEXERIL) 10 MG tablet Take by mouth.     EPINEPHrine 0.3 mg/0.3 mL IJ SOAJ injection Inject 0.3 mg into the muscle as needed for anaphylaxis. 1 each 0   folic acid (FOLVITE) 1 MG tablet TAKE 1 TABLET BY MOUTH IN THE MORNING AND AT BEDTIME 60 tablet 5   HUMIRA, 2 PEN, 40 MG/0.4ML PNKT INJECT 40MG  SUBCUTANEOUSLY EVERY 14 DAYS.     hydrochlorothiazide (HYDRODIURIL) 25 MG tablet Take 1 tablet (25 mg total) by mouth daily. 90 tablet 1   HYDROcodone-acetaminophen (NORCO/VICODIN) 5-325 MG tablet Take 1-2 tablets by mouth 2 (two) times daily as needed for moderate pain or severe pain. 90 tablet 0    lansoprazole (PREVACID) 30 MG capsule Take 1 capsule (30 mg total) by mouth 2 (two) times daily before a meal. 60 capsule 3   losartan (COZAAR) 100 MG tablet Take 1 tablet by mouth once daily 90 tablet 1   metFORMIN (GLUCOPHAGE) 850 MG tablet Take 1 tablet by mouth 2 (two) times daily with a meal.     methotrexate (RHEUMATREX) 2.5 MG tablet Take 8 tablets (20 mg total) by mouth once a week. 64 tablet 0   ondansetron (ZOFRAN) 4 MG tablet Take 4 mg by mouth every 8 (eight) hours as needed.     Pancrelipase, Lip-Prot-Amyl, (ZENPEP) 40000-126000 units CPEP Take 2 capsules (80,000 Units total) by mouth 3 (three) times daily with meals. 1 capsule with snacks. 240 capsule 3   PARoxetine (  PAXIL) 10 MG tablet Take 1 tablet (10 mg total) by mouth daily. 90 tablet 1   PARoxetine (PAXIL) 40 MG tablet TAKE 1 TABLET BY MOUTH ONCE DAILY IN THE MORNING 90 tablet 0   polyethylene glycol powder (GLYCOLAX/MIRALAX) 17 GM/SCOOP powder Take two capfuls daily in 12-16 ounces of water. Once bowel movements are soft, you can drop down to one capful daily. 255 g 3   Probiotic Product (PROBIOTIC PO) Take 1 capsule by mouth daily.     psyllium (REGULOID) 0.52 g capsule Take 1 capsule by mouth daily.     rizatriptan (MAXALT-MLT) 10 MG disintegrating tablet Take 1 tablet (10 mg total) by mouth as needed for migraine. May repeat in 2 hours if needed. Max 2 per 24 hours 10 tablet 1   sucralfate (CARAFATE) 1 g tablet Take 1 tablet (1 g total) by mouth 4 (four) times daily -  with meals and at bedtime. 120 tablet 1   traZODone (DESYREL) 150 MG tablet Take 1 tablet (150 mg total) by mouth at bedtime as needed for sleep. 90 tablet 3   valACYclovir (VALTREX) 500 MG tablet Take 500 mg by mouth 2 (two) times daily as needed (outbreaks).     OZEMPIC, 0.25 OR 0.5 MG/DOSE, 2 MG/3ML SOPN INJECT 0.5 MG INTO THE SKIN ONCE A WEEK 3 mL 0   No current facility-administered medications for this visit.    Allergies   Allergies as of 03/16/2023  - Review Complete 03/16/2023  Allergen Reaction Noted   Sulfa antibiotics Other (See Comments) and Hives 12/05/2012   Topamax [topiramate] Other (See Comments) 12/18/2016   Fluorouracil Swelling 11/05/2021      Review of Systems   General: Negative for anorexia,  fever, chills, fatigue, weakness. ENT: Negative for hoarseness, difficulty swallowing , nasal congestion. CV: Negative for chest pain, angina, palpitations, dyspnea on exertion, peripheral edema.  Respiratory: Negative for dyspnea at rest, dyspnea on exertion, cough, sputum, wheezing.  GI: See history of present illness. GU:  Negative for dysuria, hematuria, urinary incontinence, urinary frequency, nocturnal urination.  Endo: Negative for unusual weight change.     Physical Exam   BP 93/64 (BP Location: Right Arm, Patient Position: Sitting, Cuff Size: Normal)   Pulse (!) 109   Temp 98 F (36.7 C) (Oral)   Ht 5' 3.5" (1.613 m)   Wt 153 lb 3.2 oz (69.5 kg)   LMP 10/26/2014   SpO2 97%   BMI 26.71 kg/m    General: Well-nourished, well-developed in no acute distress.  Eyes: No icterus. Mouth: Oropharyngeal mucosa moist and pink   Abdomen: Bowel sounds are normal,  nondistended, no hepatosplenomegaly or masses,  no abdominal bruits or hernia , no rebound or guarding. Mild epig tenderness/ruq tendereness Rectal: not performed  Extremities: No lower extremity edema. No clubbing or deformities. Neuro: Alert and oriented x 4   Skin: Warm and dry, no jaundice.   Psych: Alert and cooperative, normal mood and affect.  Labs   Lab Results  Component Value Date   HGBA1C 4.9 03/01/2023   Lab Results  Component Value Date   CREATININE 0.75 02/24/2023   BUN 11 02/24/2023   NA 142 02/24/2023   K 4.2 02/24/2023   CL 104 02/24/2023   CO2 20 02/24/2023   Lab Results  Component Value Date   ALT 27 02/24/2023   AST 28 02/24/2023   ALKPHOS 87 02/24/2023   BILITOT 0.7 02/24/2023    Lab Results  Component Value Date  TSH 0.796 02/24/2023   Labs from March 2024: Blood cell count 7200, hemoglobin 13.1, hematocrit 37.1, platelets 259,000.   Imaging Studies   No results found.  Assessment   *Chronic nausea *RUQ pain/Epigastric pain *Pancreatic neck lesion: *EPI *Biliary dyskinesia *Constipation  Symptoms likely multifactorial. Chronic nausea has become more frequent over past few months. Following with general surgery for possibility of symptoms associated with biliary dyskinesia. Has follow up next month. Symptoms likely exacerbated by medications both MTX and now Ozempic.    Her diarrhea has resolved on Zenpep. In fact she is mostly constipated now. Using zofran for nausea likely adding to sx. Needs to make sure not also taking Bentyl.   Pancreatic neck lesion following. Last CT suspicious for focal fatty infiltration of the pancreas, reflecting pseudolesion rather than mass.   PLAN   Verify if taking bentyl. If so, stop as can contribute to constipation. Continue Zenpep 2 capsules with meals and 1 with snacks.  Stop fiber capsules/pills. Start fiber supplement such as powder or chewable. Try to get 3-4 grams per day.  Take miralax 2 capfuls daily with 12-16 ounces of water. Once BMs regular, can back down to one capful daily. If not effective, she will call for prescription option.  To discuss further with Dr. Marletta Lor.  Encouraged her to follow up with surgery.  Continue Lansoprazole twice daily. Continue zofran for nausea.   Leanna Battles. Melvyn Neth, MHS, PA-C [facility]

## 2023-03-23 ENCOUNTER — Other Ambulatory Visit: Payer: Self-pay | Admitting: Nurse Practitioner

## 2023-03-23 DIAGNOSIS — E119 Type 2 diabetes mellitus without complications: Secondary | ICD-10-CM

## 2023-03-26 ENCOUNTER — Encounter: Payer: Self-pay | Admitting: Gastroenterology

## 2023-03-26 ENCOUNTER — Telehealth: Payer: Self-pay | Admitting: Gastroenterology

## 2023-03-26 DIAGNOSIS — R112 Nausea with vomiting, unspecified: Secondary | ICD-10-CM | POA: Insufficient documentation

## 2023-03-26 DIAGNOSIS — K5900 Constipation, unspecified: Secondary | ICD-10-CM | POA: Insufficient documentation

## 2023-03-26 DIAGNOSIS — K8681 Exocrine pancreatic insufficiency: Secondary | ICD-10-CM | POA: Insufficient documentation

## 2023-03-26 NOTE — Telephone Encounter (Signed)
Opened in error

## 2023-03-29 ENCOUNTER — Other Ambulatory Visit: Payer: Self-pay | Admitting: Family Medicine

## 2023-03-29 DIAGNOSIS — I10 Essential (primary) hypertension: Secondary | ICD-10-CM

## 2023-03-30 ENCOUNTER — Other Ambulatory Visit: Payer: Self-pay

## 2023-03-30 DIAGNOSIS — D869 Sarcoidosis, unspecified: Secondary | ICD-10-CM

## 2023-03-30 DIAGNOSIS — C7951 Secondary malignant neoplasm of bone: Secondary | ICD-10-CM

## 2023-03-31 ENCOUNTER — Inpatient Hospital Stay: Payer: Medicaid Other

## 2023-03-31 ENCOUNTER — Inpatient Hospital Stay: Payer: Medicaid Other | Attending: Hematology | Admitting: Oncology

## 2023-04-15 ENCOUNTER — Ambulatory Visit: Payer: Medicaid Other | Admitting: Family Medicine

## 2023-04-15 VITALS — BP 151/98 | HR 71 | Temp 98.6°F | Wt 158.4 lb

## 2023-04-15 DIAGNOSIS — I10 Essential (primary) hypertension: Secondary | ICD-10-CM

## 2023-04-15 DIAGNOSIS — F419 Anxiety disorder, unspecified: Secondary | ICD-10-CM | POA: Diagnosis not present

## 2023-04-15 DIAGNOSIS — Z7985 Long-term (current) use of injectable non-insulin antidiabetic drugs: Secondary | ICD-10-CM

## 2023-04-15 DIAGNOSIS — E119 Type 2 diabetes mellitus without complications: Secondary | ICD-10-CM

## 2023-04-15 DIAGNOSIS — F32A Depression, unspecified: Secondary | ICD-10-CM | POA: Diagnosis not present

## 2023-04-15 NOTE — Patient Instructions (Addendum)
Stop Ozempic.   Continue your other medications.  Referral placed.  Follow up in 6 months.

## 2023-04-18 NOTE — Progress Notes (Signed)
Subjective:  Patient ID: Courtney Hale, female    DOB: 1966/01/28  Age: 57 y.o. MRN: [id number]  CC: Follow-up   HPI:  57 year old female with migraine, hypertension, IBS, GERD, biliary dyskinesia, type 2 diabetes, sarcoidosis with bone involvement, anxiety and depression, chronic pain presents for follow-up.  Patient reports that she is having severe depression and anxiety.  PHQ-9 score of 17 and GAD-7 score of 18 today.  She is on Paxil 50 mg daily.  She is also on BuSpar.  Uses trazodone for insomnia.  Patient reports that this is mostly impacted by her ongoing medical issues.  She would like to see a Veterinary surgeon.  BP elevated here today.  Patient is on hydrochlorothiazide and losartan.  She has not yet taken her medication today.  This is the reason her blood pressure is elevated.  Her blood pressure is normally well-controlled.  Diabetes is now exceptionally well-controlled.  Most recent A1c 4.9.  She is currently on Ozempic.  Recommend discontinuation.  Continues to be followed closely by rheumatology and pulmonology regarding sarcoidosis.  At this point in time, she does not have any significant lung involvement.  She continues to have bone involvement.  On methotrexate and Humira.  She has pain particularly in the occipital region as well as in the pelvis.  These are locations where she has osseous involvement of sarcoid.  Patient Active Problem List   Diagnosis Date Noted   Exocrine pancreatic insufficiency 03/26/2023   Occipital headache 01/16/2023   Pancreatic lesion 10/14/2022   Hepatic steatosis 10/14/2022   Biliary dyskinesia 10/14/2022   DM (diabetes mellitus), type 2 (HCC) 07/15/2022   Insomnia 07/15/2022   Encounter for pain management 03/03/2022   High risk medication use 01/06/2022   Immunosuppression (HCC) 11/16/2021   Sarcoidosis 08/14/2021   Gastroesophageal reflux disease without esophagitis 07/27/2017   Anxiety and depression 12/18/2016   IBS (irritable  bowel syndrome) 03/18/2016   Melanoma of skin (HCC) 03/18/2016   Headache, chronic migraine without aura 01/31/2013   Hypertension 01/31/2013    Social Hx   Social History   Socioeconomic History   Marital status: Single    Spouse name: Not on file   Number of children: Not on file   Years of education: Not on file   Highest education level: Not on file  Occupational History   Not on file  Tobacco Use   Smoking status: Never    Passive exposure: Past   Smokeless tobacco: Never  Vaping Use   Vaping Use: Never used  Substance and Sexual Activity   Alcohol use: Not Currently   Drug use: No   Sexual activity: Yes  Other Topics Concern   Not on file  Social History Narrative   Not on file   Social Determinants of Health   Financial Resource Strain: Low Risk  (06/30/2021)   Overall Financial Resource Strain (CARDIA)    Difficulty of Paying Living Expenses: Not hard at all  Food Insecurity: No Food Insecurity (06/30/2021)   Hunger Vital Sign    Worried About Running Out of Food in the Last Year: Never true    Ran Out of Food in the Last Year: Never true  Transportation Needs: No Transportation Needs (06/30/2021)   PRAPARE - Administrator, Civil Service (Medical): No    Lack of Transportation (Non-Medical): No  Physical Activity: Inactive (06/30/2021)   Exercise Vital Sign    Days of Exercise per Week: 0 days    Minutes of  Exercise per Session: 0 min  Stress: Stress Concern Present (06/30/2021)   [employer] of Occupational Health - Occupational Stress Questionnaire    Feeling of Stress : Very much  Social Connections: Socially Isolated (06/30/2021)   Social Connection and Isolation Panel [NHANES]    Frequency of Communication with Friends and Family: More than three times a week    Frequency of Social Gatherings with Friends and Family: More than three times a week    Attends Religious Services: Never    Database administrator or Organizations: No    Attends  Engineer, structural: Never    Marital Status: Never married    Review of Systems Per HPI  Objective:  BP (!) 151/98   Pulse 71   Temp 98.6 F (37 C)   Wt 158 lb 6.4 oz (71.8 kg)   LMP 10/26/2014   SpO2 96%   BMI 27.62 kg/m      04/15/2023    1:19 PM 03/16/2023   11:09 AM 03/01/2023    1:31 PM  BP/Weight  Systolic BP 151 93 112  Diastolic BP 98 64 82  Wt. (Lbs) 158.4 153.2 154.6  BMI 27.62 kg/m2 26.71 kg/m2 26.96 kg/m2    Physical Exam Vitals and nursing note reviewed.  Constitutional:      General: She is not in acute distress.    Appearance: Normal appearance.  HENT:     Head: Normocephalic and atraumatic.  Eyes:     General:        Right eye: No discharge.        Left eye: No discharge.     Conjunctiva/sclera: Conjunctivae normal.  Cardiovascular:     Rate and Rhythm: Normal rate and regular rhythm.  Pulmonary:     Effort: Pulmonary effort is normal.     Breath sounds: Normal breath sounds. No wheezing, rhonchi or rales.  Neurological:     Mental Status: She is alert.  Psychiatric:        Behavior: Behavior normal.     Comments: Anxious.     Lab Results  Component Value Date   WBC 6.0 08/18/2022   HGB 13.1 08/18/2022   HCT 38.0 08/18/2022   PLT 313 08/18/2022   GLUCOSE 89 02/24/2023   CHOL 243 (H) 02/24/2023   TRIG 142 02/24/2023   HDL 44 02/24/2023   LDLCALC 173 (H) 02/24/2023   ALT 27 02/24/2023   AST 28 02/24/2023   NA 142 02/24/2023   K 4.2 02/24/2023   CL 104 02/24/2023   CREATININE 0.75 02/24/2023   BUN 11 02/24/2023   CO2 20 02/24/2023   TSH 0.796 02/24/2023   INR 0.9 07/21/2021   HGBA1C 4.9 03/01/2023   MICROALBUR 10mg /L 07/29/2022     Assessment & Plan:   Problem List Items Addressed This Visit       Cardiovascular and Mediastinum   Hypertension    Has stable.  Advised to continue her current medications.  She has not taken it today.        Endocrine   DM (diabetes mellitus), type 2 (HCC)    A1c 4.9.   Recommend discontinuation of Ozempic.        Other   Anxiety and depression - Primary    Uncontrolled and worsening.  Referring to psychology.      Relevant Orders   Ambulatory referral to Psychology    Follow-up: 6 months  Aubrea Meixner Adriana Simas DO [facility]

## 2023-04-18 NOTE — Assessment & Plan Note (Signed)
Uncontrolled and worsening.  Referring to psychology.

## 2023-04-18 NOTE — Assessment & Plan Note (Addendum)
Has stable.  Advised to continue her current medications.  She has not taken it today.

## 2023-04-18 NOTE — Assessment & Plan Note (Signed)
A1c 4.9.  Recommend discontinuation of Ozempic.

## 2023-04-21 DIAGNOSIS — D485 Neoplasm of uncertain behavior of skin: Secondary | ICD-10-CM | POA: Diagnosis not present

## 2023-04-21 DIAGNOSIS — Z08 Encounter for follow-up examination after completed treatment for malignant neoplasm: Secondary | ICD-10-CM | POA: Diagnosis not present

## 2023-04-21 DIAGNOSIS — D1801 Hemangioma of skin and subcutaneous tissue: Secondary | ICD-10-CM | POA: Diagnosis not present

## 2023-04-21 DIAGNOSIS — L814 Other melanin hyperpigmentation: Secondary | ICD-10-CM | POA: Diagnosis not present

## 2023-04-21 DIAGNOSIS — C44311 Basal cell carcinoma of skin of nose: Secondary | ICD-10-CM | POA: Diagnosis not present

## 2023-04-21 DIAGNOSIS — C44329 Squamous cell carcinoma of skin of other parts of face: Secondary | ICD-10-CM | POA: Diagnosis not present

## 2023-04-21 DIAGNOSIS — Z85820 Personal history of malignant melanoma of skin: Secondary | ICD-10-CM | POA: Diagnosis not present

## 2023-04-21 DIAGNOSIS — L718 Other rosacea: Secondary | ICD-10-CM | POA: Diagnosis not present

## 2023-04-21 DIAGNOSIS — Z85828 Personal history of other malignant neoplasm of skin: Secondary | ICD-10-CM | POA: Diagnosis not present

## 2023-04-21 DIAGNOSIS — L821 Other seborrheic keratosis: Secondary | ICD-10-CM | POA: Diagnosis not present

## 2023-05-02 ENCOUNTER — Telehealth: Payer: Self-pay | Admitting: Family Medicine

## 2023-05-02 ENCOUNTER — Other Ambulatory Visit: Payer: Self-pay

## 2023-05-02 DIAGNOSIS — K828 Other specified diseases of gallbladder: Secondary | ICD-10-CM | POA: Diagnosis not present

## 2023-05-02 NOTE — Telephone Encounter (Signed)
Refill on hydrocodone send to [facility]

## 2023-05-03 ENCOUNTER — Other Ambulatory Visit: Payer: Self-pay | Admitting: Nurse Practitioner

## 2023-05-03 MED ORDER — HYDROCODONE-ACETAMINOPHEN 5-325 MG PO TABS: 1.0000 | ORAL_TABLET | Freq: Two times a day (BID) | ORAL | 0 refills | Status: DC | PRN

## 2023-05-03 NOTE — Telephone Encounter (Signed)
Done

## 2023-05-08 ENCOUNTER — Other Ambulatory Visit: Payer: Self-pay | Admitting: Nurse Practitioner

## 2023-05-08 DIAGNOSIS — E119 Type 2 diabetes mellitus without complications: Secondary | ICD-10-CM

## 2023-05-17 ENCOUNTER — Inpatient Hospital Stay: Payer: Medicaid Other | Attending: Hematology

## 2023-05-17 DIAGNOSIS — D869 Sarcoidosis, unspecified: Secondary | ICD-10-CM

## 2023-05-17 DIAGNOSIS — C7951 Secondary malignant neoplasm of bone: Secondary | ICD-10-CM

## 2023-05-17 LAB — COMPREHENSIVE METABOLIC PANEL
ALT: 25 U/L (ref 0–44)
AST: 26 U/L (ref 15–41)
Albumin: 4 g/dL (ref 3.5–5.0)
Alkaline Phosphatase: 72 U/L (ref 38–126)
Anion gap: 11 (ref 5–15)
BUN: 13 mg/dL (ref 6–20)
CO2: 22 mmol/L (ref 22–32)
Calcium: 9.1 mg/dL (ref 8.9–10.3)
Chloride: 103 mmol/L (ref 98–111)
Creatinine, Ser: 0.75 mg/dL (ref 0.44–1.00)
GFR, Estimated: >60 mL/min (ref >60)
Glucose, Bld: 100 mg/dL — ABNORMAL HIGH (ref 70–99)
Potassium: 3.4 mmol/L — ABNORMAL LOW (ref 3.5–5.1)
Sodium: 136 mmol/L (ref 135–145)
Total Bilirubin: 0.4 mg/dL (ref 0.3–1.2)
Total Protein: 7.3 g/dL (ref 6.5–8.1)

## 2023-05-17 LAB — CBC WITH DIFFERENTIAL/PLATELET
Abs Immature Granulocytes: 0.01 10*3/uL (ref 0.00–0.07)
Basophils Absolute: 0 10*3/uL (ref 0.0–0.1)
Basophils Relative: 1 %
Eosinophils Absolute: 0.1 10*3/uL (ref 0.0–0.5)
Eosinophils Relative: 1 %
HCT: 39.1 % (ref 36.0–46.0)
Hemoglobin: 13.7 g/dL (ref 12.0–15.0)
Immature Granulocytes: 0 %
Lymphocytes Relative: 43 %
Lymphs Abs: 2.8 10*3/uL (ref 0.7–4.0)
MCH: 30.8 pg (ref 26.0–34.0)
MCHC: 35 g/dL (ref 30.0–36.0)
MCV: 87.9 fL (ref 80.0–100.0)
Monocytes Absolute: 0.3 10*3/uL (ref 0.1–1.0)
Monocytes Relative: 5 %
Neutro Abs: 3.3 10*3/uL (ref 1.7–7.7)
Neutrophils Relative %: 50 %
Platelets: 250 10*3/uL (ref 150–400)
RBC: 4.45 MIL/uL (ref 3.87–5.11)
RDW: 13.3 % (ref 11.5–15.5)
WBC: 6.5 10*3/uL (ref 4.0–10.5)
nRBC: 0 % (ref 0.0–0.2)

## 2023-05-17 LAB — LACTATE DEHYDROGENASE: LDH: 165 U/L (ref 98–192)

## 2023-05-24 NOTE — Progress Notes (Signed)
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 Limit use of pain relievers to no more than 2 days out of week to prevent risk of rebound or medication-overuse headache. Keep headache diary Follow up 6 months.   Subjective:  Courtney Hale is a 57 year old female with HTN, sarcoidosis, DM, and history of melanoma and kidney stone who presents for headache.  History supplemented by referring provider's note.  History of classic migraines with headaches involving various locations in the head.  She has sarcoidosis with multiple lesions to the bone.  About 5 months ago, she began experiencing an occipital headache described as a throbbing pain across the back of the head and involving the back of the neck.  Scalp tender to touch.  It is a persistent dull ache but fluctuates in intensity.  When severe, it is associated with blurred vision, photophobia and phonophobia.  She has nausea but she is always nauseous due to medications.  Lasts a couple of hours and occurs 2 to 3 times a week.  Any pressure on the back of head either by palpation or while laying supine, aggravates it.  Not aggravated by neck movement. She notes blurred vision in the left eye and plans to see her eye doctor.  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    MRI of brain with and without contrast on 07/08/2021 personally  reviewed showed 2 cm lesion in left frontal bone but normal brain.    Past NSAIDS/analgesics:  naproxen, etodolac, meloxicam Past abortive triptans:  rizatriptan-MLT 10mg  (made head feel funny) Past abortive ergotamine:  none Past muscle relaxants:  none Past anti-emetic:  Compazine Past antihypertensive medications:  verapamil Past antidepressant medications:  citalopram Past anticonvulsant medications:  topiramate (kidney stones) Past anti-CGRP:  none Past vitamins/Herbal/Supplements:  none Past antihistamines/decongestants:  Benadryl, meclizine Other past therapies:  none  Current NSAIDS/analgesics:  Vicodin (chronic pain) Current triptans: none Current ergotamine:  none Current anti-emetic:  Zofran 4mg  Current muscle relaxants:  Flexeril 10mg  Current Antihypertensive medications:  losartan, HCTZ Current Antidepressant medications:  paroxetine 40mg  daily Current Anticonvulsant medications:  none Current anti-CGRP:  none Other therapy:  no Birth control:  no Other medications:  Humira, methotrexate, buspirone, trazodone   Caffeine:   Occasional iced coffee or diet soft drink Diet:  Not enough water.  Skips meals Exercise:  no - due to her medications (chemo and Humira), fatigued Depression:  yes; Anxiety:  yes.  She will be starting to see a therapist.   Other pain:  diffuse Sleep hygiene:  1-2 hours a night on trazodone.  Anxiety.  Brain doesn't shut off.   Family history of migraines:  no      PAST MEDICAL HISTORY: Past Medical History:  Diagnosis Date   Cancer (HCC)    Skin   Complication of anesthesia    woke up during endoscopy and colonoscopy   Depression    Diabetes (HCC)    GERD (gastroesophageal reflux disease)    Hemorrhoids  History of esophageal stricture    2011  peptic w/  dilatation   History of gastritis    2011   History of melanoma excision    2015  left leg/   12-09-2015 back    Hypertension    Microhematuria    Migraines     Nephrolithiasis    left side non-obstructive    Right ureteral stone    Sarcoidosis 07/24/2021   Urgency of urination    Wears contact lenses     PAST SURGICAL HISTORY: Past Surgical History:  Procedure Laterality Date   ABDOMINAL HYSTERECTOMY     BALLOON DILATION N/A 05/03/2022   Procedure: BALLOON DILATION;  Surgeon: Lanelle Bal, DO;  Location: [facility];  Service: Endoscopy;  Laterality: N/A;   BIOPSY  11/16/2021   Procedure: BIOPSY;  Surgeon: Lanelle Bal, DO;  Location: [facility];  Service: Endoscopy;;   BIOPSY  05/03/2022   Procedure: BIOPSY;  Surgeon: Lanelle Bal, DO;  Location: [facility];  Service: Endoscopy;;   CARPAL TUNNEL RELEASE Right 10/29/2013   COLONOSCOPY N/A 08/27/2015   Procedure: COLONOSCOPY;  Surgeon: West Bali, MD;  Location: [facility];  Service: Endoscopy;  Laterality: N/A;  0830   COLONOSCOPY WITH PROPOFOL N/A 11/16/2021   Surgeon: Lanelle Bal, DO;  Nonbleeding internal hemorrhoids, otherwise normal exam.  Recommended 10-year screening colonoscopy.   CYST EXCISION  10/25/2010   right hand   CYSTOSCOPY W/ URETERAL STENT PLACEMENT Right 12/22/2015   Procedure: CYSTOSCOPY WITH RETROGRADE PYELOGRAM/URETERAL STENT PLACEMENT;  Surgeon: Hildred Laser, MD;  Location: [facility];  Service: Urology;  Laterality: Right;   CYSTOSCOPY WITH RETROGRADE PYELOGRAM, URETEROSCOPY AND STENT PLACEMENT Right 01/05/2016   Procedure: CYSTOSCOPY WITH RETROGRADE PYELOGRAM, URETEROSCOPY AND STENT REPLACEMENT;  Surgeon: Hildred Laser, MD;  Location: [facility];  Service: Urology;  Laterality: Right;   DILATION AND CURETTAGE OF UTERUS  03/12/2009   w/  Suction   double balloon enteroscopy     Dr. Gwinda Passe at [facility]: no erosions, no evidence of Crohn's disease, path without Crohn's.    EGD with push enteroscopy  02/12/2010    patent distal peptic stricture with diffuse antral erythema, normal D1 and D2     ESOPHAGOGASTRODUODENOSCOPY (EGD) WITH PROPOFOL N/A 11/16/2021   Surgeon: Lanelle Bal, DO;   Gastritis biopsied, otherwise normal exam.  Pathology with mild chronic gastritis, negative for H. pylori.   ESOPHAGOGASTRODUODENOSCOPY (EGD) WITH PROPOFOL N/A 05/03/2022   Surgeon: Lanelle Bal, DO;  Normal esophagus s/p empiric dilation, gastritis biopsied.  Pathology with mild chronic, focally active gastritis, negative for H. pylori.   HAND SURGERY     LAPAROSCOPIC ASSISTED VAGINAL HYSTERECTOMY  10/26/2014   w/  Bilateral Salpingoophorectomy   STONE EXTRACTION WITH BASKET Right 01/05/2016   Procedure: STONE EXTRACTION WITH BASKET;  Surgeon: Hildred Laser, MD;  Location: [facility];  Service: Urology;  Laterality: Right;   TONSILLECTOMY  1998  approx    MEDICATIONS: Current Outpatient Medications on File Prior to Visit  Medication Sig Dispense Refill   albuterol (VENTOLIN HFA) 108 (90 Base) MCG/ACT inhaler Inhale 2 puffs into the lungs every 6 (six) hours as needed for wheezing or shortness of breath. 8 g 6   BD PEN NEEDLE NANO 2ND GEN 32G X 4 MM MISC USE AS DIRECTED 100 each 0   busPIRone (BUSPAR) 5 MG tablet TAKE 1 TABLET BY MOUTH THREE TIMES DAILY 90 tablet 1  calcium carbonate (TUMS - DOSED IN MG ELEMENTAL CALCIUM) 500 MG chewable tablet Chew 1,500 mg by mouth 2 (two) times daily as needed for indigestion or heartburn.     Chromium 1000 MCG TABS Take 1,000 mcg by mouth daily.     CINNAMON PO Take 2,000 mg by mouth daily.     Continuous Blood Gluc Sensor (FREESTYLE LIBRE 2 SENSOR) MISC Use sensor as directed. Change every 10-14 days. 2 each 3   Cyanocobalamin (B-12) 2000 MCG TABS Take 2,000 mcg by mouth daily.     cyclobenzaprine (FLEXERIL) 10 MG tablet Take by mouth.     EPINEPHrine 0.3 mg/0.3 mL IJ SOAJ injection Inject 0.3 mg into the muscle as needed for anaphylaxis. 1 each 0   folic acid (FOLVITE) 1 MG tablet TAKE 1 TABLET BY MOUTH IN THE MORNING AND AT  BEDTIME 60 tablet 5   HUMIRA, 2 PEN, 40 MG/0.4ML PNKT INJECT 40MG  SUBCUTANEOUSLY EVERY 14 DAYS.     hydrochlorothiazide (HYDRODIURIL) 25 MG tablet Take 1 tablet by mouth once daily 90 tablet 0   HYDROcodone-acetaminophen (NORCO/VICODIN) 5-325 MG tablet Take 1-2 tablets by mouth 2 (two) times daily as needed for moderate pain or severe pain. 90 tablet 0   lansoprazole (PREVACID) 30 MG capsule Take 1 capsule (30 mg total) by mouth 2 (two) times daily before a meal. 60 capsule 3   losartan (COZAAR) 100 MG tablet Take 1 tablet by mouth once daily 90 tablet 1   methotrexate (RHEUMATREX) 2.5 MG tablet Take 8 tablets (20 mg total) by mouth once a week. 64 tablet 0   ondansetron (ZOFRAN) 4 MG tablet Take 4 mg by mouth every 8 (eight) hours as needed.     OZEMPIC, 0.25 OR 0.5 MG/DOSE, 2 MG/3ML SOPN INJECT 0.5 MG INTO THE SKIN ONCE A WEEK 3 mL 0   Pancrelipase, Lip-Prot-Amyl, (ZENPEP) 40000-126000 units CPEP Take 2 capsules (80,000 Units total) by mouth 3 (three) times daily with meals. 1 capsule with snacks. 240 capsule 3   PARoxetine (PAXIL) 10 MG tablet Take 1 tablet by mouth once daily 90 tablet 0   PARoxetine (PAXIL) 40 MG tablet TAKE 1 TABLET BY MOUTH ONCE DAILY IN THE MORNING 90 tablet 0   polyethylene glycol powder (GLYCOLAX/MIRALAX) 17 GM/SCOOP powder Take two capfuls daily in 12-16 ounces of water. Once bowel movements are soft, you can drop down to one capful daily. 255 g 3   Probiotic Product (PROBIOTIC PO) Take 1 capsule by mouth daily.     psyllium (REGULOID) 0.52 g capsule Take 1 capsule by mouth daily.     rizatriptan (MAXALT-MLT) 10 MG disintegrating tablet Take 1 tablet (10 mg total) by mouth as needed for migraine. May repeat in 2 hours if needed. Max 2 per 24 hours 10 tablet 1   sucralfate (CARAFATE) 1 g tablet Take 1 tablet (1 g total) by mouth 4 (four) times daily -  with meals and at bedtime. 120 tablet 1   traZODone (DESYREL) 150 MG tablet Take 1 tablet (150 mg total) by mouth at  bedtime as needed for sleep. 90 tablet 3   valACYclovir (VALTREX) 500 MG tablet Take 500 mg by mouth 2 (two) times daily as needed (outbreaks).     No current facility-administered medications on file prior to visit.    ALLERGIES: Allergies  Allergen Reactions   Sulfa Antibiotics Other (See Comments) and Hives    Burning sensation, flushing to skin  Other Reaction(s): Other (See Comments)   Topamax [Topiramate]  Other (See Comments)    Developed kidney stones   Fluorouracil Swelling    Burning skin    FAMILY HISTORY: Family History  Problem Relation Age of Onset   Hypertension Mother    Thyroid disease Mother    Diabetes Mother    Hyperlipidemia Mother    Stroke Mother    Heart disease Mother    Hyperlipidemia Father    Hypertension Father    Heart attack Father    Heart failure Father    Colon cancer Neg Hx     Objective:  Blood pressure 116/80, pulse 80, height 5\' 3"  (1.6 m), weight 153 lb 6.4 oz (69.6 kg), last menstrual period 10/26/2014, SpO2 96%. General: No acute distress.  Patient appears well-groomed.   Head:  Normocephalic/atraumatic Eyes:  fundi examined but not visualized Neck: supple, bilateral paraspinal tenderness, full range of motion Back: No paraspinal tenderness Heart: regular rate and rhythm Neurological Exam: Mental status: alert and oriented to person, place, and time, speech fluent and not dysarthric, language intact. Cranial nerves: CN I: not tested CN II: pupils equal, round and reactive to light, visual fields intact CN III, IV, VI:  full range of motion, no nystagmus, no ptosis CN V: facial sensation intact. CN VII: upper and lower face symmetric CN VIII: hearing intact CN IX, X: gag intact, uvula midline CN XI: sternocleidomastoid and trapezius muscles intact CN XII: tongue midline Bulk & Tone: normal, no fasciculations. Motor:  muscle strength 5/5 throughout Sensation: temperature and vibratory sensation intact. Deep Tendon  Reflexes:  2+ throughout,  toes downgoing.   Finger to nose testing:  Without dysmetria.   Gait:  Normal station and stride.  Romberg negative.    Thank you for allowing me to take part in the care of this patient.  Shon Millet, DO  CC: Everlene Other, DO

## 2023-05-25 ENCOUNTER — Encounter: Payer: Self-pay | Admitting: Neurology

## 2023-05-25 ENCOUNTER — Ambulatory Visit: Payer: Medicaid Other | Admitting: Neurology

## 2023-05-25 VITALS — BP 116/80 | HR 80 | Ht 63.0 in | Wt 153.4 lb

## 2023-05-25 DIAGNOSIS — F410 Panic disorder [episodic paroxysmal anxiety] without agoraphobia: Secondary | ICD-10-CM | POA: Diagnosis not present

## 2023-05-25 DIAGNOSIS — R519 Headache, unspecified: Secondary | ICD-10-CM

## 2023-05-25 DIAGNOSIS — Z85820 Personal history of malignant melanoma of skin: Secondary | ICD-10-CM

## 2023-05-25 DIAGNOSIS — D869 Sarcoidosis, unspecified: Secondary | ICD-10-CM | POA: Diagnosis not present

## 2023-05-25 DIAGNOSIS — G43709 Chronic migraine without aura, not intractable, without status migrainosus: Secondary | ICD-10-CM

## 2023-05-25 DIAGNOSIS — F411 Generalized anxiety disorder: Secondary | ICD-10-CM | POA: Diagnosis not present

## 2023-05-25 DIAGNOSIS — F331 Major depressive disorder, recurrent, moderate: Secondary | ICD-10-CM | POA: Diagnosis not present

## 2023-05-25 MED ORDER — SUMATRIPTAN SUCCINATE 100 MG PO TABS: ORAL_TABLET | ORAL | 5 refills | Status: DC

## 2023-05-25 MED ORDER — EMGALITY 120 MG/ML ~~LOC~~ SOAJ: 240.0000 mg | Freq: Once | SUBCUTANEOUS | 0 refills | Status: AC

## 2023-05-25 NOTE — Patient Instructions (Signed)
Check MRI of brain with and without contrast Start Emgality - 2 injections for first dose, then 1 injection every 28 days thereafter.  When you pick up first dose, make sure you get 2 pens.  Contact me to let me know you picked it up and I will send prescription in for standing order At earliest onset of severe headache, take sumatriptan.  May repeat after 2 hours.  Maximum 2 tablets in 24 hours.   Limit use of pain relievers to no more than 2 days out of week to prevent risk of rebound or medication-overuse headache. Keep headache diary

## 2023-05-26 ENCOUNTER — Telehealth: Payer: Self-pay

## 2023-05-26 ENCOUNTER — Inpatient Hospital Stay: Payer: Medicaid Other | Attending: Hematology | Admitting: Oncology

## 2023-05-26 VITALS — BP 110/87 | HR 79 | Temp 99.4°F | Resp 18 | Ht 63.0 in | Wt 151.0 lb

## 2023-05-26 DIAGNOSIS — Z8041 Family history of malignant neoplasm of ovary: Secondary | ICD-10-CM | POA: Insufficient documentation

## 2023-05-26 DIAGNOSIS — M25552 Pain in left hip: Secondary | ICD-10-CM | POA: Diagnosis not present

## 2023-05-26 DIAGNOSIS — G43709 Chronic migraine without aura, not intractable, without status migrainosus: Secondary | ICD-10-CM

## 2023-05-26 DIAGNOSIS — Z806 Family history of leukemia: Secondary | ICD-10-CM | POA: Insufficient documentation

## 2023-05-26 DIAGNOSIS — D869 Sarcoidosis, unspecified: Secondary | ICD-10-CM | POA: Diagnosis not present

## 2023-05-26 DIAGNOSIS — Z85820 Personal history of malignant melanoma of skin: Secondary | ICD-10-CM | POA: Insufficient documentation

## 2023-05-26 DIAGNOSIS — M25551 Pain in right hip: Secondary | ICD-10-CM | POA: Insufficient documentation

## 2023-05-26 DIAGNOSIS — R591 Generalized enlarged lymph nodes: Secondary | ICD-10-CM | POA: Diagnosis not present

## 2023-05-26 DIAGNOSIS — Z808 Family history of malignant neoplasm of other organs or systems: Secondary | ICD-10-CM | POA: Insufficient documentation

## 2023-05-26 DIAGNOSIS — R519 Headache, unspecified: Secondary | ICD-10-CM | POA: Insufficient documentation

## 2023-05-26 DIAGNOSIS — Z800 Family history of malignant neoplasm of digestive organs: Secondary | ICD-10-CM | POA: Insufficient documentation

## 2023-05-26 NOTE — Telephone Encounter (Signed)
PA needed Emgality 120 mg

## 2023-05-26 NOTE — Progress Notes (Signed)
[place] [address], [place] [address]   CLINIC:  Medical Oncology/Hematology  PCP:  Tommie Sams, DO [address] [place] [address] [phone]   REASON FOR VISIT:  Follow-up for multiple bone lesions/lymphadenopathy/sarcoidosis  PRIOR THERAPY: none  NGS Results: not done  CURRENT THERAPY: Methotrexate  BRIEF ONCOLOGIC HISTORY:  Oncology History   No history exists.    CANCER STAGING:  Cancer Staging  No matching staging information was found for the patient.  INTERVAL HISTORY:  [name], a 57 y.o. female, seen for follow-up of bone lesions and lymphadenopathy.  She was diagnosed with sarcoidosis and is followed by sarcoidosis clinic at [facility].  She is currently on methotrexate and Humira which have not controlled her symptoms.  She continues to have bilateral low back and hip pain.  She does take hydrocodone as needed for this.  She is having diarrhea secondary to methotrexate.  Feels like her energy levels are stable but low.  Was evaluated by neurology yesterday for occipital headaches.  She was put on a new monthly medication Emgality and will be having an MRI of her brain in the next few weeks.  REVIEW OF SYSTEMS:  Review of Systems  Constitutional:  Positive for fatigue. Negative for appetite change, fever and unexpected weight change.  HENT:   Negative for nosebleeds, sore throat and trouble swallowing.   Eyes: Negative.   Respiratory:  Positive for cough and shortness of breath. Negative for wheezing.   Cardiovascular:  Positive for chest pain. Negative for leg swelling.  Gastrointestinal:  Positive for diarrhea and vomiting. Negative for abdominal pain, blood in stool, constipation and nausea.  Endocrine: Negative.   Genitourinary: Negative.  Negative for bladder incontinence, hematuria and nocturia.   Musculoskeletal: Negative.  Negative for back pain and flank pain.  Skin: Negative.   Neurological:  Positive  for dizziness and headaches. Negative for light-headedness and numbness.  Hematological: Negative.   Psychiatric/Behavioral:  Positive for depression. Negative for confusion. The patient is nervous/anxious.     PAST MEDICAL/SURGICAL HISTORY:  Past Medical History:  Diagnosis Date   Cancer [facility])    Skin   Complication of anesthesia    woke up during endoscopy and colonoscopy   Depression    Diabetes (HCC)    GERD (gastroesophageal reflux disease)    Hemorrhoids    History of esophageal stricture    2011  peptic w/  dilatation   History of gastritis    2011   History of melanoma excision    2015  left leg/   12-09-2015 back    Hypertension    Microhematuria    Migraines    Nephrolithiasis    left side non-obstructive    Right ureteral stone    Sarcoidosis 07/24/2021   Urgency of urination    Wears contact lenses    Past Surgical History:  Procedure Laterality Date   ABDOMINAL HYSTERECTOMY     BALLOON DILATION N/A 05/03/2022   Procedure: BALLOON DILATION;  Surgeon: Lanelle Bal, DO;  [facility];  Service: Endoscopy;  Laterality: N/A;   BIOPSY  11/16/2021   Procedure: BIOPSY;  Surgeon: Lanelle Bal, DO;  [facility];  Service: Endoscopy;;   BIOPSY  05/03/2022   Procedure: BIOPSY;  Surgeon: Lanelle Bal, DO;  [facility];  Service: Endoscopy;;   CARPAL TUNNEL RELEASE Right 10/29/2013   COLONOSCOPY N/A 08/27/2015   Procedure: COLONOSCOPY;  Surgeon: West Bali, MD;  Location: [facility];  Service: Endoscopy;  Laterality: N/A;  0830   COLONOSCOPY WITH PROPOFOL N/A 11/16/2021   Surgeon: Lanelle Bal, DO;  Nonbleeding internal hemorrhoids, otherwise normal exam.  Recommended 10-year screening colonoscopy.   CYST EXCISION  10/25/2010   right hand   CYSTOSCOPY W/ URETERAL STENT PLACEMENT Right 12/22/2015   Procedure: CYSTOSCOPY WITH RETROGRADE PYELOGRAM/URETERAL STENT PLACEMENT;  Surgeon: Hildred Laser, MD;   Location: [facility];  Service: Urology;  Laterality: Right;   CYSTOSCOPY WITH RETROGRADE PYELOGRAM, URETEROSCOPY AND STENT PLACEMENT Right 01/05/2016   Procedure: CYSTOSCOPY WITH RETROGRADE PYELOGRAM, URETEROSCOPY AND STENT REPLACEMENT;  Surgeon: Hildred Laser, MD;  Location: [facility];  Service: Urology;  Laterality: Right;   DILATION AND CURETTAGE OF UTERUS  03/12/2009   w/  Suction   double balloon enteroscopy     Dr. Gwinda Passe at [facility]: no erosions, no evidence of Crohn's disease, path without Crohn's.    EGD with push enteroscopy  02/12/2010    patent distal peptic stricture with diffuse antral erythema, normal D1 and D2    ESOPHAGOGASTRODUODENOSCOPY (EGD) WITH PROPOFOL N/A 11/16/2021   Surgeon: Lanelle Bal, DO;   Gastritis biopsied, otherwise normal exam.  Pathology with mild chronic gastritis, negative for H. pylori.   ESOPHAGOGASTRODUODENOSCOPY (EGD) WITH PROPOFOL N/A 05/03/2022   Surgeon: Lanelle Bal, DO;  Normal esophagus s/p empiric dilation, gastritis biopsied.  Pathology with mild chronic, focally active gastritis, negative for H. pylori.   HAND SURGERY     LAPAROSCOPIC ASSISTED VAGINAL HYSTERECTOMY  10/26/2014   w/  Bilateral Salpingoophorectomy   STONE EXTRACTION WITH BASKET Right 01/05/2016   Procedure: STONE EXTRACTION WITH BASKET;  Surgeon: Hildred Laser, MD;  Location: [facility];  Service: Urology;  Laterality: Right;   TONSILLECTOMY  1998  approx    SOCIAL HISTORY:  Social History   Socioeconomic History   Marital status: Single    Spouse name: Not on file   Number of children: Not on file   Years of education: Not on file   Highest education level: Not on file  Occupational History   Not on file  Tobacco Use   Smoking status: Never    Passive exposure: Past   Smokeless tobacco: Never  Vaping Use   Vaping status: Never Used  Substance and Sexual Activity   Alcohol use: Not Currently   Drug use: No    Sexual activity: Yes  Other Topics Concern   Not on file  Social History Narrative   Not on file   Social Determinants of Health   Financial Resource Strain: Low Risk  (06/30/2021)   Overall Financial Resource Strain (CARDIA)    Difficulty of Paying Living Expenses: Not hard at all  Food Insecurity: No Food Insecurity (12/29/2022)   Received from [facility], [facility]   Hunger Vital Sign    Worried About Running Out of Food in the Last Year: Never true    Ran Out of Food in the Last Year: Never true  Transportation Needs: No Transportation Needs (12/29/2022)   Received from [facility], [facility]   [facility] - Transportation    In the past 12 months, has lack of transportation kept you from medical appointments or from getting medications?: No    Lack of Transportation (Non-Medical): No  Physical Activity: Inactive (06/30/2021)   Exercise Vital Sign    Days of Exercise  per Week: 0 days    Minutes of Exercise per Session: 0 min  Stress: Stress Concern Present (06/30/2021)   [employer] of Occupational Health - Occupational Stress Questionnaire    Feeling of Stress : Very much  Social Connections: Socially Isolated (06/30/2021)   Social Connection and Isolation Panel [NHANES]    Frequency of Communication with Friends and Family: More than three times a week    Frequency of Social Gatherings with Friends and Family: More than three times a week    Attends Religious Services: Never    Database administrator or Organizations: No    Attends Banker Meetings: Never    Marital Status: Never married  Intimate Partner Violence: Not At Risk (06/30/2021)   Humiliation, Afraid, Rape, and Kick questionnaire    Fear of Current or Ex-Partner: No    Emotionally Abused: No    Physically Abused: No    Sexually Abused: No    FAMILY HISTORY:  Family History  Problem Relation Age of Onset   Hypertension Mother     Thyroid disease Mother    Diabetes Mother    Hyperlipidemia Mother    Stroke Mother    Heart disease Mother    Hyperlipidemia Father    Hypertension Father    Heart attack Father    Heart failure Father    Dementia Maternal Aunt    Dementia Paternal Aunt    Colon cancer Neg Hx     CURRENT MEDICATIONS:  Current Outpatient Medications  Medication Sig Dispense Refill   albuterol (VENTOLIN HFA) 108 (90 Base) MCG/ACT inhaler Inhale 2 puffs into the lungs every 6 (six) hours as needed for wheezing or shortness of breath. 8 g 6   BD PEN NEEDLE NANO 2ND GEN 32G X 4 MM MISC USE AS DIRECTED 100 each 0   busPIRone (BUSPAR) 5 MG tablet TAKE 1 TABLET BY MOUTH THREE TIMES DAILY 90 tablet 1   calcium carbonate (TUMS - DOSED IN MG ELEMENTAL CALCIUM) 500 MG chewable tablet Chew 1,500 mg by mouth 2 (two) times daily as needed for indigestion or heartburn.     Chromium 1000 MCG TABS Take 1,000 mcg by mouth daily.     Continuous Blood Gluc Sensor (FREESTYLE LIBRE 2 SENSOR) MISC Use sensor as directed. Change every 10-14 days. 2 each 3   cyclobenzaprine (FLEXERIL) 10 MG tablet Take by mouth.     EPINEPHrine 0.3 mg/0.3 mL IJ SOAJ injection Inject 0.3 mg into the muscle as needed for anaphylaxis. 1 each 0   HUMIRA, 2 PEN, 40 MG/0.4ML PNKT INJECT 40MG  SUBCUTANEOUSLY EVERY 14 DAYS.     hydrochlorothiazide (HYDRODIURIL) 25 MG tablet Take 1 tablet by mouth once daily 90 tablet 0   HYDROcodone-acetaminophen (NORCO/VICODIN) 5-325 MG tablet Take 1-2 tablets by mouth 2 (two) times daily as needed for moderate pain or severe pain. 90 tablet 0   lansoprazole (PREVACID) 30 MG capsule Take 1 capsule (30 mg total) by mouth 2 (two) times daily before a meal. 60 capsule 3   losartan (COZAAR) 100 MG tablet Take 1 tablet by mouth once daily 90 tablet 1   methotrexate (RHEUMATREX) 2.5 MG tablet Take 8 tablets (20 mg total) by mouth once a week. 64 tablet 0   ondansetron (ZOFRAN) 4 MG tablet Take 4 mg by mouth every 8  (eight) hours as needed.     OZEMPIC, 0.25 OR 0.5 MG/DOSE, 2 MG/3ML SOPN INJECT 0.5 MG INTO THE SKIN ONCE A WEEK 3 mL  0   Pancrelipase, Lip-Prot-Amyl, (ZENPEP) 40000-126000 units CPEP Take 2 capsules (80,000 Units total) by mouth 3 (three) times daily with meals. 1 capsule with snacks. 240 capsule 3   PARoxetine (PAXIL) 10 MG tablet Take 1 tablet by mouth once daily 90 tablet 0   PARoxetine (PAXIL) 40 MG tablet TAKE 1 TABLET BY MOUTH ONCE DAILY IN THE MORNING 90 tablet 0   polyethylene glycol powder (GLYCOLAX/MIRALAX) 17 GM/SCOOP powder Take two capfuls daily in 12-16 ounces of water. Once bowel movements are soft, you can drop down to one capful daily. 255 g 3   Probiotic Product (PROBIOTIC PO) Take 1 capsule by mouth daily. (Patient not taking: Reported on 05/25/2023)     psyllium (REGULOID) 0.52 g capsule Take 1 capsule by mouth daily.     sucralfate (CARAFATE) 1 g tablet Take 1 tablet (1 g total) by mouth 4 (four) times daily -  with meals and at bedtime. 120 tablet 1   SUMAtriptan (IMITREX) 100 MG tablet Take 1 tablet at earliest onset of migraine.  May repeat in 2 hours if headache persists or recurs.  Maximum 2 tablets in 24 hours. 10 tablet 5   traZODone (DESYREL) 150 MG tablet Take 1 tablet (150 mg total) by mouth at bedtime as needed for sleep. 90 tablet 3   valACYclovir (VALTREX) 500 MG tablet Take 500 mg by mouth 2 (two) times daily as needed (outbreaks).     No current facility-administered medications for this visit.    ALLERGIES:  Allergies  Allergen Reactions   Sulfa Antibiotics Other (See Comments) and Hives    Burning sensation, flushing to skin  Other Reaction(s): Other (See Comments)   Topamax [Topiramate] Other (See Comments)    Developed kidney stones   Fluorouracil Swelling    Burning skin    PHYSICAL EXAM:  Performance status (ECOG): 0 - Asymptomatic  There were no vitals filed for this visit.  Wt Readings from Last 3 Encounters:  05/25/23 153 lb 6.4 oz (69.6  kg)  04/15/23 158 lb 6.4 oz (71.8 kg)  03/16/23 153 lb 3.2 oz (69.5 kg)   Physical Exam Constitutional:      Appearance: Normal appearance.  Cardiovascular:     Rate and Rhythm: Normal rate and regular rhythm.  Pulmonary:     Effort: Pulmonary effort is normal.     Breath sounds: Normal breath sounds.  Abdominal:     General: Bowel sounds are normal.     Palpations: Abdomen is soft.  Musculoskeletal:        General: No swelling. Normal range of motion.  Neurological:     Mental Status: She is alert and oriented to person, place, and time. Mental status is at baseline.      LABORATORY DATA:  I have reviewed the labs as listed.     Latest Ref Rng & Units 05/17/2023    2:58 PM 08/18/2022   11:31 AM 07/15/2022    3:11 PM  CBC  WBC 4.0 - 10.5 K/uL 6.5  6.0  7.0   Hemoglobin 12.0 - 15.0 g/dL 40.9  81.1  91.4   Hematocrit 36.0 - 46.0 % 39.1  38.0  39.2   Platelets 150 - 400 K/uL 250  313  351       Latest Ref Rng & Units 05/17/2023    2:58 PM 02/24/2023    1:34 PM 01/21/2023   12:53 PM  CMP  Glucose 70 - 99 mg/dL 782  89    BUN  6 - 20 mg/dL 13  11    Creatinine 4.78 - 1.00 mg/dL 2.95  6.21  3.08   Sodium 135 - 145 mmol/L 136  142    Potassium 3.5 - 5.1 mmol/L 3.4  4.2    Chloride 98 - 111 mmol/L 103  104    CO2 22 - 32 mmol/L 22  20    Calcium 8.9 - 10.3 mg/dL 9.1  9.5    Total Protein 6.5 - 8.1 g/dL 7.3  6.8    Total Bilirubin 0.3 - 1.2 mg/dL 0.4  0.7    Alkaline Phos 38 - 126 U/L 72  87    AST 15 - 41 U/L 26  28    ALT 0 - 44 U/L 25  27      DIAGNOSTIC IMAGING:  I have independently reviewed the scans and discussed with the patient. No results found.   ASSESSMENT:  1.  Multiple bone lesions: - Presentation with left hip pain since February 2022. - Left hip mass noticed in May 2022, tender and painful. - Low back pain started since 25 May 2021. - Ultrasound of the left upper thigh soft tissue on 04/08/2021 was negative with differential including large occult  subcutaneous lipoma. - MRI of the left femur with and without contrast on 05/14/2021-no soft tissue mass, fluid collection or hematoma at the site of clinical concern in the left lateral thigh.Indeterminate bone lesion in the S1 vertebral body, right sacrum and left posterior intertrochanteric region. - MRI of the pelvis on 06/24/2021 showed multiple bone lesions in the pelvis, lower lumbar spine consistent with metastatic disease. 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 Scharlene Gloss office. - She recently had a basal cell carcinoma of the nose and left temporal region resected. -PET scan on 07/10/2021 showed hypermetabolic lesions in the right iliac bone, right sacral ala, left frontal area.  Right lower paratracheal lymph node 10 mm, SUV 4.5.  Left lower paratracheal lymph node 10 mm SUV 5.5.  Symmetric moderate activity in the hilar lymph nodes difficult to assess on noncontrast CT.  No lung nodules. - Biopsy of the right posterior iliac bone on 07/21/2021 showed noncaseating granulomatous inflammation with no evidence of malignancy.  CD68 highlights the presence of granulomas. 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  2.  Social/family history: - She lives at home with her son.  Mom lives close by. 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  She is non-smoker. - Maternal aunt had throat cancer.  Another maternal aunt had bone cancer.  Another maternal aunt had pancreatic cancer.  Niece had ovarian cancer.  Paternal uncle had leukemia.   PLAN:  1.  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 Everardo All at [facility] sarcoidosis clinic.  - Started on Humira with the [facility] sarcoidosis clinic about a 8 months ago with some improvement. - MRI of the abdomen on  08/13/2022 showed 7 mm lesion in the pancreatic neck with hypoenhancement. - She has family history of pancreatic cancer in her maternal aunt. - Follow up CT pancreas on 12/31/22 showed focal fatty infiltration in the pancreatic head, reflecting a pseudo lesion rather than a true mass, corresponding to the prior CT abnormality. No follow-up is recommended. - Labs from 05/17/2023 are unremarkable.  Normal LFTs, LDH and CBC. - Recommend follow-up in 6 months with repeat labs.  2.  Occipital headache: - Saw neurology yesterday. Awaiting MRI of brain. Taking sumatriptan and will start emgality monthly.    PLAN SUMMARY: >> Reviewed labs.  >> RTC in 6 months for repeat labs a few days before and assessment.      Orders placed this encounter:  No orders of the defined types were placed in this encounter.  I spent 20 minutes dedicated to the care of this patient (face-to-face and non-face-to-face) on the date of the encounter to include what is described in the assessment and plan.  Durenda Hurt, NP 05/26/2023 1:00 PM

## 2023-05-30 ENCOUNTER — Other Ambulatory Visit: Payer: Self-pay | Admitting: Gastroenterology

## 2023-05-30 DIAGNOSIS — K219 Gastro-esophageal reflux disease without esophagitis: Secondary | ICD-10-CM

## 2023-05-31 ENCOUNTER — Other Ambulatory Visit (HOSPITAL_COMMUNITY): Payer: Self-pay

## 2023-05-31 ENCOUNTER — Telehealth: Payer: Self-pay | Admitting: Pharmacy Technician

## 2023-05-31 NOTE — Telephone Encounter (Signed)
Pharmacy Patient Advocate Encounter   Received notification from CoverMyMeds that prior authorization for [facility] 120MG  is required/requested.   Insurance verification completed.   The patient is insured through [facility] .   Per test claim: PA required; PA submitted to [facility] via CoverMyMeds Key/confirmation #/[facility] Status is pending

## 2023-05-31 NOTE — Telephone Encounter (Signed)
PA request has been Approved. New Encounter created for follow up. For additional info see Pharmacy Prior Auth telephone encounter from 05/31/23.

## 2023-05-31 NOTE — Telephone Encounter (Signed)
Pharmacy Patient Advocate Encounter  Received notification from [facility] that Prior Authorization for [facility] 120MG  has been APPROVED from 8.6.24 to 11.4.24  Copay is $4  PA #/Case ID/Reference #: [id number]

## 2023-06-01 NOTE — Telephone Encounter (Signed)
Patient advised of the PA approval.

## 2023-06-02 ENCOUNTER — Other Ambulatory Visit: Payer: Self-pay | Admitting: Nurse Practitioner

## 2023-06-02 DIAGNOSIS — E119 Type 2 diabetes mellitus without complications: Secondary | ICD-10-CM

## 2023-06-06 ENCOUNTER — Telehealth: Payer: Self-pay | Admitting: Family Medicine

## 2023-06-06 NOTE — Telephone Encounter (Signed)
[place]- patient has been taking with medicaid to get her hydrocodone approved. Here is the number you need to call to get approval for her medication. Patient states they have to ask some [phone] )prior approval number

## 2023-06-07 DIAGNOSIS — F410 Panic disorder [episodic paroxysmal anxiety] without agoraphobia: Secondary | ICD-10-CM | POA: Diagnosis not present

## 2023-06-07 DIAGNOSIS — F411 Generalized anxiety disorder: Secondary | ICD-10-CM | POA: Diagnosis not present

## 2023-06-07 DIAGNOSIS — F331 Major depressive disorder, recurrent, moderate: Secondary | ICD-10-CM | POA: Diagnosis not present

## 2023-06-08 ENCOUNTER — Telehealth: Payer: Self-pay | Admitting: Neurology

## 2023-06-08 MED ORDER — EMGALITY 120 MG/ML ~~LOC~~ SOAJ: 120.0000 mg | SUBCUTANEOUS | 11 refills | Status: DC

## 2023-06-08 NOTE — Telephone Encounter (Signed)
Caller stated she has done her first 2 doses of Emgality stater pack today. Needs the refill now Pharmacy - [facility] [place]

## 2023-06-08 NOTE — Telephone Encounter (Signed)
Refilled as requested  

## 2023-06-09 NOTE — Telephone Encounter (Signed)
Medication PA was submitted and denied by insurance- denial was placed on provider's desk. Attempted to contact the number below 3 times and was unable to get past automated system to speak representative and then the calls were disconnected    Left message to return call to notify patient

## 2023-06-09 NOTE — Telephone Encounter (Signed)
Patient notified and stated she will call insurance back and see if she can get the number for the peer to peer

## 2023-06-15 DIAGNOSIS — F411 Generalized anxiety disorder: Secondary | ICD-10-CM | POA: Diagnosis not present

## 2023-06-15 DIAGNOSIS — F331 Major depressive disorder, recurrent, moderate: Secondary | ICD-10-CM | POA: Diagnosis not present

## 2023-06-15 DIAGNOSIS — F410 Panic disorder [episodic paroxysmal anxiety] without agoraphobia: Secondary | ICD-10-CM | POA: Diagnosis not present

## 2023-06-22 ENCOUNTER — Other Ambulatory Visit: Payer: Self-pay | Admitting: Family Medicine

## 2023-06-26 ENCOUNTER — Other Ambulatory Visit: Payer: Self-pay | Admitting: Nurse Practitioner

## 2023-06-26 DIAGNOSIS — E119 Type 2 diabetes mellitus without complications: Secondary | ICD-10-CM

## 2023-07-11 ENCOUNTER — Other Ambulatory Visit: Payer: Medicaid Other

## 2023-07-13 DIAGNOSIS — F410 Panic disorder [episodic paroxysmal anxiety] without agoraphobia: Secondary | ICD-10-CM | POA: Diagnosis not present

## 2023-07-13 DIAGNOSIS — F411 Generalized anxiety disorder: Secondary | ICD-10-CM | POA: Diagnosis not present

## 2023-07-13 DIAGNOSIS — F331 Major depressive disorder, recurrent, moderate: Secondary | ICD-10-CM | POA: Diagnosis not present

## 2023-07-15 DIAGNOSIS — D860 Sarcoidosis of lung: Secondary | ICD-10-CM | POA: Diagnosis not present

## 2023-07-15 DIAGNOSIS — Z8739 Personal history of other diseases of the musculoskeletal system and connective tissue: Secondary | ICD-10-CM | POA: Diagnosis not present

## 2023-07-15 DIAGNOSIS — M160 Bilateral primary osteoarthritis of hip: Secondary | ICD-10-CM | POA: Diagnosis not present

## 2023-07-15 DIAGNOSIS — M797 Fibromyalgia: Secondary | ICD-10-CM | POA: Diagnosis not present

## 2023-07-15 DIAGNOSIS — M25552 Pain in left hip: Secondary | ICD-10-CM | POA: Diagnosis not present

## 2023-07-15 DIAGNOSIS — M25551 Pain in right hip: Secondary | ICD-10-CM | POA: Diagnosis not present

## 2023-07-21 DIAGNOSIS — C44329 Squamous cell carcinoma of skin of other parts of face: Secondary | ICD-10-CM | POA: Diagnosis not present

## 2023-07-25 ENCOUNTER — Telehealth: Payer: Self-pay

## 2023-07-25 NOTE — Telephone Encounter (Signed)
Medicaid Managed Care   Unsuccessful Outreach Note  07/25/2023 Name: Court[place] MRN: [id number] DOB: [birth year]  Referred by: Tommie Sams, DO Reason for referral : No chief complaint on file.   An unsuccessful telephone outreach was attempted today. The patient was referred to the case management team for assistance with care management and care coordination.   Follow Up Plan: If patient returns call to provider office, please advise to call Embedded Care Management Care Guide Nicholes Rough* at [phone]*  Nicholes Rough, CMA Care Guide [employer]

## 2023-07-27 ENCOUNTER — Other Ambulatory Visit: Payer: Self-pay | Admitting: Nurse Practitioner

## 2023-07-27 DIAGNOSIS — E119 Type 2 diabetes mellitus without complications: Secondary | ICD-10-CM

## 2023-08-09 DIAGNOSIS — F332 Major depressive disorder, recurrent severe without psychotic features: Secondary | ICD-10-CM | POA: Diagnosis not present

## 2023-08-09 DIAGNOSIS — F410 Panic disorder [episodic paroxysmal anxiety] without agoraphobia: Secondary | ICD-10-CM | POA: Diagnosis not present

## 2023-08-09 DIAGNOSIS — F411 Generalized anxiety disorder: Secondary | ICD-10-CM | POA: Diagnosis not present

## 2023-08-10 ENCOUNTER — Emergency Department (HOSPITAL_COMMUNITY)
Admission: EM | Admit: 2023-08-10 | Discharge: 2023-08-11 | Disposition: A | Discharge status: Home / Self Care | Payer: Medicaid Other | Attending: Emergency Medicine | Admitting: Emergency Medicine

## 2023-08-10 ENCOUNTER — Emergency Department (HOSPITAL_COMMUNITY): Payer: Medicaid Other

## 2023-08-10 ENCOUNTER — Other Ambulatory Visit: Payer: Self-pay

## 2023-08-10 ENCOUNTER — Encounter (HOSPITAL_COMMUNITY): Payer: Self-pay

## 2023-08-10 DIAGNOSIS — G43001 Migraine without aura, not intractable, with status migrainosus: Secondary | ICD-10-CM | POA: Insufficient documentation

## 2023-08-10 DIAGNOSIS — R519 Headache, unspecified: Secondary | ICD-10-CM | POA: Diagnosis not present

## 2023-08-10 DIAGNOSIS — G43009 Migraine without aura, not intractable, without status migrainosus: Secondary | ICD-10-CM | POA: Diagnosis not present

## 2023-08-10 HISTORY — DX: Fibromyalgia: M79.7

## 2023-08-10 LAB — BASIC METABOLIC PANEL
Anion gap: 5 (ref 5–15)
BUN: 14 mg/dL (ref 6–20)
CO2: 25 mmol/L (ref 22–32)
Calcium: 8.6 mg/dL — ABNORMAL LOW (ref 8.9–10.3)
Chloride: 109 mmol/L (ref 98–111)
Creatinine, Ser: 0.68 mg/dL (ref 0.44–1.00)
GFR, Estimated: >60 mL/min (ref >60)
Glucose, Bld: 98 mg/dL (ref 70–99)
Potassium: 3.8 mmol/L (ref 3.5–5.1)
Sodium: 139 mmol/L (ref 135–145)

## 2023-08-10 LAB — CBC WITH DIFFERENTIAL/PLATELET
Abs Immature Granulocytes: 0.01 10*3/uL (ref 0.00–0.07)
Basophils Absolute: 0.1 10*3/uL (ref 0.0–0.1)
Basophils Relative: 1 %
Eosinophils Absolute: 0.1 10*3/uL (ref 0.0–0.5)
Eosinophils Relative: 2 %
HCT: 33.5 % — ABNORMAL LOW (ref 36.0–46.0)
Hemoglobin: 11.5 g/dL — ABNORMAL LOW (ref 12.0–15.0)
Immature Granulocytes: 0 %
Lymphocytes Relative: 55 %
Lymphs Abs: 3.3 10*3/uL (ref 0.7–4.0)
MCH: 31.5 pg (ref 26.0–34.0)
MCHC: 34.3 g/dL (ref 30.0–36.0)
MCV: 91.8 fL (ref 80.0–100.0)
Monocytes Absolute: 0.4 10*3/uL (ref 0.1–1.0)
Monocytes Relative: 7 %
Neutro Abs: 2.1 10*3/uL (ref 1.7–7.7)
Neutrophils Relative %: 35 %
Platelets: 205 10*3/uL (ref 150–400)
RBC: 3.65 MIL/uL — ABNORMAL LOW (ref 3.87–5.11)
RDW: 14.2 % (ref 11.5–15.5)
WBC: 6 10*3/uL (ref 4.0–10.5)
nRBC: 0 % (ref 0.0–0.2)

## 2023-08-10 NOTE — ED Provider Notes (Signed)
11:16 PM Assumed care from Dr. Leanna Sato, please see their note for full history, physical and decision making until this point. In brief this is a 57 y.o. year old female who presented to the ED tonight with Headache     Here with headache, relatively chronic in nature but worsening and no previous imaging. Pending CT, reeval and disposition.   Discharge instructions, including strict return precautions for new or worsening symptoms, given. Patient and/or family verbalized understanding and agreement with the plan as described.   Labs, studies and imaging reviewed by myself and considered in medical decision making if ordered. Imaging interpreted by radiology.  Labs Reviewed  CBC WITH DIFFERENTIAL/PLATELET - Abnormal; Notable for the following components:      Result Value   RBC 3.65 (*)    Hemoglobin 11.5 (*)    HCT 33.5 (*)    All other components within normal limits  BASIC METABOLIC PANEL - Abnormal; Notable for the following components:   Calcium 8.6 (*)    All other components within normal limits    CT Head Wo Contrast    (Results Pending)    No follow-ups on file.

## 2023-08-10 NOTE — ED Triage Notes (Signed)
Pt c/o intermittent posterior head pain x3 months.  Pain score 8/10.  Pt reports "I had a brain scan ordered by a Neurologist, but it got cancelled by the hospital and I can't have it done for a few more months."  Pt reports having sarcoidosis and thinks this was the reason for the "scan."      Pt has been taking prescribed migraine medication w/o relief.

## 2023-08-10 NOTE — ED Notes (Signed)
Pt back form CT

## 2023-08-10 NOTE — ED Notes (Signed)
Patient transported to CT 

## 2023-08-11 ENCOUNTER — Telehealth: Payer: Self-pay | Admitting: Neurology

## 2023-08-11 NOTE — Telephone Encounter (Signed)
CT ordered and done at the ED last night, Please advised of MRI is still needed.

## 2023-08-11 NOTE — Telephone Encounter (Signed)
Patient is calling about a MRI order

## 2023-08-12 NOTE — Telephone Encounter (Signed)
Patient advised of Dr.Jaffe, I would still like her to get the MRI because the CT may miss some findings.

## 2023-08-18 NOTE — ED Provider Notes (Signed)
[place] [facility]ENC[facility]PARTMENT AT [facility] Provider Note   CSN: [id number] Arrival date & time: 08/10/23  1751     History  Chief Complaint  Patient presents with   Headache    Courtney Hale is a 57 y.o. female.  Patient is a 57 year old female with past medical history as listed below including sarcoidosis presenting for complaints of headache.  Patient admits to headache located in the posterior region, without radiation, 8 out of 10 pain severity, x 3 days.  She states she is following with her neurologist who ordered an outpatient MRI but she has not gotten it at this time.  She is waiting for insurance approval.  She denies any sensation or motor dysfunction.  She denies recent falls, blunt head trauma, or blood thinner use.  The history is provided by the patient. No language interpreter was used.  Headache Associated symptoms: no abdominal pain, no back pain, no cough, no ear pain, no eye pain, no fever, no seizures, no sore throat and no vomiting        Home Medications Prior to Admission medications   Medication Sig Start Date End Date Taking? Authorizing Provider  albuterol (VENTOLIN HFA) 108 (90 Base) MCG/ACT inhaler Inhale 2 puffs into the lungs every 6 (six) hours as needed for wheezing or shortness of breath. 08/27/21   Luciano Cutter, MD  BD PEN NEEDLE NANO 2ND GEN 32G X 4 MM MISC USE AS DIRECTED 10/13/22   Everlene Other G, DO  busPIRone (BUSPAR) 5 MG tablet TAKE 1 TABLET BY MOUTH THREE TIMES DAILY 10/14/22   Tommie Sams, DO  calcium carbonate (TUMS - DOSED IN MG ELEMENTAL CALCIUM) 500 MG chewable tablet Chew 1,500 mg by mouth 2 (two) times daily as needed for indigestion or heartburn.    [provider]  Chromium 1000 MCG TABS Take 1,000 mcg by mouth daily.    [provider]  Continuous Blood Gluc Sensor (FREESTYLE LIBRE 2 SENSOR) MISC Use sensor as directed. Change every 10-14 days. 12/23/22   Roma Kayser, MD   cyclobenzaprine (FLEXERIL) 10 MG tablet Take by mouth. 10/01/22   [provider]  EPINEPHrine 0.3 mg/0.3 mL IJ SOAJ injection Inject 0.3 mg into the muscle as needed for anaphylaxis. 10/16/21   Loeffler, Finis Bud, PA-C  Galcanezumab-gnlm (EMGALITY) 120 MG/ML SOAJ Inject 120 mg into the skin every 28 (twenty-eight) days. 06/08/23   Jaffe, Adam R, DO  HUMIRA, 2 PEN, 40 MG/0.4ML PNKT INJECT 40MG  SUBCUTANEOUSLY EVERY 14 DAYS. 10/01/22   [provider]  hydrochlorothiazide (HYDRODIURIL) 25 MG tablet Take 1 tablet by mouth once daily 03/29/23   Tommie Sams, DO  HYDROcodone-acetaminophen (NORCO/VICODIN) 5-325 MG tablet Take 1-2 tablets by mouth 2 (two) times daily as needed for moderate pain or severe pain. 05/03/23   Campbell Riches, NP  lansoprazole (PREVACID) 30 MG capsule TAKE 1 CAPSULE BY MOUTH TWICE DAILY BEFORE A MEAL 05/30/23   Tiffany Kocher, PA-C  losartan (COZAAR) 100 MG tablet Take 1 tablet by mouth once daily 06/28/23   Tommie Sams, DO  methotrexate (RHEUMATREX) 2.5 MG tablet Take 8 tablets (20 mg total) by mouth once a week. 02/15/23   Luciano Cutter, MD  ondansetron (ZOFRAN) 4 MG tablet Take 4 mg by mouth every 8 (eight) hours as needed. 02/08/23   [provider]  Pancrelipase, Lip-Prot-Amyl, (ZENPEP) 40000-126000 units CPEP Take 2 capsules (80,000 Units total) by mouth 3 (three) times daily with  meals. 1 capsule with snacks. 02/07/23   Gelene Mink, NP  PARoxetine (PAXIL) 10 MG tablet Take 1 tablet by mouth once daily 03/29/23   Everlene Other G, DO  PARoxetine (PAXIL) 40 MG tablet TAKE 1 TABLET BY MOUTH ONCE DAILY IN THE MORNING 01/12/23   Everlene Other G, DO  polyethylene glycol powder (GLYCOLAX/MIRALAX) 17 GM/SCOOP powder Take two capfuls daily in 12-16 ounces of water. Once bowel movements are soft, you can drop down to one capful daily. 03/16/23   Tiffany Kocher, PA-C  Probiotic Product (PROBIOTIC PO) Take 1 capsule by mouth daily.    [provider]   psyllium (REGULOID) 0.52 g capsule Take 1 capsule by mouth daily. 02/08/23   [provider]  Semaglutide,0.25 or 0.5MG /DOS, (OZEMPIC, 0.25 OR 0.5 MG/DOSE,) 2 MG/3ML SOPN INJECT 0.5 MG INTO THE SKIN ONCE WEEKLY 07/27/23   Dani Gobble, NP  sucralfate (CARAFATE) 1 g tablet Take 1 tablet (1 g total) by mouth 4 (four) times daily -  with meals and at bedtime. 07/15/22   Letta Median, PA-C  SUMAtriptan (IMITREX) 100 MG tablet Take 1 tablet at earliest onset of migraine.  May repeat in 2 hours if headache persists or recurs.  Maximum 2 tablets in 24 hours. 05/25/23   Drema Dallas, DO  traZODone (DESYREL) 150 MG tablet Take 1 tablet (150 mg total) by mouth at bedtime as needed for sleep. 07/15/22   Tommie Sams, DO  valACYclovir (VALTREX) 500 MG tablet Take 500 mg by mouth 2 (two) times daily as needed (outbreaks). 06/05/21   [provider]      Allergies    Sulfa antibiotics, Topamax [topiramate], and Fluorouracil    Review of Systems   Review of Systems  Constitutional:  Negative for chills and fever.  HENT:  Negative for ear pain and sore throat.   Eyes:  Negative for pain and visual disturbance.  Respiratory:  Negative for cough and shortness of breath.   Cardiovascular:  Negative for chest pain and palpitations.  Gastrointestinal:  Negative for abdominal pain and vomiting.  Genitourinary:  Negative for dysuria and hematuria.  Musculoskeletal:  Negative for arthralgias and back pain.  Skin:  Negative for color change and rash.  Neurological:  Positive for headaches. Negative for seizures and syncope.  All other systems reviewed and are negative.   Physical Exam Updated Vital Signs BP 115/87   Pulse 67   Temp 98.8 F (37.1 C)   Resp 18   Ht 5\' 3"  (1.6 m)   Wt 68 kg   LMP 10/26/2014   SpO2 99%   BMI 26.57 kg/m  Physical Exam Vitals and nursing note reviewed.  Constitutional:      General: She is not in acute distress.    Appearance: She is  well-developed.  HENT:     Head: Normocephalic and atraumatic.  Eyes:     General: Lids are normal. Vision grossly intact.     Conjunctiva/sclera: Conjunctivae normal.     Pupils: Pupils are equal, round, and reactive to light.  Cardiovascular:     Rate and Rhythm: Normal rate and regular rhythm.     Heart sounds: No murmur heard. Pulmonary:     Effort: Pulmonary effort is normal. No respiratory distress.     Breath sounds: Normal breath sounds.  Abdominal:     Palpations: Abdomen is soft.     Tenderness: There is no abdominal tenderness.  Musculoskeletal:  General: No swelling.     Cervical back: Neck supple.  Skin:    General: Skin is warm and dry.     Capillary Refill: Capillary refill takes less than 2 seconds.  Neurological:     General: No focal deficit present.     Mental Status: She is alert and oriented to person, place, and time.     GCS: GCS eye subscore is 4. GCS verbal subscore is 5. GCS motor subscore is 6.     Cranial Nerves: Cranial nerves 2-12 are intact.     Sensory: Sensation is intact.     Motor: Motor function is intact.     Coordination: Coordination is intact.     Gait: Gait is intact.  Psychiatric:        Mood and Affect: Mood normal.     ED Results / Procedures / Treatments   Labs (all labs ordered are listed, but only abnormal results are displayed) Labs Reviewed  CBC WITH DIFFERENTIAL/PLATELET - Abnormal; Notable for the following components:      Result Value   RBC 3.65 (*)    Hemoglobin 11.5 (*)    HCT 33.5 (*)    All other components within normal limits  BASIC METABOLIC PANEL - Abnormal; Notable for the following components:   Calcium 8.6 (*)    All other components within normal limits    EKG None  Radiology No results found.  Procedures Procedures    Medications Ordered in ED Medications - No data to display  ED Course/ Medical Decision Making/ A&P                                 Medical Decision Making Amount  and/or Complexity of Data Reviewed Labs: ordered. Radiology: ordered.   59:43 AM 57 year old female with past medical history as listed below presenting for complaints of headache.  Patient is alert and oriented x 3, no acute distress, afebrile, stable vital signs.  No neurovascular deficits on exam.  CT head ordered.  Recommendations for headache cocktail after normal CT results.  Patient still recommended for her MRI scan.  Signed out to oncoming physician while waiting imaging.        Final Clinical Impression(s) / ED Diagnoses Final diagnoses:  Migraine without aura and with status migrainosus, not intractable    Rx / DC Orders ED Discharge Orders     None         Franne Forts, DO 08/18/23 0981

## 2023-09-01 ENCOUNTER — Ambulatory Visit: Payer: Medicaid Other | Admitting: Nurse Practitioner

## 2023-09-05 ENCOUNTER — Other Ambulatory Visit: Payer: Medicaid Other

## 2023-09-06 DIAGNOSIS — F410 Panic disorder [episodic paroxysmal anxiety] without agoraphobia: Secondary | ICD-10-CM | POA: Diagnosis not present

## 2023-09-06 DIAGNOSIS — F411 Generalized anxiety disorder: Secondary | ICD-10-CM | POA: Diagnosis not present

## 2023-09-06 DIAGNOSIS — F332 Major depressive disorder, recurrent severe without psychotic features: Secondary | ICD-10-CM | POA: Diagnosis not present

## 2023-09-15 DIAGNOSIS — F411 Generalized anxiety disorder: Secondary | ICD-10-CM | POA: Diagnosis not present

## 2023-09-15 DIAGNOSIS — F410 Panic disorder [episodic paroxysmal anxiety] without agoraphobia: Secondary | ICD-10-CM | POA: Diagnosis not present

## 2023-09-15 DIAGNOSIS — F332 Major depressive disorder, recurrent severe without psychotic features: Secondary | ICD-10-CM | POA: Diagnosis not present

## 2023-09-20 DIAGNOSIS — Z23 Encounter for immunization: Secondary | ICD-10-CM | POA: Diagnosis not present

## 2023-09-20 DIAGNOSIS — R9431 Abnormal electrocardiogram [ECG] [EKG]: Secondary | ICD-10-CM | POA: Diagnosis not present

## 2023-09-20 DIAGNOSIS — D869 Sarcoidosis, unspecified: Secondary | ICD-10-CM | POA: Diagnosis not present

## 2023-09-20 DIAGNOSIS — R0789 Other chest pain: Secondary | ICD-10-CM | POA: Diagnosis not present

## 2023-09-20 DIAGNOSIS — Z87891 Personal history of nicotine dependence: Secondary | ICD-10-CM | POA: Diagnosis not present

## 2023-09-26 ENCOUNTER — Inpatient Hospital Stay: Payer: Medicaid Other | Attending: Hematology | Admitting: Hematology

## 2023-09-26 ENCOUNTER — Encounter: Payer: Self-pay | Admitting: Neurology

## 2023-09-26 VITALS — BP 110/80 | HR 63 | Temp 98.0°F | Resp 16 | Wt 154.0 lb

## 2023-09-26 DIAGNOSIS — Z8041 Family history of malignant neoplasm of ovary: Secondary | ICD-10-CM | POA: Diagnosis not present

## 2023-09-26 DIAGNOSIS — Z806 Family history of leukemia: Secondary | ICD-10-CM | POA: Insufficient documentation

## 2023-09-26 DIAGNOSIS — Z800 Family history of malignant neoplasm of digestive organs: Secondary | ICD-10-CM | POA: Diagnosis not present

## 2023-09-26 DIAGNOSIS — Z808 Family history of malignant neoplasm of other organs or systems: Secondary | ICD-10-CM | POA: Insufficient documentation

## 2023-09-26 DIAGNOSIS — D869 Sarcoidosis, unspecified: Secondary | ICD-10-CM | POA: Diagnosis not present

## 2023-09-26 NOTE — Progress Notes (Signed)
[facility] [address], [place] [address]    Clinic Day:  09/26/2023  Referring physician: Tommie Sams, DO  Patient Care Team: Tommie Sams, DO as PCP - General (Family Medicine) West Bali, MD (Inactive) as Consulting Physician (Gastroenterology) Therese Sarah, RN as Oncology Nurse Navigator (Oncology) Doreatha Massed, MD as Medical Oncologist (Oncology)   ASSESSMENT & PLAN:   Assessment: 1.  Multiple bone lesions: - Presentation with left hip pain since February 2022. - Left hip mass noticed in May 2022, tender and painful. - Low back pain started since 25 May 2021. - Ultrasound of the left upper thigh soft tissue on 04/08/2021 was negative with differential including large occult subcutaneous lipoma. - MRI of the left femur with and without contrast on 05/14/2021-no soft tissue mass, fluid collection or hematoma at the site of clinical concern in the left lateral thigh.Indeterminate bone lesion in the S1 vertebral body, right sacrum and left posterior intertrochanteric region. - MRI of the pelvis on 06/24/2021 showed multiple bone lesions in the pelvis, lower lumbar spine consistent with metastatic disease. 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 Scharlene Gloss office. - She recently had a basal cell carcinoma of the nose and left temporal region resected. -PET scan on 07/10/2021 showed hypermetabolic lesions in the right iliac bone, right sacral ala, left frontal area.  Right lower paratracheal lymph node 10 mm, SUV 4.5.  Left lower paratracheal lymph node 10 mm SUV 5.5.  Symmetric moderate activity in the hilar lymph nodes difficult to assess on noncontrast CT.  No lung nodules. - Biopsy of the right posterior iliac bone on 07/21/2021 showed noncaseating granulomatous inflammation with no evidence of malignancy.  CD68 highlights the presence of  granulomas. 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  2.  Social/family history: - She lives at home with her son.  Mom lives close by. 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  She is non-smoker. - Maternal aunt had throat cancer.  Another maternal aunt had bone cancer.  Another maternal aunt had pancreatic cancer.  Niece had ovarian cancer.  Paternal uncle had leukemia.    Plan: 1.  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 - Rheumatologist has decreased her methotrexate to 4 tablets/week on 07/15/2023.  She is continuing Humira 40 mg every 2 weeks. - She reported having left-sided neck pain and swelling for 4 weeks.  Also reported pain in the right sacroiliac area. - Physical exam: No evidence of lymphadenopathy in the left neck region. - Previous scans showed right sacroiliac uptake from sarcoidosis. - I have recommended that she follow-up with her rheumatologist. - RTC as needed in our clinic.     No orders of the defined types were placed in this encounter.     I,Katie Daubenspeck,acting as a Neurosurgeon for Doreatha Massed, MD.,have documented all relevant documentation on the behalf of Doreatha Massed, MD,as directed by  Doreatha Massed, MD while in the presence of Doreatha Massed, MD.   I, Doreatha Massed MD, have reviewed the above documentation for accuracy and completeness, and I agree with the above.   Doreatha Massed, MD   12/2/20244:03 PM  CHIEF COMPLAINT:   Diagnosis: multiple bone lesions/lymphadenopathy/sarcoidosis    Cancer Staging  No matching staging information was found for the patient.    Prior Therapy: none  Current Therapy:  Methotrexate    HISTORY OF PRESENT ILLNESS:   Oncology History   No history exists.     INTERVAL HISTORY:   Cynthea is a 57 y.o. female presenting  to clinic today for follow up of multiple bone lesions/lymphadenopathy/sarcoidosis. She was last seen by NP Victorino Dike on 05/26/23.  Today, she states that she is doing well overall. Her appetite level is at 75%. Her energy level is at 25%.  PAST MEDICAL HISTORY:   Past Medical History: Past Medical History:  Diagnosis Date   Cancer (HCC)    Skin   Complication of anesthesia    woke up during endoscopy and colonoscopy   Depression    Diabetes (HCC)    Fibromyalgia    GERD (gastroesophageal reflux disease)    Hemorrhoids    History of esophageal stricture    2011  peptic w/  dilatation   History of gastritis    2011   History of melanoma excision    2015  left leg/   12-09-2015 back    Hypertension    Microhematuria    Migraines    Nephrolithiasis    left side non-obstructive    Right ureteral stone    Sarcoidosis 07/24/2021   Urgency of urination    Wears contact lenses     Surgical History: Past Surgical History:  Procedure Laterality Date   ABDOMINAL HYSTERECTOMY     BALLOON DILATION N/A 05/03/2022   Procedure: BALLOON DILATION;  Surgeon: Lanelle Bal, DO;  [facility];  Service: Endoscopy;  Laterality: N/A;   BIOPSY  11/16/2021   Procedure: BIOPSY;  Surgeon: Lanelle Bal, DO;  [facility];  Service: Endoscopy;;   BIOPSY  05/03/2022   Procedure: BIOPSY;  Surgeon: Lanelle Bal, DO;  [facility];  Service: Endoscopy;;   CARPAL TUNNEL RELEASE Right 10/29/2013   COLONOSCOPY N/A 08/27/2015   Procedure: COLONOSCOPY;  Surgeon: West Bali, MD;  Location: [facility];  Service: Endoscopy;  Laterality: N/A;  0830   COLONOSCOPY WITH PROPOFOL N/A 11/16/2021   Surgeon: Lanelle Bal, DO;  Nonbleeding internal hemorrhoids, otherwise normal exam.  Recommended 10-year screening colonoscopy.   CYST EXCISION  10/25/2010   right hand   CYSTOSCOPY W/ URETERAL STENT PLACEMENT Right 12/22/2015   Procedure: CYSTOSCOPY WITH  RETROGRADE PYELOGRAM/URETERAL STENT PLACEMENT;  Surgeon: Hildred Laser, MD;  Location: [facility];  Service: Urology;  Laterality: Right;   CYSTOSCOPY WITH RETROGRADE PYELOGRAM, URETEROSCOPY AND STENT PLACEMENT Right 01/05/2016   Procedure: CYSTOSCOPY WITH RETROGRADE PYELOGRAM, URETEROSCOPY AND STENT REPLACEMENT;  Surgeon: Hildred Laser, MD;  Location: [facility];  Service: Urology;  Laterality: Right;   DILATION AND CURETTAGE OF UTERUS  03/12/2009   w/  Suction   double balloon enteroscopy     Dr. Gwinda Passe at [facility]: no erosions, no evidence of Crohn's disease, path without Crohn's.    EGD with push enteroscopy  02/12/2010    patent distal peptic stricture with diffuse antral erythema, normal D1 and D2    ESOPHAGOGASTRODUODENOSCOPY (EGD) WITH PROPOFOL N/A 11/16/2021   Surgeon: Lanelle Bal, DO;   Gastritis biopsied, otherwise normal exam.  Pathology with mild chronic gastritis, negative for H. pylori.   ESOPHAGOGASTRODUODENOSCOPY (EGD) WITH PROPOFOL N/A 05/03/2022   Surgeon: Lanelle Bal, DO;  Normal esophagus s/p empiric dilation, gastritis biopsied.  Pathology with mild  chronic, focally active gastritis, negative for H. pylori.   HAND SURGERY     LAPAROSCOPIC ASSISTED VAGINAL HYSTERECTOMY  10/26/2014   w/  Bilateral Salpingoophorectomy   STONE EXTRACTION WITH BASKET Right 01/05/2016   Procedure: STONE EXTRACTION WITH BASKET;  Surgeon: Hildred Laser, MD;  Location: [facility];  Service: Urology;  Laterality: Right;   TONSILLECTOMY  1998  approx    Social History: Social History   Socioeconomic History   Marital status: Single    Spouse name: Not on file   Number of children: Not on file   Years of education: Not on file   Highest education level: Not on file  Occupational History   Not on file  Tobacco Use   Smoking status: Never    Passive exposure: Past   Smokeless tobacco: Never  Vaping Use   Vaping status: Never Used   Substance and Sexual Activity   Alcohol use: Not Currently   Drug use: No   Sexual activity: Yes  Other Topics Concern   Not on file  Social History Narrative   Not on file   Social Determinants of Health   Financial Resource Strain: Low Risk  (06/30/2021)   Overall Financial Resource Strain (CARDIA)    Difficulty of Paying Living Expenses: Not hard at all  Food Insecurity: No Food Insecurity (12/29/2022)   Received from [facility], [facility]   Hunger Vital Sign    Worried About Running Out of Food in the Last Year: Never true    Ran Out of Food in the Last Year: Never true  Transportation Needs: No Transportation Needs (12/29/2022)   Received from [facility], [facility]   [facility] - Transportation    In the past 12 months, has lack of transportation kept you from medical appointments or from getting medications?: No    Lack of Transportation (Non-Medical): No  Physical Activity: Inactive (06/30/2021)   Exercise Vital Sign    Days of Exercise per Week: 0 days    Minutes of Exercise per Session: 0 min  Stress: Stress Concern Present (06/30/2021)   [employer] of Occupational Health - Occupational Stress Questionnaire    Feeling of Stress : Very much  Social Connections: Socially Isolated (06/30/2021)   Social Connection and Isolation Panel [NHANES]    Frequency of Communication with Friends and Family: More than three times a week    Frequency of Social Gatherings with Friends and Family: More than three times a week    Attends Religious Services: Never    Database administrator or Organizations: No    Attends Banker Meetings: Never    Marital Status: Never married  Intimate Partner Violence: Not At Risk (06/30/2021)   Humiliation, Afraid, Rape, and Kick questionnaire    Fear of Current or Ex-Partner: No    Emotionally Abused: No    Physically Abused: No    Sexually Abused: No    Family  History: Family History  Problem Relation Age of Onset   Hypertension Mother    Thyroid disease Mother    Diabetes Mother    Hyperlipidemia Mother    Stroke Mother    Heart disease Mother    Hyperlipidemia Father    Hypertension Father    Heart attack Father    Heart failure Father    Dementia Maternal Aunt    Dementia Paternal Aunt    Colon cancer Neg Hx  Current Medications:  Current Outpatient Medications:    albuterol (VENTOLIN HFA) 108 (90 Base) MCG/ACT inhaler, Inhale 2 puffs into the lungs every 6 (six) hours as needed for wheezing or shortness of breath., Disp: 8 g, Rfl: 6   BD PEN NEEDLE NANO 2ND GEN 32G X 4 MM MISC, USE AS DIRECTED, Disp: 100 each, Rfl: 0   buPROPion (WELLBUTRIN XL) 300 MG 24 hr tablet, Take 300 mg by mouth every morning., Disp: , Rfl:    busPIRone (BUSPAR) 5 MG tablet, TAKE 1 TABLET BY MOUTH THREE TIMES DAILY, Disp: 90 tablet, Rfl: 1   calcium carbonate (TUMS - DOSED IN MG ELEMENTAL CALCIUM) 500 MG chewable tablet, Chew 1,500 mg by mouth 2 (two) times daily as needed for indigestion or heartburn., Disp: , Rfl:    Chromium 1000 MCG TABS, Take 1,000 mcg by mouth daily., Disp: , Rfl:    clonazePAM (KLONOPIN) 0.5 MG tablet, Take 0.5 mg by mouth daily as needed., Disp: , Rfl:    Continuous Blood Gluc Sensor (FREESTYLE LIBRE 2 SENSOR) MISC, Use sensor as directed. Change every 10-14 days., Disp: 2 each, Rfl: 3   cyclobenzaprine (FLEXERIL) 10 MG tablet, Take by mouth., Disp: , Rfl:    EPINEPHrine 0.3 mg/0.3 mL IJ SOAJ injection, Inject 0.3 mg into the muscle as needed for anaphylaxis., Disp: 1 each, Rfl: 0   folic acid (FOLVITE) 1 MG tablet, SMARTSIG:1 Tablet(s) By Mouth Morning-Night, Disp: , Rfl:    Galcanezumab-gnlm (EMGALITY) 120 MG/ML SOAJ, Inject 120 mg into the skin every 28 (twenty-eight) days., Disp: 1.12 mL, Rfl: 11   HUMIRA, 2 PEN, 40 MG/0.4ML PNKT, INJECT 40MG  SUBCUTANEOUSLY EVERY 14 DAYS., Disp: , Rfl:    hydrochlorothiazide (HYDRODIURIL) 25 MG  tablet, Take 1 tablet by mouth once daily, Disp: 90 tablet, Rfl: 0   HYDROcodone-acetaminophen (NORCO/VICODIN) 5-325 MG tablet, Take 1-2 tablets by mouth 2 (two) times daily as needed for moderate pain or severe pain., Disp: 90 tablet, Rfl: 0   lansoprazole (PREVACID) 30 MG capsule, TAKE 1 CAPSULE BY MOUTH TWICE DAILY BEFORE A MEAL, Disp: 60 capsule, Rfl: 5   losartan (COZAAR) 100 MG tablet, Take 1 tablet by mouth once daily, Disp: 90 tablet, Rfl: 0   methotrexate (RHEUMATREX) 2.5 MG tablet, Take 8 tablets (20 mg total) by mouth once a week., Disp: 64 tablet, Rfl: 0   ondansetron (ZOFRAN) 4 MG tablet, Take 4 mg by mouth every 8 (eight) hours as needed., Disp: , Rfl:    Pancrelipase, Lip-Prot-Amyl, (ZENPEP) 40000-126000 units CPEP, Take 2 capsules (80,000 Units total) by mouth 3 (three) times daily with meals. 1 capsule with snacks., Disp: 240 capsule, Rfl: 3   PARoxetine (PAXIL) 10 MG tablet, Take 1 tablet by mouth once daily, Disp: 90 tablet, Rfl: 0   PARoxetine (PAXIL) 40 MG tablet, TAKE 1 TABLET BY MOUTH ONCE DAILY IN THE MORNING, Disp: 90 tablet, Rfl: 0   polyethylene glycol powder (GLYCOLAX/MIRALAX) 17 GM/SCOOP powder, Take two capfuls daily in 12-16 ounces of water. Once bowel movements are soft, you can drop down to one capful daily., Disp: 255 g, Rfl: 3   pregabalin (LYRICA) 25 MG capsule, Take by mouth., Disp: , Rfl:    Probiotic Product (PROBIOTIC PO), Take 1 capsule by mouth daily., Disp: , Rfl:    psyllium (REGULOID) 0.52 g capsule, Take 1 capsule by mouth daily., Disp: , Rfl:    Semaglutide,0.25 or 0.5MG /DOS, (OZEMPIC, 0.25 OR 0.5 MG/DOSE,) 2 MG/3ML SOPN, INJECT 0.5 MG INTO THE SKIN ONCE  WEEKLY, Disp: 3 mL, Rfl: 2   sucralfate (CARAFATE) 1 g tablet, Take 1 tablet (1 g total) by mouth 4 (four) times daily -  with meals and at bedtime., Disp: 120 tablet, Rfl: 1   SUMAtriptan (IMITREX) 100 MG tablet, Take 1 tablet at earliest onset of migraine.  May repeat in 2 hours if headache persists or  recurs.  Maximum 2 tablets in 24 hours., Disp: 10 tablet, Rfl: 5   traZODone (DESYREL) 150 MG tablet, Take 1 tablet (150 mg total) by mouth at bedtime as needed for sleep., Disp: 90 tablet, Rfl: 3   valACYclovir (VALTREX) 500 MG tablet, Take 500 mg by mouth 2 (two) times daily as needed (outbreaks)., Disp: , Rfl:    zolpidem (AMBIEN) 10 MG tablet, Take 10 mg by mouth at bedtime., Disp: , Rfl:    Allergies: Allergies  Allergen Reactions   Sulfa Antibiotics Other (See Comments) and Hives    Burning sensation, flushing to skin  Other Reaction(s): Other (See Comments)   Topamax [Topiramate] Other (See Comments)    Developed kidney stones   Fluorouracil Swelling    Burning skin    REVIEW OF SYSTEMS:   Review of Systems  Constitutional:  Negative for chills, fatigue and fever.  HENT:   Negative for lump/mass, mouth sores, nosebleeds, sore throat and trouble swallowing.   Eyes:  Negative for eye problems.  Respiratory:  Negative for cough and shortness of breath.   Cardiovascular:  Negative for chest pain, leg swelling and palpitations.  Gastrointestinal:  Positive for constipation and nausea. Negative for abdominal pain, diarrhea and vomiting.  Genitourinary:  Negative for bladder incontinence, difficulty urinating, dysuria, frequency, hematuria and nocturia.   Musculoskeletal:  Negative for arthralgias, back pain, flank pain, myalgias and neck pain.  Skin:  Negative for itching and rash.  Neurological:  Positive for dizziness and headaches. Negative for numbness.  Hematological:  Does not bruise/bleed easily.  Psychiatric/Behavioral:  Positive for sleep disturbance. Negative for depression and suicidal ideas. The patient is nervous/anxious.   All other systems reviewed and are negative.    VITALS:   Blood pressure 110/80, pulse 63, temperature 98 F (36.7 C), resp. rate 16, weight 154 lb (69.9 kg), last menstrual period 10/26/2014, SpO2 94%.  Wt Readings from Last 3 Encounters:   09/26/23 154 lb (69.9 kg)  08/10/23 150 lb (68 kg)  05/26/23 151 lb 0.2 oz (68.5 kg)    Body mass index is 27.28 kg/m.  Performance status (ECOG): 1 - Symptomatic but completely ambulatory  PHYSICAL EXAM:   Physical Exam Vitals and nursing note reviewed. Exam conducted with a chaperone present.  Constitutional:      Appearance: Normal appearance.  Cardiovascular:     Rate and Rhythm: Normal rate and regular rhythm.     Pulses: Normal pulses.     Heart sounds: Normal heart sounds.  Pulmonary:     Effort: Pulmonary effort is normal.     Breath sounds: Normal breath sounds.  Abdominal:     Palpations: Abdomen is soft. There is no hepatomegaly, splenomegaly or mass.     Tenderness: There is no abdominal tenderness.  Musculoskeletal:     Right lower leg: No edema.     Left lower leg: No edema.  Lymphadenopathy:     Cervical: No cervical adenopathy.     Right cervical: No superficial, deep or posterior cervical adenopathy.    Left cervical: No superficial, deep or posterior cervical adenopathy.     Upper Body:  Right upper body: No supraclavicular or axillary adenopathy.     Left upper body: No supraclavicular or axillary adenopathy.  Neurological:     General: No focal deficit present.     Mental Status: She is alert and oriented to person, place, and time.  Psychiatric:        Mood and Affect: Mood normal.        Behavior: Behavior normal.     LABS:      Latest Ref Rng & Units 08/10/2023   10:42 PM 05/17/2023    2:58 PM 08/18/2022   11:31 AM  CBC  WBC 4.0 - 10.5 K/uL 6.0  6.5  6.0   Hemoglobin 12.0 - 15.0 g/dL 47.8  29.5  62.1   Hematocrit 36.0 - 46.0 % 33.5  39.1  38.0   Platelets 150 - 400 K/uL 205  250  313       Latest Ref Rng & Units 08/10/2023   10:42 PM 05/17/2023    2:58 PM 02/24/2023    1:34 PM  CMP  Glucose 70 - 99 mg/dL 98  308  89   BUN 6 - 20 mg/dL 14  13  11    Creatinine 0.44 - 1.00 mg/dL 6.57  8.46  9.62   Sodium 135 - 145 mmol/L 139  136   142   Potassium 3.5 - 5.1 mmol/L 3.8  3.4  4.2   Chloride 98 - 111 mmol/L 109  103  104   CO2 22 - 32 mmol/L 25  22  20    Calcium 8.9 - 10.3 mg/dL 8.6  9.1  9.5   Total Protein 6.5 - 8.1 g/dL  7.3  6.8   Total Bilirubin 0.3 - 1.2 mg/dL  0.4  0.7   Alkaline Phos 38 - 126 U/L  72  87   AST 15 - 41 U/L  26  28   ALT 0 - 44 U/L  25  27      No results found for: "CEA1", "CEA" / No results found for: "CEA1", "CEA" No results found for: "PSA1" No results found for: "XBM841" No results found for: "CAN125"  No results found for: "TOTALPROTELP", "ALBUMINELP", "A1GS", "A2GS", "BETS", "BETA2SER", "GAMS", "MSPIKE", "SPEI" Lab Results  Component Value Date   FERRITIN 52 06/09/2016   Lab Results  Component Value Date   LDH 165 05/17/2023   LDH 131 08/18/2022   LDH 192 01/25/2022     STUDIES:   No results found.

## 2023-09-26 NOTE — Patient Instructions (Addendum)
[facility] at [facility] Discharge Instructions   You were seen and examined today by Dr. Ellin Saba.  On exam, he did not feel any lymph nodes or see anything when he looked inside at your throat.   Keep your follow-up with your doctors at the sarcoid clinic as well as Dr. Adriana Simas.  You no longer need to follow up in our clinic.   Call us should you need anything in the future.    Thank you for choosing [facility] at [facility] to provide your oncology and hematology care.  To afford each patient quality time with our provider, please arrive at least 15 minutes before your scheduled appointment time.   If you have a lab appointment with the Cancer Center please come in thru the Main Entrance and check in at the main information desk.  You need to re-schedule your appointment should you arrive 10 or more minutes late.  We strive to give you quality time with our providers, and arriving late affects you and other patients whose appointments are after yours.  Also, if you no show three or more times for appointments you may be dismissed from the clinic at the providers discretion.     Again, thank you for choosing [facility].  Our hope is that these requests will decrease the amount of time that you wait before being seen by our physicians.       _____________________________________________________________  Should you have questions after your visit to [facility], please contact our office at [phone] and follow the prompts.  Our office hours are 8:00 a.m. and 4:30 p.m. Monday - Friday.  Please note that voicemails left after 4:00 p.m. may not be returned until the following business day.  We are closed weekends and major holidays.  You do have access to a nurse 24-7, just call the main number to the clinic [phone] and do not press any options, hold on the line and a nurse will answer the phone.    For  prescription refill requests, have your pharmacy contact our office and allow 72 hours.    Due to Covid, you will need to wear a mask upon entering the hospital. If you do not have a mask, a mask will be given to you at the Main Entrance upon arrival. For doctor visits, patients may have 1 support person age 35 or older with them. For treatment visits, patients can not have anyone with them due to social distancing guidelines and our immunocompromised population.

## 2023-09-27 ENCOUNTER — Telehealth: Payer: Self-pay | Admitting: Pharmacy Technician

## 2023-09-27 ENCOUNTER — Other Ambulatory Visit (HOSPITAL_COMMUNITY): Payer: Self-pay

## 2023-09-27 NOTE — Telephone Encounter (Signed)
LMOVM for patient to call us back or send mychart message with the information.

## 2023-09-27 NOTE — Telephone Encounter (Signed)
Pharmacy Patient Advocate Encounter   Received notification from CoverMyMeds that prior authorization for Emgality 120MG /ML auto-injectors is due for renewal.   Insurance verification completed.   The patient is insured through [facility] .   Per test claim: PA required; PA started via CoverMyMeds. KEY [id number] . Please see clinical question(s) below that I am not finding the answer to in her chart and advise.  Has the patient demonstrated significant decrease in the number, frequency, and/or intensity of headaches?* I see that she's been having some other things going on, but I don't see that there's been a follow up about her headaches since starting Emgality.

## 2023-09-29 DIAGNOSIS — F332 Major depressive disorder, recurrent severe without psychotic features: Secondary | ICD-10-CM | POA: Diagnosis not present

## 2023-09-29 DIAGNOSIS — F411 Generalized anxiety disorder: Secondary | ICD-10-CM | POA: Diagnosis not present

## 2023-09-29 DIAGNOSIS — F410 Panic disorder [episodic paroxysmal anxiety] without agoraphobia: Secondary | ICD-10-CM | POA: Diagnosis not present

## 2023-10-02 ENCOUNTER — Ambulatory Visit
Admission: RE | Admit: 2023-10-02 | Discharge: 2023-10-02 | Disposition: A | Discharge status: Home / Self Care | Payer: Medicaid Other | Source: Ambulatory Visit | Attending: Neurology | Admitting: Neurology

## 2023-10-02 DIAGNOSIS — R519 Headache, unspecified: Secondary | ICD-10-CM | POA: Diagnosis not present

## 2023-10-02 DIAGNOSIS — D869 Sarcoidosis, unspecified: Secondary | ICD-10-CM

## 2023-10-02 DIAGNOSIS — G43709 Chronic migraine without aura, not intractable, without status migrainosus: Secondary | ICD-10-CM

## 2023-10-02 MED ORDER — GADOPICLENOL 0.5 MMOL/ML IV SOLN
6.0000 mL | Freq: Once | INTRAVENOUS | Status: AC | PRN
  Administered 2023-10-02: 6 mL via INTRAVENOUS

## 2023-10-12 ENCOUNTER — Ambulatory Visit: Payer: Medicaid Other | Admitting: Family Medicine

## 2023-10-12 VITALS — BP 95/69 | HR 85 | Temp 97.3°F | Ht 63.0 in | Wt 151.0 lb

## 2023-10-12 DIAGNOSIS — E785 Hyperlipidemia, unspecified: Secondary | ICD-10-CM | POA: Diagnosis not present

## 2023-10-12 DIAGNOSIS — I10 Essential (primary) hypertension: Secondary | ICD-10-CM | POA: Diagnosis not present

## 2023-10-12 DIAGNOSIS — E1169 Type 2 diabetes mellitus with other specified complication: Secondary | ICD-10-CM

## 2023-10-12 DIAGNOSIS — Z7985 Long-term (current) use of injectable non-insulin antidiabetic drugs: Secondary | ICD-10-CM | POA: Diagnosis not present

## 2023-10-12 DIAGNOSIS — R9431 Abnormal electrocardiogram [ECG] [EKG]: Secondary | ICD-10-CM

## 2023-10-12 DIAGNOSIS — E119 Type 2 diabetes mellitus without complications: Secondary | ICD-10-CM

## 2023-10-12 NOTE — Assessment & Plan Note (Signed)
BP on the low side here today. Stopping hydrochlorothiazide.

## 2023-10-12 NOTE — Progress Notes (Signed)
Subjective:  Patient ID: Courtney Hale, female    DOB: April 29, 1966  Age: 57 y.o. MRN: [id number]  CC:   Chief Complaint  Patient presents with   Abnormal ECG    Per [facility] pulmonologist prolonged QT interval , dizziness and memory issues for 7 weeks every day all the time - EKG done due to chest pain     HPI:  57 year old female with Sarcoidosis, DM-2, Insomnia, Anxiety and Depression, ongoing GI issues presents for follow up.  Recently found to have prolonged QT (EKG from Pulmonology). Will discuss today.  BP soft here today. She is on hydrochlorothiazide and losartan.   Last A1c was 4.9. Likely can discontinue Ozempic, especially in the setting of ongoing GI issues.  Patient reports recent dizziness. Describes episodes of room spinning.  Patient Active Problem List   Diagnosis Date Noted   Prolonged Q-T interval on ECG 10/12/2023   Exocrine pancreatic insufficiency 03/26/2023   Pancreatic lesion 10/14/2022   Hepatic steatosis 10/14/2022   Biliary dyskinesia 10/14/2022   DM (diabetes mellitus), type 2 (HCC) 07/15/2022   Insomnia 07/15/2022   Encounter for pain management 03/03/2022   High risk medication use 01/06/2022   Immunosuppression (HCC) 11/16/2021   Sarcoidosis 08/14/2021   Gastroesophageal reflux disease without esophagitis 07/27/2017   Anxiety and depression 12/18/2016   IBS (irritable bowel syndrome) 03/18/2016   Melanoma of skin (HCC) 03/18/2016   Headache, chronic migraine without aura 01/31/2013   Hypertension 01/31/2013    Social Hx   Social History   Socioeconomic History   Marital status: Single    Spouse name: Not on file   Number of children: Not on file   Years of education: Not on file   Highest education level: Not on file  Occupational History   Not on file  Tobacco Use   Smoking status: Never    Passive exposure: Past   Smokeless tobacco: Never  Vaping Use   Vaping status: Never Used  Substance and Sexual Activity   Alcohol use:  Not Currently   Drug use: No   Sexual activity: Yes  Other Topics Concern   Not on file  Social History Narrative   Not on file   Social Drivers of Health   Financial Resource Strain: Low Risk  (06/30/2021)   Overall Financial Resource Strain (CARDIA)    Difficulty of Paying Living Expenses: Not hard at all  Food Insecurity: No Food Insecurity (12/29/2022)   Received [facility], [facility]   Hunger Vital Sign    Worried About Running Out of Food in the Last Year: Never true    Ran Out of Food in the Last Year: Never true  Transportation Needs: No Transportation Needs (12/29/2022)   Received from [facility], [facility]   [facility] - Transportation    In the past 12 months, has lack of transportation kept you from medical appointments or from getting medications?: No    Lack of Transportation (Non-Medical): No  Physical Activity: Inactive (06/30/2021)   Exercise Vital Sign    Days of Exercise per Week: 0 days    Minutes of Exercise per Session: 0 min  Stress: Stress Concern Present (06/30/2021)   [employer] [facility] [employer] - Occupational Stress Questionnaire    Feeling of Stress : Very much  Social Connections: Socially Isolated (06/30/2021)   Social Connection and Isolation Panel [NHANES]    Frequency of Communication with Friends and Family:  More than three times a week    Frequency of Social Gatherings with Friends and Family: More than three times a week    Attends Religious Services: Never    Database administrator or Organizations: No    Attends Banker Meetings: Never    Marital Status: Never married    Review of Systems Per HPI  Objective:  BP 95/69   Pulse 85   Temp (!) 97.3 F (36.3 C)   Ht 5\' 3"  (1.6 m)   Wt 151 lb (68.5 kg)   LMP 10/26/2014   SpO2 96%   BMI 26.75 kg/m      10/12/2023   10:10 AM 09/26/2023    2:47 PM 08/11/2023    2:38 AM  BP/Weight   Systolic BP 95 110 115  Diastolic BP 69 80 87  Wt. (Lbs) 151 154   BMI 26.75 kg/m2 27.28 kg/m2     Physical Exam Vitals and nursing note reviewed.  Constitutional:      General: She is not in acute distress.    Appearance: Normal appearance.  HENT:     Head: Normocephalic and atraumatic.  Cardiovascular:     Rate and Rhythm: Normal rate and regular rhythm.  Pulmonary:     Effort: Pulmonary effort is normal.     Breath sounds: Normal breath sounds.  Neurological:     Mental Status: She is alert.  Psychiatric:     Comments: Flat affect.     Lab Results  Component Value Date   WBC 6.0 08/10/2023   HGB 11.5 (L) 08/10/2023   HCT 33.5 (L) 08/10/2023   PLT 205 08/10/2023   GLUCOSE 98 08/10/2023   CHOL 243 (H) 02/24/2023   TRIG 142 02/24/2023   HDL 44 02/24/2023   LDLCALC 173 (H) 02/24/2023   ALT 25 05/17/2023   AST 26 05/17/2023   NA 139 08/10/2023   K 3.8 08/10/2023   CL 109 08/10/2023   CREATININE 0.68 08/10/2023   BUN 14 08/10/2023   CO2 25 08/10/2023   TSH 0.796 02/24/2023   INR 0.9 07/21/2021   HGBA1C 4.9 03/01/2023   MICROALBUR 10mg /L 07/29/2022     Assessment & Plan:   Problem List Items Addressed This Visit       Cardiovascular and Mediastinum   Hypertension - Primary   BP on the low side here today. Stopping hydrochlorothiazide.        Endocrine   DM (diabetes mellitus), type 2 (HCC)   A1C today to assess. If remains low, will stop Ozempic.      Relevant Orders   Hemoglobin A1c   Microalbumin/Creatinine Ratio, Urine     Other   Prolonged Q-T interval on ECG   Patient on 2 medications that are know causes for prolonged QT (Paxil and Trazodone).  Recommended tapering down and possible discontinuation.  These are currently managed by Psychiatry, Dr. Tomasa Hose. Need to reach out to him to discuss.      Other Visit Diagnoses       Hyperlipidemia, unspecified hyperlipidemia type       Relevant Orders   Lipid panel       Follow-up:   Return in about 3 months (around 01/10/2024).  Everlene Other DO [facility]

## 2023-10-12 NOTE — Patient Instructions (Addendum)
Labs today.  Stop hydrochlorothiazide.  I will reach out to psychiatry.  Follow up in 3 months.

## 2023-10-12 NOTE — Assessment & Plan Note (Addendum)
Patient on 2 medications that are know causes for prolonged QT (Paxil and Trazodone).  Recommended tapering down and possible discontinuation.  These are currently managed by Psychiatry, Dr. Tomasa Hose. Need to reach out to him to discuss.

## 2023-10-12 NOTE — Assessment & Plan Note (Signed)
A1C today to assess. If remains low, will stop Ozempic.

## 2023-10-13 ENCOUNTER — Other Ambulatory Visit: Payer: Self-pay | Admitting: Family Medicine

## 2023-10-13 LAB — MICROALBUMIN / CREATININE URINE RATIO
Creatinine, Urine: 207.6 mg/dL
Microalb/Creat Ratio: 4 mg/g{creat} (ref 0–29)
Microalbumin, Urine: 8.4 ug/mL

## 2023-10-13 LAB — LIPID PANEL
Chol/HDL Ratio: 4.2 {ratio} (ref 0.0–4.4)
Cholesterol, Total: 237 mg/dL — ABNORMAL HIGH (ref 100–199)
HDL: 56 mg/dL (ref >39)
LDL Chol Calc (NIH): 161 mg/dL — ABNORMAL HIGH (ref 0–99)
Triglycerides: 110 mg/dL (ref 0–149)
VLDL Cholesterol Cal: 20 mg/dL (ref 5–40)

## 2023-10-13 LAB — HEMOGLOBIN A1C
Est. average glucose Bld gHb Est-mCnc: 103 mg/dL
Hgb A1c MFr Bld: 5.2 % (ref 4.8–5.6)

## 2023-10-13 MED ORDER — ROSUVASTATIN CALCIUM 10 MG PO TABS: 10.0000 mg | ORAL_TABLET | Freq: Every day | ORAL | 3 refills | Status: DC

## 2023-10-17 ENCOUNTER — Other Ambulatory Visit (HOSPITAL_BASED_OUTPATIENT_CLINIC_OR_DEPARTMENT_OTHER): Payer: Self-pay | Admitting: Pulmonary Disease

## 2023-10-24 ENCOUNTER — Other Ambulatory Visit: Payer: Self-pay | Admitting: Family Medicine

## 2023-10-24 ENCOUNTER — Other Ambulatory Visit: Payer: Self-pay | Admitting: Nurse Practitioner

## 2023-10-24 ENCOUNTER — Other Ambulatory Visit (HOSPITAL_BASED_OUTPATIENT_CLINIC_OR_DEPARTMENT_OTHER): Payer: Self-pay | Admitting: Pulmonary Disease

## 2023-10-24 DIAGNOSIS — E119 Type 2 diabetes mellitus without complications: Secondary | ICD-10-CM

## 2023-10-24 DIAGNOSIS — I10 Essential (primary) hypertension: Secondary | ICD-10-CM

## 2023-10-25 DIAGNOSIS — F411 Generalized anxiety disorder: Secondary | ICD-10-CM | POA: Diagnosis not present

## 2023-10-25 DIAGNOSIS — F410 Panic disorder [episodic paroxysmal anxiety] without agoraphobia: Secondary | ICD-10-CM | POA: Diagnosis not present

## 2023-10-25 DIAGNOSIS — F332 Major depressive disorder, recurrent severe without psychotic features: Secondary | ICD-10-CM | POA: Diagnosis not present

## 2023-11-04 ENCOUNTER — Ambulatory Visit: Payer: Medicaid Other | Admitting: Nurse Practitioner

## 2023-11-04 ENCOUNTER — Encounter: Payer: Self-pay | Admitting: Nurse Practitioner

## 2023-11-04 VITALS — BP 95/66 | HR 80 | Ht 63.0 in | Wt 156.0 lb

## 2023-11-04 DIAGNOSIS — E119 Type 2 diabetes mellitus without complications: Secondary | ICD-10-CM

## 2023-11-04 DIAGNOSIS — E782 Mixed hyperlipidemia: Secondary | ICD-10-CM | POA: Diagnosis not present

## 2023-11-04 DIAGNOSIS — Z7985 Long-term (current) use of injectable non-insulin antidiabetic drugs: Secondary | ICD-10-CM

## 2023-11-04 DIAGNOSIS — I10 Essential (primary) hypertension: Secondary | ICD-10-CM | POA: Diagnosis not present

## 2023-11-04 DIAGNOSIS — Z794 Long term (current) use of insulin: Secondary | ICD-10-CM

## 2023-11-04 MED ORDER — OZEMPIC (0.25 OR 0.5 MG/DOSE) 2 MG/3ML ~~LOC~~ SOPN: 0.5000 mg | PEN_INJECTOR | SUBCUTANEOUS | 3 refills | Status: DC

## 2023-11-04 NOTE — Progress Notes (Signed)
 Endocrinology Follow Up Note       11/04/2023, 8:52 AM   Subjective:    Patient ID: Courtney Hale, female    DOB: November 19, 1965.  Courtney Hale is being seen in follow up after being seen in consultation for management of currently uncontrolled symptomatic diabetes requested by  Cook, Jayce G, DO.   Past Medical History:  Diagnosis Date   Cancer [facility])    Skin   Complication of anesthesia    woke up during endoscopy and colonoscopy   Depression    Diabetes (HCC)    Fibromyalgia    GERD (gastroesophageal reflux disease)    Hemorrhoids    History of esophageal stricture    2011  peptic w/  dilatation   History of gastritis    2011   History of melanoma excision    2015  left leg/   12-09-2015 back    Hypertension    Microhematuria    Migraines    Nephrolithiasis    left side non-obstructive    Right ureteral stone    Sarcoidosis 07/24/2021   Urgency of urination    Wears contact lenses     Past Surgical History:  Procedure Laterality Date   ABDOMINAL HYSTERECTOMY     BALLOON DILATION N/A 05/03/2022   Procedure: BALLOON DILATION;  Surgeon: Cindie Carlin POUR, DO;  [facility];  Service: Endoscopy;  Laterality: N/A;   BIOPSY  11/16/2021   Procedure: BIOPSY;  Surgeon: Cindie Carlin POUR, DO;  [facility];  Service: Endoscopy;;   BIOPSY  05/03/2022   Procedure: BIOPSY;  Surgeon: Cindie Carlin POUR, DO;  [facility];  Service: Endoscopy;;   CARPAL TUNNEL RELEASE Right 10/29/2013   COLONOSCOPY N/A 08/27/2015   Procedure: COLONOSCOPY;  Surgeon: Margo LITTIE Haddock, MD;  [facility];  Service: Endoscopy;  Laterality: N/A;  0830   COLONOSCOPY WITH PROPOFOL  N/A 11/16/2021   Surgeon: Cindie Carlin POUR, DO;  Nonbleeding internal hemorrhoids, otherwise normal exam.  Recommended 10-year screening colonoscopy.   CYST EXCISION  10/25/2010   right hand   CYSTOSCOPY W/  URETERAL STENT PLACEMENT Right 12/22/2015   Procedure: CYSTOSCOPY WITH RETROGRADE PYELOGRAM/URETERAL STENT PLACEMENT;  Surgeon: Redell Lynwood Napoleon, MD;  Location: [facility];  Service: Urology;  Laterality: Right;   CYSTOSCOPY WITH RETROGRADE PYELOGRAM, URETEROSCOPY AND STENT PLACEMENT Right 01/05/2016   Procedure: CYSTOSCOPY WITH RETROGRADE PYELOGRAM, URETEROSCOPY AND STENT REPLACEMENT;  Surgeon: Redell Lynwood Napoleon, MD;  Location[facility];  Service: Urology;  Laterality: Right;   DILATION AND CURETTAGE OF UTERUS  03/12/2009   w/  Suction   double balloon enteroscopy     Dr. Myriam at [facility]: no erosions, no evidence of Crohn's disease, path without Crohn's.    EGD with push enteroscopy  02/12/2010    patent distal peptic stricture with diffuse antral erythema, normal D1 and D2    ESOPHAGOGASTRODUODENOSCOPY (EGD) WITH PROPOFOL  N/A 11/16/2021   Surgeon: Cindie Carlin POUR, DO;   Gastritis biopsied, otherwise normal exam.  Pathology with mild chronic gastritis, negative for H. pylori.   ESOPHAGOGASTRODUODENOSCOPY (EGD) WITH PROPOFOL  N/A 05/03/2022   Surgeon: Cindie Carlin POUR, DO;  Normal  esophagus s/p empiric dilation, gastritis biopsied.  Pathology with mild chronic, focally active gastritis, negative for H. pylori.   HAND SURGERY     LAPAROSCOPIC ASSISTED VAGINAL HYSTERECTOMY  10/26/2014   w/  Bilateral Salpingoophorectomy   STONE EXTRACTION WITH BASKET Right 01/05/2016   Procedure: STONE EXTRACTION WITH BASKET;  Surgeon: Redell Lynwood Napoleon, MD;  Location: [facility];  Service: Urology;  Laterality: Right;   TONSILLECTOMY  1998  approx    Social History   Socioeconomic History   Marital status: Single    Spouse name: Not on file   Number of children: Not on file   Years of education: Not on file   Highest education level: Not on file  Occupational History   Not on file  Tobacco Use   Smoking status: Never    Passive exposure: Past   Smokeless  tobacco: Never  Vaping Use   Vaping status: Never Used  Substance and Sexual Activity   Alcohol use: Not Currently   Drug use: No   Sexual activity: Yes  Other Topics Concern   Not on file  Social History Narrative   Not on file   Social Drivers of Health   Financial Resource Strain: Low Risk  (06/30/2021)   Overall Financial Resource Strain (CARDIA)    Difficulty of Paying Living Expenses: Not hard at all  Food Insecurity: No Food Insecurity (12/29/2022)   Received from [facility], [facility]   Hunger Vital Sign    Worried About Running Out of Food in the Last Year: Never true    Ran Out of Food in the Last Year: Never true  Transportation Needs: No Transportation Needs (12/29/2022)   Received from [facility], [facility]   [facility] - Transportation    In the past 12 months, has lack of transportation kept you from medical appointments or from getting medications?: No    Lack of Transportation (Non-Medical): No  Physical Activity: Inactive (06/30/2021)   Exercise Vital Sign    Days of Exercise per Week: 0 days    Minutes of Exercise per Session: 0 min  Stress: Stress Concern Present (06/30/2021)   [employer] - Occupational Stress Questionnaire    Feeling of Stress : Very much  Social Connections: Socially Isolated (06/30/2021)   Social Connection and Isolation Panel [NHANES]    Frequency of Communication with Friends and Family: More than three times a week    Frequency of Social Gatherings with Friends and Family: More than three times a week    Attends Religious Services: Never    Database Administrator or Organizations: No    Attends Engineer, Structural: Never    Marital Status: Never married    Family History  Problem Relation Age of Onset   Hypertension Mother    Thyroid  disease Mother    Diabetes Mother    Hyperlipidemia Mother    Stroke Mother    Heart  disease Mother    Hyperlipidemia Father    Hypertension Father    Heart attack Father    Heart failure Father    Dementia Maternal Aunt    Dementia Paternal Aunt    Colon cancer Neg Hx     Outpatient Encounter Medications as of 11/04/2023  Medication Sig   albuterol  (VENTOLIN  HFA) 108 (90 Base) MCG/ACT inhaler Inhale 2 puffs into the lungs every 6 (six) hours as needed for wheezing or shortness of  breath.   BD PEN NEEDLE NANO 2ND GEN 32G X 4 MM MISC USE AS DIRECTED   buPROPion (WELLBUTRIN XL) 300 MG 24 hr tablet Take 300 mg by mouth every morning.   busPIRone  (BUSPAR ) 5 MG tablet TAKE 1 TABLET BY MOUTH THREE TIMES DAILY   calcium  carbonate (TUMS - DOSED IN MG ELEMENTAL CALCIUM ) 500 MG chewable tablet Chew 1,500 mg by mouth 2 (two) times daily as needed for indigestion or heartburn.   Chromium 1000 MCG TABS Take 1,000 mcg by mouth daily.   clonazePAM  (KLONOPIN ) 0.5 MG tablet Take 0.5 mg by mouth daily as needed.   cyclobenzaprine  (FLEXERIL ) 10 MG tablet Take by mouth.   EPINEPHrine  0.3 mg/0.3 mL IJ SOAJ injection Inject 0.3 mg into the muscle as needed for anaphylaxis.   folic acid  (FOLVITE ) 1 MG tablet SMARTSIG:1 Tablet(s) By Mouth Morning-Night   Galcanezumab -gnlm (EMGALITY ) 120 MG/ML SOAJ Inject 120 mg into the skin every 28 (twenty-eight) days.   HUMIRA, 2 PEN, 40 MG/0.4ML PNKT INJECT 40MG  SUBCUTANEOUSLY EVERY 14 DAYS.   HYDROcodone -acetaminophen  (NORCO/VICODIN) 5-325 MG tablet Take 1-2 tablets by mouth 2 (two) times daily as needed for moderate pain or severe pain.   lansoprazole  (PREVACID ) 30 MG capsule TAKE 1 CAPSULE BY MOUTH TWICE DAILY BEFORE A MEAL   losartan  (COZAAR ) 100 MG tablet Take 1 tablet by mouth once daily   methotrexate  (RHEUMATREX) 2.5 MG tablet Take 8 tablets (20 mg total) by mouth once a week.   Pancrelipase , Lip-Prot-Amyl, (ZENPEP ) 40000-126000 units CPEP Take 2 capsules (80,000 Units total) by mouth 3 (three) times daily with meals. 1 capsule with snacks.    PARoxetine  (PAXIL ) 10 MG tablet Take 1 tablet by mouth once daily   PARoxetine  (PAXIL ) 40 MG tablet TAKE 1 TABLET BY MOUTH ONCE DAILY IN THE MORNING   polyethylene glycol powder (GLYCOLAX /MIRALAX ) 17 GM/SCOOP powder Take two capfuls daily in 12-16 ounces of water . Once bowel movements are soft, you can drop down to one capful daily.   pregabalin (LYRICA) 25 MG capsule Take by mouth.   Probiotic Product (PROBIOTIC PO) Take 1 capsule by mouth daily.   psyllium (REGULOID) 0.52 g capsule Take 1 capsule by mouth daily.   rosuvastatin  (CRESTOR ) 10 MG tablet Take 1 tablet (10 mg total) by mouth daily.   sucralfate  (CARAFATE ) 1 g tablet Take 1 tablet (1 g total) by mouth 4 (four) times daily -  with meals and at bedtime.   SUMAtriptan  (IMITREX ) 100 MG tablet Take 1 tablet at earliest onset of migraine.  May repeat in 2 hours if headache persists or recurs.  Maximum 2 tablets in 24 hours.   traZODone  (DESYREL ) 150 MG tablet Take 1 tablet (150 mg total) by mouth at bedtime as needed for sleep.   valACYclovir (VALTREX) 500 MG tablet Take 500 mg by mouth 2 (two) times daily as needed (outbreaks).   zolpidem  (AMBIEN ) 10 MG tablet Take 10 mg by mouth at bedtime.   [DISCONTINUED] OZEMPIC , 0.25 OR 0.5 MG/DOSE, 2 MG/3ML SOPN INJECT 0.5 MG INTO THE SKIN ONCE WEEKLY   Continuous Blood Gluc Sensor (FREESTYLE LIBRE 2 SENSOR) MISC Use sensor as directed. Change every 10-14 days. (Patient not taking: Reported on 11/04/2023)   Semaglutide ,0.25 or 0.5MG /DOS, (OZEMPIC , 0.25 OR 0.5 MG/DOSE,) 2 MG/3ML SOPN Inject 0.5 mg into the skin once a week.   No facility-administered encounter medications on file as of 11/04/2023.    ALLERGIES: Allergies  Allergen Reactions   Sulfa Antibiotics Other (See Comments) and Hives  Burning sensation, flushing to skin  Other Reaction(s): Other (See Comments)   Topamax  [Topiramate ] Other (See Comments)    Developed kidney stones   Fluorouracil Swelling    Burning skin     VACCINATION STATUS: Immunization History  Administered Date(s) Administered   Influenza, Seasonal, Injecte, Preservative Fre 09/20/2023   Influenza,inj,Quad PF,6+ Mos 09/09/2016, 08/29/2017, 09/27/2019, 10/30/2020, 08/14/2021   Influenza-Unspecified 08/22/2013, 07/22/2014, 08/08/2015   MMR 01/22/2011   PFIZER(Purple Top)SARS-COV-2 Vaccination 12/30/2019, 01/20/2020   Tdap 01/22/2011, 04/17/2014    Diabetes She presents for her follow-up diabetic visit. She has type 2 diabetes mellitus. Onset time: diagnosed at approx age of 67. Her disease course has been improving. There are no hypoglycemic associated symptoms. Associated symptoms include weight loss. There are no hypoglycemic complications. There are no diabetic complications. Risk factors for coronary artery disease include diabetes mellitus, dyslipidemia, family history, hypertension and obesity. Current diabetic treatments: Ozempic  only. She is compliant with treatment most of the time. Her weight is fluctuating minimally. She is following a generally healthy diet. When asked about meal planning, she reported none. She has not had a previous visit with a dietitian. She participates in exercise intermittently. Her home blood glucose trend is fluctuating minimally. Her overall blood glucose range is 90-110 mg/dl. (She presents today with her logs, no meter, showing at goal glycemic profile.  Her most recent A1c on 12/18 was 5.2%, increasing from previous A1c of 4.9%.  She notes some intermittent GI issues, but these all started before the initiation of Ozempic .  She feels this medication has really helped her stabilize her glucose overall.) An ACE inhibitor/angiotensin II receptor blocker is being taken. She does not see a podiatrist.Eye exam is current.  Hyperlipidemia This is a chronic problem. The current episode started more than 1 year ago. The problem is uncontrolled. Recent lipid tests were reviewed and are high. Exacerbating diseases  include diabetes and obesity. Factors aggravating her hyperlipidemia include thiazides and fatty foods. She is currently on no antihyperlipidemic treatment. Compliance problems include adherence to diet and adherence to exercise.  Risk factors for coronary artery disease include diabetes mellitus, dyslipidemia, family history, obesity and hypertension.  Hypertension This is a chronic problem. The current episode started more than 1 year ago. The problem has been resolved since onset. The problem is controlled. Agents associated with hypertension include steroids. Risk factors for coronary artery disease include diabetes mellitus, dyslipidemia, family history and obesity. Past treatments include diuretics and angiotensin blockers. The current treatment provides significant improvement. There are no compliance problems.      Review of systems  Constitutional: + Minimally fluctuating body weight, current Body mass index is 27.63 kg/m., no fatigue, no subjective hyperthermia, no subjective hypothermia Eyes: no blurry vision, no xerophthalmia ENT: no sore throat, no nodules palpated in throat, no dysphagia/odynophagia, no hoarseness Cardiovascular: no chest pain, no shortness of breath, no palpitations, no leg swelling Respiratory: no cough, no shortness of breath Gastrointestinal: no nausea/vomiting/diarrhea Musculoskeletal: generalized aches and pains- says r/t sarcoidosis (controlled) Skin: no rashes, no hyperemia Neurological: no tremors, no numbness, no tingling, no dizziness Psychiatric: no depression, no anxiety  Objective:     BP 95/66 (BP Location: Left Arm, Patient Position: Sitting, Cuff Size: Large)   Pulse 80   Ht 5' 3 (1.6 m)   Wt 156 lb (70.8 kg)   LMP 10/26/2014   BMI 27.63 kg/m   Wt Readings from Last 3 Encounters:  11/04/23 156 lb (70.8 kg)  10/12/23 151 lb (68.5 kg)  09/26/23 154 lb (69.9 kg)     BP Readings from Last 3 Encounters:  11/04/23 95/66  10/12/23 95/69   09/26/23 110/80      Physical Exam- Limited  Constitutional:  Body mass index is 27.63 kg/m. , not in acute distress, normal state of mind Eyes:  EOMI, no exophthalmos Musculoskeletal: no gross deformities, strength intact in all four extremities, no gross restriction of joint movements Skin:  no rashes, no hyperemia Neurological: no tremor with outstretched hands  Diabetic Foot Exam - Simple   No data filed     CMP ( most recent) CMP     Component Value Date/Time   NA 139 08/10/2023 2242   NA 142 02/24/2023 1334   K 3.8 08/10/2023 2242   CL 109 08/10/2023 2242   CO2 25 08/10/2023 2242   GLUCOSE 98 08/10/2023 2242   BUN 14 08/10/2023 2242   BUN 11 02/24/2023 1334   CREATININE 0.68 08/10/2023 2242   CREATININE 0.66 07/15/2022 1511   CALCIUM  8.6 (L) 08/10/2023 2242   PROT 7.3 05/17/2023 1458   PROT 6.8 02/24/2023 1334   ALBUMIN 4.0 05/17/2023 1458   ALBUMIN 4.3 02/24/2023 1334   AST 26 05/17/2023 1458   ALT 25 05/17/2023 1458   ALKPHOS 72 05/17/2023 1458   BILITOT 0.4 05/17/2023 1458   BILITOT 0.7 02/24/2023 1334   GFRNONAA >60 08/10/2023 2242   GFRAA 121 08/27/2020 1517     Diabetic Labs (most recent): Lab Results  Component Value Date   HGBA1C 5.2 10/12/2023   HGBA1C 4.9 03/01/2023   HGBA1C 5.8 (A) 11/29/2022   MICROALBUR 10mg /L 07/29/2022     Lipid Panel ( most recent) Lipid Panel     Component Value Date/Time   CHOL 237 (H) 10/12/2023 1053   TRIG 110 10/12/2023 1053   HDL 56 10/12/2023 1053   CHOLHDL 4.2 10/12/2023 1053   LDLCALC 161 (H) 10/12/2023 1053   LABVLDL 20 10/12/2023 1053      Lab Results  Component Value Date   TSH 0.796 02/24/2023   TSH 0.93 07/15/2022   TSH 0.996 08/27/2020   TSH 1.220 06/09/2016   TSH 0.785 08/13/2015   FREET4 1.36 02/24/2023           Assessment & Plan:   1) Controlled type 2 diabetes mellitus without complication  She presents today with her logs, no meter, showing at goal glycemic profile.   Her most recent A1c on 12/18 was 5.2%, increasing from previous A1c of 4.9%.  She notes some intermittent GI issues, but these all started before the initiation of Ozempic .  She feels this medication has really helped her stabilize her glucose overall.  - Courtney Hale has currently uncontrolled symptomatic type 2 DM since 58 years of age.   -Recent labs reviewed.  - I had a long discussion with her about the progressive nature of diabetes and the pathology behind its complications. -her diabetes is complicated by chronic steroid use due to sarcoidosis and she remains at a high risk for more acute and chronic complications which include CAD, CVA, CKD, retinopathy, and neuropathy. These are all discussed in detail with her.  The following Lifestyle Medicine recommendations according to American College of Lifestyle Medicine [facility]) were discussed and offered to patient and she agrees to start the journey:  A. Whole Foods, Plant-based plate comprising of fruits and vegetables, plant-based proteins, whole-grain carbohydrates was discussed in detail with the patient.   A list for source of those nutrients were  also provided to the patient.  Patient will use only water  or unsweetened tea for hydration. B.  The need to stay away from risky substances including alcohol, smoking; obtaining 7 to 9 hours of restorative sleep, at least 150 minutes of moderate intensity exercise weekly, the importance of healthy social connections,  and stress reduction techniques were discussed. C.  A full color page of  Calorie density of various food groups per pound showing examples of each food groups was provided to the patient.  - Nutritional counseling repeated at each appointment due to patients tendency to fall back in to old habits.  - The patient admits there is a room for improvement in their diet and drink choices. -  Suggestion is made for the patient to avoid simple carbohydrates from their diet including  Cakes, Sweet Desserts / Pastries, Ice Cream, Soda (diet and regular), Sweet Tea, Candies, Chips, Cookies, Sweet Pastries, Store Bought Juices, Alcohol in Excess of 1-2 drinks a day, Artificial Sweeteners, Coffee Creamer, and Sugar-free Products. This will help patient to have stable blood glucose profile and potentially avoid unintended weight gain.   - I encouraged the patient to switch to unprocessed or minimally processed complex starch and increased protein intake (animal or plant source), fruits, and vegetables.   - Patient is advised to stick to a routine mealtimes to eat 3 meals a day and avoid unnecessary snacks (to snack only to correct hypoglycemia).  - she sees Penny Crumpton, RDN, CDE for diabetes education.  - I have approached her with the following individualized plan to manage her diabetes and patient agrees:   -Given her control, she was given the option to stop the Ozempic  today.  She feels this medication has helped her maintain good control and wants to continue.  She did have stomach issues prior to the initiation of this medication and feels they are not related.  -She does not need to monitor glucose routinely, however, may find it helpful to check fasting glucose 1-2 times per week to keep an eye on things.  - She is allergic to sulfa meds.  - Specific targets for  A1c; LDL, HDL, and Triglycerides were discussed with the patient.  2) Blood Pressure /Hypertension:  her blood pressure is controlled to target.   she is advised to continue her current medications including Losartan  100 mg p.o. daily with breakfast and HCTZ 25 mg po daily.  3) Lipids/Hyperlipidemia:    Review of her recent lipid panel from 10/12/23 showed uncontrolled LDL at 161 (improved).  Her PCP recently started her on statin medication. She is advised to avoid fried foods and butter.  4)  Weight/Diet:  her Body mass index is 27.63 kg/m.  -  clearly complicating her diabetes care.   she is a  candidate for weight loss. I discussed with her the fact that loss of 5 - 10% of her  current body weight will have the most impact on her diabetes management.  Exercise, and detailed carbohydrates information provided  -  detailed on discharge instructions.  5) Vitamin D  deficiency Currently on OTC vitamin D3 supplement.  Recent vitamin D  level from 02/24/23 was 80.2.  She can stop her vitamin d  supplement for now.  6) Chronic Care/Health Maintenance: -she is on ACEI/ARB and not on Statin medications and is encouraged to initiate and continue to follow up with Ophthalmology, Dentist, Podiatrist at least yearly or according to recommendations, and advised to stay away from smoking. I have recommended yearly flu vaccine  and pneumonia vaccine at least every 5 years; moderate intensity exercise for up to 150 minutes weekly; and sleep for at least 7 hours a day.  - she is advised to maintain close follow up with Cook, Jayce G, DO for primary care needs, as well as her other providers for optimal and coordinated care.      I spent  48  minutes in the care of the patient today including review of labs from CMP, Lipids, Thyroid  Function, Hematology (current and previous including abstractions from other facilities); face-to-face time discussing  her blood glucose readings/logs, discussing hypoglycemia and hyperglycemia episodes and symptoms, medications doses, her options of short and long term treatment based on the latest standards of care / guidelines;  discussion about incorporating lifestyle medicine;  and documenting the encounter. Risk reduction counseling performed per USPSTF guidelines to reduce obesity and cardiovascular risk factors.     Please refer to Patient Instructions for Blood Glucose Monitoring and Insulin /Medications Dosing Guide  in media tab for additional information. Please  also refer to  Patient Self Inventory in the Media  tab for reviewed elements of pertinent patient  history.  Courtney Hale participated in the discussions, expressed understanding, and voiced agreement with the above plans.  All questions were answered to her satisfaction. she is encouraged to contact clinic should she have any questions or concerns prior to her return visit.     Follow up plan: - Return in about 6 months (around 05/03/2024) for Diabetes F/U with A1c in office, No previsit labs.  Benton Rio, [facility] [address] [place], [place] [address] Phone: [phone] Fax: [phone]  11/04/2023, 8:52 AM

## 2023-11-07 ENCOUNTER — Other Ambulatory Visit: Payer: Self-pay | Admitting: Family Medicine

## 2023-11-07 DIAGNOSIS — I10 Essential (primary) hypertension: Secondary | ICD-10-CM

## 2023-11-08 DIAGNOSIS — F332 Major depressive disorder, recurrent severe without psychotic features: Secondary | ICD-10-CM | POA: Diagnosis not present

## 2023-11-08 DIAGNOSIS — F411 Generalized anxiety disorder: Secondary | ICD-10-CM | POA: Diagnosis not present

## 2023-11-08 DIAGNOSIS — F410 Panic disorder [episodic paroxysmal anxiety] without agoraphobia: Secondary | ICD-10-CM | POA: Diagnosis not present

## 2023-11-11 DIAGNOSIS — F410 Panic disorder [episodic paroxysmal anxiety] without agoraphobia: Secondary | ICD-10-CM | POA: Diagnosis not present

## 2023-11-11 DIAGNOSIS — F411 Generalized anxiety disorder: Secondary | ICD-10-CM | POA: Diagnosis not present

## 2023-11-11 DIAGNOSIS — F332 Major depressive disorder, recurrent severe without psychotic features: Secondary | ICD-10-CM | POA: Diagnosis not present

## 2023-11-16 DIAGNOSIS — Z7960 Long term (current) use of unspecified immunomodulators and immunosuppressants: Secondary | ICD-10-CM | POA: Diagnosis not present

## 2023-11-16 DIAGNOSIS — D860 Sarcoidosis of lung: Secondary | ICD-10-CM | POA: Diagnosis not present

## 2023-11-21 DIAGNOSIS — A6004 Herpesviral vulvovaginitis: Secondary | ICD-10-CM | POA: Diagnosis not present

## 2023-11-21 DIAGNOSIS — Z6827 Body mass index (BMI) 27.0-27.9, adult: Secondary | ICD-10-CM | POA: Diagnosis not present

## 2023-11-21 DIAGNOSIS — Z0000 Encounter for general adult medical examination without abnormal findings: Secondary | ICD-10-CM | POA: Diagnosis not present

## 2023-11-23 ENCOUNTER — Other Ambulatory Visit: Payer: Self-pay | Admitting: Family Medicine

## 2023-11-23 ENCOUNTER — Telehealth: Payer: Self-pay

## 2023-11-23 ENCOUNTER — Other Ambulatory Visit: Payer: Self-pay

## 2023-11-23 DIAGNOSIS — I10 Essential (primary) hypertension: Secondary | ICD-10-CM

## 2023-11-23 DIAGNOSIS — F411 Generalized anxiety disorder: Secondary | ICD-10-CM | POA: Diagnosis not present

## 2023-11-23 DIAGNOSIS — F410 Panic disorder [episodic paroxysmal anxiety] without agoraphobia: Secondary | ICD-10-CM | POA: Diagnosis not present

## 2023-11-23 DIAGNOSIS — F332 Major depressive disorder, recurrent severe without psychotic features: Secondary | ICD-10-CM | POA: Diagnosis not present

## 2023-11-23 MED ORDER — LOSARTAN POTASSIUM 100 MG PO TABS: ORAL_TABLET | ORAL | 3 refills | Status: AC

## 2023-11-23 NOTE — Telephone Encounter (Signed)
Please advise.  Patient last seen 10/12/23  Patient needs to be contacted, due to she wants a second EKG reading and lab work done. She will come clinic as walk in for lab work as advised. Wants to be contacted about EKG as soon as possible.

## 2023-11-25 ENCOUNTER — Ambulatory Visit: Payer: Medicaid Other | Admitting: Nurse Practitioner

## 2023-11-25 ENCOUNTER — Encounter: Payer: Self-pay | Admitting: Physician Assistant

## 2023-11-25 ENCOUNTER — Ambulatory Visit: Payer: Medicaid Other | Admitting: Physician Assistant

## 2023-11-25 VITALS — BP 100/68 | HR 90 | Temp 97.3°F | Ht 63.0 in | Wt 152.0 lb

## 2023-11-25 DIAGNOSIS — R9431 Abnormal electrocardiogram [ECG] [EKG]: Secondary | ICD-10-CM | POA: Diagnosis not present

## 2023-11-25 NOTE — Progress Notes (Signed)
   Established Patient Office Visit  Subjective   Patient ID: Courtney Hale, female    DOB: 1966/07/19  Age: 58 y.o. MRN: [id number]  Chief Complaint  Patient presents with   abnormal ekg and recheck labs    HPI Patient presents today for follow up on QT prolongation on previous EKG. Patient reports overall feeling well. Since last visit she has stopped hydrochlorothiazide and Trazadone dose has been reduced from 150mg  to 75mg . Patient denies chest pain, shortness of breath or palpitations.    Review of Systems  Constitutional:  Negative for chills, fever and malaise/fatigue.  Eyes:  Negative for blurred vision and double vision.  Respiratory:  Negative for cough and shortness of breath.   Cardiovascular:  Negative for chest pain and palpitations.  Gastrointestinal:  Negative for heartburn and nausea.  Neurological:  Negative for headaches.      Objective:     BP 100/68   Pulse 90   Temp (!) 97.3 F (36.3 C)   Ht 5\' 3"  (1.6 m)   Wt 152 lb (68.9 kg)   LMP 10/26/2014   SpO2 96%   BMI 26.93 kg/m    Physical Exam Constitutional:      Appearance: Normal appearance.  HENT:     Head: Normocephalic.     Mouth/Throat:     Mouth: Mucous membranes are moist.     Pharynx: Oropharynx is clear.  Eyes:     Extraocular Movements: Extraocular movements intact.     Conjunctiva/sclera: Conjunctivae normal.  Cardiovascular:     Rate and Rhythm: Normal rate and regular rhythm.     Heart sounds: No murmur heard.    No gallop.  Pulmonary:     Effort: Pulmonary effort is normal.     Breath sounds: No wheezing, rhonchi or rales.  Musculoskeletal:     Right lower leg: No edema.     Left lower leg: No edema.  Skin:    General: Skin is warm and dry.  Neurological:     General: No focal deficit present.     Mental Status: She is alert and oriented to person, place, and time.  Psychiatric:        Mood and Affect: Mood normal.        Behavior: Behavior normal.      No  results found for any visits on 11/25/23.  The 10-year ASCVD risk score (Arnett DK, et al., 2019) is: 3.7%    Assessment & Plan:   Return if symptoms worsen or fail to improve.   Prolonged Q-T interval on ECG -     EKG 12-Lead -     CBC with Differential/Platelet -     CMP14+EGFR  Patient stable today. Physical exam without abnormal findings. Reviewed medication list today for other medication causes for QT prolongation. Follow up EKG performed today- HR 87, PR- 142, QT- 354, regular rhythm. CBC and CMP ordered to ensure stable blood count and electrolytes, rule out other causes QT prolongation. Plan to continue with follow up as previously discussed and continue current treatment regimen.   Courtney Amend Jaeanna Mccomber, PA-C

## 2023-11-26 LAB — CMP14+EGFR
ALT: 14 [IU]/L (ref 0–32)
AST: 18 [IU]/L (ref 0–40)
Albumin: 4.4 g/dL (ref 3.8–4.9)
Alkaline Phosphatase: 101 [IU]/L (ref 44–121)
BUN/Creatinine Ratio: 17 (ref 9–23)
BUN: 15 mg/dL (ref 6–24)
Bilirubin Total: 0.6 mg/dL (ref 0.0–1.2)
CO2: 24 mmol/L (ref 20–29)
Calcium: 9.2 mg/dL (ref 8.7–10.2)
Chloride: 101 mmol/L (ref 96–106)
Creatinine, Ser: 0.86 mg/dL (ref 0.57–1.00)
Globulin, Total: 2.1 g/dL (ref 1.5–4.5)
Glucose: 94 mg/dL (ref 70–99)
Potassium: 3.9 mmol/L (ref 3.5–5.2)
Sodium: 140 mmol/L (ref 134–144)
Total Protein: 6.5 g/dL (ref 6.0–8.5)
eGFR: 80 mL/min/{1.73_m2} (ref >59)

## 2023-11-26 LAB — CBC WITH DIFFERENTIAL/PLATELET
Basophils Absolute: 0.1 10*3/uL (ref 0.0–0.2)
Basos: 1 %
EOS (ABSOLUTE): 0.1 10*3/uL (ref 0.0–0.4)
Eos: 2 %
Hematocrit: 41.6 % (ref 34.0–46.6)
Hemoglobin: 14.2 g/dL (ref 11.1–15.9)
Immature Grans (Abs): 0 10*3/uL (ref 0.0–0.1)
Immature Granulocytes: 0 %
Lymphocytes Absolute: 2.6 10*3/uL (ref 0.7–3.1)
Lymphs: 41 %
MCH: 31.6 pg (ref 26.6–33.0)
MCHC: 34.1 g/dL (ref 31.5–35.7)
MCV: 92 fL (ref 79–97)
Monocytes Absolute: 0.5 10*3/uL (ref 0.1–0.9)
Monocytes: 7 %
Neutrophils Absolute: 3.2 10*3/uL (ref 1.4–7.0)
Neutrophils: 49 %
Platelets: 250 10*3/uL (ref 150–450)
RBC: 4.5 x10E6/uL (ref 3.77–5.28)
RDW: 13.2 % (ref 11.7–15.4)
WBC: 6.5 10*3/uL (ref 3.4–10.8)

## 2023-11-27 ENCOUNTER — Encounter: Payer: Self-pay | Admitting: Physician Assistant

## 2023-11-28 ENCOUNTER — Telehealth: Payer: Self-pay

## 2023-11-28 ENCOUNTER — Other Ambulatory Visit (HOSPITAL_COMMUNITY): Payer: Self-pay

## 2023-11-28 NOTE — Telephone Encounter (Signed)
*  [facility]  Pharmacy Patient Advocate Encounter  Received notification from [facility] that Prior Authorization for Emgality 120MG /ML auto-injectors (migraine)  has been APPROVED from 11/28/2023 to 11/27/2024. Ran test claim, Copay is $4.00. This test claim was processed through [facility]- copay amounts may vary at other pharmacies due to pharmacy/plan contracts, or as the patient moves through the different stages of their insurance plan.   PA #/Case ID/Reference #: [id number]  PA Case: [id number]

## 2023-11-28 NOTE — Telephone Encounter (Signed)
PA request has been Submitted. New Encounter created for follow up. For additional info see Pharmacy Prior Auth telephone encounter from 02/03.

## 2023-11-30 NOTE — Progress Notes (Signed)
 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 Limit use of pain relievers to no more than 2 days out of week to prevent risk of rebound or medication-overuse headache. Keep headache diary Follow up 6 months.   Subjective:  Courtney Hale is a 58 year old female with HTN, sarcoidosis, DM, and history of melanoma and kidney stone who follows up for headaches.  UPDATE: MRI of brain with and without contrast from 10/02/2023 personally reviewed revealed:  1. No acute or reversible finding. Normal appearance of the brain itself. 2. Continued subtle evidence of the 2 cm marrow space lesion in the left frontal lobe which no longer shows contrast enhancement. This is consistent either with a treated melanoma lesion or a sarcoid lesion of bone on the basis of the imaging. No new abnormal bone finding in the region. 3. Mucosal thickening of the maxillary sinuses.  Started Emgality . Improved Intensity:  Severe Duration:  minutes with sumatriptan  (however it makes her feels weird but manageable for now) Frequency:  once a month  Has not been on the Emgality  for 2 months due to insurance not approving it.  We just received approval 3 days ago.  Occipital headaches occur 5 or 6 times a month.  Neck pops.     Current NSAIDS/analgesics:  Vicodin (chronic pain) Current triptans: sumatriptan  100mg  Current ergotamine:  none Current anti-emetic:  Zofran  4mg  Current muscle relaxants:  Flexeril  10mg  Current Antihypertensive medications:  losartan , HCTZ Current  Antidepressant medications:  paroxetine  40mg  daily Current Anticonvulsant medications:  none Current anti-CGRP:  Emgality  Other therapy:  no Birth control:  no Other medications:  Humira, methotrexate , buspirone , trazodone    Caffeine:   Occasional iced coffee or diet soft drink Diet:  Not enough water .  Skips meals Exercise:  no - due to her medications (chemo and Humira), fatigued Depression:  yes; Anxiety:  yes.  She will be starting to see a therapist.   Other pain:  diffuse Sleep hygiene:  1-2 hours a night on trazodone .  Anxiety.  Brain doesn't shut off.    HISTORY: History of classic migraines with headaches involving various locations in the head.  She has sarcoidosis with multiple lesions to the bone.  About 5 months ago, she began experiencing an occipital headache described as a throbbing pain across the back of the head and involving the back of the neck.  Scalp tender to touch.  It is a persistent dull ache but fluctuates in intensity.  When severe, it is associated with blurred vision, photophobia and phonophobia.  She has nausea but she is always nauseous due to medications.  Lasts a couple of hours and occurs 2 to 3 times a week.  Any pressure on the back of head either by palpation or while laying supine, aggravates it.  Not aggravated by neck movement. She notes blurred vision in the left eye and plans to see her eye doctor.  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    MRI of brain with and without contrast on 07/08/2021 personally reviewed showed 2 cm lesion in left frontal bone but normal brain.  Past NSAIDS/analgesics:  naproxen , etodolac , meloxicam  Past abortive triptans:  rizatriptan -MLT 10mg  (made head feel funny) Past abortive ergotamine:  none Past muscle relaxants:  none Past anti-emetic:  Compazine  Past antihypertensive medications:  verapamil  Past antidepressant medications:  citalopram  Past anticonvulsant medications:   topiramate  (kidney stones), pregablin Past anti-CGRP:  none Past vitamins/Herbal/Supplements:  none Past antihistamines/decongestants:  Benadryl , meclizine  Other past therapies:  none   Family history of migraines:  no  PAST MEDICAL HISTORY: Past Medical History:  Diagnosis Date   Cancer (HCC)    Skin   Complication of anesthesia    woke up during endoscopy and colonoscopy   Depression    Diabetes (HCC)    Fibromyalgia    GERD (gastroesophageal reflux disease)    Hemorrhoids    History of esophageal stricture    2011  peptic w/  dilatation   History of gastritis    2011   History of melanoma excision    2015  left leg/   12-09-2015 back    Hypertension    Microhematuria    Migraines    Nephrolithiasis    left side non-obstructive    Right ureteral stone    Sarcoidosis 07/24/2021   Urgency of urination    Wears contact lenses     MEDICATIONS: Current Outpatient Medications on File Prior to Visit  Medication Sig Dispense Refill   albuterol  (VENTOLIN  HFA) 108 (90 Base) MCG/ACT inhaler Inhale 2 puffs into the lungs every 6 (six) hours as needed for wheezing or shortness of breath. 8 g 6   BD PEN NEEDLE NANO 2ND GEN 32G X 4 MM MISC USE AS DIRECTED 100 each 0   buPROPion (WELLBUTRIN XL) 300 MG 24 hr tablet Take 300 mg by mouth every morning.     busPIRone  (BUSPAR ) 5 MG tablet TAKE 1 TABLET BY MOUTH THREE TIMES DAILY 90 tablet 1   calcium  carbonate (TUMS - DOSED IN MG ELEMENTAL CALCIUM ) 500 MG chewable tablet Chew 1,500 mg by mouth 2 (two) times daily as needed for indigestion or heartburn.     Chromium 1000 MCG TABS Take 1,000 mcg by mouth daily.     clonazePAM  (KLONOPIN ) 0.5 MG tablet Take 0.5 mg by mouth daily as needed.     Continuous Blood Gluc Sensor (FREESTYLE LIBRE 2 SENSOR) MISC Use sensor as directed. Change every 10-14 days. (Patient not taking: Reported on 11/04/2023) 2 each 3   cyclobenzaprine  (FLEXERIL ) 10 MG tablet Take by mouth.     EPINEPHrine  0.3 mg/0.3 mL  IJ SOAJ injection Inject 0.3 mg into the muscle as needed for anaphylaxis. 1 each 0   folic acid  (FOLVITE ) 1 MG tablet SMARTSIG:1 Tablet(s) By Mouth Morning-Night     Galcanezumab -gnlm (EMGALITY ) 120 MG/ML SOAJ Inject 120 mg into the skin every 28 (twenty-eight) days. 1.12 mL 11   HUMIRA, 2 PEN, 40 MG/0.4ML PNKT INJECT 40MG  SUBCUTANEOUSLY EVERY 14 DAYS.     HYDROcodone -acetaminophen  (NORCO/VICODIN) 5-325 MG tablet Take 1-2 tablets by mouth 2 (two) times daily as needed for moderate pain or severe pain. 90 tablet 0   lansoprazole  (PREVACID ) 30 MG capsule TAKE 1 CAPSULE BY MOUTH TWICE DAILY BEFORE A MEAL 60 capsule 5   losartan  (COZAAR ) 100 MG tablet TAKE 1 TABLET BY MOUTH ONCE DAILY. 90 tablet 3   methotrexate  (RHEUMATREX) 2.5 MG tablet Take 8 tablets (20 mg total) by mouth once a week. 64 tablet 0   Pancrelipase , Lip-Prot-Amyl, (ZENPEP ) 40000-126000 units CPEP Take 2 capsules (80,000 Units total) by mouth 3 (  three) times daily with meals. 1 capsule with snacks. 240 capsule 3   PARoxetine  (PAXIL ) 10 MG tablet Take 1 tablet by mouth once daily 90 tablet 0   PARoxetine  (PAXIL ) 40 MG tablet TAKE 1 TABLET BY MOUTH ONCE DAILY IN THE MORNING 90 tablet 0   polyethylene glycol powder (GLYCOLAX /MIRALAX ) 17 GM/SCOOP powder Take two capfuls daily in 12-16 ounces of water . Once bowel movements are soft, you can drop down to one capful daily. 255 g 3   pregabalin (LYRICA) 25 MG capsule Take by mouth.     Probiotic Product (PROBIOTIC PO) Take 1 capsule by mouth daily.     psyllium (REGULOID) 0.52 g capsule Take 1 capsule by mouth daily.     rosuvastatin  (CRESTOR ) 10 MG tablet Take 1 tablet (10 mg total) by mouth daily. 90 tablet 3   Semaglutide ,0.25 or 0.5MG /DOS, (OZEMPIC , 0.25 OR 0.5 MG/DOSE,) 2 MG/3ML SOPN Inject 0.5 mg into the skin once a week. 9 mL 3   sucralfate  (CARAFATE ) 1 g tablet Take 1 tablet (1 g total) by mouth 4 (four) times daily -  with meals and at bedtime. 120 tablet 1   SUMAtriptan  (IMITREX )  100 MG tablet Take 1 tablet at earliest onset of migraine.  May repeat in 2 hours if headache persists or recurs.  Maximum 2 tablets in 24 hours. 10 tablet 5   traZODone  (DESYREL ) 150 MG tablet Take 1 tablet (150 mg total) by mouth at bedtime as needed for sleep. 90 tablet 3   valACYclovir (VALTREX) 500 MG tablet Take 500 mg by mouth 2 (two) times daily as needed (outbreaks).     zolpidem  (AMBIEN ) 10 MG tablet Take 10 mg by mouth at bedtime.     No current facility-administered medications on file prior to visit.    ALLERGIES: Allergies  Allergen Reactions   Sulfa Antibiotics Other (See Comments) and Hives    Burning sensation, flushing to skin  Other Reaction(s): Other (See Comments)   Topamax  [Topiramate ] Other (See Comments)    Developed kidney stones   Fluorouracil Swelling    Burning skin    FAMILY HISTORY: Family History  Problem Relation Age of Onset   Hypertension Mother    Thyroid  disease Mother    Diabetes Mother    Hyperlipidemia Mother    Stroke Mother    Heart disease Mother    Hyperlipidemia Father    Hypertension Father    Heart attack Father    Heart failure Father    Dementia Maternal Aunt    Dementia Paternal Aunt    Colon cancer Neg Hx       Objective:  Blood pressure 120/82, pulse 79, height 5' 3 (1.6 m), weight 157 lb (71.2 kg), last menstrual period 10/26/2014, SpO2 94%. General: No acute distress.  Patient appears well-groomed.     Juliene Dunnings, DO  CC: Jacqulyn Ahle, DO

## 2023-12-01 ENCOUNTER — Encounter: Payer: Self-pay | Admitting: Neurology

## 2023-12-01 ENCOUNTER — Ambulatory Visit: Payer: Medicaid Other | Admitting: Neurology

## 2023-12-01 VITALS — BP 120/82 | HR 79 | Ht 63.0 in | Wt 157.0 lb

## 2023-12-01 DIAGNOSIS — R519 Headache, unspecified: Secondary | ICD-10-CM

## 2023-12-01 DIAGNOSIS — G43009 Migraine without aura, not intractable, without status migrainosus: Secondary | ICD-10-CM

## 2023-12-01 DIAGNOSIS — M542 Cervicalgia: Secondary | ICD-10-CM

## 2023-12-01 NOTE — Patient Instructions (Signed)
 Check cervical spine X-ray Emgality  every 28 days Sumatriptan  as needed/directed Follow up 6 months.

## 2023-12-14 ENCOUNTER — Other Ambulatory Visit: Payer: Self-pay

## 2023-12-14 DIAGNOSIS — C7951 Secondary malignant neoplasm of bone: Secondary | ICD-10-CM

## 2023-12-15 ENCOUNTER — Inpatient Hospital Stay: Payer: Medicaid Other

## 2023-12-19 ENCOUNTER — Other Ambulatory Visit: Payer: Self-pay

## 2023-12-19 ENCOUNTER — Inpatient Hospital Stay: Payer: Medicaid Other | Attending: Hematology

## 2023-12-19 DIAGNOSIS — K760 Fatty (change of) liver, not elsewhere classified: Secondary | ICD-10-CM

## 2023-12-19 DIAGNOSIS — K8681 Exocrine pancreatic insufficiency: Secondary | ICD-10-CM

## 2023-12-19 DIAGNOSIS — K869 Disease of pancreas, unspecified: Secondary | ICD-10-CM

## 2023-12-19 MED ORDER — ZENPEP 40000-126000 UNITS PO CPEP: 2.0000 | ORAL_CAPSULE | Freq: Three times a day (TID) | ORAL | 3 refills | Status: DC

## 2023-12-20 DIAGNOSIS — F332 Major depressive disorder, recurrent severe without psychotic features: Secondary | ICD-10-CM | POA: Diagnosis not present

## 2023-12-20 DIAGNOSIS — F410 Panic disorder [episodic paroxysmal anxiety] without agoraphobia: Secondary | ICD-10-CM | POA: Diagnosis not present

## 2023-12-20 DIAGNOSIS — F411 Generalized anxiety disorder: Secondary | ICD-10-CM | POA: Diagnosis not present

## 2023-12-22 ENCOUNTER — Inpatient Hospital Stay: Payer: Medicaid Other | Admitting: Oncology

## 2024-01-03 MED ORDER — ZENPEP 60000-189600 UNITS PO CPEP: 60000.0000 [IU] | ORAL_CAPSULE | Freq: Three times a day (TID) | ORAL | 3 refills | Status: AC

## 2024-01-03 NOTE — Addendum Note (Signed)
 Addended by: Gelene Mink on: 01/03/2024 12:52 PM   Modules accepted: Orders

## 2024-01-03 NOTE — Progress Notes (Signed)
 Phoned and spoke with the pt and advised of the above. Pt states she is still dealing with that constipation and would love for you to send in a Rx for the 60,000 to [facility] in [place]. She thanks you

## 2024-01-03 NOTE — Progress Notes (Signed)
 Great! I have sent that in for her. I think she will notice a difference with this.

## 2024-01-03 NOTE — Progress Notes (Signed)
 Dena: I saw a refill had gone through for Zenpep 40,000 units (take 2 capsules with meals and 1 with snacks). I know she last saw Verlon Au, and it appears she was moreso constipated at that time.  If she is still dealing with that, we can actually do Zenpp 60k lipase units (taking with meals and 1 with snacks). This should be able to decrease the constipation and provide more of a balance while still getting weight-dosed therapy.   If she would like to try this, I will send it in.

## 2024-01-10 ENCOUNTER — Ambulatory Visit: Payer: Medicaid Other | Admitting: Family Medicine

## 2024-01-10 VITALS — BP 124/84 | HR 82 | Temp 98.4°F | Ht 63.0 in | Wt 159.8 lb

## 2024-01-10 DIAGNOSIS — E1169 Type 2 diabetes mellitus with other specified complication: Secondary | ICD-10-CM | POA: Diagnosis not present

## 2024-01-10 DIAGNOSIS — I10 Essential (primary) hypertension: Secondary | ICD-10-CM | POA: Diagnosis not present

## 2024-01-10 DIAGNOSIS — E785 Hyperlipidemia, unspecified: Secondary | ICD-10-CM

## 2024-01-10 DIAGNOSIS — Z23 Encounter for immunization: Secondary | ICD-10-CM

## 2024-01-10 DIAGNOSIS — F410 Panic disorder [episodic paroxysmal anxiety] without agoraphobia: Secondary | ICD-10-CM | POA: Diagnosis not present

## 2024-01-10 DIAGNOSIS — F411 Generalized anxiety disorder: Secondary | ICD-10-CM | POA: Diagnosis not present

## 2024-01-10 DIAGNOSIS — F419 Anxiety disorder, unspecified: Secondary | ICD-10-CM

## 2024-01-10 DIAGNOSIS — G4700 Insomnia, unspecified: Secondary | ICD-10-CM

## 2024-01-10 DIAGNOSIS — F32A Depression, unspecified: Secondary | ICD-10-CM | POA: Diagnosis not present

## 2024-01-10 DIAGNOSIS — E119 Type 2 diabetes mellitus without complications: Secondary | ICD-10-CM

## 2024-01-10 DIAGNOSIS — F332 Major depressive disorder, recurrent severe without psychotic features: Secondary | ICD-10-CM | POA: Diagnosis not present

## 2024-01-10 DIAGNOSIS — R9431 Abnormal electrocardiogram [ECG] [EKG]: Secondary | ICD-10-CM | POA: Diagnosis not present

## 2024-01-10 MED ORDER — TRAZODONE HCL 150 MG PO TABS: 75.0000 mg | ORAL_TABLET | Freq: Every evening | ORAL | Status: AC | PRN

## 2024-01-10 NOTE — Assessment & Plan Note (Signed)
 Following psychiatry.  Repeat EKG performed today to assess for prolonged QT and QTc was 382.

## 2024-01-10 NOTE — Assessment & Plan Note (Signed)
 Doing extremely well.  Last A1c 5.2.  Discontinuing Ozempic.

## 2024-01-10 NOTE — Progress Notes (Signed)
 Subjective:  Patient ID: Courtney Hale, female    DOB: Dec 14, 1965  Age: 58 y.o. MRN: [id number]  CC:  Follow up  HPI:  58 year old female with a complicated past medical history including sarcoidosis presents for follow-up.  Type 2 diabetes is well-controlled.  I have advised her that she should discontinue Ozempic given most recent A1c 5.2.  BP stable on losartan.  Patient follows with psychiatry regarding anxiety and depression as well as insomnia.  Has had recent prolonged QT which has now improved following decrease in trazodone.  Follows closely with rheumatology and pulmonology regarding sarcoidosis.  Stable.  Patient reports ongoing fatigue and diffuse pain.  Overall this is stable.  Patient Active Problem List   Diagnosis Date Noted   Hyperlipidemia 01/10/2024   Prolonged Q-T interval on ECG 10/12/2023   Exocrine pancreatic insufficiency 03/26/2023   Pancreatic lesion 10/14/2022   Hepatic steatosis 10/14/2022   Biliary dyskinesia 10/14/2022   DM (diabetes mellitus), type 2 (HCC) 07/15/2022   Insomnia 07/15/2022   High risk medication use 01/06/2022   Immunosuppression (HCC) 11/16/2021   Sarcoidosis 08/14/2021   Gastroesophageal reflux disease without esophagitis 07/27/2017   Anxiety and depression 12/18/2016   IBS (irritable bowel syndrome) 03/18/2016   Melanoma of skin (HCC) 03/18/2016   Headache, chronic migraine without aura 01/31/2013   Hypertension 01/31/2013    Social Hx   Social History   Socioeconomic History   Marital status: Single    Spouse name: Not on file   Number of children: Not on file   Years of education: Not on file   Highest education level: Not on file  Occupational History   Not on file  Tobacco Use   Smoking status: Never    Passive exposure: Past   Smokeless tobacco: Never  Vaping Use   Vaping status: Never Used  Substance and Sexual Activity   Alcohol use: Not Currently   Drug use: No   Sexual activity: Yes  Other  Topics Concern   Not on file  Social History Narrative   Not on file   Social Drivers of Health   Financial Resource Strain: Low Risk  (06/30/2021)   Overall Financial Resource Strain (CARDIA)    Difficulty of Paying Living Expenses: Not hard at all  Food Insecurity: No Food Insecurity (12/29/2022)   Received from [facility], [facility]   Hunger Vital Sign    Worried About Running Out of Food in the Last Year: Never true    Ran Out of Food in the Last Year: Never true  Transportation Needs: No Transportation Needs (12/29/2022)   Received from [facility], [facility]   PRAPARE - Transportation    In the past 12 months, has lack of transportation kept you from medical appointments or from getting medications?: No    Lack of Transportation (Non-Medical): No  Physical Activity: Sufficiently Active (11/21/2023)   Received from [facility]   Exercise Vital Sign    Days of Exercise per Week: 7 days    Minutes of Exercise per Session: 30 min  Stress: Stress Concern Present (11/21/2023)   Received from [facility] - Occupational Stress Questionnaire    Feeling of Stress : Rather much  Social Connections: Socially Isolated (06/30/2021)   Social Connection and Isolation Panel [NHANES]    Frequency of Communication with Friends and Family: More than three times a week  Frequency of Social Gatherings with Friends and Family: More than three times a week    Attends Religious Services: Never    Database administrator or Organizations: No    Attends Engineer, structural: Never    Marital Status: Never married    Review of Systems Per HPI  Objective:  BP 124/84   Pulse 82   Temp 98.4 F (36.9 C)   Ht 5\' 3"  (1.6 m)   Wt 159 lb 12.8 oz (72.5 kg)   LMP 10/26/2014   SpO2 95%   BMI 28.31 kg/m      01/10/2024   11:05 AM 12/01/2023   10:47 AM 12/01/2023   10:39  AM  BP/Weight  Systolic BP 124 120 123  Diastolic BP 84 82 94  Wt. (Lbs) 159.8  157  BMI 28.31 kg/m2  27.81 kg/m2    Physical Exam Vitals and nursing note reviewed.  Constitutional:      General: She is not in acute distress.    Appearance: Normal appearance.  HENT:     Head: Normocephalic and atraumatic.  Eyes:     General:        Right eye: No discharge.        Left eye: No discharge.     Conjunctiva/sclera: Conjunctivae normal.  Cardiovascular:     Rate and Rhythm: Normal rate and regular rhythm.  Pulmonary:     Effort: Pulmonary effort is normal.     Breath sounds: Normal breath sounds. No wheezing, rhonchi or rales.  Neurological:     Mental Status: She is alert.  Psychiatric:     Comments: Flat affect.  Depressed mood.     Lab Results  Component Value Date   WBC 6.5 11/25/2023   HGB 14.2 11/25/2023   HCT 41.6 11/25/2023   PLT 250 11/25/2023   GLUCOSE 94 11/25/2023   CHOL 237 (H) 10/12/2023   TRIG 110 10/12/2023   HDL 56 10/12/2023   LDLCALC 161 (H) 10/12/2023   ALT 14 11/25/2023   AST 18 11/25/2023   NA 140 11/25/2023   K 3.9 11/25/2023   CL 101 11/25/2023   CREATININE 0.86 11/25/2023   BUN 15 11/25/2023   CO2 24 11/25/2023   TSH 0.796 02/24/2023   INR 0.9 07/21/2021   HGBA1C 5.2 10/12/2023   MICROALBUR 10mg /L 07/29/2022     Assessment & Plan:  Prolonged Q-T interval on ECG -     EKG 12-Lead  Immunization due -     Pneumococcal conjugate vaccine 20-valent  Primary hypertension Assessment & Plan: Stable.  Continue losartan.   Insomnia, unspecified type -     traZODone HCl; Take 0.5 tablets (75 mg total) by mouth at bedtime as needed for sleep.  Type 2 diabetes mellitus without complication, without long-term current use of insulin (HCC) Assessment & Plan: Doing extremely well.  Last A1c 5.2.  Discontinuing Ozempic.   Anxiety and depression Assessment & Plan: Following psychiatry.  Repeat EKG performed today to assess for prolonged  QT and QTc was 382.   Hyperlipidemia, unspecified hyperlipidemia type Assessment & Plan: Continue Crestor.    Follow-up: 6 months  Nisha Dhami Adriana Simas DO [facility]

## 2024-01-10 NOTE — Assessment & Plan Note (Signed)
 Continue Crestor

## 2024-01-10 NOTE — Assessment & Plan Note (Signed)
 Stable. Continue losartan

## 2024-01-10 NOTE — Patient Instructions (Signed)
 Stop ozempic.  QT 382 (normal).  Follow up in 6 months.  Take care  Dr. Adriana Simas

## 2024-01-17 ENCOUNTER — Telehealth: Payer: Self-pay | Admitting: Neurology

## 2024-01-17 NOTE — Telephone Encounter (Signed)
 Patient states the Migraine pill imitrex is making her sick when she takes it. Would like to know if we can call her in something different  to the [facility] in [place]   Also she was to have a Xray or something for her back and has not heard anything  she would like the status of that

## 2024-02-02 ENCOUNTER — Other Ambulatory Visit: Payer: Self-pay

## 2024-02-02 ENCOUNTER — Emergency Department (HOSPITAL_COMMUNITY)
Admission: EM | Admit: 2024-02-02 | Discharge: 2024-02-03 | Disposition: A | Discharge status: Home / Self Care | Attending: Emergency Medicine | Admitting: Emergency Medicine

## 2024-02-02 ENCOUNTER — Emergency Department (HOSPITAL_COMMUNITY)

## 2024-02-02 DIAGNOSIS — N644 Mastodynia: Secondary | ICD-10-CM | POA: Diagnosis not present

## 2024-02-02 DIAGNOSIS — R0789 Other chest pain: Secondary | ICD-10-CM | POA: Diagnosis not present

## 2024-02-02 DIAGNOSIS — R590 Localized enlarged lymph nodes: Secondary | ICD-10-CM | POA: Diagnosis not present

## 2024-02-02 DIAGNOSIS — S299XXA Unspecified injury of thorax, initial encounter: Secondary | ICD-10-CM | POA: Diagnosis not present

## 2024-02-02 DIAGNOSIS — R918 Other nonspecific abnormal finding of lung field: Secondary | ICD-10-CM | POA: Diagnosis not present

## 2024-02-02 DIAGNOSIS — R079 Chest pain, unspecified: Secondary | ICD-10-CM | POA: Diagnosis present

## 2024-02-02 DIAGNOSIS — R0781 Pleurodynia: Secondary | ICD-10-CM | POA: Diagnosis not present

## 2024-02-02 LAB — BASIC METABOLIC PANEL WITH GFR
Anion gap: 6 (ref 5–15)
BUN: 12 mg/dL (ref 6–20)
CO2: 26 mmol/L (ref 22–32)
Calcium: 9 mg/dL (ref 8.9–10.3)
Chloride: 104 mmol/L (ref 98–111)
Creatinine, Ser: 0.7 mg/dL (ref 0.44–1.00)
GFR, Estimated: >60 mL/min (ref >60)
Glucose, Bld: 91 mg/dL (ref 70–99)
Potassium: 3.8 mmol/L (ref 3.5–5.1)
Sodium: 136 mmol/L (ref 135–145)

## 2024-02-02 LAB — CBC
HCT: 38.7 % (ref 36.0–46.0)
Hemoglobin: 12.7 g/dL (ref 12.0–15.0)
MCH: 29.9 pg (ref 26.0–34.0)
MCHC: 32.8 g/dL (ref 30.0–36.0)
MCV: 91.1 fL (ref 80.0–100.0)
Platelets: 239 10*3/uL (ref 150–400)
RBC: 4.25 MIL/uL (ref 3.87–5.11)
RDW: 13.2 % (ref 11.5–15.5)
WBC: 6.8 10*3/uL (ref 4.0–10.5)
nRBC: 0 % (ref 0.0–0.2)

## 2024-02-02 LAB — TROPONIN I (HIGH SENSITIVITY)
Troponin I (High Sensitivity): 2 ng/L (ref <18)
Troponin I (High Sensitivity): <2 ng/L (ref <18)

## 2024-02-02 NOTE — ED Triage Notes (Signed)
 Pt c/o R sided rib and breast [ain x Saturday [place] and was "jolted" on the ride and felt a pop on the R side, states she has taken hydrocodone that she has Rx for her HA and took ome for this without relief of pain. Rates pain 10/10. Also states t hurts to breath and lift arm since Saturday after this occurred

## 2024-02-03 ENCOUNTER — Emergency Department (HOSPITAL_COMMUNITY)

## 2024-02-03 DIAGNOSIS — S299XXA Unspecified injury of thorax, initial encounter: Secondary | ICD-10-CM | POA: Diagnosis not present

## 2024-02-03 DIAGNOSIS — R590 Localized enlarged lymph nodes: Secondary | ICD-10-CM | POA: Diagnosis not present

## 2024-02-03 DIAGNOSIS — R918 Other nonspecific abnormal finding of lung field: Secondary | ICD-10-CM | POA: Diagnosis not present

## 2024-02-03 LAB — HEPATIC FUNCTION PANEL
ALT: 16 U/L (ref 0–44)
AST: 15 U/L (ref 15–41)
Albumin: 3.9 g/dL (ref 3.5–5.0)
Alkaline Phosphatase: 86 U/L (ref 38–126)
Bilirubin, Direct: 0.1 mg/dL (ref 0.0–0.2)
Indirect Bilirubin: 0.5 mg/dL (ref 0.3–0.9)
Total Bilirubin: 0.6 mg/dL (ref 0.0–1.2)
Total Protein: 6.8 g/dL (ref 6.5–8.1)

## 2024-02-03 MED ORDER — KETOROLAC TROMETHAMINE 60 MG/2ML IM SOLN
30.0000 mg | Freq: Once | INTRAMUSCULAR | Status: AC
  Administered 2024-02-03: 30 mg via INTRAMUSCULAR
  Filled 2024-02-03: qty 2

## 2024-02-03 MED ORDER — OXYCODONE-ACETAMINOPHEN 5-325 MG PO TABS: 1.0000 | ORAL_TABLET | Freq: Three times a day (TID) | ORAL | 0 refills | Status: DC | PRN

## 2024-02-03 MED ORDER — OXYCODONE-ACETAMINOPHEN 5-325 MG PO TABS
2.0000 | ORAL_TABLET | Freq: Once | ORAL | Status: AC
  Administered 2024-02-03: 2 via ORAL
  Filled 2024-02-03: qty 2

## 2024-02-03 MED ORDER — NAPROXEN 500 MG PO TABS: 500.0000 mg | ORAL_TABLET | Freq: Two times a day (BID) | ORAL | 0 refills | Status: DC

## 2024-02-03 NOTE — ED Notes (Signed)
 Patient transported to CT
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
## 2024-03-02 DIAGNOSIS — F411 Generalized anxiety disorder: Secondary | ICD-10-CM | POA: Diagnosis not present

## 2024-03-02 DIAGNOSIS — F410 Panic disorder [episodic paroxysmal anxiety] without agoraphobia: Secondary | ICD-10-CM | POA: Diagnosis not present

## 2024-03-02 DIAGNOSIS — F332 Major depressive disorder, recurrent severe without psychotic features: Secondary | ICD-10-CM | POA: Diagnosis not present

## 2024-03-13 ENCOUNTER — Telehealth: Payer: Self-pay | Admitting: Neurology

## 2024-03-13 NOTE — Telephone Encounter (Signed)
 X ray of Cervical Spine sent to [facility]. Order printed at the front desk for patient to come by and pick up if she has any further issues.

## 2024-03-13 NOTE — Telephone Encounter (Signed)
 Pt called in and left a message. She stated she has not heard from anyone regarding imaging of her spine.

## 2024-03-18 ENCOUNTER — Other Ambulatory Visit: Payer: Self-pay | Admitting: Gastroenterology

## 2024-03-18 DIAGNOSIS — K219 Gastro-esophageal reflux disease without esophagitis: Secondary | ICD-10-CM

## 2024-03-20 DIAGNOSIS — Z79620 Long term (current) use of immunosuppressive biologic: Secondary | ICD-10-CM | POA: Diagnosis not present

## 2024-03-20 DIAGNOSIS — Z79631 Long term (current) use of antimetabolite agent: Secondary | ICD-10-CM | POA: Diagnosis not present

## 2024-03-20 DIAGNOSIS — Z87891 Personal history of nicotine dependence: Secondary | ICD-10-CM | POA: Diagnosis not present

## 2024-03-20 DIAGNOSIS — D860 Sarcoidosis of lung: Secondary | ICD-10-CM | POA: Diagnosis not present

## 2024-03-20 DIAGNOSIS — D84821 Immunodeficiency due to drugs: Secondary | ICD-10-CM | POA: Diagnosis not present

## 2024-03-20 DIAGNOSIS — D869 Sarcoidosis, unspecified: Secondary | ICD-10-CM | POA: Diagnosis not present

## 2024-03-20 DIAGNOSIS — D8689 Sarcoidosis of other sites: Secondary | ICD-10-CM | POA: Diagnosis not present

## 2024-04-18 DIAGNOSIS — Z79899 Other long term (current) drug therapy: Secondary | ICD-10-CM | POA: Diagnosis not present

## 2024-04-18 DIAGNOSIS — D849 Immunodeficiency, unspecified: Secondary | ICD-10-CM | POA: Diagnosis not present

## 2024-04-18 DIAGNOSIS — D860 Sarcoidosis of lung: Secondary | ICD-10-CM | POA: Diagnosis not present

## 2024-04-18 DIAGNOSIS — M797 Fibromyalgia: Secondary | ICD-10-CM | POA: Diagnosis not present

## 2024-04-24 DIAGNOSIS — F411 Generalized anxiety disorder: Secondary | ICD-10-CM | POA: Diagnosis not present

## 2024-04-24 DIAGNOSIS — F410 Panic disorder [episodic paroxysmal anxiety] without agoraphobia: Secondary | ICD-10-CM | POA: Diagnosis not present

## 2024-04-24 DIAGNOSIS — F332 Major depressive disorder, recurrent severe without psychotic features: Secondary | ICD-10-CM | POA: Diagnosis not present

## 2024-05-01 DIAGNOSIS — F411 Generalized anxiety disorder: Secondary | ICD-10-CM | POA: Diagnosis not present

## 2024-05-01 DIAGNOSIS — F410 Panic disorder [episodic paroxysmal anxiety] without agoraphobia: Secondary | ICD-10-CM | POA: Diagnosis not present

## 2024-05-01 DIAGNOSIS — F332 Major depressive disorder, recurrent severe without psychotic features: Secondary | ICD-10-CM | POA: Diagnosis not present

## 2024-05-03 ENCOUNTER — Ambulatory Visit: Payer: Medicaid Other | Admitting: Nurse Practitioner

## 2024-05-03 ENCOUNTER — Encounter: Payer: Self-pay | Admitting: Nurse Practitioner

## 2024-05-03 VITALS — BP 98/76 | HR 89 | Ht 63.0 in | Wt 167.8 lb

## 2024-05-03 DIAGNOSIS — E782 Mixed hyperlipidemia: Secondary | ICD-10-CM

## 2024-05-03 DIAGNOSIS — I10 Essential (primary) hypertension: Secondary | ICD-10-CM | POA: Diagnosis not present

## 2024-05-03 DIAGNOSIS — Z794 Long term (current) use of insulin: Secondary | ICD-10-CM

## 2024-05-03 DIAGNOSIS — Z7985 Long-term (current) use of injectable non-insulin antidiabetic drugs: Secondary | ICD-10-CM | POA: Diagnosis not present

## 2024-05-03 DIAGNOSIS — E119 Type 2 diabetes mellitus without complications: Secondary | ICD-10-CM

## 2024-05-03 LAB — POCT GLYCOSYLATED HEMOGLOBIN (HGB A1C): Hemoglobin A1C: 5.7 % — AB (ref 4.0–5.6)

## 2024-05-03 MED ORDER — OZEMPIC (0.25 OR 0.5 MG/DOSE) 2 MG/3ML ~~LOC~~ SOPN: 0.5000 mg | PEN_INJECTOR | SUBCUTANEOUS | 3 refills | Status: DC

## 2024-05-03 NOTE — Progress Notes (Signed)
 Endocrinology Follow Up Note       05/03/2024, 9:56 AM   Subjective:    Patient ID: Courtney Hale, female    DOB: 01/16/1966.  LASONIA CASINO is being seen in follow up after being seen in consultation for management of currently uncontrolled symptomatic diabetes requested by  Cook, Jayce G, DO.   Past Medical History:  Diagnosis Date   Cancer [facility])    Skin   Complication of anesthesia    woke up during endoscopy and colonoscopy   Depression    Diabetes (HCC)    Fibromyalgia    GERD (gastroesophageal reflux disease)    Hemorrhoids    History of esophageal stricture    2011  peptic w/  dilatation   History of gastritis    2011   History of melanoma excision    2015  left leg/   12-09-2015 back    Hypertension    Microhematuria    Migraines    Nephrolithiasis    left side non-obstructive    Right ureteral stone    Sarcoidosis 07/24/2021   Urgency of urination    Wears contact lenses     Past Surgical History:  Procedure Laterality Date   ABDOMINAL HYSTERECTOMY     BALLOON DILATION N/A 05/03/2022   Procedure: BALLOON DILATION;  Surgeon: Cindie Carlin POUR, DO;  [facility];  Service: Endoscopy;  Laterality: N/A;   BIOPSY  11/16/2021   Procedure: BIOPSY;  Surgeon: Cindie Carlin POUR, DO;  [facility];  Service: Endoscopy;;   BIOPSY  05/03/2022   Procedure: BIOPSY;  Surgeon: Cindie Carlin POUR, DO;  [facility];  Service: Endoscopy;;   CARPAL TUNNEL RELEASE Right 10/29/2013   COLONOSCOPY N/A 08/27/2015   Procedure: COLONOSCOPY;  Surgeon: Margo LITTIE Haddock, MD;  [facility];  Service: Endoscopy;  Laterality: N/A;  0830   COLONOSCOPY WITH PROPOFOL  N/A 11/16/2021   Surgeon: Cindie Carlin POUR, DO;  Nonbleeding internal hemorrhoids, otherwise normal exam.  Recommended 10-year screening colonoscopy.   CYST EXCISION  10/25/2010   right hand   CYSTOSCOPY W/  URETERAL STENT PLACEMENT Right 12/22/2015   Procedure: CYSTOSCOPY WITH RETROGRADE PYELOGRAM/URETERAL STENT PLACEMENT;  Surgeon: Redell Lynwood Napoleon, MD;  Location: [facility];  Service: Urology;  Laterality: Right;   CYSTOSCOPY WITH RETROGRADE PYELOGRAM, URETEROSCOPY AND STENT PLACEMENT Right 01/05/2016   Procedure: CYSTOSCOPY WITH RETROGRADE PYELOGRAM, URETEROSCOPY AND STENT REPLACEMENT;  Surgeon: Redell Lynwood Napoleon, MD;  Location: [facility];  Service: Urology;  Laterality: Right;   DILATION AND CURETTAGE OF UTERUS  03/12/2009   w/  Suction   double balloon enteroscopy     Dr. Myriam at [facility]: no erosions, no evidence of Crohn's disease, path without Crohn's.    EGD with push enteroscopy  02/12/2010    patent distal peptic stricture with diffuse antral erythema, normal D1 and D2    ESOPHAGOGASTRODUODENOSCOPY (EGD) WITH PROPOFOL  N/A 11/16/2021   Surgeon: Cindie Carlin POUR, DO;   Gastritis biopsied, otherwise normal exam.  Pathology with mild chronic gastritis, negative for H. pylori.   ESOPHAGOGASTRODUODENOSCOPY (EGD) WITH PROPOFOL  N/A 05/03/2022   Surgeon: Cindie Carlin POUR, DO;  Normal  esophagus s/p empiric dilation, gastritis biopsied.  Pathology with mild chronic, focally active gastritis, negative for H. pylori.   HAND SURGERY     LAPAROSCOPIC ASSISTED VAGINAL HYSTERECTOMY  10/26/2014   w/  Bilateral Salpingoophorectomy   STONE EXTRACTION WITH BASKET Right 01/05/2016   Procedure: STONE EXTRACTION WITH BASKET;  Surgeon: Redell Lynwood Napoleon, MD;  Location: [facility];  Service: Urology;  Laterality: Right;   TONSILLECTOMY  1998  approx    Social History   Socioeconomic History   Marital status: Single    Spouse name: Not on file   Number of children: Not on file   Years of education: Not on file   Highest education level: Not on file  Occupational History   Not on file  Tobacco Use   Smoking status: Never    Passive exposure: Past   Smokeless  tobacco: Never  Vaping Use   Vaping status: Never Used  Substance and Sexual Activity   Alcohol use: Not Currently   Drug use: No   Sexual activity: Yes  Other Topics Concern   Not on file  Social History Narrative   Not on file   Social Drivers of Health   Financial Resource Strain: Low Risk  (06/30/2021)   Overall Financial Resource Strain (CARDIA)    Difficulty of Paying Living Expenses: Not hard at all  Food Insecurity: No Food Insecurity (04/18/2024)   Received from [facility]   Hunger Vital Sign    Within the past 12 months, you worried that your food would run out before you got the money to buy more.: Never true    Within the past 12 months, the food you bought just didn't last and you didn't have money to get more.: Never true  Transportation Needs: No Transportation Needs (04/18/2024)   Received from [facility] - Transportation    In the past 12 months, has lack of transportation kept you from medical appointments or from getting medications?: No    Lack of Transportation (Non-Medical): No  Physical Activity: Sufficiently Active (11/21/2023)   Received from [facility]   Exercise Vital Sign    On average, how many days per week do you engage in moderate to strenuous exercise (like a brisk walk)?: 7 days    On average, how many minutes do you engage in exercise at this level?: 30 min  Stress: Stress Concern Present (11/21/2023)   Received from [facility] - Occupational Stress Questionnaire    Feeling of Stress : Rather much  Social Connections: Socially Isolated (06/30/2021)   Social Connection and Isolation Panel    Frequency of Communication with Friends and Family: More than three times a week    Frequency of Social Gatherings with Friends and Family: More than three times a week    Attends Religious Services: Never    Database administrator or Organizations: No    Attends  Engineer, structural: Never    Marital Status: Never married    Family History  Problem Relation Age of Onset   Hypertension Mother    Thyroid  disease Mother    Diabetes Mother    Hyperlipidemia Mother    Stroke Mother    Heart disease Mother    Hyperlipidemia Father    Hypertension Father    Heart attack Father    Heart failure Father    Dementia Maternal Aunt  Dementia Paternal Aunt    Colon cancer Neg Hx     Outpatient Encounter Medications as of 05/03/2024  Medication Sig   albuterol  (VENTOLIN  HFA) 108 (90 Base) MCG/ACT inhaler Inhale 2 puffs into the lungs every 6 (six) hours as needed for wheezing or shortness of breath.   BD PEN NEEDLE NANO 2ND GEN 32G X 4 MM MISC USE AS DIRECTED   buPROPion (WELLBUTRIN XL) 300 MG 24 hr tablet Take 300 mg by mouth every morning.   busPIRone  (BUSPAR ) 5 MG tablet TAKE 1 TABLET BY MOUTH THREE TIMES DAILY   calcium  carbonate (TUMS - DOSED IN MG ELEMENTAL CALCIUM ) 500 MG chewable tablet Chew 1,500 mg by mouth 2 (two) times daily as needed for indigestion or heartburn.   Chromium 1000 MCG TABS Take 1,000 mcg by mouth daily.   folic acid  (FOLVITE ) 1 MG tablet SMARTSIG:1 Tablet(s) By Mouth Morning-Night   Galcanezumab -gnlm (EMGALITY ) 120 MG/ML SOAJ Inject 120 mg into the skin every 28 (twenty-eight) days.   HUMIRA, 2 PEN, 40 MG/0.4ML PNKT INJECT 40MG  SUBCUTANEOUSLY EVERY 14 DAYS.   lansoprazole  (PREVACID ) 30 MG capsule TAKE 1 CAPSULE BY MOUTH TWICE DAILY BEFORE A MEAL   losartan  (COZAAR ) 100 MG tablet TAKE 1 TABLET BY MOUTH ONCE DAILY.   methotrexate  (RHEUMATREX) 2.5 MG tablet Take 8 tablets (20 mg total) by mouth once a week.   naproxen  (NAPROSYN ) 500 MG tablet Take 1 tablet (500 mg total) by mouth 2 (two) times daily.   oxyCODONE -acetaminophen  (PERCOCET) 5-325 MG tablet Take 1 tablet by mouth every 8 (eight) hours as needed for severe pain (pain score 7-10).   Pancrelipase , Lip-Prot-Amyl, (ZENPEP ) 60000-189600 units CPEP Take 60,000  Units by mouth 3 (three) times daily with meals. And 1 capsule with snacks. Max of 5 capsules a day.   PARoxetine  (PAXIL ) 10 MG tablet Take 1 tablet by mouth once daily   PARoxetine  (PAXIL ) 40 MG tablet TAKE 1 TABLET BY MOUTH ONCE DAILY IN THE MORNING   polyethylene glycol powder (GLYCOLAX /MIRALAX ) 17 GM/SCOOP powder Take two capfuls daily in 12-16 ounces of water . Once bowel movements are soft, you can drop down to one capful daily.   pregabalin (LYRICA) 25 MG capsule Take by mouth.   Probiotic Product (PROBIOTIC PO) Take 1 capsule by mouth daily.   psyllium (REGULOID) 0.52 g capsule Take 1 capsule by mouth daily.   QUEtiapine (SEROQUEL XR) 50 MG TB24 24 hr tablet Take 50 mg by mouth at bedtime.   rosuvastatin  (CRESTOR ) 10 MG tablet Take 1 tablet (10 mg total) by mouth daily.   Semaglutide ,0.25 or 0.5MG /DOS, (OZEMPIC , 0.25 OR 0.5 MG/DOSE,) 2 MG/3ML SOPN Inject 0.5 mg into the skin once a week.   sucralfate  (CARAFATE ) 1 g tablet Take 1 tablet (1 g total) by mouth 4 (four) times daily -  with meals and at bedtime.   SUMAtriptan  (IMITREX ) 100 MG tablet Take 1 tablet at earliest onset of migraine.  May repeat in 2 hours if headache persists or recurs.  Maximum 2 tablets in 24 hours.   valACYclovir (VALTREX) 500 MG tablet Take 500 mg by mouth 2 (two) times daily as needed (outbreaks).   zolpidem  (AMBIEN ) 10 MG tablet Take 10 mg by mouth at bedtime.   Continuous Blood Gluc Sensor (FREESTYLE LIBRE 2 SENSOR) MISC Use sensor as directed. Change every 10-14 days. (Patient not taking: Reported on 05/03/2024)   traZODone  (DESYREL ) 150 MG tablet Take 0.5 tablets (75 mg total) by mouth at bedtime as needed for sleep. (Patient  not taking: Reported on 05/03/2024)   No facility-administered encounter medications on file as of 05/03/2024.    ALLERGIES: Allergies  Allergen Reactions   Sulfa Antibiotics Other (See Comments) and Hives    Burning sensation, flushing to skin  Other Reaction(s): Other (See Comments)    Topamax  [Topiramate ] Other (See Comments)    Developed kidney stones   Fluorouracil Swelling    Burning skin    VACCINATION STATUS: Immunization History  Administered Date(s) Administered   Influenza, Seasonal, Injecte, Preservative Fre 09/20/2023   Influenza,inj,Quad PF,6+ Mos 09/09/2016, 08/29/2017, 09/27/2019, 10/30/2020, 08/14/2021   Influenza-Unspecified 08/22/2013, 07/22/2014, 08/08/2015   MMR 01/22/2011   PFIZER(Purple Top)SARS-COV-2 Vaccination 12/30/2019, 01/20/2020   PNEUMOCOCCAL CONJUGATE-20 01/10/2024   Tdap 01/22/2011, 04/17/2014    Diabetes She presents for her follow-up diabetic visit. She has type 2 diabetes mellitus. Onset time: diagnosed at approx age of 74. Her disease course has been stable. There are no hypoglycemic associated symptoms. Pertinent negatives for diabetes include no weight loss. There are no hypoglycemic complications. There are no diabetic complications. Risk factors for coronary artery disease include diabetes mellitus, dyslipidemia, family history, hypertension and obesity. Current diabetic treatments: Ozempic  only. She is compliant with treatment most of the time. Her weight is fluctuating minimally. She is following a generally healthy diet. When asked about meal planning, she reported none. She has not had a previous visit with a dietitian. She participates in exercise intermittently. Her home blood glucose trend is fluctuating minimally. Her overall blood glucose range is 90-110 mg/dl. (She presents today with her logs, no meter, showing at goal glycemic profile (glucose ranging between 79-103).  Her POCT A1c today is 5.7%, increasing from previous A1c of 5.2%.  She denies any hypoglycemia.  She notes she has not had any stomach pain with the Ozempic  recently.  She did gain 8 lbs since last visit, she does not understand why as nothing has changed with diet or medications.) An ACE inhibitor/angiotensin II receptor blocker is being taken. She does not  see a podiatrist.Eye exam is current.  Hyperlipidemia This is a chronic problem. The current episode started more than 1 year ago. The problem is uncontrolled. Recent lipid tests were reviewed and are high. Exacerbating diseases include diabetes and obesity. Factors aggravating her hyperlipidemia include thiazides and fatty foods. She is currently on no antihyperlipidemic treatment. Compliance problems include adherence to diet and adherence to exercise.  Risk factors for coronary artery disease include diabetes mellitus, dyslipidemia, family history, obesity and hypertension.  Hypertension This is a chronic problem. The current episode started more than 1 year ago. The problem has been resolved since onset. The problem is controlled. Agents associated with hypertension include steroids. Risk factors for coronary artery disease include diabetes mellitus, dyslipidemia, family history and obesity. Past treatments include diuretics and angiotensin blockers. The current treatment provides significant improvement. There are no compliance problems.      Review of systems  Constitutional: + increasing body weight, current Body mass index is 29.72 kg/m., no fatigue, no subjective hyperthermia, no subjective hypothermia Eyes: no blurry vision, no xerophthalmia ENT: no sore throat, no nodules palpated in throat, no dysphagia/odynophagia, no hoarseness Cardiovascular: no chest pain, no shortness of breath, no palpitations, no leg swelling Respiratory: no cough, no shortness of breath Gastrointestinal: no nausea/vomiting/diarrhea Musculoskeletal: generalized aches and pains- says r/t sarcoidosis (controlled) Skin: no rashes, no hyperemia Neurological: no tremors, no numbness, no tingling, no dizziness Psychiatric: no depression, no anxiety  Objective:     BP 98/76 (BP  Location: Right Arm, Patient Position: Sitting, Cuff Size: Large)   Pulse 89   Ht 5' 3 (1.6 m)   Wt 167 lb 12.8 oz (76.1 kg)   LMP  10/26/2014   BMI 29.72 kg/m   Wt Readings from Last 3 Encounters:  05/03/24 167 lb 12.8 oz (76.1 kg)  01/10/24 159 lb 12.8 oz (72.5 kg)  12/01/23 157 lb (71.2 kg)     BP Readings from Last 3 Encounters:  05/03/24 98/76  02/03/24 131/88  01/10/24 124/84      Physical Exam- Limited  Constitutional:  Body mass index is 29.72 kg/m. , not in acute distress, normal state of mind Eyes:  EOMI, no exophthalmos Musculoskeletal: no gross deformities, strength intact in all four extremities, no gross restriction of joint movements Skin:  no rashes, no hyperemia Neurological: no tremor with outstretched hands  Diabetic Foot Exam - Simple   No data filed     CMP ( most recent) CMP     Component Value Date/Time   NA 136 02/02/2024 1934   NA 140 11/25/2023 1205   K 3.8 02/02/2024 1934   CL 104 02/02/2024 1934   CO2 26 02/02/2024 1934   GLUCOSE 91 02/02/2024 1934   BUN 12 02/02/2024 1934   BUN 15 11/25/2023 1205   CREATININE 0.70 02/02/2024 1934   CREATININE 0.66 07/15/2022 1511   CALCIUM  9.0 02/02/2024 1934   PROT 6.8 02/02/2024 2104   PROT 6.5 11/25/2023 1205   ALBUMIN 3.9 02/02/2024 2104   ALBUMIN 4.4 11/25/2023 1205   AST 15 02/02/2024 2104   ALT 16 02/02/2024 2104   ALKPHOS 86 02/02/2024 2104   BILITOT 0.6 02/02/2024 2104   BILITOT 0.6 11/25/2023 1205   GFRNONAA >60 02/02/2024 1934   GFRAA 121 08/27/2020 1517     Diabetic Labs (most recent): Lab Results  Component Value Date   HGBA1C 5.7 (A) 05/03/2024   HGBA1C 5.2 10/12/2023   HGBA1C 4.9 03/01/2023   MICROALBUR 10mg /L 07/29/2022     Lipid Panel ( most recent) Lipid Panel     Component Value Date/Time   CHOL 237 (H) 10/12/2023 1053   TRIG 110 10/12/2023 1053   HDL 56 10/12/2023 1053   CHOLHDL 4.2 10/12/2023 1053   LDLCALC 161 (H) 10/12/2023 1053   LABVLDL 20 10/12/2023 1053      Lab Results  Component Value Date   TSH 0.796 02/24/2023   TSH 0.93 07/15/2022   TSH 0.996 08/27/2020   TSH 1.220  06/09/2016   TSH 0.785 08/13/2015   FREET4 1.36 02/24/2023           Assessment & Plan:   1) Controlled type 2 diabetes mellitus without complication  She presents today with her logs, no meter, showing at goal glycemic profile (glucose ranging between 79-103).  Her POCT A1c today is 5.7%, increasing from previous A1c of 5.2%.  She denies any hypoglycemia.  She notes she has not had any stomach pain with the Ozempic  recently.  She did gain 8 lbs since last visit, she does not understand why as nothing has changed with diet or medications.  - RAINNA NEARHOOD has currently uncontrolled symptomatic type 2 DM since 58 years of age.   -Recent labs reviewed.  - I had a long discussion with her about the progressive nature of diabetes and the pathology behind its complications. -her diabetes is complicated by chronic steroid use due to sarcoidosis and she remains at a high risk for more acute and chronic  complications which include CAD, CVA, CKD, retinopathy, and neuropathy. These are all discussed in detail with her.  The following Lifestyle Medicine recommendations according to American College of Lifestyle Medicine [facility]) were discussed and offered to patient and she agrees to start the journey:  A. Whole Foods, Plant-based plate comprising of fruits and vegetables, plant-based proteins, whole-grain carbohydrates was discussed in detail with the patient.   A list for source of those nutrients were also provided to the patient.  Patient will use only water  or unsweetened tea for hydration. B.  The need to stay away from risky substances including alcohol, smoking; obtaining 7 to 9 hours of restorative sleep, at least 150 minutes of moderate intensity exercise weekly, the importance of healthy social connections,  and stress reduction techniques were discussed. C.  A full color page of  Calorie density of various food groups per pound showing examples of each food groups was provided to the  patient.  - Nutritional counseling repeated at each appointment due to patients tendency to fall back in to old habits.  - The patient admits there is a room for improvement in their diet and drink choices. -  Suggestion is made for the patient to avoid simple carbohydrates from their diet including Cakes, Sweet Desserts / Pastries, Ice Cream, Soda (diet and regular), Sweet Tea, Candies, Chips, Cookies, Sweet Pastries, Store Bought Juices, Alcohol in Excess of 1-2 drinks a day, Artificial Sweeteners, Coffee Creamer, and Sugar-free Products. This will help patient to have stable blood glucose profile and potentially avoid unintended weight gain.   - I encouraged the patient to switch to unprocessed or minimally processed complex starch and increased protein intake (animal or plant source), fruits, and vegetables.   - Patient is advised to stick to a routine mealtimes to eat 3 meals a day and avoid unnecessary snacks (to snack only to correct hypoglycemia).  - she sees Santana Duke, RDN, CDE for diabetes education.  - I have approached her with the following individualized plan to manage her diabetes and patient agrees:   -She is advised to continue Ozempic  0.5 mg SQ weekly.  She has maintained excellent control of her diabetes on this medication, thus is to continue.  -She does not need to monitor glucose routinely, however, may find it helpful to check fasting glucose 1-2 times per week to keep an eye on things.  - She is allergic to sulfa meds.  - Specific targets for  A1c; LDL, HDL, and Triglycerides were discussed with the patient.  2) Blood Pressure /Hypertension:  her blood pressure is controlled to target.   she is advised to continue her current medications including Losartan  100 mg p.o. daily with breakfast and HCTZ 25 mg po daily.  3) Lipids/Hyperlipidemia:    Review of her recent lipid panel from 10/12/23 showed uncontrolled LDL at 161 (improved).  Her PCP recently started  her on statin medication. She is advised to avoid fried foods and butter.  4)  Weight/Diet:  her Body mass index is 29.72 kg/m.  -  clearly complicating her diabetes care.   she is a candidate for weight loss. I discussed with her the fact that loss of 5 - 10% of her  current body weight will have the most impact on her diabetes management.  Exercise, and detailed carbohydrates information provided  -  detailed on discharge instructions.  5) Vitamin D  deficiency Currently on OTC vitamin D3 supplement.  Recent vitamin D  level from 02/24/23 was 80.2.  She can  stop her vitamin d  supplement for now.  6) Chronic Care/Health Maintenance: -she is on ACEI/ARB and not on Statin medications and is encouraged to initiate and continue to follow up with Ophthalmology, Dentist, Podiatrist at least yearly or according to recommendations, and advised to stay away from smoking. I have recommended yearly flu vaccine and pneumonia vaccine at least every 5 years; moderate intensity exercise for up to 150 minutes weekly; and sleep for at least 7 hours a day.  - she is advised to maintain close follow up with Cook, Jayce G, DO for primary care needs, as well as her other providers for optimal and coordinated care.     I spent  28  minutes in the care of the patient today including review of labs from CMP, Lipids, Thyroid  Function, Hematology (current and previous including abstractions from other facilities); face-to-face time discussing  her blood glucose readings/logs, discussing hypoglycemia and hyperglycemia episodes and symptoms, medications doses, her options of short and long term treatment based on the latest standards of care / guidelines;  discussion about incorporating lifestyle medicine;  and documenting the encounter. Risk reduction counseling performed per USPSTF guidelines to reduce obesity and cardiovascular risk factors.     Please refer to Patient Instructions for Blood Glucose Monitoring and  Insulin /Medications Dosing Guide  in media tab for additional information. Please  also refer to  Patient Self Inventory in the Media  tab for reviewed elements of pertinent patient history.  Therisa LITTIE Glatter participated in the discussions, expressed understanding, and voiced agreement with the above plans.  All questions were answered to her satisfaction. she is encouraged to contact clinic should she have any questions or concerns prior to her return visit.     Follow up plan: - Return in about 6 months (around 11/03/2024) for Diabetes F/U with A1c in office, No previsit labs.  Benton Rio, [facility] [address] [place], [place] [address] Phone: [phone] Fax: [phone]  05/03/2024, 9:56 AM

## 2024-05-09 DIAGNOSIS — F410 Panic disorder [episodic paroxysmal anxiety] without agoraphobia: Secondary | ICD-10-CM | POA: Diagnosis not present

## 2024-05-09 DIAGNOSIS — F332 Major depressive disorder, recurrent severe without psychotic features: Secondary | ICD-10-CM | POA: Diagnosis not present

## 2024-05-09 DIAGNOSIS — F411 Generalized anxiety disorder: Secondary | ICD-10-CM | POA: Diagnosis not present

## 2024-05-23 DIAGNOSIS — F410 Panic disorder [episodic paroxysmal anxiety] without agoraphobia: Secondary | ICD-10-CM | POA: Diagnosis not present

## 2024-05-23 DIAGNOSIS — F411 Generalized anxiety disorder: Secondary | ICD-10-CM | POA: Diagnosis not present

## 2024-05-23 DIAGNOSIS — F332 Major depressive disorder, recurrent severe without psychotic features: Secondary | ICD-10-CM | POA: Diagnosis not present

## 2024-05-30 NOTE — Progress Notes (Unsigned)
 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 Limit use of pain relievers to no more than 9 days out of the month to prevent risk of rebound or medication-overuse headache. Keep headache diary Follow up 8 months.   Subjective:  DAQUANA PADDOCK is a 58 year old female with HTN, sarcoidosis, DM, and history of melanoma and kidney stone who follows up for headaches.  UPDATE:   *** Intensity:  Severe Duration:  minutes with sumatriptan  (however it makes her feels weird but manageable for now) Frequency:  once a month  Occipital headaches occur 5 or 6 times a month.  Neck pops.   Cervical X-ray was ordered but not performed.  ***   Current NSAIDS/analgesics:  Vicodin (chronic pain) Current triptans: sumatriptan  100mg  Current ergotamine:  none Current anti-emetic:  Zofran  4mg  Current muscle relaxants:  Flexeril  10mg  Current Antihypertensive medications:  losartan , HCTZ Current Antidepressant medications:  paroxetine  40mg  daily Current Anticonvulsant medications:  none Current anti-CGRP:  Emgality  Other therapy:  no Birth control:  no Other medications:  Humira, methotrexate , buspirone , trazodone    Caffeine:   Occasional iced coffee or diet soft drink Diet:  Not enough water .  Skips meals Exercise:  no - due to her medications (chemo and Humira), fatigued Depression:  yes; Anxiety:  yes.  She will be starting to see a therapist.   Other pain:  diffuse Sleep hygiene:  1-2 hours a night on trazodone .  Anxiety.  Brain doesn't shut off.    HISTORY: History of classic migraines with headaches involving various locations in the head.   She has sarcoidosis with multiple lesions to the bone.  About 5 months ago, she began experiencing an occipital headache described as a throbbing pain across the back of the head and involving the back of the neck.  Scalp tender to touch.  It is a persistent dull ache but fluctuates in intensity.  When severe, it is associated with blurred vision, photophobia and phonophobia.  She has nausea but she is always nauseous due to medications.  Lasts a couple of hours and occurs 2 to 3 times a week.  Any pressure on the back of head either by palpation or while laying supine, aggravates it.  Not aggravated by neck movement. She notes blurred vision in the left eye and plans to see her eye doctor.  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    MRI of brain with and without contrast on 07/08/2021 personally reviewed showed 2 cm lesion in left frontal bone but normal brain.    MRI of brain with and without contrast from 10/02/2023 revealed:  1. No acute or reversible finding. Normal appearance of the brain itself. 2. Continued subtle evidence of the 2 cm marrow space lesion in the left frontal lobe which no longer shows contrast enhancement. This is consistent either with a treated melanoma lesion or a sarcoid lesion of bone on the basis of the imaging. No new abnormal bone finding in the region. 3. Mucosal thickening of the maxillary sinuses.  Past NSAIDS/analgesics:  naproxen , etodolac , meloxicam  Past abortive triptans:  rizatriptan -MLT 10mg  (made head feel funny) Past abortive ergotamine:  none Past muscle relaxants:  none Past anti-emetic:  Compazine  Past antihypertensive medications:  verapamil  Past antidepressant medications:  citalopram  Past anticonvulsant medications:  topiramate  (kidney stones), pregablin Past anti-CGRP:  none Past vitamins/Herbal/Supplements:  none Past antihistamines/decongestants:  Benadryl , meclizine  Other past therapies:  none   Family  history of migraines:  no  PAST MEDICAL HISTORY: Past Medical History:  Diagnosis Date   Cancer (HCC)    Skin   Complication of anesthesia    woke up during endoscopy and colonoscopy   Depression    Diabetes (HCC)    Fibromyalgia    GERD (gastroesophageal reflux disease)    Hemorrhoids    History of esophageal stricture    2011  peptic w/  dilatation   History of gastritis    2011   History of melanoma excision    2015  left leg/   12-09-2015 back    Hypertension    Microhematuria    Migraines    Nephrolithiasis    left side non-obstructive    Right ureteral stone    Sarcoidosis 07/24/2021   Urgency of urination    Wears contact lenses     MEDICATIONS: Current Outpatient Medications on File Prior to Visit  Medication Sig Dispense Refill   albuterol  (VENTOLIN  HFA) 108 (90 Base) MCG/ACT inhaler Inhale 2 puffs into the lungs every 6 (six) hours as needed for wheezing or shortness of breath. 8 g 6   BD PEN NEEDLE NANO 2ND GEN 32G X 4 MM MISC USE AS DIRECTED 100 each 0   buPROPion (WELLBUTRIN XL) 300 MG 24 hr tablet Take 300 mg by mouth every morning.     busPIRone  (BUSPAR ) 5 MG tablet TAKE 1 TABLET BY MOUTH THREE TIMES DAILY 90 tablet 1   calcium  carbonate (TUMS - DOSED IN MG ELEMENTAL CALCIUM ) 500 MG chewable tablet Chew 1,500 mg by mouth 2 (two) times daily as needed for indigestion or heartburn.     Chromium 1000 MCG TABS Take 1,000 mcg by mouth daily.     Continuous Blood Gluc Sensor (FREESTYLE LIBRE 2 SENSOR) MISC Use sensor as directed. Change every 10-14 days. (Patient not taking: Reported on 05/03/2024) 2 each 3   folic acid  (FOLVITE ) 1 MG tablet SMARTSIG:1 Tablet(s) By Mouth Morning-Night     Galcanezumab -gnlm (EMGALITY ) 120 MG/ML SOAJ Inject 120 mg into the skin every 28 (twenty-eight) days. 1.12 mL 11   HUMIRA, 2 PEN, 40 MG/0.4ML PNKT INJECT 40MG  SUBCUTANEOUSLY EVERY 14 DAYS.     lansoprazole  (PREVACID ) 30 MG capsule TAKE 1 CAPSULE BY MOUTH TWICE DAILY BEFORE A MEAL  60 capsule 2   losartan  (COZAAR ) 100 MG tablet TAKE 1 TABLET BY MOUTH ONCE DAILY. 90 tablet 3   methotrexate  (RHEUMATREX) 2.5 MG tablet Take 8 tablets (20 mg total) by mouth once a week. 64 tablet 0   naproxen  (NAPROSYN ) 500 MG tablet Take 1 tablet (500 mg total) by mouth 2 (two) times daily. 30 tablet 0   oxyCODONE -acetaminophen  (PERCOCET) 5-325 MG tablet Take 1 tablet by mouth every 8 (eight) hours as needed for severe pain (pain score 7-10). 10 tablet 0   Pancrelipase , Lip-Prot-Amyl, (ZENPEP ) 60000-189600 units CPEP Take 60,000 Units by mouth 3 (three) times daily with meals. And 1 capsule with snacks. Max of 5 capsules a day. 180 capsule 3   PARoxetine  (PAXIL ) 10 MG tablet Take 1 tablet by mouth once daily 90 tablet 0   PARoxetine  (PAXIL ) 40 MG tablet TAKE 1 TABLET BY MOUTH ONCE DAILY IN THE MORNING 90 tablet 0   polyethylene glycol powder (  GLYCOLAX /MIRALAX ) 17 GM/SCOOP powder Take two capfuls daily in 12-16 ounces of water . Once bowel movements are soft, you can drop down to one capful daily. 255 g 3   pregabalin (LYRICA) 25 MG capsule Take by mouth.     Probiotic Product (PROBIOTIC PO) Take 1 capsule by mouth daily.     psyllium (REGULOID) 0.52 g capsule Take 1 capsule by mouth daily.     QUEtiapine (SEROQUEL XR) 50 MG TB24 24 hr tablet Take 50 mg by mouth at bedtime.     rosuvastatin  (CRESTOR ) 10 MG tablet Take 1 tablet (10 mg total) by mouth daily. 90 tablet 3   Semaglutide ,0.25 or 0.5MG /DOS, (OZEMPIC , 0.25 OR 0.5 MG/DOSE,) 2 MG/3ML SOPN Inject 0.5 mg into the skin once a week. 9 mL 3   sucralfate  (CARAFATE ) 1 g tablet Take 1 tablet (1 g total) by mouth 4 (four) times daily -  with meals and at bedtime. 120 tablet 1   SUMAtriptan  (IMITREX ) 100 MG tablet Take 1 tablet at earliest onset of migraine.  May repeat in 2 hours if headache persists or recurs.  Maximum 2 tablets in 24 hours. 10 tablet 5   traZODone  (DESYREL ) 150 MG tablet Take 0.5 tablets (75 mg total) by mouth at bedtime as needed  for sleep. (Patient not taking: Reported on 05/03/2024)     valACYclovir (VALTREX) 500 MG tablet Take 500 mg by mouth 2 (two) times daily as needed (outbreaks).     zolpidem  (AMBIEN ) 10 MG tablet Take 10 mg by mouth at bedtime.     No current facility-administered medications on file prior to visit.    ALLERGIES: Allergies  Allergen Reactions   Sulfa Antibiotics Other (See Comments) and Hives    Burning sensation, flushing to skin  Other Reaction(s): Other (See Comments)   Topamax  [Topiramate ] Other (See Comments)    Developed kidney stones   Fluorouracil Swelling    Burning skin    FAMILY HISTORY: Family History  Problem Relation Age of Onset   Hypertension Mother    Thyroid  disease Mother    Diabetes Mother    Hyperlipidemia Mother    Stroke Mother    Heart disease Mother    Hyperlipidemia Father    Hypertension Father    Heart attack Father    Heart failure Father    Dementia Maternal Aunt    Dementia Paternal Aunt    Colon cancer Neg Hx       Objective:  *** General: No acute distress.  Patient appears well-groomed.   Head:  Normocephalic/atraumatic Neck:  Supple.  No paraspinal tenderness.  Full range of motion. Heart:  Regular rate and rhythm. Neuro:  Alert and oriented.  Speech fluent and not dysarthric.  Language intact.  CN II-XII intact.  Bulk and tone normal.  Muscle strength 5/5 throughout.  Sensation to light touch intact.  Deep tendon reflexes 2+ throughout, toes downgoing.  Gait normal.  Romberg negative.  Juliene Dunnings, DO  CC: Jacqulyn Ahle, DO

## 2024-05-31 ENCOUNTER — Ambulatory Visit: Payer: Medicaid Other | Admitting: Neurology

## 2024-05-31 ENCOUNTER — Telehealth: Payer: Self-pay

## 2024-05-31 ENCOUNTER — Encounter: Payer: Self-pay | Admitting: Neurology

## 2024-05-31 VITALS — BP 107/70 | HR 85 | Ht 63.0 in | Wt 176.0 lb

## 2024-05-31 DIAGNOSIS — M542 Cervicalgia: Secondary | ICD-10-CM | POA: Diagnosis not present

## 2024-05-31 DIAGNOSIS — G43009 Migraine without aura, not intractable, without status migrainosus: Secondary | ICD-10-CM | POA: Diagnosis not present

## 2024-05-31 MED ORDER — EMGALITY 120 MG/ML ~~LOC~~ SOAJ: 120.0000 mg | SUBCUTANEOUS | 11 refills | Status: AC

## 2024-05-31 NOTE — Telephone Encounter (Signed)
 Samples of this drug were given to the patient, quantity 2 boxes, Lot Number [id number]  04/2027

## 2024-05-31 NOTE — Patient Instructions (Signed)
 X-ray of cervical spine Emgality  every 4 weeks At earliest onset of headache, take Nurtec (1 tablet daily as needed).  Let me know if it works Limit use of pain relievers to no more than 9 days out of the month to prevent risk of rebound or medication-overuse headache. Keep headache diary

## 2024-06-05 DIAGNOSIS — F332 Major depressive disorder, recurrent severe without psychotic features: Secondary | ICD-10-CM | POA: Diagnosis not present

## 2024-06-05 DIAGNOSIS — F410 Panic disorder [episodic paroxysmal anxiety] without agoraphobia: Secondary | ICD-10-CM | POA: Diagnosis not present

## 2024-06-05 DIAGNOSIS — F411 Generalized anxiety disorder: Secondary | ICD-10-CM | POA: Diagnosis not present

## 2024-06-18 ENCOUNTER — Telehealth: Payer: Self-pay | Admitting: Neurology

## 2024-06-18 ENCOUNTER — Other Ambulatory Visit: Payer: Self-pay | Admitting: Neurology

## 2024-06-18 MED ORDER — NURTEC 75 MG PO TBDP: 1.0000 | ORAL_TABLET | Freq: Every day | ORAL | 5 refills | Status: AC | PRN

## 2024-06-18 NOTE — Telephone Encounter (Signed)
 Pt called in this afternoon and she would like a prescriptions for Nurtac. Pt stated that she receives some samples when she was in our office last and she likes them.

## 2024-06-18 NOTE — Telephone Encounter (Signed)
 LMOVM for patient,  Script has been sent to [facility]

## 2024-06-19 DIAGNOSIS — F332 Major depressive disorder, recurrent severe without psychotic features: Secondary | ICD-10-CM | POA: Diagnosis not present

## 2024-06-19 DIAGNOSIS — F410 Panic disorder [episodic paroxysmal anxiety] without agoraphobia: Secondary | ICD-10-CM | POA: Diagnosis not present

## 2024-06-19 DIAGNOSIS — F411 Generalized anxiety disorder: Secondary | ICD-10-CM | POA: Diagnosis not present

## 2024-06-20 ENCOUNTER — Telehealth: Payer: Self-pay | Admitting: Neurology

## 2024-06-20 NOTE — Telephone Encounter (Signed)
 Pt called and LM with AN, said she called days ago for samples of her migraine medication. She called pharmacist today and she needs  PA for it. Nurtec medication. She leaves to go out of town Tuesday

## 2024-06-20 NOTE — Telephone Encounter (Signed)
 PA team PA is needed for Nurtec

## 2024-06-21 ENCOUNTER — Other Ambulatory Visit (HOSPITAL_COMMUNITY): Payer: Self-pay

## 2024-06-21 ENCOUNTER — Telehealth: Payer: Self-pay

## 2024-06-21 NOTE — Telephone Encounter (Signed)
 PA request has been Submitted. New Encounter has been or will be created for follow up. For additional info see Pharmacy Prior Auth telephone encounter from 06-21-2024.

## 2024-06-21 NOTE — Telephone Encounter (Signed)
 Pharmacy Patient Advocate Encounter   Received notification from Pt Calls Messages that prior authorization for Nurtec 75MG  dispersible tablets is required/requested.   Insurance verification completed.   The patient is insured through [employer] .   Per test claim: PA required; PA submitted to above mentioned insurance via Latent Key/confirmation #/EOC [id number] Status is pending

## 2024-06-22 NOTE — Telephone Encounter (Signed)
 Pharmacy Patient Advocate Encounter  Received notification from [employer] that Prior Authorization for Nurtec 75MG  dispersible tablets has been APPROVED from 06-21-2024 to 06-21-2025   PA #/Case ID/Reference #: [id number]

## 2024-06-22 NOTE — Telephone Encounter (Signed)
 Patient advised via mychart message and phone call.

## 2024-07-09 ENCOUNTER — Encounter: Payer: Self-pay | Admitting: Orthopedic Surgery

## 2024-07-09 ENCOUNTER — Ambulatory Visit: Admitting: Orthopedic Surgery

## 2024-07-09 VITALS — BP 140/92 | HR 88 | Ht 63.5 in | Wt 180.0 lb

## 2024-07-09 DIAGNOSIS — R202 Paresthesia of skin: Secondary | ICD-10-CM

## 2024-07-09 DIAGNOSIS — G5602 Carpal tunnel syndrome, left upper limb: Secondary | ICD-10-CM

## 2024-07-09 NOTE — Progress Notes (Signed)
 Established patient new problem  58 year old female recently diagnosed with sarcoidosis history of underlying fibromyalgia presents with pain and paresthesias left upper extremity with primary symptoms in the thumb index and long finger somewhat radiating distal to proximal into the forearm associated with dropping things.  She gets relief by hanging her arm over the bed  She is not allowed to take any other anti-inflammatory medications according to her rheumatologist  She reports mild weakness in terms of grip and holding onto things  Past Medical History:  Diagnosis Date   Cancer [facility])    Skin   Complication of anesthesia    woke up during endoscopy and colonoscopy   Depression    Diabetes (HCC)    Fibromyalgia    GERD (gastroesophageal reflux disease)    Hemorrhoids    History of esophageal stricture    2011  peptic w/  dilatation   History of gastritis    2011   History of melanoma excision    2015  left leg/   12-09-2015 back    Hypertension    Microhematuria    Migraines    Nephrolithiasis    left side non-obstructive    Right ureteral stone    Sarcoidosis 07/24/2021   Urgency of urination    Wears contact lenses    Past Surgical History:  Procedure Laterality Date   ABDOMINAL HYSTERECTOMY     BALLOON DILATION N/A 05/03/2022   Procedure: BALLOON DILATION;  Surgeon: Cindie Carlin POUR, DO;  [facility]: [facility];  Service: Endoscopy;  Laterality: N/A;   BIOPSY  11/16/2021   Procedure: BIOPSY;  Surgeon: Cindie Carlin POUR, DO;  Location: [facility];  Service: Endoscopy;;   BIOPSY  05/03/2022   Procedure: BIOPSY;  Surgeon: Cindie Carlin POUR, DO;  Location: [facility];  Service: Endoscopy;;   CARPAL TUNNEL RELEASE Right 10/29/2013   COLONOSCOPY N/A 08/27/2015   Procedure: COLONOSCOPY;  Surgeon: Margo LITTIE Haddock, MD;  Location: [facility];  Service: Endoscopy;  Laterality: N/A;  0830   COLONOSCOPY WITH PROPOFOL  N/A 11/16/2021   Surgeon: Cindie Carlin POUR,  DO;  Nonbleeding internal hemorrhoids, otherwise normal exam.  Recommended 10-year screening colonoscopy.   CYST EXCISION  10/25/2010   right hand   CYSTOSCOPY W/ URETERAL STENT PLACEMENT Right 12/22/2015   Procedure: CYSTOSCOPY WITH RETROGRADE PYELOGRAM/URETERAL STENT PLACEMENT;  Surgeon: Redell Lynwood Napoleon, MD;  Location: [facility];  Service: Urology;  Laterality: Right;   CYSTOSCOPY WITH RETROGRADE PYELOGRAM, URETEROSCOPY AND STENT PLACEMENT Right 01/05/2016   Procedure: CYSTOSCOPY WITH RETROGRADE PYELOGRAM, URETEROSCOPY AND STENT REPLACEMENT;  Surgeon: Redell Lynwood Napoleon, MD;  Location: [facility];  Service: Urology;  Laterality: Right;   DILATION AND CURETTAGE OF UTERUS  03/12/2009   w/  Suction   double balloon enteroscopy     Dr. Myriam at [facility]: no erosions, no evidence of Crohn's disease, path without Crohn's.    EGD with push enteroscopy  02/12/2010    patent distal peptic stricture with diffuse antral erythema, normal D1 and D2    ESOPHAGOGASTRODUODENOSCOPY (EGD) WITH PROPOFOL  N/A 11/16/2021   Surgeon: Cindie Carlin POUR, DO;   Gastritis biopsied, otherwise normal exam.  Pathology with mild chronic gastritis, negative for H. pylori.   ESOPHAGOGASTRODUODENOSCOPY (EGD) WITH PROPOFOL  N/A 05/03/2022   Surgeon: Cindie Carlin POUR, DO;  Normal esophagus s/p empiric dilation, gastritis biopsied.  Pathology with mild chronic, focally active gastritis, negative for H. pylori.   HAND SURGERY     LAPAROSCOPIC ASSISTED VAGINAL  HYSTERECTOMY  10/26/2014   w/  Bilateral Salpingoophorectomy   STONE EXTRACTION WITH BASKET Right 01/05/2016   Procedure: STONE EXTRACTION WITH BASKET;  Surgeon: Redell Lynwood Napoleon, MD;  Location: [facility];  Service: Urology;  Laterality: Right;   TONSILLECTOMY  1998  approx   Allergies  Allergen Reactions   Sulfa Antibiotics Other (See Comments) and Hives    Burning sensation, flushing to skin  Other Reaction(s): Other (See Comments)    Topamax  [Topiramate ] Other (See Comments)    Developed kidney stones   Fluorouracil Swelling    Burning skin    BP (!) 140/92   Pulse 88   Ht 5' 3.5 (1.613 m)   Wt 180 lb (81.6 kg)   LMP 10/26/2014   BMI 31.39 kg/m   Normal appearance  Oriented x 3  Mood pleasant affect normal  Normal gait  Left upper extremity no swelling or tenderness Full range of motion left wrist and hand No instability at the wrist No atrophy Skin normal Excellent pulses and capillary refill Lymph nodes negative in the elbow Decreased sensation thumb index and long finger   Assessment and plan  Carpal tunnel syndrome left upper extremity  Nerve conduction study Brace  Patient already on Lyrica cannot take anti-inflammatories  May benefit from vitamin B6 100 mg twice a day  Return with nerve conduction study and possible preop at that time if positive

## 2024-07-09 NOTE — Patient Instructions (Signed)
 We are referring you to [facility] from [facility] address is [address] [place] The phone number is [phone]  The office will call you with an appointment Dr. Eldonna will do the nerve study  After you get nerve study scheduled call in to make a follow up visit for Dr Margrette to review

## 2024-07-09 NOTE — Progress Notes (Signed)
  Intake history:  Chief Complaint  Patient presents with   Hand Problem     LMP 10/26/2014  There is no height or weight on file to calculate BMI.  Pharmacy? ___[facility] ___________________________________  WHAT ARE WE SEEING YOU FOR TODAY?   left hand(s)  How long has this bothered you? (DOI?DOS?WS?)  Several months left thumb index and middle fingers are the worst   Was there an injury? No  Anticoag.  No  Diabetes Yes  Heart disease prolonged QT wave   Hypertension Yes   SMOKING HX No  Kidney disease No/ has Sarcoidosis   Any ALLERGIES ____________________ Allergies  Allergen Reactions   Sulfa Antibiotics Other (See Comments) and Hives    Burning sensation, flushing to skin  Other Reaction(s): Other (See Comments)   Topamax  [Topiramate ] Other (See Comments)    Developed kidney stones   Fluorouracil Swelling    Burning skin   __________________________   Treatment:  Have you taken:  Tylenol  Yes  Advil  No  Had PT No  Had injection No  Other  _________________________

## 2024-07-12 ENCOUNTER — Ambulatory Visit: Admitting: Family Medicine

## 2024-07-12 ENCOUNTER — Encounter: Payer: Self-pay | Admitting: Family Medicine

## 2024-07-12 VITALS — BP 112/88 | HR 88 | Temp 97.9°F | Ht 63.5 in | Wt 180.0 lb

## 2024-07-12 DIAGNOSIS — T753XXA Motion sickness, initial encounter: Secondary | ICD-10-CM | POA: Diagnosis not present

## 2024-07-12 DIAGNOSIS — E785 Hyperlipidemia, unspecified: Secondary | ICD-10-CM

## 2024-07-12 DIAGNOSIS — Z7985 Long-term (current) use of injectable non-insulin antidiabetic drugs: Secondary | ICD-10-CM | POA: Diagnosis not present

## 2024-07-12 DIAGNOSIS — I10 Essential (primary) hypertension: Secondary | ICD-10-CM | POA: Diagnosis not present

## 2024-07-12 DIAGNOSIS — E119 Type 2 diabetes mellitus without complications: Secondary | ICD-10-CM

## 2024-07-12 DIAGNOSIS — D869 Sarcoidosis, unspecified: Secondary | ICD-10-CM | POA: Diagnosis not present

## 2024-07-12 DIAGNOSIS — Z23 Encounter for immunization: Secondary | ICD-10-CM | POA: Diagnosis not present

## 2024-07-12 MED ORDER — SCOPOLAMINE 1 MG/3DAYS TD PT72
1.0000 | MEDICATED_PATCH | TRANSDERMAL | 0 refills | Status: AC
Start: 2024-07-12 — End: ?

## 2024-07-12 NOTE — Patient Instructions (Signed)
 Lab ordered.   Medication sent in.  Follow up in 6 months.

## 2024-07-13 ENCOUNTER — Ambulatory Visit: Payer: Self-pay | Admitting: Family Medicine

## 2024-07-13 ENCOUNTER — Ambulatory Visit: Admitting: Physical Medicine and Rehabilitation

## 2024-07-13 DIAGNOSIS — R202 Paresthesia of skin: Secondary | ICD-10-CM

## 2024-07-13 DIAGNOSIS — M79642 Pain in left hand: Secondary | ICD-10-CM | POA: Diagnosis not present

## 2024-07-13 DIAGNOSIS — R29898 Other symptoms and signs involving the musculoskeletal system: Secondary | ICD-10-CM | POA: Diagnosis not present

## 2024-07-13 DIAGNOSIS — M25532 Pain in left wrist: Secondary | ICD-10-CM

## 2024-07-13 LAB — LIPID PANEL
Chol/HDL Ratio: 3.1 ratio (ref 0.0–4.4)
Cholesterol, Total: 185 mg/dL (ref 100–199)
HDL: 60 mg/dL (ref >39)
LDL Chol Calc (NIH): 108 mg/dL — ABNORMAL HIGH (ref 0–99)
Triglycerides: 94 mg/dL (ref 0–149)
VLDL Cholesterol Cal: 17 mg/dL (ref 5–40)

## 2024-07-13 NOTE — Progress Notes (Signed)
 Pain Scale   Average Pain 10 Patient advising she has left hand pain radiating to her shoulder and causing numbness and tingling also weakness.        +Driver, -BT, -Dye Allergies.

## 2024-07-15 DIAGNOSIS — T753XXA Motion sickness, initial encounter: Secondary | ICD-10-CM | POA: Insufficient documentation

## 2024-07-15 NOTE — Assessment & Plan Note (Signed)
 Scopolamine  patches sent in for cruise.

## 2024-07-15 NOTE — Assessment & Plan Note (Signed)
 Stable. Continue Losartan.

## 2024-07-15 NOTE — Assessment & Plan Note (Signed)
 Stable

## 2024-07-15 NOTE — Progress Notes (Signed)
 Subjective:  Patient ID: Courtney Hale, female    DOB: 02-Dec-1965  Age: 58 y.o. MRN: [id number]  CC:   Chief Complaint  Patient presents with   6 month follow up    HPI:  58 year old female with a complicated PMH including Sarcoidosis with bone involvement, IBS, HTN, Hepatic steatosis, Anxiety and Depression, HLD, DM-2 presents for follow up.  Patient in need of Tdap and Flu vaccines today. She is amendable.  Needs lipid panel to reassess.  Type 2 Diabetes at goal on Ozempic .  Sarcoidosis stable on Methotrexate  and Humira.   Has cruise planned and needs scopolamine  patches.   HTN stable on Losartan .  Patient Active Problem List   Diagnosis Date Noted   Motion sickness 07/15/2024   Hyperlipidemia 01/10/2024   Prolonged Q-T interval on ECG 10/12/2023   Exocrine pancreatic insufficiency 03/26/2023   Pancreatic lesion 10/14/2022   Hepatic steatosis 10/14/2022   Biliary dyskinesia 10/14/2022   DM (diabetes mellitus), type 2 (HCC) 07/15/2022   Insomnia 07/15/2022   High risk medication use 01/06/2022   Immunosuppression (HCC) 11/16/2021   Sarcoidosis 08/14/2021   Gastroesophageal reflux disease without esophagitis 07/27/2017   Anxiety and depression 12/18/2016   IBS (irritable bowel syndrome) 03/18/2016   Melanoma of skin (HCC) 03/18/2016   Headache, chronic migraine without aura 01/31/2013   Hypertension 01/31/2013    Social Hx   Social History   Socioeconomic History   Marital status: Single    Spouse name: Not on file   Number of children: Not on file   Years of education: Not on file   Highest education level: Not on file  Occupational History   Not on file  Tobacco Use   Smoking status: Never    Passive exposure: Past   Smokeless tobacco: Never  Vaping Use   Vaping status: Never Used  Substance and Sexual Activity   Alcohol use: Not Currently   Drug use: No   Sexual activity: Yes  Other Topics Concern   Not on file  Social History Narrative    Right hand   Living w/ son, 1 story home 5 step   Caffeine coke zero 3-4oz a day   Social Drivers of Corporate investment banker Strain: Low Risk  (06/30/2021)   Overall Financial Resource Strain (CARDIA)    Difficulty of Paying Living Expenses: Not hard at all  Food Insecurity: No Food Insecurity (04/18/2024)   Received from [facility]   Hunger Vital Sign    Within the past 12 months, you worried that your food would run out before you got the money to buy more.: Never true    Within the past 12 months, the food you bought just didn't last and you didn't have money to get more.: Never true  Transportation Needs: No Transportation Needs (04/18/2024)   Received from [facility] - Transportation    In the past 12 months, has lack of transportation kept you from medical appointments or from getting medications?: No    Lack of Transportation (Non-Medical): No  Physical Activity: Sufficiently Active (11/21/2023)   Received from [facility]   Exercise Vital Sign    On average, how many days per week do you engage in moderate to strenuous exercise (like a brisk walk)?: 7 days    On average, how many minutes do you engage in exercise at this level?: 30 min  Stress: Stress Concern Present (11/21/2023)   Received  from [facility] - Occupational Stress Questionnaire    Feeling of Stress : Rather much  Social Connections: Socially Isolated (06/30/2021)   Social Connection and Isolation Panel    Frequency of Communication with Friends and Family: More than three times a week    Frequency of Social Gatherings with Friends and Family: More than three times a week    Attends Religious Services: Never    Database administrator or Organizations: No    Attends Banker Meetings: Never    Marital Status: Never married    Review of Systems Per HPI  Objective:  BP 112/88   Pulse 88   Temp 97.9  F (36.6 C)   Ht 5' 3.5 (1.613 m)   Wt 180 lb (81.6 kg)   LMP 10/26/2014   SpO2 97%   BMI 31.39 kg/m      07/12/2024    1:08 PM 07/09/2024    1:16 PM 05/31/2024   10:34 AM  BP/Weight  Systolic BP 112 140 107  Diastolic BP 88 92 70  Wt. (Lbs) 180 180 176  BMI 31.39 kg/m2 31.39 kg/m2 31.18 kg/m2    Physical Exam Vitals and nursing note reviewed.  Constitutional:      General: She is not in acute distress.    Appearance: Normal appearance.  HENT:     Head: Normocephalic and atraumatic.  Eyes:     General:        Right eye: No discharge.        Left eye: No discharge.     Conjunctiva/sclera: Conjunctivae normal.  Cardiovascular:     Rate and Rhythm: Normal rate and regular rhythm.  Pulmonary:     Effort: Pulmonary effort is normal.     Breath sounds: Normal breath sounds. No wheezing, rhonchi or rales.  Neurological:     Mental Status: She is alert.  Psychiatric:        Mood and Affect: Mood normal.        Behavior: Behavior normal.     Lab Results  Component Value Date   WBC 6.8 02/02/2024   HGB 12.7 02/02/2024   HCT 38.7 02/02/2024   PLT 239 02/02/2024   GLUCOSE 91 02/02/2024   CHOL 185 07/12/2024   TRIG 94 07/12/2024   HDL 60 07/12/2024   LDLCALC 108 (H) 07/12/2024   ALT 16 02/02/2024   AST 15 02/02/2024   NA 136 02/02/2024   K 3.8 02/02/2024   CL 104 02/02/2024   CREATININE 0.70 02/02/2024   BUN 12 02/02/2024   CO2 26 02/02/2024   TSH 0.796 02/24/2023   INR 0.9 07/21/2021   HGBA1C 5.7 (A) 05/03/2024   MICROALBUR 10mg /L 07/29/2022     Assessment & Plan:  Primary hypertension Assessment & Plan: Stable. Continue Losartan .   Immunization due -     Flu vaccine trivalent PF, 6mos and older(Flulaval,Afluria,Fluarix,Fluzone) -     Tdap vaccine greater than or equal to 7yo IM  Hyperlipidemia, unspecified hyperlipidemia type Assessment & Plan: Lipid panel to assess. Continue statin.  Orders: -     Lipid panel  Type 2 diabetes mellitus  without complication, without long-term current use of insulin  [facility]) Assessment & Plan: At goal on Ozempic . Continue.    Sarcoidosis Assessment & Plan: Stable.    Motion sickness, initial encounter Assessment & Plan: Scopolamine  patches sent in for cruise.  Orders: -     Scopolamine ; Place 1 patch (1  mg total) onto the skin every 3 (three) days.  Dispense: 10 patch; Refill: 0    Follow-up:  6 months  Anacristina Steffek Bluford DO [facility]

## 2024-07-15 NOTE — Assessment & Plan Note (Signed)
 At goal on Ozempic . Continue.

## 2024-07-15 NOTE — Assessment & Plan Note (Signed)
 Lipid panel to assess. Continue statin.

## 2024-07-16 DIAGNOSIS — F411 Generalized anxiety disorder: Secondary | ICD-10-CM | POA: Diagnosis not present

## 2024-07-16 DIAGNOSIS — F332 Major depressive disorder, recurrent severe without psychotic features: Secondary | ICD-10-CM | POA: Diagnosis not present

## 2024-07-16 DIAGNOSIS — F410 Panic disorder [episodic paroxysmal anxiety] without agoraphobia: Secondary | ICD-10-CM | POA: Diagnosis not present

## 2024-07-16 NOTE — Procedures (Unsigned)
 EMG & NCV Findings: Evaluation of the left median motor nerve showed prolonged distal onset latency (7.3 ms), reduced amplitude (1.1 mV), and decreased conduction velocity (Elbow-Wrist, 43 m/s).  The left median (across palm) sensory nerve showed prolonged distal peak latency (Wrist, 6.6 ms), reduced amplitude (6.5 V), and prolonged distal peak latency (Palm, 4.3 ms).  All remaining nerves (as indicated in the following tables) were within normal limits.    Needle evaluation of the left abductor pollicis brevis muscle showed increased insertional activity and moderately increased spontaneous activity.  All remaining muscles (as indicated in the following table) showed no evidence of electrical instability.    Impression: The above electrodiagnostic study is ABNORMAL and reveals evidence of a severe left median nerve entrapment at the wrist (carpal tunnel syndrome) affecting sensory and motor components.   There is no significant electrodiagnostic evidence of any other focal nerve entrapment, brachial plexopathy or cervical radiculopathy.   Recommendations: 1.  Follow-up with referring physician. 2.  Continue current management of symptoms. 3.  Suggest surgical evaluation.  ___________________________ Prentice Masters FAAPMR Board Certified, American Board of Physical Medicine and Rehabilitation    Nerve Conduction Studies Anti Sensory Summary Table   Stim Site NR Peak (ms) Norm Peak (ms) P-T Amp (V) Norm P-T Amp Site1 Site2 Delta-P (ms) Dist (cm) Vel (m/s) Norm Vel (m/s)  Left Median Acr Palm Anti Sensory (2nd Digit)  31.7C  Wrist    *6.6 <3.6 *6.5 >10 Wrist Palm 2.3 0.0    Palm    *4.3 <2.0 12.6         Left Radial Anti Sensory (Base 1st Digit)  32.3C  Wrist    1.8 <3.1 23.3  Wrist Base 1st Digit 1.8 0.0    Left Ulnar Anti Sensory (5th Digit)  32.4C  Wrist    3.0 <3.7 23.5 >15.0 Wrist 5th Digit 3.0 14.0 47 >38   Motor Summary Table   Stim Site NR Onset (ms) Norm Onset (ms) O-P Amp  (mV) Norm O-P Amp Site1 Site2 Delta-0 (ms) Dist (cm) Vel (m/s) Norm Vel (m/s)  Left Median Motor (Abd Poll Brev)  32.4C  Wrist    *7.3 <4.2 *1.1 >5 Elbow Wrist 4.6 20.0 *43 >50  Elbow    11.9  1.0         Left Ulnar Motor (Abd Dig Min)  32.6C  Wrist    2.5 <4.2 9.7 >3 B Elbow Wrist 3.0 18.5 62 >53  B Elbow    5.5  9.1  A Elbow B Elbow 1.5 10.0 67 >53  A Elbow    7.0  7.9          EMG   Side Muscle Nerve Root Ins Act Fibs Psw Amp Dur Poly Recrt Int Bruna Comment  Left Abd Poll Brev Median C8-T1 *Incr *2+ *2+ Nml Nml 0 Nml Nml   Left 1stDorInt Ulnar C8-T1 Nml Nml Nml Nml Nml 0 Nml Nml   Left PronatorTeres Median C6-7 Nml Nml Nml Nml Nml 0 Nml Nml   Left Biceps Musculocut C5-6 Nml Nml Nml Nml Nml 0 Nml Nml   Left Deltoid Axillary C5-6 Nml Nml Nml Nml Nml 0 Nml Nml     Nerve Conduction Studies Anti Sensory Left/Right Comparison   Stim Site L Lat (ms) R Lat (ms) L-R Lat (ms) L Amp (V) R Amp (V) L-R Amp (%) Site1 Site2 L Vel (m/s) R Vel (m/s) L-R Vel (m/s)  Median Acr Palm Anti Sensory (2nd Digit)  31.7C  Wrist *6.6   *6.5   Wrist Palm     Palm *4.3   12.6         Radial Anti Sensory (Base 1st Digit)  32.3C  Wrist 1.8   23.3   Wrist Base 1st Digit     Ulnar Anti Sensory (5th Digit)  32.4C  Wrist 3.0   23.5   Wrist 5th Digit 47     Motor Left/Right Comparison   Stim Site L Lat (ms) R Lat (ms) L-R Lat (ms) L Amp (mV) R Amp (mV) L-R Amp (%) Site1 Site2 L Vel (m/s) R Vel (m/s) L-R Vel (m/s)  Median Motor (Abd Poll Brev)  32.4C  Wrist *7.3   *1.1   Elbow Wrist *43    Elbow 11.9   1.0         Ulnar Motor (Abd Dig Min)  32.6C  Wrist 2.5   9.7   B Elbow Wrist 62    B Elbow 5.5   9.1   A Elbow B Elbow 67    A Elbow 7.0   7.9            Waveforms:

## 2024-07-17 ENCOUNTER — Encounter: Payer: Self-pay | Admitting: Physical Medicine and Rehabilitation

## 2024-07-17 NOTE — Progress Notes (Signed)
 Courtney Hale - 58 y.o. female MRN [id number]  Date of birth: February 18, 1966  Office Visit Note: Visit Date: 07/13/2024 PCP: Bluford Jacqulyn MATSU, DO Referred by: Margrette Taft BRAVO, MD  Subjective: Chief Complaint  Patient presents with   Left Hand - Pain, Numbness   HPI: Courtney Hale is a 58 y.o. female who comes in today at the request of Dr. Taft Margrette for evaluation and management of chronic, worsening and severe pain, numbness and tingling in the Left upper extremities.  Patient is Right hand dominant.  She is reporting pretty severe symptoms particularly at night with her left hand going numb and she has to shake her hand with a positive flick sign and gets relief by hanging the hand over the side of the bed.  During the day she is starting to get symptoms now particularly with certain activities with using phone and driving excetra.  She does get symptoms radiate up into the shoulder.  No frank radicular symptoms down the arms.  She does have a history of neck pain.  No right-sided complaints.  She does endorse weakness and some dropping of objects.  She has not noted any discoloration excetra.  Her case is complicated by type 2 diabetes with fairly borderline hemoglobin A1c.  She also has a diagnosis of fibromyalgia.  She cannot take anti-inflammatories do to bowel disease.   I spent more than 30 minutes speaking face-to-face with the patient with 50% of the time in counseling and discussing coordination of care.       Review of Systems  Musculoskeletal:  Positive for joint pain and neck pain.  Neurological:  Positive for tingling and focal weakness.  All other systems reviewed and are negative.  Otherwise per HPI.  Assessment & Plan: Visit Diagnoses:    ICD-10-CM   1. Paresthesia of skin  R20.2 NCV with EMG (electromyography)    2. Pain in left hand  M79.642     3. Pain in left wrist  M25.532     4. Weakness of left hand  R29.898        Plan: Impression: The above  electrodiagnostic study is ABNORMAL and reveals evidence of a severe left median nerve entrapment at the wrist (carpal tunnel syndrome) affecting sensory and motor components.   There is no significant electrodiagnostic evidence of any other focal nerve entrapment, brachial plexopathy or cervical radiculopathy.   Recommendations: 1.  Follow-up with referring physician. 2.  Continue current management of symptoms. 3.  Suggest surgical evaluation.  Meds & Orders: No orders of the defined types were placed in this encounter.   Orders Placed This Encounter  Procedures   NCV with EMG (electromyography)    Follow-up: Return for Taft Margrette, MD.   Procedures: No procedures performed  EMG & NCV Findings: Evaluation of the left median motor nerve showed prolonged distal onset latency (7.3 ms), reduced amplitude (1.1 mV), and decreased conduction velocity (Elbow-Wrist, 43 m/s).  The left median (across palm) sensory nerve showed prolonged distal peak latency (Wrist, 6.6 ms), reduced amplitude (6.5 V), and prolonged distal peak latency (Palm, 4.3 ms).  All remaining nerves (as indicated in the following tables) were within normal limits.    Needle evaluation of the left abductor pollicis brevis muscle showed increased insertional activity and moderately increased spontaneous activity.  All remaining muscles (as indicated in the following table) showed no evidence of electrical instability.    Impression: The above electrodiagnostic study is ABNORMAL and reveals evidence of a  severe left median nerve entrapment at the wrist (carpal tunnel syndrome) affecting sensory and motor components.   There is no significant electrodiagnostic evidence of any other focal nerve entrapment, brachial plexopathy or cervical radiculopathy.   Recommendations: 1.  Follow-up with referring physician. 2.  Continue current management of symptoms. 3.  Suggest surgical  evaluation.  ___________________________ Prentice Masters FAAPMR Board Certified, American Board of Physical Medicine and Rehabilitation    Nerve Conduction Studies Anti Sensory Summary Table   Stim Site NR Peak (ms) Norm Peak (ms) P-T Amp (V) Norm P-T Amp Site1 Site2 Delta-P (ms) Dist (cm) Vel (m/s) Norm Vel (m/s)  Left Median Acr Palm Anti Sensory (2nd Digit)  31.7C  Wrist    *6.6 <3.6 *6.5 >10 Wrist Palm 2.3 0.0    Palm    *4.3 <2.0 12.6         Left Radial Anti Sensory (Base 1st Digit)  32.3C  Wrist    1.8 <3.1 23.3  Wrist Base 1st Digit 1.8 0.0    Left Ulnar Anti Sensory (5th Digit)  32.4C  Wrist    3.0 <3.7 23.5 >15.0 Wrist 5th Digit 3.0 14.0 47 >38   Motor Summary Table   Stim Site NR Onset (ms) Norm Onset (ms) O-P Amp (mV) Norm O-P Amp Site1 Site2 Delta-0 (ms) Dist (cm) Vel (m/s) Norm Vel (m/s)  Left Median Motor (Abd Poll Brev)  32.4C  Wrist    *7.3 <4.2 *1.1 >5 Elbow Wrist 4.6 20.0 *43 >50  Elbow    11.9  1.0         Left Ulnar Motor (Abd Dig Min)  32.6C  Wrist    2.5 <4.2 9.7 >3 B Elbow Wrist 3.0 18.5 62 >53  B Elbow    5.5  9.1  A Elbow B Elbow 1.5 10.0 67 >53  A Elbow    7.0  7.9          EMG   Side Muscle Nerve Root Ins Act Fibs Psw Amp Dur Poly Recrt Int Bruna Comment  Left Abd Poll Brev Median C8-T1 *Incr *2+ *2+ Nml Nml 0 Nml Nml   Left 1stDorInt Ulnar C8-T1 Nml Nml Nml Nml Nml 0 Nml Nml   Left PronatorTeres Median C6-7 Nml Nml Nml Nml Nml 0 Nml Nml   Left Biceps Musculocut C5-6 Nml Nml Nml Nml Nml 0 Nml Nml   Left Deltoid Axillary C5-6 Nml Nml Nml Nml Nml 0 Nml Nml     Nerve Conduction Studies Anti Sensory Left/Right Comparison   Stim Site L Lat (ms) R Lat (ms) L-R Lat (ms) L Amp (V) R Amp (V) L-R Amp (%) Site1 Site2 L Vel (m/s) R Vel (m/s) L-R Vel (m/s)  Median Acr Palm Anti Sensory (2nd Digit)  31.7C  Wrist *6.6   *6.5   Wrist Palm     Palm *4.3   12.6         Radial Anti Sensory (Base 1st Digit)  32.3C  Wrist 1.8   23.3   Wrist Base 1st Digit      Ulnar Anti Sensory (5th Digit)  32.4C  Wrist 3.0   23.5   Wrist 5th Digit 47     Motor Left/Right Comparison   Stim Site L Lat (ms) R Lat (ms) L-R Lat (ms) L Amp (mV) R Amp (mV) L-R Amp (%) Site1 Site2 L Vel (m/s) R Vel (m/s) L-R Vel (m/s)  Median Motor (Abd Poll Brev)  32.4C  Wrist *7.3   *  1.1   Elbow Wrist *43    Elbow 11.9   1.0         Ulnar Motor (Abd Dig Min)  32.6C  Wrist 2.5   9.7   B Elbow Wrist 62    B Elbow 5.5   9.1   A Elbow B Elbow 67    A Elbow 7.0   7.9            Waveforms:            Clinical History: No specialty comments available.   She reports that she has never smoked. She has been exposed to tobacco smoke. She has never used smokeless tobacco.  Recent Labs    10/12/23 1053 05/03/24 0950  HGBA1C 5.2 5.7*    Objective:  VS:  HT:    WT:   BMI:     BP:   HR: bpm  TEMP: ( )  RESP:  Physical Exam Vitals and nursing note reviewed.  Constitutional:      General: She is not in acute distress.    Appearance: Normal appearance. She is well-developed. She is obese. She is not ill-appearing.  HENT:     Head: Normocephalic and atraumatic.  Eyes:     Conjunctiva/sclera: Conjunctivae normal.     Pupils: Pupils are equal, round, and reactive to light.  Cardiovascular:     Rate and Rhythm: Normal rate.     Pulses: Normal pulses.  Pulmonary:     Effort: Pulmonary effort is normal.  Musculoskeletal:        General: Tenderness present. No swelling or deformity.     Right lower leg: No edema.     Left lower leg: No edema.     Comments: Inspection reveals no atrophy of the bilateral APB or FDI or hand intrinsics. There is no swelling, color changes, allodynia or dystrophic changes. There is 5 out of 5 strength in the bilateral wrist extension, finger abduction and long finger flexion.  There is some equivocal light touch dysesthesia in the median nerve distribution on the left. There is a positive Phalen's test on the left. There is a negative  Hoffmann's test bilaterally.  Skin:    General: Skin is warm and dry.     Findings: No erythema or rash.  Neurological:     General: No focal deficit present.     Mental Status: She is alert and oriented to person, place, and time.     Cranial Nerves: No cranial nerve deficit.     Sensory: No sensory deficit.     Motor: No weakness or abnormal muscle tone.     Coordination: Coordination normal.     Gait: Gait normal.  Psychiatric:        Mood and Affect: Mood normal.        Behavior: Behavior normal.     Ortho Exam  Imaging: No results found.  Past Medical/Family/Surgical/Social History: Medications & Allergies reviewed per EMR, new medications updated. Patient Active Problem List   Diagnosis Date Noted   Motion sickness 07/15/2024   Hyperlipidemia 01/10/2024   Prolonged Q-T interval on ECG 10/12/2023   Exocrine pancreatic insufficiency 03/26/2023   Pancreatic lesion 10/14/2022   Hepatic steatosis 10/14/2022   Biliary dyskinesia 10/14/2022   DM (diabetes mellitus), type 2 (HCC) 07/15/2022   Insomnia 07/15/2022   High risk medication use 01/06/2022   Immunosuppression 11/16/2021   Sarcoidosis 08/14/2021   Gastroesophageal reflux disease without esophagitis 07/27/2017   Anxiety and depression 12/18/2016  IBS (irritable bowel syndrome) 03/18/2016   Melanoma of skin (HCC) 03/18/2016   Headache, chronic migraine without aura 01/31/2013   Hypertension 01/31/2013   Past Medical History:  Diagnosis Date   Cancer (HCC)    Skin   Complication of anesthesia    woke up during endoscopy and colonoscopy   Depression    Diabetes (HCC)    Fibromyalgia    GERD (gastroesophageal reflux disease)    Hemorrhoids    History of esophageal stricture    2011  peptic w/  dilatation   History of gastritis    2011   History of melanoma excision    2015  left leg/   12-09-2015 back    Hypertension    Microhematuria    Migraines    Nephrolithiasis    left side non-obstructive     Right ureteral stone    Sarcoidosis 07/24/2021   Urgency of urination    Wears contact lenses    Family History  Problem Relation Age of Onset   Hypertension Mother    Thyroid  disease Mother    Diabetes Mother    Hyperlipidemia Mother    Stroke Mother    Heart disease Mother    Hyperlipidemia Father    Hypertension Father    Heart attack Father    Heart failure Father    Dementia Maternal Aunt    Dementia Paternal Aunt    Colon cancer Neg Hx    Past Surgical History:  Procedure Laterality Date   ABDOMINAL HYSTERECTOMY     BALLOON DILATION N/A 05/03/2022   Procedure: BALLOON DILATION;  Surgeon: Cindie Carlin POUR, DO;  Location: [facility];  Service: Endoscopy;  Laterality: N/A;   BIOPSY  11/16/2021   Procedure: BIOPSY;  Surgeon: Cindie Carlin POUR, DO;  Location: [facility];  Service: Endoscopy;;   BIOPSY  05/03/2022   Procedure: BIOPSY;  Surgeon: Cindie Carlin POUR, DO;  Location: [facility];  Service: Endoscopy;;   CARPAL TUNNEL RELEASE Right 10/29/2013   COLONOSCOPY N/A 08/27/2015   Procedure: COLONOSCOPY;  Surgeon: Margo LITTIE Haddock, MD;  Location: [facility];  Service: Endoscopy;  Laterality: N/A;  0830   COLONOSCOPY WITH PROPOFOL  N/A 11/16/2021   Surgeon: Cindie Carlin POUR, DO;  Nonbleeding internal hemorrhoids, otherwise normal exam.  Recommended 10-year screening colonoscopy.   CYST EXCISION  10/25/2010   right hand   CYSTOSCOPY W/ URETERAL STENT PLACEMENT Right 12/22/2015   Procedure: CYSTOSCOPY WITH RETROGRADE PYELOGRAM/URETERAL STENT PLACEMENT;  Surgeon: Redell Lynwood Napoleon, MD;  Location: [facility];  Service: Urology;  Laterality: Right;   CYSTOSCOPY WITH RETROGRADE PYELOGRAM, URETEROSCOPY AND STENT PLACEMENT Right 01/05/2016   Procedure: CYSTOSCOPY WITH RETROGRADE PYELOGRAM, URETEROSCOPY AND STENT REPLACEMENT;  Surgeon: Redell Lynwood Napoleon, MD;  Location[facility];  Service: Urology;  Laterality: Right;   DILATION AND CURETTAGE OF UTERUS   03/12/2009   w/  Suction   double balloon enteroscopy     Dr. Myriam at [facility]: no erosions, no evidence of Crohn's disease, path without Crohn's.    EGD with push enteroscopy  02/12/2010    patent distal peptic stricture with diffuse antral erythema, normal D1 and D2    ESOPHAGOGASTRODUODENOSCOPY (EGD) WITH PROPOFOL  N/A 11/16/2021   Surgeon: Cindie Carlin POUR, DO;   Gastritis biopsied, otherwise normal exam.  Pathology with mild chronic gastritis, negative for H. pylori.   ESOPHAGOGASTRODUODENOSCOPY (EGD) WITH PROPOFOL  N/A 05/03/2022   Surgeon: Cindie Carlin POUR, DO;  Normal esophagus s/p empiric dilation, gastritis  biopsied.  Pathology with mild chronic, focally active gastritis, negative for H. pylori.   HAND SURGERY     LAPAROSCOPIC ASSISTED VAGINAL HYSTERECTOMY  10/26/2014   w/  Bilateral Salpingoophorectomy   STONE EXTRACTION WITH BASKET Right 01/05/2016   Procedure: STONE EXTRACTION WITH BASKET;  Surgeon: Redell Lynwood Napoleon, MD;  Location[facility];  Service: Urology;  Laterality: Right;   TONSILLECTOMY  1998  approx   Social History   Occupational History   Not on file  Tobacco Use   Smoking status: Never    Passive exposure: Past   Smokeless tobacco: Never  Vaping Use   Vaping status: Never Used  Substance and Sexual Activity   Alcohol use: Not Currently   Drug use: No   Sexual activity: Yes

## 2024-07-20 ENCOUNTER — Other Ambulatory Visit: Payer: Self-pay

## 2024-07-20 MED ORDER — ROSUVASTATIN CALCIUM 20 MG PO TABS: 20.0000 mg | ORAL_TABLET | Freq: Every day | ORAL | 3 refills | Status: AC

## 2024-08-08 DIAGNOSIS — F410 Panic disorder [episodic paroxysmal anxiety] without agoraphobia: Secondary | ICD-10-CM | POA: Diagnosis not present

## 2024-08-08 DIAGNOSIS — F411 Generalized anxiety disorder: Secondary | ICD-10-CM | POA: Diagnosis not present

## 2024-08-08 DIAGNOSIS — F332 Major depressive disorder, recurrent severe without psychotic features: Secondary | ICD-10-CM | POA: Diagnosis not present

## 2024-08-21 ENCOUNTER — Other Ambulatory Visit: Payer: Self-pay | Admitting: Gastroenterology

## 2024-08-21 DIAGNOSIS — K219 Gastro-esophageal reflux disease without esophagitis: Secondary | ICD-10-CM

## 2024-08-23 NOTE — Telephone Encounter (Signed)
 Needs ov in 2 months.

## 2024-08-25 ENCOUNTER — Other Ambulatory Visit: Payer: Self-pay | Admitting: Gastroenterology

## 2024-08-25 DIAGNOSIS — K219 Gastro-esophageal reflux disease without esophagitis: Secondary | ICD-10-CM

## 2024-08-27 ENCOUNTER — Encounter: Payer: Self-pay | Admitting: Radiology

## 2024-08-27 NOTE — Telephone Encounter (Signed)
 Needs ov in the next 2 months.

## 2024-09-05 DIAGNOSIS — F411 Generalized anxiety disorder: Secondary | ICD-10-CM | POA: Diagnosis not present

## 2024-09-05 DIAGNOSIS — F332 Major depressive disorder, recurrent severe without psychotic features: Secondary | ICD-10-CM | POA: Diagnosis not present

## 2024-09-05 DIAGNOSIS — F410 Panic disorder [episodic paroxysmal anxiety] without agoraphobia: Secondary | ICD-10-CM | POA: Diagnosis not present

## 2024-09-11 ENCOUNTER — Other Ambulatory Visit (HOSPITAL_COMMUNITY): Payer: Self-pay | Admitting: Critical Care Medicine

## 2024-09-11 DIAGNOSIS — D8689 Sarcoidosis of other sites: Secondary | ICD-10-CM | POA: Diagnosis not present

## 2024-09-11 DIAGNOSIS — R918 Other nonspecific abnormal finding of lung field: Secondary | ICD-10-CM | POA: Diagnosis not present

## 2024-09-11 DIAGNOSIS — D869 Sarcoidosis, unspecified: Secondary | ICD-10-CM | POA: Diagnosis not present

## 2024-09-11 DIAGNOSIS — M899 Disorder of bone, unspecified: Secondary | ICD-10-CM | POA: Diagnosis not present

## 2024-09-11 DIAGNOSIS — D84821 Immunodeficiency due to drugs: Secondary | ICD-10-CM | POA: Diagnosis not present

## 2024-09-11 DIAGNOSIS — R590 Localized enlarged lymph nodes: Secondary | ICD-10-CM | POA: Diagnosis not present

## 2024-09-11 DIAGNOSIS — R9431 Abnormal electrocardiogram [ECG] [EKG]: Secondary | ICD-10-CM | POA: Diagnosis not present

## 2024-09-11 DIAGNOSIS — Z87891 Personal history of nicotine dependence: Secondary | ICD-10-CM | POA: Diagnosis not present

## 2024-09-13 ENCOUNTER — Telehealth: Payer: Self-pay

## 2024-09-13 NOTE — Telephone Encounter (Signed)
 Copied from CRM #[id number]. Topic: Clinical - Medication Question >> Sep 13, 2024  4:37 PM Delon DASEN wrote: Reason for CRM: patient asking for medication for nausea - patches not working- [phone]

## 2024-09-14 ENCOUNTER — Other Ambulatory Visit: Payer: Self-pay | Admitting: Family Medicine

## 2024-09-14 MED ORDER — ONDANSETRON 4 MG PO TBDP: 4.0000 mg | ORAL_TABLET | Freq: Three times a day (TID) | ORAL | 0 refills | Status: AC | PRN

## 2024-09-25 ENCOUNTER — Ambulatory Visit (HOSPITAL_COMMUNITY)
Admission: RE | Admit: 2024-09-25 | Discharge: 2024-09-25 | Disposition: A | Source: Ambulatory Visit | Attending: Critical Care Medicine | Admitting: Critical Care Medicine

## 2024-09-25 ENCOUNTER — Ambulatory Visit (HOSPITAL_COMMUNITY)
Admission: RE | Admit: 2024-09-25 | Discharge: 2024-09-25 | Disposition: A | Source: Ambulatory Visit | Attending: Neurology

## 2024-09-25 DIAGNOSIS — R918 Other nonspecific abnormal finding of lung field: Secondary | ICD-10-CM | POA: Insufficient documentation

## 2024-09-25 DIAGNOSIS — D869 Sarcoidosis, unspecified: Secondary | ICD-10-CM | POA: Insufficient documentation

## 2024-09-25 DIAGNOSIS — M542 Cervicalgia: Secondary | ICD-10-CM

## 2024-09-25 DIAGNOSIS — M47812 Spondylosis without myelopathy or radiculopathy, cervical region: Secondary | ICD-10-CM | POA: Diagnosis not present

## 2024-10-02 DIAGNOSIS — F411 Generalized anxiety disorder: Secondary | ICD-10-CM | POA: Diagnosis not present

## 2024-10-02 DIAGNOSIS — F410 Panic disorder [episodic paroxysmal anxiety] without agoraphobia: Secondary | ICD-10-CM | POA: Diagnosis not present

## 2024-10-02 DIAGNOSIS — F332 Major depressive disorder, recurrent severe without psychotic features: Secondary | ICD-10-CM | POA: Diagnosis not present

## 2024-10-03 ENCOUNTER — Ambulatory Visit: Payer: Self-pay | Admitting: Neurology

## 2024-10-03 DIAGNOSIS — R519 Headache, unspecified: Secondary | ICD-10-CM

## 2024-10-03 DIAGNOSIS — M542 Cervicalgia: Secondary | ICD-10-CM

## 2024-10-04 NOTE — Telephone Encounter (Signed)
 Patient advised. PT ordered.

## 2024-10-04 NOTE — Telephone Encounter (Signed)
-----   Message from Juliene Dunnings, DO sent at 10/03/2024  7:15 AM EST ----- X-ray of the cervical spine shows only mild arthritic changes.  For some reason, only 1 view was ordered.  However, it is enough for me to see that there isn't anything going on that would preclude  starting physical therapy.  To address treatment of occipital headaches, I would recommend physical therapy of the neck.   ----- Message ----- From: Interface, Rad Results In Sent: 10/02/2024   7:49 PM EST To: Juliene JONELLE Dunnings, DO

## 2024-11-02 ENCOUNTER — Encounter: Payer: Self-pay | Admitting: Nurse Practitioner

## 2024-11-02 ENCOUNTER — Ambulatory Visit: Admitting: Nurse Practitioner

## 2024-11-02 VITALS — BP 102/78 | HR 73 | Ht 63.5 in | Wt 168.4 lb

## 2024-11-02 DIAGNOSIS — Z7985 Long-term (current) use of injectable non-insulin antidiabetic drugs: Secondary | ICD-10-CM

## 2024-11-02 DIAGNOSIS — E119 Type 2 diabetes mellitus without complications: Secondary | ICD-10-CM | POA: Diagnosis not present

## 2024-11-02 DIAGNOSIS — I10 Essential (primary) hypertension: Secondary | ICD-10-CM

## 2024-11-02 DIAGNOSIS — Z794 Long term (current) use of insulin: Secondary | ICD-10-CM

## 2024-11-02 DIAGNOSIS — E782 Mixed hyperlipidemia: Secondary | ICD-10-CM | POA: Diagnosis not present

## 2024-11-02 LAB — POCT GLYCOSYLATED HEMOGLOBIN (HGB A1C): Hemoglobin A1C: 5.4 % (ref 4.0–5.6)

## 2024-11-02 MED ORDER — SEMAGLUTIDE (1 MG/DOSE) 4 MG/3ML ~~LOC~~ SOPN: 1.0000 mg | PEN_INJECTOR | SUBCUTANEOUS | 1 refills | Status: AC

## 2024-11-02 NOTE — Progress Notes (Signed)
 "                                                                        Endocrinology Follow Up Note       11/02/2024, 10:22 AM   Subjective:    Patient ID: Courtney Hale, female    DOB: [birth year].  Courtney Hale is being seen in follow up after being seen in consultation for management of currently uncontrolled symptomatic diabetes requested by  Cook, Jayce G, DO.   Past Medical History:  Diagnosis Date   Cancer [facility])    Skin   Complication of anesthesia    woke up during endoscopy and colonoscopy   Depression    Diabetes (HCC)    Fibromyalgia    GERD (gastroesophageal reflux disease)    Hemorrhoids    History of esophageal stricture    2011  peptic w/  dilatation   History of gastritis    2011   History of melanoma excision    2015  left leg/   12-09-2015 back    Hypertension    Microhematuria    Migraines    Nephrolithiasis    left side non-obstructive    Right ureteral stone    Sarcoidosis 07/24/2021   Urgency of urination    Wears contact lenses     Past Surgical History:  Procedure Laterality Date   ABDOMINAL HYSTERECTOMY     BALLOON DILATION N/A 05/03/2022   Procedure: BALLOON DILATION;  Surgeon: Cindie Carlin POUR, DO;  [facility];  Service: Endoscopy;  Laterality: N/A;   BIOPSY  11/16/2021   Procedure: BIOPSY;  Surgeon: Cindie Carlin POUR, DO;  [facility];  Service: Endoscopy;;   BIOPSY  05/03/2022   Procedure: BIOPSY;  Surgeon: Cindie Carlin POUR, DO;  [facility];  Service: Endoscopy;;   CARPAL TUNNEL RELEASE Right 10/29/2013   COLONOSCOPY N/A 08/27/2015   Procedure: COLONOSCOPY;  Surgeon: Margo LITTIE Haddock, MD;  [facility];  Service: Endoscopy;  Laterality: N/A;  0830   COLONOSCOPY WITH PROPOFOL  N/A 11/16/2021   Surgeon: Cindie Carlin POUR, DO;  Nonbleeding internal hemorrhoids, otherwise normal exam.  Recommended 10-year screening colonoscopy.   CYST EXCISION  10/25/2010   right hand   CYSTOSCOPY W/  URETERAL STENT PLACEMENT Right 12/22/2015   Procedure: CYSTOSCOPY WITH RETROGRADE PYELOGRAM/URETERAL STENT PLACEMENT;  Surgeon: Redell Lynwood Napoleon, MD;  Location: [facility];  Service: Urology;  Laterality: Right;   CYSTOSCOPY WITH RETROGRADE PYELOGRAM, URETEROSCOPY AND STENT PLACEMENT Right 01/05/2016   Procedure: CYSTOSCOPY WITH RETROGRADE PYELOGRAM, URETEROSCOPY AND STENT REPLACEMENT;  Surgeon: Redell Lynwood Napoleon, MD;  Location: [facility];  Service: Urology;  Laterality: Right;   DILATION AND CURETTAGE OF UTERUS  03/12/2009   w/  Suction   double balloon enteroscopy     Dr. Myriam at [facility]: no erosions, no evidence of Crohn's disease, path without Crohn's.    EGD with push enteroscopy  02/12/2010    patent distal peptic stricture with diffuse antral erythema, normal D1 and D2    ESOPHAGOGASTRODUODENOSCOPY (EGD) WITH PROPOFOL  N/A 11/16/2021   Surgeon: Cindie Carlin POUR, DO;   Gastritis biopsied, otherwise normal exam.  Pathology with mild chronic gastritis, negative for  H. pylori.   ESOPHAGOGASTRODUODENOSCOPY (EGD) WITH PROPOFOL  N/A 05/03/2022   Surgeon: Cindie Carlin POUR, DO;  Normal esophagus s/p empiric dilation, gastritis biopsied.  Pathology with mild chronic, focally active gastritis, negative for H. pylori.   HAND SURGERY     LAPAROSCOPIC ASSISTED VAGINAL HYSTERECTOMY  10/26/2014   w/  Bilateral Salpingoophorectomy   STONE EXTRACTION WITH BASKET Right 01/05/2016   Procedure: STONE EXTRACTION WITH BASKET;  Surgeon: Redell Lynwood Napoleon, MD;  Location: [facility];  Service: Urology;  Laterality: Right;   TONSILLECTOMY  1998  approx    Social History   Socioeconomic History   Marital status: Single    Spouse name: Not on file   Number of children: Not on file   Years of education: Not on file   Highest education level: Not on file  Occupational History   Not on file  Tobacco Use   Smoking status: Never    Passive exposure: Past   Smokeless  tobacco: Never  Vaping Use   Vaping status: Never Used  Substance and Sexual Activity   Alcohol use: Not Currently   Drug use: No   Sexual activity: Yes  Other Topics Concern   Not on file  Social History Narrative   Right hand   Living w/ son, 1 story home 5 step   Caffeine coke zero 3-4oz a day   Social Drivers of Health   Tobacco Use: Low Risk (11/02/2024)   Patient History    Smoking Tobacco Use: Never    Smokeless Tobacco Use: Never    Passive Exposure: Past  Recent Concern: Tobacco Use - Medium Risk (09/11/2024)   Received from [facility]   Patient History    Smoking Tobacco Use: Former    Smokeless Tobacco Use: Never    Passive Exposure: Not on Actuary Strain: Not on file  Food Insecurity: No Food Insecurity (04/18/2024)   Received from [facility]   Epic    Within the past 12 months, you worried that your food would run out before you got the money to buy more.: Never true    Within the past 12 months, the food you bought just didn't last and you didn't have money to get more.: Never true  Transportation Needs: No Transportation Needs (04/18/2024)   Received from [facility] - Transportation    In the past 12 months, has lack of transportation kept you from medical appointments or from getting medications?: No    Lack of Transportation (Non-Medical): No  Physical Activity: Sufficiently Active (11/21/2023)   Received from [facility]   Exercise Vital Sign    On average, how many days per week do you engage in moderate to strenuous exercise (like a brisk walk)?: 7 days    On average, how many minutes do you engage in exercise at this level?: 30 min  Stress: Stress Concern Present (11/21/2023)   Received from [facility] - Occupational Stress Questionnaire    Feeling of Stress : Rather much  Social Connections: Not on file  Depression  (PHQ2-9): Low Risk (07/12/2024)   Depression (PHQ2-9)    PHQ-2 Score: 0  Alcohol Screen: Not on file  Housing: Unknown (03/20/2024)   Received from [facility]   Epic    Unable to Pay for Housing in the Last Year: Not on file    Number of Times  Moved in the Last Year: Not on file    At any time in the past 12 months, were you homeless or living in a shelter (including now)?: No  Utilities: Not on file  Health Literacy: Low Risk (11/21/2023)   Received from [facility]    How often do you need to have someone help you when you read instructions, pamphlets, or other written material from your doctor or pharmacy?: Never    Family History  Problem Relation Age of Onset   Hypertension Mother    Thyroid  disease Mother    Diabetes Mother    Hyperlipidemia Mother    Stroke Mother    Heart disease Mother    Hyperlipidemia Father    Hypertension Father    Heart attack Father    Heart failure Father    Dementia Maternal Aunt    Dementia Paternal Aunt    Colon cancer Neg Hx     Outpatient Encounter Medications as of 11/02/2024  Medication Sig   BD PEN NEEDLE NANO 2ND GEN 32G X 4 MM MISC USE AS DIRECTED   buPROPion (WELLBUTRIN XL) 300 MG 24 hr tablet Take 300 mg by mouth every morning.   busPIRone  (BUSPAR ) 5 MG tablet TAKE 1 TABLET BY MOUTH THREE TIMES DAILY   calcium  carbonate (TUMS - DOSED IN MG ELEMENTAL CALCIUM ) 500 MG chewable tablet Chew 1,500 mg by mouth 2 (two) times daily as needed for indigestion or heartburn.   folic acid  (FOLVITE ) 1 MG tablet SMARTSIG:1 Tablet(s) By Mouth Morning-Night   Galcanezumab -gnlm (EMGALITY ) 120 MG/ML SOAJ Inject 120 mg into the skin every 28 (twenty-eight) days.   HUMIRA, 2 PEN, 40 MG/0.4ML PNKT INJECT 40MG  SUBCUTANEOUSLY EVERY 14 DAYS.   lansoprazole  (PREVACID ) 30 MG capsule TAKE 1 CAPSULE BY MOUTH TWICE DAILY BEFORE A MEAL   losartan  (COZAAR ) 100 MG tablet TAKE 1 TABLET BY MOUTH ONCE DAILY.   methotrexate   (RHEUMATREX) 2.5 MG tablet Take 8 tablets (20 mg total) by mouth once a week.   ondansetron  (ZOFRAN -ODT) 4 MG disintegrating tablet Take 1 tablet (4 mg total) by mouth every 8 (eight) hours as needed for nausea or vomiting.   Pancrelipase , Lip-Prot-Amyl, (ZENPEP ) 60000-189600 units CPEP Take 60,000 Units by mouth 3 (three) times daily with meals. And 1 capsule with snacks. Max of 5 capsules a day.   PARoxetine  (PAXIL ) 10 MG tablet Take 1 tablet by mouth once daily   PARoxetine  (PAXIL ) 40 MG tablet TAKE 1 TABLET BY MOUTH ONCE DAILY IN THE MORNING   polyethylene glycol powder (GLYCOLAX /MIRALAX ) 17 GM/SCOOP powder Take two capfuls daily in 12-16 ounces of water . Once bowel movements are soft, you can drop down to one capful daily.   pregabalin (LYRICA) 25 MG capsule Take by mouth.   Probiotic Product (PROBIOTIC PO) Take 1 capsule by mouth daily.   psyllium (REGULOID) 0.52 g capsule Take 1 capsule by mouth daily.   Rimegepant Sulfate (NURTEC) 75 MG TBDP Take 1 tablet (75 mg total) by mouth daily as needed.   rosuvastatin  (CRESTOR ) 20 MG tablet Take 1 tablet (20 mg total) by mouth daily.   scopolamine  (TRANSDERM-SCOP) 1 MG/3DAYS Place 1 patch (1 mg total) onto the skin every 3 (three) days.   Semaglutide , 1 MG/DOSE, 4 MG/3ML SOPN Inject 1 mg as directed once a week.   traZODone  (DESYREL ) 150 MG tablet Take 0.5 tablets (75 mg total) by mouth at bedtime as needed for sleep.   valACYclovir (VALTREX) 500 MG tablet Take  500 mg by mouth 2 (two) times daily as needed (outbreaks).   zolpidem  (AMBIEN ) 10 MG tablet Take 10 mg by mouth at bedtime.   [DISCONTINUED] Semaglutide ,0.25 or 0.5MG /DOS, (OZEMPIC , 0.25 OR 0.5 MG/DOSE,) 2 MG/3ML SOPN Inject 0.5 mg into the skin once a week.   No facility-administered encounter medications on file as of 11/02/2024.    ALLERGIES: Allergies  Allergen Reactions   Sulfa Antibiotics Other (See Comments) and Hives    Burning sensation, flushing to skin  Other Reaction(s):  Other (See Comments)   Topamax  [Topiramate ] Other (See Comments)    Developed kidney stones   Fluorouracil Swelling    Burning skin    VACCINATION STATUS: Immunization History  Administered Date(s) Administered   Influenza, Seasonal, Injecte, Preservative Fre 09/20/2023, 07/12/2024   Influenza,inj,Quad PF,6+ Mos 09/09/2016, 08/29/2017, 09/27/2019, 10/30/2020, 08/14/2021   Influenza-Unspecified 08/22/2013, 07/22/2014, 08/08/2015   MMR 01/22/2011   PFIZER(Purple Top)SARS-COV-2 Vaccination 12/30/2019, 01/20/2020   PNEUMOCOCCAL CONJUGATE-20 01/10/2024   Tdap 01/22/2011, 04/17/2014, 07/12/2024    Diabetes She presents for her follow-up diabetic visit. She has type 2 diabetes mellitus. Onset time: diagnosed at approx age of 50. Her disease course has been stable. There are no hypoglycemic associated symptoms. Pertinent negatives for diabetes include no weight loss. There are no hypoglycemic complications. There are no diabetic complications. Risk factors for coronary artery disease include diabetes mellitus, dyslipidemia, family history, hypertension and obesity. Current diabetic treatments: Ozempic  only. She is compliant with treatment most of the time. Her weight is fluctuating minimally. She is following a generally healthy diet. When asked about meal planning, she reported none. She has not had a previous visit with a dietitian. She participates in exercise intermittently. (She presents today with no meter or logs to review, she was not asked to routinely monitor glucose due to safe regimen.  Her POCT A1c today is 5.4%, improving from last visit of 5.7%.  She notes overall she is feeling well but notes her sugar cravings are really starting to increase.  She is asking about increasing her Ozempic .) An ACE inhibitor/angiotensin II receptor blocker is being taken. She does not see a podiatrist.Eye exam is current.     Review of systems  Constitutional: + decreasing body weight, current Body  mass index is 29.36 kg/m., no fatigue, no subjective hyperthermia, no subjective hypothermia Eyes: no blurry vision, no xerophthalmia ENT: no sore throat, no nodules palpated in throat, no dysphagia/odynophagia, no hoarseness Cardiovascular: no chest pain, no shortness of breath, no palpitations, no leg swelling Respiratory: no cough, no shortness of breath Gastrointestinal: no nausea/vomiting/diarrhea Musculoskeletal: generalized aches and pains- says r/t sarcoidosis (controlled) Skin: no rashes, no hyperemia Neurological: no tremors, no numbness, no tingling, no dizziness Psychiatric: no depression, no anxiety  Objective:     BP 102/78 (BP Location: Left Arm, Patient Position: Sitting, Cuff Size: Large)   Pulse 73   Ht 5' 3.5 (1.613 m)   Wt 168 lb 6.4 oz (76.4 kg)   LMP 10/26/2014   BMI 29.36 kg/m   Wt Readings from Last 3 Encounters:  11/02/24 168 lb 6.4 oz (76.4 kg)  07/12/24 180 lb (81.6 kg)  07/09/24 180 lb (81.6 kg)     BP Readings from Last 3 Encounters:  11/02/24 102/78  07/12/24 112/88  07/09/24 (!) 140/92      Physical Exam- Limited  Constitutional:  Body mass index is 29.36 kg/m. , not in acute distress, normal state of mind Eyes:  EOMI, no exophthalmos Musculoskeletal: no gross deformities, strength intact  in all four extremities, no gross restriction of joint movements Skin:  no rashes, no hyperemia Neurological: no tremor with outstretched hands  Diabetic Foot Exam - Simple   No data filed     CMP ( most recent) CMP     Component Value Date/Time   NA 136 02/02/2024 1934   NA 140 11/25/2023 1205   K 3.8 02/02/2024 1934   CL 104 02/02/2024 1934   CO2 26 02/02/2024 1934   GLUCOSE 91 02/02/2024 1934   BUN 12 02/02/2024 1934   BUN 15 11/25/2023 1205   CREATININE 0.70 02/02/2024 1934   CREATININE 0.66 07/15/2022 1511   CALCIUM  9.0 02/02/2024 1934   PROT 6.8 02/02/2024 2104   PROT 6.5 11/25/2023 1205   ALBUMIN 3.9 02/02/2024 2104   ALBUMIN  4.4 11/25/2023 1205   AST 15 02/02/2024 2104   ALT 16 02/02/2024 2104   ALKPHOS 86 02/02/2024 2104   BILITOT 0.6 02/02/2024 2104   BILITOT 0.6 11/25/2023 1205   GFRNONAA >60 02/02/2024 1934   GFRAA 121 08/27/2020 1517     Diabetic Labs (most recent): Lab Results  Component Value Date   HGBA1C 5.4 11/02/2024   HGBA1C 5.7 (A) 05/03/2024   HGBA1C 5.2 10/12/2023   MICROALBUR 10mg /L 07/29/2022     Lipid Panel ( most recent) Lipid Panel     Component Value Date/Time   CHOL 185 07/12/2024 1346   TRIG 94 07/12/2024 1346   HDL 60 07/12/2024 1346   CHOLHDL 3.1 07/12/2024 1346   LDLCALC 108 (H) 07/12/2024 1346   LABVLDL 17 07/12/2024 1346      Lab Results  Component Value Date   TSH 0.796 02/24/2023   TSH 0.93 07/15/2022   TSH 0.996 08/27/2020   TSH 1.220 06/09/2016   TSH 0.785 08/13/2015   FREET4 1.36 02/24/2023           Assessment & Plan:   1) Controlled type 2 diabetes mellitus without complication  She presents today with no meter or logs to review, she was not asked to routinely monitor glucose due to safe regimen.  Her POCT A1c today is 5.4%, improving from last visit of 5.7%.  She notes overall she is feeling well but notes her sugar cravings are really starting to increase.  She is asking about increasing her Ozempic .  - Courtney Hale has currently uncontrolled symptomatic type 2 DM since 59 years of age.   -Recent labs reviewed.  - I had a long discussion with her about the progressive nature of diabetes and the pathology behind its complications. -her diabetes is complicated by chronic steroid use due to sarcoidosis and she remains at a high risk for more acute and chronic complications which include CAD, CVA, CKD, retinopathy, and neuropathy. These are all discussed in detail with her.  The following Lifestyle Medicine recommendations according to American College of Lifestyle Medicine [facility]) were discussed and offered to patient and she agrees to start  the journey:  A. Whole Foods, Plant-based plate comprising of fruits and vegetables, plant-based proteins, whole-grain carbohydrates was discussed in detail with the patient.   A list for source of those nutrients were also provided to the patient.  Patient will use only water  or unsweetened tea for hydration. B.  The need to stay away from risky substances including alcohol, smoking; obtaining 7 to 9 hours of restorative sleep, at least 150 minutes of moderate intensity exercise weekly, the importance of healthy social connections,  and stress reduction techniques were discussed.  C.  A full color page of  Calorie density of various food groups per pound showing examples of each food groups was provided to the patient.  - Nutritional counseling repeated/built upon at each appointment.  - The patient admits there is a room for improvement in their diet and drink choices. -  Suggestion is made for the patient to avoid simple carbohydrates from their diet including Cakes, Sweet Desserts / Pastries, Ice Cream, Soda (diet and regular), Sweet Tea, Candies, Chips, Cookies, Sweet Pastries, Store Bought Juices, Alcohol in Excess of 1-2 drinks a day, Artificial Sweeteners, Coffee Creamer, and Sugar-free Products. This will help patient to have stable blood glucose profile and potentially avoid unintended weight gain.   - I encouraged the patient to switch to unprocessed or minimally processed complex starch and increased protein intake (animal or plant source), fruits, and vegetables.   - Patient is advised to stick to a routine mealtimes to eat 3 meals a day and avoid unnecessary snacks (to snack only to correct hypoglycemia).  - she sees Santana Duke, RDN, CDE for diabetes education.  - I have approached her with the following individualized plan to manage her diabetes and patient agrees:   -Will increase her Ozempic  to 1 mg SQ weekly.  She has maintained excellent control of her diabetes on this  medication, thus is to continue.    -She does not need to monitor glucose routinely, however, may find it helpful to check fasting glucose 1-2 times per week to keep an eye on things.  - She is allergic to sulfa meds.  - Specific targets for  A1c; LDL, HDL, and Triglycerides were discussed with the patient.  2) Blood Pressure /Hypertension:  her blood pressure is controlled to target.   she is advised to continue her current medications including Losartan  100 mg p.o. daily with breakfast and HCTZ 25 mg po daily.  3) Lipids/Hyperlipidemia:    Review of her recent lipid panel from 07/12/24 showed uncontrolled LDL at 108 (improved).  She is on Crestor  20 mg po daily per PCP, advised to continue.  4)  Weight/Diet:  her Body mass index is 29.36 kg/m.  -  clearly complicating her diabetes care.   she is a candidate for weight loss. I discussed with her the fact that loss of 5 - 10% of her  current body weight will have the most impact on her diabetes management.  Exercise, and detailed carbohydrates information provided  -  detailed on discharge instructions.  5) Vitamin D  deficiency Currently on OTC vitamin D3 supplement.  Recent vitamin D  level from 02/24/23 was 80.2.  She can stop her vitamin d  supplement for now.  6) Chronic Care/Health Maintenance: -she is on ACEI/ARB and not on Statin medications and is encouraged to initiate and continue to follow up with Ophthalmology, Dentist, Podiatrist at least yearly or according to recommendations, and advised to stay away from smoking. I have recommended yearly flu vaccine and pneumonia vaccine at least every 5 years; moderate intensity exercise for up to 150 minutes weekly; and sleep for at least 7 hours a day.  - she is advised to maintain close follow up with Cook, Jayce G, DO for primary care needs, as well as her other providers for optimal and coordinated care.     I spent  28  minutes in the care of the patient today including review of labs  from CMP, Lipids, Thyroid  Function, Hematology (current and previous including abstractions from other facilities); face-to-face time  discussing  her blood glucose readings/logs, discussing hypoglycemia and hyperglycemia episodes and symptoms, medications doses, her options of short and long term treatment based on the latest standards of care / guidelines;  discussion about incorporating lifestyle medicine;  and documenting the encounter. Risk reduction counseling performed per USPSTF guidelines to reduce obesity and cardiovascular risk factors.     Please refer to Patient Instructions for Blood Glucose Monitoring and Insulin /Medications Dosing Guide  in media tab for additional information. Please  also refer to  Patient Self Inventory in the Media  tab for reviewed elements of pertinent patient history.  Courtney Hale participated in the discussions, expressed understanding, and voiced agreement with the above plans.  All questions were answered to her satisfaction. she is encouraged to contact clinic should she have any questions or concerns prior to her return visit.    Follow up plan: - Return in about 6 months (around 05/02/2025) for Diabetes F/U with A1c in office, No previsit labs, Bring meter and logs.  Benton Rio, [facility] [address] [place], [place] [address] Phone: [phone] Fax: [phone]  11/02/2024, 10:22 AM    "

## 2024-11-19 NOTE — Therapy (Addendum)
 " OUTPATIENT PHYSICAL THERAPY CERVICAL EVALUATION   Patient Name: Courtney Hale MRN: [id number] DOB:07/26/1966, 59 y.o., female Today's Date: 11/23/2024  END OF SESSION:  PT End of Session - 11/23/24 0901     Visit Number 1    Authorization Type [place] MEDICAID PREPAID HEALTH PLAN [place] MEDICAID HEALTHY BLUE    Authorization Time Period please check auth    PT Start Time 0900    PT Stop Time 0940    PT Time Calculation (min) 40 min    Activity Tolerance Patient tolerated treatment well    Behavior During Therapy [facility] for tasks assessed/performed          Past Medical History:  Diagnosis Date   Cancer (HCC)    Skin   Complication of anesthesia    woke up during endoscopy and colonoscopy   Depression    Diabetes (HCC)    Fibromyalgia    GERD (gastroesophageal reflux disease)    Hemorrhoids    History of esophageal stricture    2011  peptic w/  dilatation   History of gastritis    2011   History of melanoma excision    2015  left leg/   12-09-2015 back    Hypertension    Microhematuria    Migraines    Nephrolithiasis    left side non-obstructive    Right ureteral stone    Sarcoidosis 07/24/2021   Urgency of urination    Wears contact lenses    Past Surgical History:  Procedure Laterality Date   ABDOMINAL HYSTERECTOMY     BALLOON DILATION N/A 05/03/2022   Procedure: BALLOON DILATION;  Surgeon: Cindie Carlin POUR, DO;  [facility];  Service: Endoscopy;  Laterality: N/A;   BIOPSY  11/16/2021   Procedure: BIOPSY;  Surgeon: Cindie Carlin POUR, DO;  [facility];  Service: Endoscopy;;   BIOPSY  05/03/2022   Procedure: BIOPSY;  Surgeon: Cindie Carlin POUR, DO;  [facility];  Service: Endoscopy;;   CARPAL TUNNEL RELEASE Right 10/29/2013   COLONOSCOPY N/A 08/27/2015   Procedure: COLONOSCOPY;  Surgeon: Margo LITTIE Haddock, MD;  [facility];  Service: Endoscopy;  Laterality: N/A;  0830   COLONOSCOPY WITH PROPOFOL  N/A 11/16/2021    Surgeon: Cindie Carlin POUR, DO;  Nonbleeding internal hemorrhoids, otherwise normal exam.  Recommended 10-year screening colonoscopy.   CYST EXCISION  10/25/2010   right hand   CYSTOSCOPY W/ URETERAL STENT PLACEMENT Right 12/22/2015   Procedure: CYSTOSCOPY WITH RETROGRADE PYELOGRAM/URETERAL STENT PLACEMENT;  Surgeon: Redell Lynwood Napoleon, MD;  Location: [facility];  Service: Urology;  Laterality: Right;   CYSTOSCOPY WITH RETROGRADE PYELOGRAM, URETEROSCOPY AND STENT PLACEMENT Right 01/05/2016   Procedure: CYSTOSCOPY WITH RETROGRADE PYELOGRAM, URETEROSCOPY AND STENT REPLACEMENT;  Surgeon: Redell Lynwood Napoleon, MD;  Location: [facility];  Service: Urology;  Laterality: Right;   DILATION AND CURETTAGE OF UTERUS  03/12/2009   w/  Suction   double balloon enteroscopy     Dr. Myriam at [facility]: no erosions, no evidence of Crohn's disease, path without Crohn's.    EGD with push enteroscopy  02/12/2010    patent distal peptic stricture with diffuse antral erythema, normal D1 and D2    ESOPHAGOGASTRODUODENOSCOPY (EGD) WITH PROPOFOL  N/A 11/16/2021   Surgeon: Cindie Carlin POUR, DO;   Gastritis biopsied, otherwise normal exam.  Pathology with mild chronic gastritis, negative for H. pylori.   ESOPHAGOGASTRODUODENOSCOPY (EGD) WITH PROPOFOL  N/A 05/03/2022   Surgeon: Cindie Carlin POUR, DO;  Normal  esophagus s/p empiric dilation, gastritis biopsied.  Pathology with mild chronic, focally active gastritis, negative for H. pylori.   HAND SURGERY     LAPAROSCOPIC ASSISTED VAGINAL HYSTERECTOMY  10/26/2014   w/  Bilateral Salpingoophorectomy   STONE EXTRACTION WITH BASKET Right 01/05/2016   Procedure: STONE EXTRACTION WITH BASKET;  Surgeon: Redell Lynwood Napoleon, MD;  Location: [facility];  Service: Urology;  Laterality: Right;   TONSILLECTOMY  1998  approx   Patient Active Problem List   Diagnosis Date Noted   Motion sickness 07/15/2024   Hyperlipidemia 01/10/2024   Prolonged Q-T interval  on ECG 10/12/2023   Exocrine pancreatic insufficiency 03/26/2023   Pancreatic lesion 10/14/2022   Hepatic steatosis 10/14/2022   Biliary dyskinesia 10/14/2022   DM (diabetes mellitus), type 2 (HCC) 07/15/2022   Insomnia 07/15/2022   High risk medication use 01/06/2022   Immunosuppression 11/16/2021   Sarcoidosis 08/14/2021   Gastroesophageal reflux disease without esophagitis 07/27/2017   Anxiety and depression 12/18/2016   IBS (irritable bowel syndrome) 03/18/2016   Melanoma of skin (HCC) 03/18/2016   Headache, chronic migraine without aura 01/31/2013   Hypertension 01/31/2013    PCP: Bluford Jacqulyn MATSU, DO  REFERRING PROVIDER: Skeet Juliene SAUNDERS, DO  REFERRING DIAG: R51.9 (ICD-10-CM) - Occipital headache M54.2 (ICD-10-CM) - Cervicalgia  THERAPY DIAG:  Neck pain - Plan: PT plan of care cert/re-cert  Abnormal posture - Plan: PT plan of care cert/re-cert  Chronic intractable headache, unspecified headache type - Plan: PT plan of care cert/re-cert  Rationale for Evaluation and Treatment: Rehabilitation  ONSET DATE: neck pain about a year  SUBJECTIVE:                                                                                                                                                                                                         SUBJECTIVE STATEMENT: Migraines for years; neck pain for about a year; did x-ray and referred for therapy.  X-ray clear.  Headaches a pretty much constant but ranges from mild to severe. She is getting shots for her migraines now every 28 days and that is helping some.  Sometimes her shoulders and knees hurt.   Hand dominance: Right  PERTINENT HISTORY:  Migraines ever since my 20's Sarcodosis 2022  PAIN:  Are you having pain? Yes: NPRS scale: 4/10 neck; 2/10 headache Pain location: neck and headache Pain description: achy, throbbing, pulling Aggravating factors: unknown Relieving factors: stretching, take medication  PRECAUTIONS:  None  RED FLAGS: None     WEIGHT BEARING RESTRICTIONS: No  FALLS:  Has  patient fallen in last 6 months? No  OCCUPATION: not working since 2022  PLOF: Independent  PATIENT GOALS: pain relief  NEXT MD VISIT: April 7th  OBJECTIVE:  Note: Objective measures were completed at Evaluation unless otherwise noted.  DIAGNOSTIC FINDINGS:  CLINICAL DATA:  Cervicalgia   EXAM: DG CERVICAL SPINE - 1 VIEW   COMPARISON:  None Available.   FINDINGS: Single frontal view of the cervical spine was obtained. Alignment appears grossly anatomic on this single limited view. No gross fracture. Mild diffuse disc space narrowing. Soft tissues appear unremarkable. Lung apices are clear.   IMPRESSION: 1. Extremely limited evaluation of the cervical spine with only a single frontal view obtained. 2. No obvious bony abnormality.  Mild spondylosis.    PATIENT SURVEYS:  NDI:  NECK DISABILITY INDEX  Date: 11/23/2024 Score                                Total 28/50; 56%   Minimum Detectable Change (90% confidence): 5 points or 10% points  COGNITION: Overall cognitive status: Within functional limits for tasks assessed  SENSATION: WFL  POSTURE: rounded shoulders, forward head, and increased thoracic kyphosis  PALPATION: Tender bilateral upper traps and cervical spine paraspinals   CERVICAL ROM:   Active ROM AROM (deg) eval  Flexion full  Extension 39  Right lateral flexion 30  Left lateral flexion 21  Right rotation 60  Left rotation 65   (Blank rows = not tested)  UPPER EXTREMITY ROM:  Active ROM Right eval Left eval  Shoulder flexion wfl wfl  Shoulder extension    Shoulder abduction    Shoulder adduction    Shoulder extension    Shoulder internal rotation    Shoulder external rotation    Elbow flexion    Elbow extension    Wrist flexion    Wrist extension    Wrist ulnar deviation    Wrist radial deviation    Wrist pronation    Wrist supination      (Blank rows = not tested)  UPPER EXTREMITY MMT:  MMT Right eval Left eval  Shoulder flexion 5 5  Shoulder extension    Shoulder abduction    Shoulder adduction    Shoulder extension    Shoulder internal rotation    Shoulder external rotation    Middle trapezius    Lower trapezius    Elbow flexion    Elbow extension    Wrist flexion    Wrist extension    Wrist ulnar deviation    Wrist radial deviation    Wrist pronation    Wrist supination    Grip strength     (Blank rows = not tested)  TREATMENT DATE: 11/23/2024 physical therapy evaluation and HEP instruction  PATIENT EDUCATION:  Education details: Patient educated on exam findings, POC, scope of PT, HEP, and what to expect next visit. Person educated: Patient Education method: Explanation, Demonstration, and Handouts Education comprehension: verbalized understanding, returned demonstration, verbal cues required, and tactile cues required   HOME EXERCISE PROGRAM: Access Code: [email or web address] URL: [email or web address] Date: 12-19-24 Prepared by: [facility]  Exercises - Seated Upper Trapezius Stretch  - 2 x daily - 7 x weekly - 1 sets - 5 reps - 20 sec hold - Seated Scapular Retraction  - 2 x daily - 7 x weekly - 1 sets - 10 reps - 5 sec hold - Correct Seated Posture  - 1 x daily - 7 x weekly - 1 sets - 1 reps  ASSESSMENT:  CLINICAL IMPRESSION: Patient is a 58 y.o. female who was seen today for physical therapy evaluation and treatment for R51.9 (ICD-10-CM) - Occipital headache M54.2 (ICD-10-CM) - Cervicalgia.  Patient demonstrates decreased strength, ROM restriction, reduced flexibility, increased tenderness to palpation and postural abnormalities which are likely contributing to symptoms of pain and are negatively impacting patient ability to perform ADLs. Patient will benefit  from skilled physical therapy services to address these deficits to reduce pain and improve level of function with ADLs   OBJECTIVE IMPAIRMENTS: decreased activity tolerance, decreased ROM, increased fascial restrictions, impaired perceived functional ability, increased muscle spasms, postural dysfunction, and pain.   ACTIVITY LIMITATIONS: carrying, lifting, sitting, bathing, dressing, reach over head, and caring for others  PARTICIPATION LIMITATIONS: meal prep, cleaning, laundry, driving, and shopping  EHAB POTENTIAL: Good  CLINICAL DECISION MAKING: Evolving/moderate complexity  EVALUATION COMPLEXITY: Moderate   GOALS: Goals reviewed with patient? No  SHORT TERM GOALS: Target date: 12/07/2024  patient will be independent with initial HEP and compliant with HEP 3-4 times a week   Baseline:  Goal status: INITIAL  2.  Patient will report 50% improvement overall  Baseline:  Goal status: INITIAL   LONG TERM GOALS: Target date: 12/21/2024  Patient will be independent in self management strategies to improve quality of life and functional outcomes. Baseline:  Goal status: INITIAL  2.  Patient will report 70% improvement overall  Baseline:  Goal status: INITIAL  3.  Patient will improve NDI score by 8 points to demonstrate improved perceived function  Baseline: 28/50 Goal status: INITIAL  4.  Patient will increase cervical mobility by 20 degrees throughout to improve ability to scan for safety with driving  Baseline: see above Goal status: INITIAL   PLAN:  PT FREQUENCY: 2x/week  PT DURATION: 4 weeks  PLANNED INTERVENTIONS: 97164- PT Re-evaluation, 97110-Therapeutic exercises, 97530- Therapeutic activity, 97112- Neuromuscular re-education, 97535- Self Care, [id number]- Manual therapy, [id number]- Gait training, [phone]- Orthotic Fit/training, [phone]- Canalith repositioning, [id number]- Aquatic Therapy, 97760- Splinting, [phone]- Wound care (first 20 sq cm), 97598- Wound care (each  additional 20 sq cm)Patient/Family education, Balance training, Stair training, Taping, Dry Needling, Joint mobilization, Joint manipulation, Spinal manipulation, Spinal mobilization, Scar mobilization, and DME instructions.   PLAN FOR NEXT SESSION: Review HEP and goals; cervical mobility; postural strengthening; manual as needed  9:44 AM, 12-19-2024 Wretha Laris Small Jaret Coppedge MPT [place] [facility] [place] [phone] Ph:[phone]   Managed Medicaid Authorization Request Treatment Start Date: Dec 19, 2024  Visit Dx Codes: M54.2, R29.3, R51.9  Functional Tool Score: NDI 28/50; 56%  For all possible CPT codes, reference the Planned Interventions line above.     Check all conditions that are expected to impact treatment: {Conditions expected to impact treatment:None of these apply  If treatment provided at initial evaluation, no treatment charged due to lack of authorization.        "

## 2024-11-23 ENCOUNTER — Other Ambulatory Visit: Payer: Self-pay

## 2024-11-23 ENCOUNTER — Ambulatory Visit (HOSPITAL_COMMUNITY): Attending: Neurology

## 2024-11-23 DIAGNOSIS — R519 Headache, unspecified: Secondary | ICD-10-CM | POA: Insufficient documentation

## 2024-11-23 DIAGNOSIS — M542 Cervicalgia: Secondary | ICD-10-CM | POA: Insufficient documentation

## 2024-11-23 DIAGNOSIS — G8929 Other chronic pain: Secondary | ICD-10-CM | POA: Insufficient documentation

## 2024-11-23 DIAGNOSIS — R293 Abnormal posture: Secondary | ICD-10-CM | POA: Diagnosis present

## 2024-11-27 ENCOUNTER — Encounter (HOSPITAL_COMMUNITY): Payer: Self-pay

## 2024-11-27 ENCOUNTER — Ambulatory Visit (HOSPITAL_COMMUNITY)

## 2024-11-27 NOTE — Progress Notes (Signed)
 Patient rescheduled for pharmacy visit per provider request.   Future Appointments     Date/Time Provider Department Center Visit Type   12/18/2024 1:00 PM (Arrive by 12:30 PM) Courtney Hale, CPP [facility] PHARMACOTHERAPY   04/24/2025 1:00 PM (Arrive by 12:30 PM) Courtney Vola Bucco, MD [facility] RHEUM SARCOIDOSIS RETURN   08/26/2025 9:00 AM (Arrive by 8:40 AM) DMP CT 2 [facility] CT DMP CT CHEST ILD FU WO CON W MIPS   08/26/2025 10:00 AM (Arrive by 9:30 AM) 40 MIN, PULM LAB [facility] SPIROMETRY/DLCO/LUNG VOLUMES   08/26/2025 10:40 AM (Arrive by 10:10 AM) Orlene Fish, MD [facility] RETURN ILD

## 2024-12-03 ENCOUNTER — Ambulatory Visit (HOSPITAL_COMMUNITY)

## 2024-12-05 ENCOUNTER — Ambulatory Visit (HOSPITAL_COMMUNITY)

## 2024-12-11 ENCOUNTER — Ambulatory Visit (HOSPITAL_COMMUNITY)

## 2024-12-13 ENCOUNTER — Ambulatory Visit (HOSPITAL_COMMUNITY)

## 2024-12-17 ENCOUNTER — Ambulatory Visit (HOSPITAL_COMMUNITY): Admitting: Physical Therapy

## 2024-12-19 ENCOUNTER — Ambulatory Visit (HOSPITAL_COMMUNITY)

## 2024-12-25 ENCOUNTER — Ambulatory Visit (HOSPITAL_COMMUNITY)

## 2025-01-09 ENCOUNTER — Ambulatory Visit: Admitting: Family Medicine

## 2025-01-29 ENCOUNTER — Ambulatory Visit: Admitting: Neurology

## 2025-05-02 ENCOUNTER — Ambulatory Visit: Admitting: Nurse Practitioner
